# Patient Record
Sex: Male | Born: 1950
Health system: Southern US, Community
[De-identification: ages and names within clinical notes are randomized; demographics above are authoritative.]

## PROBLEM LIST (undated history)

## (undated) DIAGNOSIS — E7800 Pure hypercholesterolemia, unspecified: Secondary | ICD-10-CM

## (undated) DIAGNOSIS — I10 Essential (primary) hypertension: Secondary | ICD-10-CM

## (undated) DIAGNOSIS — K219 Gastro-esophageal reflux disease without esophagitis: Secondary | ICD-10-CM

## (undated) DIAGNOSIS — E785 Hyperlipidemia, unspecified: Secondary | ICD-10-CM

## (undated) DIAGNOSIS — C61 Malignant neoplasm of prostate: Principal | ICD-10-CM

## (undated) DIAGNOSIS — M549 Dorsalgia, unspecified: Secondary | ICD-10-CM

## (undated) DIAGNOSIS — Z800 Family history of malignant neoplasm of digestive organs: Secondary | ICD-10-CM

## (undated) DIAGNOSIS — F1020 Alcohol dependence, uncomplicated: Secondary | ICD-10-CM

## (undated) DIAGNOSIS — N62 Hypertrophy of breast: Secondary | ICD-10-CM

## (undated) DIAGNOSIS — K760 Fatty (change of) liver, not elsewhere classified: Secondary | ICD-10-CM

## (undated) DIAGNOSIS — R739 Hyperglycemia, unspecified: Secondary | ICD-10-CM

## (undated) DIAGNOSIS — G8929 Other chronic pain: Secondary | ICD-10-CM

## (undated) DIAGNOSIS — K5790 Diverticulosis of intestine, part unspecified, without perforation or abscess without bleeding: Secondary | ICD-10-CM

## (undated) DIAGNOSIS — Z8669 Personal history of other diseases of the nervous system and sense organs: Secondary | ICD-10-CM

## (undated) DIAGNOSIS — C7951 Secondary malignant neoplasm of bone: Secondary | ICD-10-CM

## (undated) DIAGNOSIS — Z87828 Personal history of other (healed) physical injury and trauma: Secondary | ICD-10-CM

## (undated) DIAGNOSIS — M1990 Unspecified osteoarthritis, unspecified site: Secondary | ICD-10-CM

## (undated) HISTORY — DX: Malignant neoplasm of prostate: C61

## (undated) HISTORY — DX: Hypertrophy of breast: N62

## (undated) HISTORY — DX: Hyperglycemia, unspecified: R73.9

## (undated) HISTORY — DX: Alcohol dependence, uncomplicated: F10.20

## (undated) HISTORY — DX: Personal history of other diseases of the nervous system and sense organs: Z86.69

## (undated) HISTORY — DX: Fatty (change of) liver, not elsewhere classified: K76.0

## (undated) HISTORY — DX: Family history of malignant neoplasm of digestive organs: Z80.0

## (undated) HISTORY — DX: Personal history of other (healed) physical injury and trauma: Z87.828

## (undated) HISTORY — DX: Pure hypercholesterolemia, unspecified: E78.00

## (undated) HISTORY — PX: ESOPHAGOGASTRODUODENOSCOPY: SHX1529

## (undated) HISTORY — PX: HAND SURGERY: SHX662

## (undated) HISTORY — DX: Essential (primary) hypertension: I10

## (undated) HISTORY — PX: KNEE SURGERY: SHX244

## (undated) HISTORY — DX: Unspecified osteoarthritis, unspecified site: M19.90

## (undated) HISTORY — DX: Hyperlipidemia, unspecified: E78.5

## (undated) HISTORY — DX: Gastro-esophageal reflux disease without esophagitis: K21.9

---

## 1985-12-12 HISTORY — PX: ANKLE SURGERY: SHX546

## 2003-05-20 ENCOUNTER — Encounter: Payer: Self-pay | Admitting: Emergency Medicine

## 2003-05-20 ENCOUNTER — Emergency Department (HOSPITAL_COMMUNITY): Admission: EM | Admit: 2003-05-20 | Discharge: 2003-05-20 | Payer: Self-pay | Admitting: Emergency Medicine

## 2004-01-29 ENCOUNTER — Ambulatory Visit (HOSPITAL_COMMUNITY): Admission: RE | Admit: 2004-01-29 | Discharge: 2004-01-29 | Payer: Self-pay | Admitting: Pulmonary Disease

## 2004-07-21 ENCOUNTER — Ambulatory Visit (HOSPITAL_COMMUNITY): Admission: RE | Admit: 2004-07-21 | Discharge: 2004-07-21 | Payer: Self-pay | Admitting: Pulmonary Disease

## 2004-09-25 ENCOUNTER — Emergency Department (HOSPITAL_COMMUNITY): Admission: EM | Admit: 2004-09-25 | Discharge: 2004-09-26 | Payer: Self-pay | Admitting: Emergency Medicine

## 2004-11-10 ENCOUNTER — Emergency Department (HOSPITAL_COMMUNITY): Admission: EM | Admit: 2004-11-10 | Discharge: 2004-11-10 | Payer: Self-pay | Admitting: *Deleted

## 2005-10-12 HISTORY — PX: PROSTATE BIOPSY: SHX241

## 2005-11-02 ENCOUNTER — Ambulatory Visit (HOSPITAL_COMMUNITY): Admission: RE | Admit: 2005-11-02 | Discharge: 2005-11-02 | Payer: Self-pay | Admitting: Pulmonary Disease

## 2005-11-21 ENCOUNTER — Encounter (HOSPITAL_COMMUNITY): Admission: RE | Admit: 2005-11-21 | Discharge: 2005-11-21 | Payer: Self-pay | Admitting: Internal Medicine

## 2006-01-03 ENCOUNTER — Ambulatory Visit: Admission: RE | Admit: 2006-01-03 | Discharge: 2006-01-19 | Payer: Self-pay | Admitting: Radiation Oncology

## 2006-03-06 ENCOUNTER — Ambulatory Visit: Admission: RE | Admit: 2006-03-06 | Discharge: 2006-06-04 | Payer: Self-pay | Admitting: Radiation Oncology

## 2006-11-08 ENCOUNTER — Observation Stay (HOSPITAL_COMMUNITY): Admission: RE | Admit: 2006-11-08 | Discharge: 2006-11-09 | Payer: Self-pay | Admitting: General Surgery

## 2006-11-08 ENCOUNTER — Encounter (INDEPENDENT_AMBULATORY_CARE_PROVIDER_SITE_OTHER): Payer: Self-pay | Admitting: Specialist

## 2006-12-12 HISTORY — PX: COLONOSCOPY: SHX174

## 2007-02-08 ENCOUNTER — Ambulatory Visit: Payer: Self-pay | Admitting: Internal Medicine

## 2007-02-19 ENCOUNTER — Ambulatory Visit: Payer: Self-pay | Admitting: Internal Medicine

## 2007-02-19 ENCOUNTER — Ambulatory Visit (HOSPITAL_COMMUNITY): Admission: RE | Admit: 2007-02-19 | Discharge: 2007-02-19 | Payer: Self-pay | Admitting: Internal Medicine

## 2007-06-29 ENCOUNTER — Emergency Department (HOSPITAL_COMMUNITY): Admission: EM | Admit: 2007-06-29 | Discharge: 2007-06-29 | Payer: Self-pay | Admitting: Emergency Medicine

## 2007-07-06 ENCOUNTER — Emergency Department (HOSPITAL_COMMUNITY): Admission: EM | Admit: 2007-07-06 | Discharge: 2007-07-06 | Payer: Self-pay | Admitting: Emergency Medicine

## 2007-11-01 ENCOUNTER — Ambulatory Visit (HOSPITAL_COMMUNITY): Admission: RE | Admit: 2007-11-01 | Discharge: 2007-11-01 | Payer: Self-pay | Admitting: Pulmonary Disease

## 2008-02-22 ENCOUNTER — Ambulatory Visit (HOSPITAL_COMMUNITY): Admission: RE | Admit: 2008-02-22 | Discharge: 2008-02-22 | Payer: Self-pay | Admitting: Internal Medicine

## 2008-06-23 ENCOUNTER — Ambulatory Visit (HOSPITAL_COMMUNITY): Admission: RE | Admit: 2008-06-23 | Discharge: 2008-06-23 | Payer: Self-pay | Admitting: Pulmonary Disease

## 2008-09-01 ENCOUNTER — Ambulatory Visit (HOSPITAL_COMMUNITY): Admission: RE | Admit: 2008-09-01 | Discharge: 2008-09-01 | Payer: Self-pay | Admitting: Pulmonary Disease

## 2008-11-11 LAB — HM COLONOSCOPY

## 2008-12-12 HISTORY — PX: HERNIA REPAIR: SHX51

## 2009-09-11 ENCOUNTER — Ambulatory Visit (HOSPITAL_COMMUNITY): Admission: RE | Admit: 2009-09-11 | Discharge: 2009-09-11 | Payer: Self-pay | Admitting: Pulmonary Disease

## 2009-09-11 ENCOUNTER — Encounter: Payer: Self-pay | Admitting: Orthopedic Surgery

## 2009-10-05 ENCOUNTER — Telehealth: Payer: Self-pay | Admitting: Orthopedic Surgery

## 2009-10-05 ENCOUNTER — Ambulatory Visit: Payer: Self-pay | Admitting: Orthopedic Surgery

## 2009-10-05 DIAGNOSIS — S139XXA Sprain of joints and ligaments of unspecified parts of neck, initial encounter: Secondary | ICD-10-CM | POA: Insufficient documentation

## 2009-10-05 DIAGNOSIS — M5020 Other cervical disc displacement, unspecified cervical region: Secondary | ICD-10-CM | POA: Insufficient documentation

## 2009-10-06 ENCOUNTER — Encounter: Payer: Self-pay | Admitting: Orthopedic Surgery

## 2009-11-02 ENCOUNTER — Ambulatory Visit: Payer: Self-pay | Admitting: Orthopedic Surgery

## 2009-11-03 ENCOUNTER — Encounter: Payer: Self-pay | Admitting: Orthopedic Surgery

## 2009-11-10 ENCOUNTER — Ambulatory Visit (HOSPITAL_COMMUNITY): Admission: RE | Admit: 2009-11-10 | Discharge: 2009-11-10 | Payer: Self-pay | Admitting: Orthopedic Surgery

## 2009-11-17 ENCOUNTER — Encounter (INDEPENDENT_AMBULATORY_CARE_PROVIDER_SITE_OTHER): Payer: Self-pay | Admitting: *Deleted

## 2009-11-17 ENCOUNTER — Encounter: Payer: Self-pay | Admitting: Orthopedic Surgery

## 2009-11-18 ENCOUNTER — Telehealth: Payer: Self-pay | Admitting: Orthopedic Surgery

## 2009-11-19 ENCOUNTER — Telehealth: Payer: Self-pay | Admitting: Orthopedic Surgery

## 2009-11-19 ENCOUNTER — Encounter: Payer: Self-pay | Admitting: Orthopedic Surgery

## 2009-12-14 ENCOUNTER — Encounter: Payer: Self-pay | Admitting: Orthopedic Surgery

## 2009-12-15 ENCOUNTER — Encounter: Admission: RE | Admit: 2009-12-15 | Discharge: 2009-12-15 | Payer: Self-pay | Admitting: Neurosurgery

## 2010-07-06 ENCOUNTER — Encounter (HOSPITAL_COMMUNITY): Admission: RE | Admit: 2010-07-06 | Discharge: 2010-08-05 | Payer: Self-pay | Admitting: Oncology

## 2010-07-06 ENCOUNTER — Ambulatory Visit (HOSPITAL_COMMUNITY): Payer: Self-pay | Admitting: Oncology

## 2010-07-20 ENCOUNTER — Ambulatory Visit (HOSPITAL_COMMUNITY): Payer: Self-pay | Admitting: Oncology

## 2010-10-12 ENCOUNTER — Encounter (HOSPITAL_COMMUNITY)
Admission: RE | Admit: 2010-10-12 | Discharge: 2010-11-11 | Payer: Self-pay | Source: Home / Self Care | Admitting: Oncology

## 2010-10-12 ENCOUNTER — Ambulatory Visit (HOSPITAL_COMMUNITY): Payer: Self-pay | Admitting: Oncology

## 2010-11-15 ENCOUNTER — Ambulatory Visit: Payer: Self-pay | Admitting: Internal Medicine

## 2010-11-19 ENCOUNTER — Ambulatory Visit (HOSPITAL_COMMUNITY)
Admission: RE | Admit: 2010-11-19 | Discharge: 2010-11-19 | Payer: Self-pay | Source: Home / Self Care | Attending: Internal Medicine | Admitting: Internal Medicine

## 2010-11-19 ENCOUNTER — Ambulatory Visit: Payer: Self-pay | Admitting: Internal Medicine

## 2010-11-30 ENCOUNTER — Encounter (HOSPITAL_COMMUNITY)
Admission: RE | Admit: 2010-11-30 | Discharge: 2010-12-30 | Payer: Self-pay | Source: Home / Self Care | Attending: Oncology | Admitting: Oncology

## 2010-11-30 ENCOUNTER — Ambulatory Visit (HOSPITAL_COMMUNITY): Payer: Self-pay | Admitting: Oncology

## 2010-12-31 ENCOUNTER — Encounter (HOSPITAL_COMMUNITY)
Admission: RE | Admit: 2010-12-31 | Discharge: 2011-01-11 | Payer: Self-pay | Source: Home / Self Care | Attending: Oncology | Admitting: Oncology

## 2011-01-01 ENCOUNTER — Encounter: Payer: Self-pay | Admitting: Pulmonary Disease

## 2011-01-11 LAB — PSA: PSA: 5.64 ng/mL — ABNORMAL HIGH (ref <=4.00)

## 2011-01-11 NOTE — Consult Note (Signed)
Summary: Consult [facility] Dr Jeral Fruit   Imported By: Cammie Sickle 01/07/2010 09:10:00  _____________________________________________________________________  External Attachment:    Type:   Image     Comment:   External Document

## 2011-01-24 ENCOUNTER — Emergency Department (HOSPITAL_COMMUNITY)
Admission: EM | Admit: 2011-01-24 | Discharge: 2011-01-24 | Disposition: A | Discharge status: Home / Self Care | Payer: PRIVATE HEALTH INSURANCE | Attending: Emergency Medicine | Admitting: Emergency Medicine

## 2011-01-24 DIAGNOSIS — Z8546 Personal history of malignant neoplasm of prostate: Secondary | ICD-10-CM | POA: Insufficient documentation

## 2011-01-24 DIAGNOSIS — I10 Essential (primary) hypertension: Secondary | ICD-10-CM | POA: Insufficient documentation

## 2011-01-24 DIAGNOSIS — R319 Hematuria, unspecified: Secondary | ICD-10-CM | POA: Insufficient documentation

## 2011-01-24 DIAGNOSIS — Z79899 Other long term (current) drug therapy: Secondary | ICD-10-CM | POA: Insufficient documentation

## 2011-01-24 LAB — URINALYSIS, ROUTINE W REFLEX MICROSCOPIC
Bilirubin Urine: NEGATIVE
Ketones, ur: NEGATIVE mg/dL
Leukocytes, UA: NEGATIVE
Nitrite: NEGATIVE
Protein, ur: NEGATIVE mg/dL
Specific Gravity, Urine: 1.02 (ref 1.005–1.030)
Urine Glucose, Fasting: NEGATIVE mg/dL
Urobilinogen, UA: 0.2 mg/dL (ref 0.0–1.0)
pH: 5 (ref 5.0–8.0)

## 2011-01-24 LAB — POCT I-STAT, CHEM 8
BUN: 25 mg/dL — ABNORMAL HIGH (ref 6–23)
Calcium, Ion: 1.17 mmol/L (ref 1.12–1.32)
Chloride: 103 meq/L (ref 96–112)
Creatinine, Ser: 1 mg/dL (ref 0.4–1.5)
Glucose, Bld: 89 mg/dL (ref 70–99)
HCT: 39 % (ref 39.0–52.0)
Hemoglobin: 13.3 g/dL (ref 13.0–17.0)
Potassium: 3.7 meq/L (ref 3.5–5.1)
Sodium: 137 mEq/L (ref 135–145)
TCO2: 27 mmol/L (ref 0–100)

## 2011-01-24 LAB — URINE MICROSCOPIC-ADD ON

## 2011-01-26 LAB — URINE CULTURE
Colony Count: NO GROWTH
Culture  Setup Time: 201202142328
Culture: NO GROWTH

## 2011-02-01 ENCOUNTER — Encounter (HOSPITAL_COMMUNITY): Payer: PRIVATE HEALTH INSURANCE | Attending: Oncology

## 2011-02-01 ENCOUNTER — Other Ambulatory Visit (HOSPITAL_COMMUNITY): Payer: PRIVATE HEALTH INSURANCE

## 2011-02-01 DIAGNOSIS — C61 Malignant neoplasm of prostate: Secondary | ICD-10-CM

## 2011-02-08 ENCOUNTER — Ambulatory Visit (HOSPITAL_COMMUNITY): Payer: Self-pay | Admitting: Oncology

## 2011-02-08 ENCOUNTER — Ambulatory Visit (HOSPITAL_COMMUNITY): Payer: PRIVATE HEALTH INSURANCE | Admitting: Oncology

## 2011-02-08 DIAGNOSIS — C61 Malignant neoplasm of prostate: Secondary | ICD-10-CM

## 2011-02-21 LAB — PSA: PSA: 5.71 ng/mL — ABNORMAL HIGH (ref <=4.00)

## 2011-02-22 LAB — PSA: PSA: 5 ng/mL — ABNORMAL HIGH (ref <=4.00)

## 2011-02-26 LAB — COMPREHENSIVE METABOLIC PANEL
ALT: 47 U/L (ref 0–53)
AST: 31 U/L (ref 0–37)
Alkaline Phosphatase: 55 U/L (ref 39–117)
CO2: 25 mEq/L (ref 19–32)
Chloride: 104 mEq/L (ref 96–112)
GFR calc Af Amer: >60 mL/min (ref >60)
GFR calc non Af Amer: >60 mL/min (ref >60)
Glucose, Bld: 124 mg/dL — ABNORMAL HIGH (ref 70–99)
Potassium: 3.1 mEq/L — ABNORMAL LOW (ref 3.5–5.1)
Sodium: 138 mEq/L (ref 135–145)

## 2011-02-26 LAB — CBC
HCT: 41.9 % (ref 39.0–52.0)
Hemoglobin: 14.3 g/dL (ref 13.0–17.0)
RBC: 4.5 MIL/uL (ref 4.22–5.81)
WBC: 7.9 10*3/uL (ref 4.0–10.5)

## 2011-02-26 LAB — LACTATE DEHYDROGENASE: LDH: 180 U/L (ref 94–250)

## 2011-03-01 ENCOUNTER — Encounter (HOSPITAL_COMMUNITY): Payer: PRIVATE HEALTH INSURANCE | Attending: Oncology

## 2011-03-01 ENCOUNTER — Other Ambulatory Visit (HOSPITAL_COMMUNITY): Payer: PRIVATE HEALTH INSURANCE

## 2011-03-01 DIAGNOSIS — C61 Malignant neoplasm of prostate: Secondary | ICD-10-CM | POA: Insufficient documentation

## 2011-03-22 ENCOUNTER — Emergency Department (HOSPITAL_COMMUNITY): Payer: PRIVATE HEALTH INSURANCE

## 2011-03-22 ENCOUNTER — Emergency Department (HOSPITAL_COMMUNITY)
Admission: EM | Admit: 2011-03-22 | Discharge: 2011-03-22 | Disposition: A | Discharge status: Home / Self Care | Payer: PRIVATE HEALTH INSURANCE | Attending: Emergency Medicine | Admitting: Emergency Medicine

## 2011-03-22 DIAGNOSIS — R9431 Abnormal electrocardiogram [ECG] [EKG]: Secondary | ICD-10-CM | POA: Insufficient documentation

## 2011-03-22 DIAGNOSIS — I10 Essential (primary) hypertension: Secondary | ICD-10-CM | POA: Insufficient documentation

## 2011-03-22 DIAGNOSIS — R079 Chest pain, unspecified: Secondary | ICD-10-CM | POA: Insufficient documentation

## 2011-03-22 DIAGNOSIS — Z8546 Personal history of malignant neoplasm of prostate: Secondary | ICD-10-CM | POA: Insufficient documentation

## 2011-03-22 DIAGNOSIS — R091 Pleurisy: Secondary | ICD-10-CM | POA: Insufficient documentation

## 2011-03-22 LAB — D-DIMER, QUANTITATIVE: D-Dimer, Quant: 0.45 ug/mL-FEU (ref 0.00–0.48)

## 2011-03-22 LAB — DIFFERENTIAL
Basophils Absolute: 0.1 10*3/uL (ref 0.0–0.1)
Eosinophils Relative: 2 % (ref 0–5)
Lymphs Abs: 3.1 10*3/uL (ref 0.7–4.0)
Monocytes Absolute: 0.8 10*3/uL (ref 0.1–1.0)

## 2011-03-22 LAB — BASIC METABOLIC PANEL
BUN: 27 mg/dL — ABNORMAL HIGH (ref 6–23)
Calcium: 10.3 mg/dL (ref 8.4–10.5)
GFR calc non Af Amer: >60 mL/min (ref >60)
Glucose, Bld: 84 mg/dL (ref 70–99)
Potassium: 4.3 mEq/L (ref 3.5–5.1)
Sodium: 138 mEq/L (ref 135–145)

## 2011-03-22 LAB — CBC
Hemoglobin: 12.9 g/dL — ABNORMAL LOW (ref 13.0–17.0)
MCH: 32.2 pg (ref 26.0–34.0)
Platelets: 419 10*3/uL — ABNORMAL HIGH (ref 150–400)
RBC: 4.01 MIL/uL — ABNORMAL LOW (ref 4.22–5.81)
WBC: 9.4 10*3/uL (ref 4.0–10.5)

## 2011-03-22 LAB — POCT CARDIAC MARKERS
CKMB, poc: 1.2 ng/mL (ref 1.0–8.0)
Myoglobin, poc: 73.1 ng/mL (ref 12–200)
Troponin i, poc: <0.05 ng/mL (ref 0.00–0.09)

## 2011-03-28 ENCOUNTER — Encounter (HOSPITAL_COMMUNITY): Payer: PRIVATE HEALTH INSURANCE | Attending: Oncology

## 2011-03-28 DIAGNOSIS — C61 Malignant neoplasm of prostate: Secondary | ICD-10-CM | POA: Insufficient documentation

## 2011-03-29 ENCOUNTER — Encounter (HOSPITAL_COMMUNITY): Payer: PRIVATE HEALTH INSURANCE | Admitting: Oncology

## 2011-03-29 DIAGNOSIS — C61 Malignant neoplasm of prostate: Secondary | ICD-10-CM

## 2011-04-26 ENCOUNTER — Other Ambulatory Visit (HOSPITAL_COMMUNITY): Payer: Self-pay | Admitting: Oncology

## 2011-04-26 ENCOUNTER — Encounter (HOSPITAL_COMMUNITY): Payer: PRIVATE HEALTH INSURANCE | Attending: Oncology

## 2011-04-26 DIAGNOSIS — C61 Malignant neoplasm of prostate: Secondary | ICD-10-CM

## 2011-04-26 LAB — COMPREHENSIVE METABOLIC PANEL
ALT: 54 U/L — ABNORMAL HIGH (ref 0–53)
AST: 36 U/L (ref 0–37)
Albumin: 3.7 g/dL (ref 3.5–5.2)
Alkaline Phosphatase: 47 U/L (ref 39–117)
BUN: 22 mg/dL (ref 6–23)
Chloride: 100 mEq/L (ref 96–112)
Potassium: 4.5 mEq/L (ref 3.5–5.1)
Sodium: 139 mEq/L (ref 135–145)
Total Bilirubin: 0.4 mg/dL (ref 0.3–1.2)
Total Protein: 6.7 g/dL (ref 6.0–8.3)

## 2011-04-29 NOTE — Op Note (Signed)
Walter Boone, Walter Boone            ACCOUNT NO.:  [id number]   MEDICAL RECORD NO.:  [id number]          PATIENT TYPE:  AMB   LOCATION:  DAY                           FACILITY:  [facility]   PHYSICIAN:  Lionel December, M.D.    DATE OF BIRTH:  1951/05/18   DATE OF PROCEDURE:  02/19/2007  DATE OF DISCHARGE:                               OPERATIVE REPORT   PROCEDURE:  Colonoscopy.   INDICATIONS:  This is a 60 year old Hispanic American male with  intermittent hematochezia and perianal burning.  It is suspected that he  has radiation proctitis and has had partial response to symptomatic  therapy.  He has history of prostate carcinoma and finished radiation  therapy in July 2007.  Furthermore his family history is positive for  colon carcinoma in two siblings.  This procedure and risks were reviewed  with the patient and informed consent was obtained.   MEDICATIONS FOR CONSCIOUS SEDATION:  Benzocaine spray for pharyngeal  topical anesthesia, Demerol 50 mg IV, Versed 8 mg IV in divided dose.   FINDINGS:  Procedure performed in endoscopy suite.  The patient's vital  signs and O2 sat were monitored during the procedure and remained  stable.  The patient was placed in the left lateral position and rectal  examination performed.  No abnormality noted on external or digital  exam.  Pentax videoscope was placed in the rectum and advanced under  vision into sigmoid colon beyond.  Preparation was excellent.  There  were a few small diverticula noted at sigmoid colon.  Scope was easily  passed into the cecum which was identified by appendiceal orifice and  ileocecal valve.  As the scope was withdrawn the colonic mucosa was  carefully examined. Pictures taken for the record.  Short segment of TI  was also examined and was normal.  As the scope was withdrawn colonic  mucosa was carefully examined and was normal throughout.  Mucosa of  proximal rectum was normal.  Distally there was a large patch with  pale  mucosa with telangiectasia.  However, there was no ulceration noted.  Anorectal junction appeared to be unremarkable.  Pictures were taken for  the record.  Endoscope was then straightened and withdrawn.  The patient  tolerated the procedure well.   FINAL DIAGNOSIS:  Mild distal proctitis felt to be the source of the  patient's intermittent hematochezia.  A few small diverticula at sigmoid  colon.  Normal to terminal ileum.   RECOMMENDATIONS:  High-fiber diet plus fiber supplement 3-4 grams daily.  He can use Mycolog cream or Anusol-HC suppository on p.r.n. basis.  Should he need to use one of the other prep, he should use at least for  one week at a time.   He should return for another screening exam in 5 years from now given  positive family history.      Lionel December, M.D.  Electronically Signed     NR/MEDQ  D:  02/19/2007  T:  02/19/2007  Job:  [id number]   cc:   Maryln Gottron, M.D.  Fax: [phone]   Dr. Juanetta Gosling

## 2011-04-29 NOTE — Consult Note (Signed)
NAMEVENCE, LALOR            ACCOUNT NO.:  [id number]   MEDICAL RECORD NO.:  [id number]          PATIENT TYPE:  AMB   LOCATION:                                FACILITY:  [facility]   PHYSICIAN:  Lionel December, M.D.    DATE OF BIRTH:  1951/01/30   DATE OF CONSULTATION:  02/08/2007  DATE OF DISCHARGE:                                 CONSULTATION   CONSULTING PHYSICIAN:  Chipper Herb, M.D.   PRIMARY CARE PHYSICIAN:  Edward L. Juanetta Gosling, M.D.   PRESENTING COMPLAINT:  Rectal bleeding, rectal burning and pruritus ani.   Positive family history of colon carcinoma.   HISTORY OF PRESENT ILLNESS:  Jedediah is a 60 year old Hispanic male who  is referred through the courtesy of Dr. Chipper Herb for GI evaluation.  Last year he had an insurance physical and was noted to have a PSA of  around 30.  He was seen by Dr. Rito Ehrlich and was diagnosed with stage-IIC  prostate carcinoma.  This was treated with radiation therapy which she  completed on June 23, 2006.  Ever since he has had intermittent rectal  burning and itching, and he also has had hematochezia.  On two occasions  he noted water in the commode colored red.  He denies abdominal pain,  diarrhea or constipation.  On most days he has soft formed stools.  He  remains with good appetite and his weight is stable.  He has been having  vasomotor symptoms with [name] for which he is using megestrol on p.r.n.  basis.  He denies heartburn, dysphagia, nausea or vomiting.  He states  his sister had colon carcinoma at age 43 and died at age 44, but  apparently he has never had a colonoscopy.  His other sister who lives  in [place] was diagnosed with colon carcinoma but apparently has not been  treated.  I asked him to call her and make sure this diagnosis is  correct.  The patient states that his PSA has been coming down, and the  last was down to 4.4.   He is on Prostate Essentials Plus b.i.d. (OTC), Hyzaar 100/25 daily,  Flomax 0.4 mg daily,  megestrol AC 40 mg daily p.r.n. hydrocortisone  cream or suppository p.r.n. and [name] injection every 3 months that is  being supervised by Dr. Dennie Maizes   PAST MEDICAL HISTORY:  Has been hypertensive for 5-6 years.  His blood  pressure has been well controlled.  History of hyperlipidemia.  He was  on Lipitor, but he could not afford all these medicines.  He plans to  see Dr. Juanetta Gosling and go back on this medicine.  Maybe he could try an  alternative.  History of prostate carcinoma treated with radiation  therapy as above.  He had surgery on his left knee for what appears to  be infection secondary to foreign body over 20 years ago, and he had a  bone removed from his left ankle about 20 years ago (accessory bone).  He had a right inguinal herniorrhaphy in September 2007.   ALLERGIES:  NO KNOWN ALLERGIES.   FAMILY HISTORY:  Father was diabetic and had peripheral vascular disease  and died at age 32.  Mother is doing fair.  She has hypertension and  diabetes.  Karlin has 7 sisters and 1 brother living.  One sister died  of colon carcinoma at age 45 as above and a sister recently diagnosed  with colon carcinoma with details sketchy, apparently has declined  therapy.  One brother died of intracranial hemorrhage at age 5, and he  has one brother with CAD.   SOCIAL HISTORY:  He is married.  He has 4 children, one is 18 years old,  the others are grown up.  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  He smoked  2 to 2-1/2 packs of cigarettes per day for 10 years but quit over 15  years ago.  Similarly, he quit drinking alcohol at the same time.  He  used to drink too much on weekends.   PHYSICAL EXAMINATION:  Pleasant, well-developed, mildly obese, Hispanic  male who is in no acute distress.  He weighs 212-1/2 pounds.  He is 5  feet 5 inches tall.  Pulse 92 per minute, blood pressure 128/80,  temperature is 97.8.   Conjunctivae is pink.  Sclerae is nonicteric.  Oropharyngeal mucosa is normal.  He has a partial upper plate.  No neck  masses or thyromegaly noted.  CARDIAC EXAM:  Regular rhythm.  Normal S1 and S2.  No murmur or gallop  noted.  LUNGS:  Clear to auscultation.  ABDOMEN:  Symmetrical, soft and nontender.  Liver edge is easily  palpable 2 to 3 cm below RCM.  It is soft and nontender.  Spleen is not  palpable.  RECTAL:  Examination deferred until time of colonoscopy.  No clubbing or edema noted.   ASSESSMENT:  Walter Boone is a 60 year old male who was treated last year for  prostate carcinoma with radiation therapy which was completed in July  2007.  He is experiencing intermittent hematochezia, has rectal burning  and pruritus.  He has had a partial response to topical steroids.  I  agree that this is most likely radiation-induced proctitis and she would  improve with therapy.  However given family history of colon carcinoma  and personal history of prostate carcinoma, need to make sure we are not  dealing with polyps or another neoplasm.  This procedure should also  confirm whether or not he has radiation proctitis.  I agree with Dr.  Dayton Scrape completely that rectal biopsy is contraindicated for fear of a  fistula inducing a chronic nonhealing ulcer.   RECOMMENDATIONS:  1. He will undergo colonoscopy both for diagnostic and high-risk      screening in the near future at [facility]  I have reviewed the procedure      and risks with the patient, and he is agreeable.  2. He will go back on Anusol-HC suppository one per rectum at bedtime      for 2 weeks.  Prescription given.   Further recommendations will depend on colonoscopic findings.  May  consider treating him with topical mesalamine for a month or longer if  symptoms persist despite using topical steroid preparation.   We appreciate the opportunity to participate in the care of this  gentleman.     Lionel December, M.D.  Electronically  Signed     NR/MEDQ  D:  02/08/2007  T:  02/08/2007  Job:  [id number]   cc:  Maryln Gottron, M.D.  Fax: [phone]   Oneal Deputy. Juanetta Gosling, M.D.  Fax: [phone]

## 2011-04-29 NOTE — Op Note (Signed)
Walter Boone, Walter Boone            ACCOUNT NO.:  [id number]   MEDICAL RECORD NO.:  [id number]          PATIENT TYPE:  AMB   LOCATION:  DAY                           FACILITY:  [facility]   PHYSICIAN:  Barbaraann Barthel, M.D. DATE OF BIRTH:  21-Mar-1951   DATE OF PROCEDURE:  11/08/2006  DATE OF DISCHARGE:                               OPERATIVE REPORT   SURGEON:  Dr. Malvin Johns.   PRE AND POSTOPERATIVE DIAGNOSIS:  Right inguinal hernia.   PROCEDURE:  Right inguinal herniorrhaphy (modified McVay repair).   NOTE:  This is a 60 year old [place] male who presented with discomfort  in the right groin area was clinically not incarcerated, moderate size  right inguinal hernia.  We discussed surgery with him, discussing  complications not limited to but including bleeding, infection and  recurrence.  Informed consent was obtained.   GROSS OPERATIVE FINDINGS:  The patient had a lipomatous indirect hernia  component which was ligated and the redundant portion of which was  amputated and indirect inguinal hernia which was repaired as well.  The  patient had atrophic testicles secondary to his [name] therapy for  prostatic cancer.  Otherwise normal groin anatomy.   TECHNIQUE:  The patient was placed in supine position after the adequate  administration of spinal anesthesia.  The patient had a Foley catheter  placed aseptically and was prepped with Betadine solution and draped in  usual manner.  An incision was carried out between the anterior superior  iliac spine and pubic tubercle through skin, subcutaneous tissue to the  external oblique which was opened to the external ring.  The cord  structures were separated from the indirect hernia which was ligated and  divided doubly with 2-0 Bralon suture.  There was a direct hernia defect  in the inguinal canal with a weakness in the inguinal canal which was  repaired by suturing transversus abdominis and transversalis fascia to  Cooper's ligament and  Poupart's ligament with interrupted 2-0 Bralon  sutures.  Prior to suturing these tightly, a relaxing incision was  carried out.  I then used approximately 10 mL 0.5% Sensorcaine to help  for postoperative comfort.  The wound was then irrigated, the cord  structures were then returned to their anatomic position and the  external oblique was approximated with 3-0 Polysorb sutures.  Subcu was  irrigated and the skin was approximated with stapling device and sterile  dressing was applied.  Prior to closure all sponge, needle and  instrument counts were found to be correct.  Estimated blood loss was  minimal.  The patient received approximately 1000 mL of crystalloids  intraoperatively.  There were no complications.      Barbaraann Barthel, M.D.  Electronically Signed     WB/MEDQ  D:  11/08/2006  T:  11/08/2006  Job:  [id number]   cc:   Ramon Dredge L. Juanetta Gosling, M.D.  Fax: [phone]   Dennie Maizes, M.D.  Fax: [phone]

## 2011-05-31 ENCOUNTER — Other Ambulatory Visit (HOSPITAL_COMMUNITY): Payer: Self-pay | Admitting: Oncology

## 2011-05-31 ENCOUNTER — Encounter (HOSPITAL_COMMUNITY): Payer: PRIVATE HEALTH INSURANCE | Attending: Oncology

## 2011-05-31 DIAGNOSIS — C61 Malignant neoplasm of prostate: Secondary | ICD-10-CM

## 2011-05-31 LAB — COMPREHENSIVE METABOLIC PANEL
AST: 55 U/L — ABNORMAL HIGH (ref 0–37)
BUN: 26 mg/dL — ABNORMAL HIGH (ref 6–23)
CO2: 25 mEq/L (ref 19–32)
Calcium: 10.3 mg/dL (ref 8.4–10.5)
Chloride: 104 mEq/L (ref 96–112)
Creatinine, Ser: 1.02 mg/dL (ref 0.50–1.35)
GFR calc Af Amer: >60 mL/min (ref >60)
GFR calc non Af Amer: >60 mL/min (ref >60)
Glucose, Bld: 149 mg/dL — ABNORMAL HIGH (ref 70–99)
Total Bilirubin: 0.2 mg/dL — ABNORMAL LOW (ref 0.3–1.2)

## 2011-05-31 LAB — TSH: TSH: 1.494 u[IU]/mL (ref 0.350–4.500)

## 2011-06-01 LAB — PSA: PSA: 10.54 ng/mL — ABNORMAL HIGH (ref <=4.00)

## 2011-06-11 ENCOUNTER — Encounter (HOSPITAL_COMMUNITY): Payer: Self-pay

## 2011-06-20 ENCOUNTER — Other Ambulatory Visit (HOSPITAL_COMMUNITY): Payer: Self-pay | Admitting: Oncology

## 2011-06-20 ENCOUNTER — Encounter (HOSPITAL_COMMUNITY): Payer: Self-pay | Admitting: Oncology

## 2011-06-20 DIAGNOSIS — C61 Malignant neoplasm of prostate: Secondary | ICD-10-CM

## 2011-06-20 HISTORY — DX: Malignant neoplasm of prostate: C61

## 2011-06-28 ENCOUNTER — Encounter (HOSPITAL_COMMUNITY): Payer: PRIVATE HEALTH INSURANCE | Attending: Oncology

## 2011-06-28 ENCOUNTER — Other Ambulatory Visit (HOSPITAL_COMMUNITY): Payer: Self-pay | Admitting: Oncology

## 2011-06-28 DIAGNOSIS — C61 Malignant neoplasm of prostate: Secondary | ICD-10-CM | POA: Insufficient documentation

## 2011-06-28 LAB — PSA: PSA: 9.62 ng/mL — ABNORMAL HIGH (ref <=4.00)

## 2011-06-28 NOTE — Progress Notes (Signed)
Labs drawn today for psa 

## 2011-07-11 ENCOUNTER — Encounter (HOSPITAL_COMMUNITY): Payer: PRIVATE HEALTH INSURANCE

## 2011-07-11 ENCOUNTER — Ambulatory Visit (HOSPITAL_COMMUNITY): Payer: PRIVATE HEALTH INSURANCE | Admitting: Oncology

## 2011-08-02 ENCOUNTER — Emergency Department (HOSPITAL_COMMUNITY)
Admission: EM | Admit: 2011-08-02 | Discharge: 2011-08-03 | Disposition: A | Discharge status: Home / Self Care | Payer: PRIVATE HEALTH INSURANCE | Attending: Emergency Medicine | Admitting: Emergency Medicine

## 2011-08-02 ENCOUNTER — Encounter (HOSPITAL_COMMUNITY): Payer: PRIVATE HEALTH INSURANCE | Attending: Oncology | Admitting: Oncology

## 2011-08-02 ENCOUNTER — Encounter (HOSPITAL_COMMUNITY): Payer: Self-pay | Admitting: Oncology

## 2011-08-02 ENCOUNTER — Emergency Department (HOSPITAL_COMMUNITY): Payer: PRIVATE HEALTH INSURANCE

## 2011-08-02 VITALS — BP 126/84 | HR 67 | Temp 98.7°F | Wt 222.4 lb

## 2011-08-02 DIAGNOSIS — C61 Malignant neoplasm of prostate: Secondary | ICD-10-CM | POA: Insufficient documentation

## 2011-08-02 DIAGNOSIS — I10 Essential (primary) hypertension: Secondary | ICD-10-CM | POA: Insufficient documentation

## 2011-08-02 DIAGNOSIS — S52309A Unspecified fracture of shaft of unspecified radius, initial encounter for closed fracture: Secondary | ICD-10-CM | POA: Insufficient documentation

## 2011-08-02 DIAGNOSIS — R079 Chest pain, unspecified: Secondary | ICD-10-CM | POA: Insufficient documentation

## 2011-08-02 DIAGNOSIS — M79609 Pain in unspecified limb: Secondary | ICD-10-CM | POA: Insufficient documentation

## 2011-08-02 DIAGNOSIS — T07XXXA Unspecified multiple injuries, initial encounter: Secondary | ICD-10-CM | POA: Insufficient documentation

## 2011-08-02 DIAGNOSIS — M21939 Unspecified acquired deformity of unspecified forearm: Secondary | ICD-10-CM | POA: Insufficient documentation

## 2011-08-02 DIAGNOSIS — Z8546 Personal history of malignant neoplasm of prostate: Secondary | ICD-10-CM | POA: Insufficient documentation

## 2011-08-02 DIAGNOSIS — Z79899 Other long term (current) drug therapy: Secondary | ICD-10-CM | POA: Insufficient documentation

## 2011-08-02 DIAGNOSIS — M25439 Effusion, unspecified wrist: Secondary | ICD-10-CM | POA: Insufficient documentation

## 2011-08-02 DIAGNOSIS — M25539 Pain in unspecified wrist: Secondary | ICD-10-CM | POA: Insufficient documentation

## 2011-08-02 NOTE — Progress Notes (Signed)
CC:   Walter Boone, M.D. Walter Boone, M.D.  DIAGNOSIS:  Biochemical recurrence of his prostate cancer with a PSA in the 4 to 10 range.  It has been there for the last year.  He has gradually gone up but does fluctuate still.  His most recent value was under 10.  His value today is pending.  REVIEW OF SYSTEMS:  He is working full-time.  He feels good.  He does have BPH symptomatology at times for which he uses Flomax as needed.  I suggested that he might do better on a regular schedule, but he sometimes does not need it for up to a week at a time and he takes it for a couple of days and then it goes away.  He is going to do what he wants to do obviously, but I did suggest to him to try it least once a day to see if that helps.  He tries to cut down on its usage because of the cost.  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  He really does not hurt anywhere, feels good.  Moves his bowels well, eats well.  Appetite excellent.  PHYSICAL EXAMINATION:  Vital signs are stable as can be.  Otherwise he is alert.  He is oriented.  He has no adenopathy in the cervical, supraclavicular, infraclavicular, axillary or inguinal areas.  Abdomen: Obese, nontender without organomegaly.  Bowel sounds normal.  Lungs: Clear to auscultation and percussion.  He does have this large lipoma that is about 4 to 5 cm across at the base of the neck posteriorly.  He has no thyromegaly.  Heart:  Regular rhythm and rate without murmur, rub or gallop.  He has no peripheral edema.  So he looks great, and I think the idea is to keep him with a great quality of life.  We will continue to check his PSAs monthly, and I will see him in December.  I think the easiest thing to do should we need to do it is to restart Depo [name] which he only took for 3 months, and the last time he took it was in 2007.  I think that would be the first order of therapy, but he does not  want to take anything and I want to give him anything unless he has more criteria for symptoms or a change in his PSA rate of increase.  He is absolutely happy with this.  We will see him back as mentioned.    ______________________________ Ladona Horns. Mariel Sleet, MD ESN/MEDQ  D:  08/02/2011  T:  08/02/2011  Job:  [id number]

## 2011-08-02 NOTE — Patient Instructions (Signed)
[facility]  Discharge Instructions  RECOMMENDATIONS MADE BY THE CONSULTANT AND ANY TEST RESULTS WILL BE SENT TO YOUR REFERRING DOCTOR.     SPECIAL INSTRUCTIONS/FOLLOW-UP: psa level today. Return to clinic as scheduled.   I acknowledge that I have been informed and understand all the instructions given to me and received a copy. I do not have any more questions at this time, but understand that I may call the Specialty Clinic at [facility] at [phone] during business hours should I have any further questions or need assistance in obtaining follow-up care.    __________________________________________  _____________  __________ Signature of Patient or Authorized Representative            Date                   Time    __________________________________________ Nurse's Signature

## 2011-08-02 NOTE — Progress Notes (Signed)
This office note has been dictated.

## 2011-08-09 ENCOUNTER — Other Ambulatory Visit (HOSPITAL_COMMUNITY): Payer: Self-pay | Admitting: Pulmonary Disease

## 2011-08-09 DIAGNOSIS — M62838 Other muscle spasm: Secondary | ICD-10-CM

## 2011-08-10 ENCOUNTER — Ambulatory Visit (HOSPITAL_COMMUNITY)
Admission: RE | Admit: 2011-08-10 | Discharge: 2011-08-10 | Disposition: A | Discharge status: Home / Self Care | Payer: PRIVATE HEALTH INSURANCE | Source: Ambulatory Visit | Attending: Pulmonary Disease | Admitting: Pulmonary Disease

## 2011-08-10 DIAGNOSIS — M62838 Other muscle spasm: Secondary | ICD-10-CM

## 2011-08-10 DIAGNOSIS — R51 Headache: Secondary | ICD-10-CM | POA: Insufficient documentation

## 2011-08-10 DIAGNOSIS — S060X1A Concussion with loss of consciousness of 30 minutes or less, initial encounter: Secondary | ICD-10-CM | POA: Insufficient documentation

## 2011-08-25 ENCOUNTER — Encounter (HOSPITAL_COMMUNITY): Payer: PRIVATE HEALTH INSURANCE | Attending: Oncology

## 2011-08-25 DIAGNOSIS — C61 Malignant neoplasm of prostate: Secondary | ICD-10-CM | POA: Insufficient documentation

## 2011-08-25 NOTE — Progress Notes (Signed)
Labs drawn today for psa 

## 2011-11-01 ENCOUNTER — Other Ambulatory Visit (HOSPITAL_COMMUNITY): Payer: PRIVATE HEALTH INSURANCE

## 2011-11-10 ENCOUNTER — Encounter (HOSPITAL_COMMUNITY): Payer: PRIVATE HEALTH INSURANCE | Attending: Oncology

## 2011-11-10 DIAGNOSIS — C61 Malignant neoplasm of prostate: Secondary | ICD-10-CM | POA: Insufficient documentation

## 2011-11-10 NOTE — Progress Notes (Signed)
Walter Boone presented for labwork. Labs per MD order drawn via Peripheral Line 25 gauge needle inserted in rt  Arm.  Good blood return present. Procedure without incident.  Needle removed intact. Patient tolerated procedure well.

## 2011-11-11 LAB — PSA: PSA: 12.96 ng/mL — ABNORMAL HIGH (ref <=4.00)

## 2011-12-02 ENCOUNTER — Encounter (HOSPITAL_COMMUNITY): Payer: PRIVATE HEALTH INSURANCE | Attending: Oncology | Admitting: Oncology

## 2011-12-02 VITALS — BP 160/97 | HR 77 | Temp 97.6°F | Wt 226.0 lb

## 2011-12-02 DIAGNOSIS — M25519 Pain in unspecified shoulder: Secondary | ICD-10-CM | POA: Insufficient documentation

## 2011-12-02 DIAGNOSIS — C61 Malignant neoplasm of prostate: Secondary | ICD-10-CM | POA: Insufficient documentation

## 2011-12-02 MED ORDER — CYCLOBENZAPRINE HCL 10 MG PO TABS: 10.0000 mg | ORAL_TABLET | Freq: Three times a day (TID) | ORAL | Status: AC | PRN

## 2011-12-02 MED ORDER — TRAMADOL HCL 50 MG PO TABS: 50.0000 mg | ORAL_TABLET | Freq: Four times a day (QID) | ORAL | Status: AC | PRN

## 2011-12-02 NOTE — Progress Notes (Signed)
CC:   Ballard Russell, MD Darvin Neighbours, MD Dennie Maizes, M.D. Edward L. Juanetta Gosling, M.D.  DIAGNOSIS:  Biochemical recurrent prostate cancer with PSA that has now risen from 8.92 in February 2012 to 12.96 as of 11/10/2011.  The value from today is pending.  HISTORY:  In going back further, his PSA has risen from 3.81 in July 2011 to the value of 12.96 in November, but he still feels fine.  He actually fell off a horse about 3 months ago and hurt himself to the point that he was knocked unconscious.  He had a negative CT of his head, interestingly.  He recovered completely.  What he is complaining about today is right shoulder pain, arm pain, and cannot sleep at night. He has been evaluated as a turns out by Dr. Juanetta Gosling as well as Dr. Fuller Canada and also by an orthopedic person or neurosurgeon, he cannot remember which, in [place].  It appears that Dr. Romeo Apple and the doctor in [place] think that this is coming from neck disk degenerative joint disease.  He does not have anything for the pain other than he has some hydrocodone that he, I think, found from his wife's collection potentially.  He has had some Advil and Aleve, none of which helped.  He has taken as much as 3 Aleve at one time, which I have discouraged, and he has taken 800 mg of ibuprofen at one time without any real improvement.  He still does not sleep well at all.  He asked me for some recommendations and what I am going to do today is give him some Flexeril 10 mg t.i.d. as long he is not going to drive or get up on a horse.  He can have some Tramadol every 6-8 hours. We will see if that helps.  If he is not better, I think I need to refer him back to Dr. Juanetta Gosling, who is his primary care doctor.  In the meantime, I think we just need to watch him.  He is asymptomatic.  Unless his PSA rises to 20, we will just watch him.  Once he gets to 20, I think we need to re-scan with a bone scan and CT of the chest, abdomen,  and pelvis to see if we have anything else to evaluate.  I think with some new products on the market he certainly could be a candidate for chemotherapy, of course, since he has never had Taxotere- based therapy.  He has also never had abiraterone.  There is one other new agent on the market, which he is certainly a candidate for use of. So, we have new things available.  We just have to see what transpires. We will continue to do monthly PSA, but I think the goal is still palliative at this point in time and try to keep them as functional and asymptomatic would be the ideal.    ______________________________ Ladona Horns. Mariel Sleet, MD ESN/MEDQ  D:  12/02/2011  T:  12/02/2011  Job:  [id number]

## 2011-12-02 NOTE — Progress Notes (Signed)
This office note has been dictated.

## 2011-12-02 NOTE — Patient Instructions (Signed)
[facility]  Discharge Instructions  RECOMMENDATIONS MADE BY THE CONSULTANT AND ANY TEST RESULTS WILL BE SENT TO YOUR REFERRING DOCTOR.   EXAM FINDINGS BY MD TODAY AND SIGNS AND SYMPTOMS TO REPORT TO CLINIC OR PRIMARY MD:   Monthly PSAs  Return to Dr. Mariel Sleet in 4 months   I acknowledge that I have been informed and understand all the instructions given to me and received a copy. I do not have any more questions at this time, but understand that I may call the Specialty Clinic at [facility] at [phone] during business hours should I have any further questions or need assistance in obtaining follow-up care.    __________________________________________  _____________  __________ Signature of Patient or Authorized Representative            Date                   Time    __________________________________________ Nurse's Signature

## 2011-12-09 ENCOUNTER — Other Ambulatory Visit (HOSPITAL_COMMUNITY): Payer: Self-pay | Admitting: Oncology

## 2011-12-09 ENCOUNTER — Telehealth (HOSPITAL_COMMUNITY): Payer: Self-pay | Admitting: *Deleted

## 2011-12-09 DIAGNOSIS — C61 Malignant neoplasm of prostate: Secondary | ICD-10-CM

## 2011-12-09 MED ORDER — TAMSULOSIN HCL 0.4 MG PO CAPS: 0.4000 mg | ORAL_CAPSULE | Freq: Every day | ORAL | Status: DC

## 2012-02-06 ENCOUNTER — Other Ambulatory Visit (HOSPITAL_COMMUNITY): Payer: Self-pay | Admitting: Oncology

## 2012-02-06 ENCOUNTER — Encounter (HOSPITAL_COMMUNITY): Payer: PRIVATE HEALTH INSURANCE | Attending: Oncology

## 2012-02-06 DIAGNOSIS — C61 Malignant neoplasm of prostate: Secondary | ICD-10-CM

## 2012-02-06 DIAGNOSIS — M25519 Pain in unspecified shoulder: Secondary | ICD-10-CM | POA: Insufficient documentation

## 2012-02-06 LAB — CBC
Hemoglobin: 13.3 g/dL (ref 13.0–17.0)
Platelets: 346 10*3/uL (ref 150–400)
RBC: 4.19 MIL/uL — ABNORMAL LOW (ref 4.22–5.81)
WBC: 6.6 10*3/uL (ref 4.0–10.5)

## 2012-02-06 LAB — COMPREHENSIVE METABOLIC PANEL
ALT: 64 U/L — ABNORMAL HIGH (ref 0–53)
AST: 41 U/L — ABNORMAL HIGH (ref 0–37)
Alkaline Phosphatase: 47 U/L (ref 39–117)
CO2: 27 mEq/L (ref 19–32)
Calcium: 9.5 mg/dL (ref 8.4–10.5)
Chloride: 106 mEq/L (ref 96–112)
GFR calc non Af Amer: >90 mL/min (ref >90)
Potassium: 3.6 mEq/L (ref 3.5–5.1)
Sodium: 141 mEq/L (ref 135–145)
Total Bilirubin: 0.3 mg/dL (ref 0.3–1.2)

## 2012-02-06 LAB — PSA: PSA: 14.53 ng/mL — ABNORMAL HIGH (ref <=4.00)

## 2012-02-06 NOTE — Progress Notes (Signed)
Labs drawn today for cbc,cmp,psa 

## 2012-02-28 ENCOUNTER — Telehealth (HOSPITAL_COMMUNITY): Payer: Self-pay | Admitting: *Deleted

## 2012-02-28 ENCOUNTER — Other Ambulatory Visit (HOSPITAL_COMMUNITY): Payer: Self-pay | Admitting: Oncology

## 2012-02-28 DIAGNOSIS — C61 Malignant neoplasm of prostate: Secondary | ICD-10-CM

## 2012-02-28 MED ORDER — TAMSULOSIN HCL 0.4 MG PO CAPS: 0.4000 mg | ORAL_CAPSULE | Freq: Every day | ORAL | Status: DC

## 2012-04-03 ENCOUNTER — Encounter (HOSPITAL_COMMUNITY): Payer: PRIVATE HEALTH INSURANCE | Attending: Oncology | Admitting: Oncology

## 2012-04-03 ENCOUNTER — Telehealth (HOSPITAL_COMMUNITY): Payer: Self-pay | Admitting: *Deleted

## 2012-04-03 VITALS — BP 137/84 | HR 83 | Temp 98.1°F | Wt 226.2 lb

## 2012-04-03 DIAGNOSIS — R5383 Other fatigue: Secondary | ICD-10-CM | POA: Insufficient documentation

## 2012-04-03 DIAGNOSIS — R5381 Other malaise: Secondary | ICD-10-CM | POA: Insufficient documentation

## 2012-04-03 DIAGNOSIS — C61 Malignant neoplasm of prostate: Secondary | ICD-10-CM | POA: Insufficient documentation

## 2012-04-03 LAB — COMPREHENSIVE METABOLIC PANEL
AST: 35 U/L (ref 0–37)
Albumin: 4 g/dL (ref 3.5–5.2)
Alkaline Phosphatase: 54 U/L (ref 39–117)
BUN: 27 mg/dL — ABNORMAL HIGH (ref 6–23)
Chloride: 100 mEq/L (ref 96–112)
Creatinine, Ser: 0.98 mg/dL (ref 0.50–1.35)
Potassium: 3.4 mEq/L — ABNORMAL LOW (ref 3.5–5.1)
Total Protein: 7.6 g/dL (ref 6.0–8.3)

## 2012-04-03 LAB — PSA: PSA: 20.67 ng/mL — ABNORMAL HIGH (ref <=4.00)

## 2012-04-03 LAB — DIFFERENTIAL
Basophils Absolute: 0.1 10*3/uL (ref 0.0–0.1)
Basophils Relative: 1 % (ref 0–1)
Eosinophils Absolute: 0.5 10*3/uL (ref 0.0–0.7)
Monocytes Relative: 6 % (ref 3–12)
Neutro Abs: 4.3 10*3/uL (ref 1.7–7.7)
Neutrophils Relative %: 55 % (ref 43–77)

## 2012-04-03 LAB — CBC
Hemoglobin: 14.1 g/dL (ref 13.0–17.0)
MCH: 30.9 pg (ref 26.0–34.0)
MCHC: 33.6 g/dL (ref 30.0–36.0)
RDW: 12.5 % (ref 11.5–15.5)

## 2012-04-03 NOTE — Telephone Encounter (Signed)
Message copied by Dennie Maizes on Tue Apr 03, 2012  4:10 PM ------      Message from: Mariel Sleet, ERIC S      Created: Tue Apr 03, 2012  3:24 PM       kdur 10 meq # 30 one a day.

## 2012-04-03 NOTE — Progress Notes (Signed)
Problem #1 biochemical recurrence of prostate cancer with a PSA of 14.53 and was 7.32 on 03/29/2011. He denies any bone pain but still has his joint issues felt to be due to degenerative disc and joint disease of the neck.  He has severe weakness and fatigability he states now which has been present for the last several months. His appetite is great bowel function is great but he complains of no energy. He has no fevers no chills no sweats at night. Sexual function is diminishing. His wife complains that he snores and he wakes of at least 4 times a night to empty his bladder. I've encouraged him to take the Flomax twice a day. With his excess weight I think we need to do a sleep apnea study along with the snoring the fatigability etc.  His physical examination is stable with stable vital signs lungs are clear to auscultation and percussion the heart exam which is perfectly normal without murmur rub or gallop and a regular rhythm and rate and has no lymph nodes in the cervical subclavicular or infraclavicular areas. His abdomen is soft and nontender without hepatosplenomegaly has no leg edema  We will get a CBC and cmet today to make sure he is okay and set him up for a sleep apnea study with results to myself as well as Dr. Juanetta Gosling. We will continue monthly PSA level and I will see him in 3 months. I think he is getting somewhat depressed. We will see what the above-mentioned studies show first.

## 2012-04-03 NOTE — Patient Instructions (Signed)
Walter Boone  [id number] 03-09-1951 Dr. Glenford Peers  [facility]  Discharge Instructions  RECOMMENDATIONS MADE BY THE CONSULTANT AND ANY TEST RESULTS WILL BE SENT TO YOUR REFERRING DOCTOR.   EXAM FINDINGS BY MD TODAY AND SIGNS AND SYMPTOMS TO REPORT TO CLINIC OR PRIMARY MD: We will get you referred for a sleep apnea study here at [facility]. That department will call you to set up that appointment.  MEDICATIONS PRESCRIBED: None.   INSTRUCTIONS GIVEN AND DISCUSSED: Continue monthly PSA lab test.   SPECIAL INSTRUCTIONS/FOLLOW-UP: Return to clinic in 3 months to see MD.    I acknowledge that I have been informed and understand all the instructions given to me and received a copy. I do not have any more questions at this time, but understand that I may call the Specialty Clinic at [facility] at [phone] during business hours should I have any further questions or need assistance in obtaining follow-up care.    __________________________________________  _____________  __________ Signature of Patient or Authorized Representative            Date                   Time    __________________________________________ Nurse's Signature

## 2012-04-04 ENCOUNTER — Telehealth (HOSPITAL_COMMUNITY): Payer: Self-pay | Admitting: *Deleted

## 2012-04-04 NOTE — Telephone Encounter (Signed)
Message copied by Dennie Maizes on Wed Apr 04, 2012  1:26 PM ------      Message from: Mariel Sleet, ERIC S      Created: Tue Apr 03, 2012 11:47 AM       K+ at home??

## 2012-04-04 NOTE — Telephone Encounter (Signed)
Spoke with pt as below. RX called to [facility] in [place]. Pt verbalized understanding.

## 2012-04-04 NOTE — Telephone Encounter (Signed)
Message copied by Dennie Maizes on Wed Apr 04, 2012  1:21 PM ------      Message from: Mariel Sleet, ERIC S      Created: Tue Apr 03, 2012 11:47 AM       K+ at home??

## 2012-05-03 ENCOUNTER — Other Ambulatory Visit (HOSPITAL_COMMUNITY): Payer: Self-pay | Admitting: Pulmonary Disease

## 2012-05-03 ENCOUNTER — Ambulatory Visit (HOSPITAL_COMMUNITY)
Admission: RE | Admit: 2012-05-03 | Discharge: 2012-05-03 | Disposition: A | Discharge status: Home / Self Care | Payer: PRIVATE HEALTH INSURANCE | Source: Ambulatory Visit | Attending: Pulmonary Disease | Admitting: Pulmonary Disease

## 2012-05-03 DIAGNOSIS — C61 Malignant neoplasm of prostate: Secondary | ICD-10-CM

## 2012-05-03 DIAGNOSIS — I10 Essential (primary) hypertension: Secondary | ICD-10-CM | POA: Insufficient documentation

## 2012-05-03 DIAGNOSIS — M25519 Pain in unspecified shoulder: Secondary | ICD-10-CM

## 2012-05-03 DIAGNOSIS — M549 Dorsalgia, unspecified: Secondary | ICD-10-CM

## 2012-05-03 DIAGNOSIS — R0989 Other specified symptoms and signs involving the circulatory and respiratory systems: Secondary | ICD-10-CM | POA: Insufficient documentation

## 2012-06-27 ENCOUNTER — Encounter (HOSPITAL_COMMUNITY): Payer: PRIVATE HEALTH INSURANCE | Attending: Oncology

## 2012-06-27 DIAGNOSIS — R220 Localized swelling, mass and lump, head: Secondary | ICD-10-CM | POA: Insufficient documentation

## 2012-06-27 DIAGNOSIS — C61 Malignant neoplasm of prostate: Secondary | ICD-10-CM | POA: Insufficient documentation

## 2012-06-27 NOTE — Progress Notes (Signed)
Labs drawn today for psa 

## 2012-07-03 ENCOUNTER — Encounter (HOSPITAL_COMMUNITY): Payer: Self-pay | Admitting: Oncology

## 2012-07-03 ENCOUNTER — Encounter (HOSPITAL_COMMUNITY): Payer: Self-pay

## 2012-07-03 ENCOUNTER — Encounter (HOSPITAL_BASED_OUTPATIENT_CLINIC_OR_DEPARTMENT_OTHER): Payer: PRIVATE HEALTH INSURANCE | Admitting: Oncology

## 2012-07-03 VITALS — BP 127/87 | HR 73 | Temp 97.9°F | Wt 219.2 lb

## 2012-07-03 DIAGNOSIS — C61 Malignant neoplasm of prostate: Secondary | ICD-10-CM

## 2012-07-03 DIAGNOSIS — R220 Localized swelling, mass and lump, head: Secondary | ICD-10-CM

## 2012-07-03 LAB — DIFFERENTIAL
Basophils Absolute: 0 10*3/uL (ref 0.0–0.1)
Basophils Relative: 1 % (ref 0–1)
Eosinophils Absolute: 0.4 10*3/uL (ref 0.0–0.7)
Eosinophils Relative: 4 % (ref 0–5)
Monocytes Absolute: 0.5 10*3/uL (ref 0.1–1.0)

## 2012-07-03 LAB — CBC
HCT: 39 % (ref 39.0–52.0)
MCH: 32 pg (ref 26.0–34.0)
MCHC: 34.9 g/dL (ref 30.0–36.0)
MCV: 91.8 fL (ref 78.0–100.0)
RDW: 12.8 % (ref 11.5–15.5)

## 2012-07-03 LAB — COMPREHENSIVE METABOLIC PANEL
AST: 32 U/L (ref 0–37)
Albumin: 4 g/dL (ref 3.5–5.2)
CO2: 27 mEq/L (ref 19–32)
Calcium: 9.5 mg/dL (ref 8.4–10.5)
Creatinine, Ser: 1.04 mg/dL (ref 0.50–1.35)
GFR calc non Af Amer: 77 mL/min — ABNORMAL LOW (ref >90)
Total Protein: 7.4 g/dL (ref 6.0–8.3)

## 2012-07-03 NOTE — Patient Instructions (Addendum)
Walter Boone  [id number] [birth year] Dr. Glenford Peers   [facility]  Discharge Instructions  RECOMMENDATIONS MADE BY THE CONSULTANT AND ANY TEST RESULTS WILL BE SENT TO YOUR REFERRING DOCTOR.   EXAM FINDINGS BY MD TODAY AND SIGNS AND SYMPTOMS TO REPORT TO CLINIC OR PRIMARY MD: exam and discussion per MD.  Will check some labs today and do CT scans in August. Report any new lumps, bone pain or shortness of breath.  MEDICATIONS PRESCRIBED: none   INSTRUCTIONS GIVEN AND DISCUSSED:   SPECIAL INSTRUCTIONS/FOLLOW-UP: Lab work Needed today and monthly, Xray Studies Needed CT of Chest,Abdomen and Pelvis and Return to Clinic to see MD after scans.   I acknowledge that I have been informed and understand all the instructions given to me and received a copy. I do not have any more questions at this time, but understand that I may call the Specialty Clinic at [facility] at [phone] during business hours should I have any further questions or need assistance in obtaining follow-up care.    __________________________________________  _____________  __________ Signature of Patient or Authorized Representative            Date                   Time    __________________________________________ Nurse's Signature

## 2012-07-03 NOTE — Progress Notes (Signed)
Problem #1 recurrent prostate cancer manifested by a rising PSA level though in 2011 we did CT scans and bone scan which were negative. He now has some swelling around his head and neck. Night which he states he is having really for as long as he was ever treated with Depo-[name] for those 2 years after his surgery and ever since it was stopped he is still doing the sweating even though he denies to [place] in the past. He told us that he did not want to worry [place] about it. His appetite is excellent he still working full-time he stores but he does not want to pursue the sleep apnea study so that's why did not get done. He feels good overall still has some trouble urinating if he takes a pain pill during the night first right shoulder he sleeps through the night but if he does and he wakes up 3-4 times to urinate. Whether this is the urinary issue whether this is sleep apnea is not clear. He is clearly overweight for his height at 219 pounds on a 5 foot 6 inch frame. His weight has been stable since I first met him in July 2011.  His physical exam shows stable vital signs no lymphadenopathy in any location including supraclavicular infraclavicular axillary cervical epitrochlear and inguinal areas. He has no hepatosplenomegaly. His lungs are clear to auscultation and percussion. He has no tenderness over his ribs posteriorly or his spine. He has no thyromegaly. His heart shows a regular rhythm and rate without murmur rub or gallop. Bowel sounds are diminished but present and his prostate exam I think he has on rectal exam no obvious masses prostate tissue is minimal to a slight thickening at most at the base. He has no leg edema no arm edema.  I think it's x3 exam and him with CAT scans and a bone scan since his PSA is now up to 22. I think we can still do watchful waiting if all is clear or we could potentially treat him with Depo-[name] plus or minus cost of asked to see that shrinks any recurrent prostate cancer  tissue in the area of the prostate if indeed that's the only thing that is found on the scans. I will see him after the scans which we will schedule for August

## 2012-07-16 ENCOUNTER — Telehealth (HOSPITAL_COMMUNITY): Payer: Self-pay | Admitting: Oncology

## 2012-07-23 ENCOUNTER — Ambulatory Visit (HOSPITAL_COMMUNITY)
Admission: RE | Admit: 2012-07-23 | Discharge: 2012-07-23 | Disposition: A | Discharge status: Home / Self Care | Payer: PRIVATE HEALTH INSURANCE | Source: Ambulatory Visit | Attending: Oncology | Admitting: Oncology

## 2012-07-23 ENCOUNTER — Other Ambulatory Visit (HOSPITAL_COMMUNITY): Payer: Self-pay | Admitting: Oncology

## 2012-07-23 DIAGNOSIS — R918 Other nonspecific abnormal finding of lung field: Secondary | ICD-10-CM | POA: Insufficient documentation

## 2012-07-23 DIAGNOSIS — C61 Malignant neoplasm of prostate: Secondary | ICD-10-CM | POA: Insufficient documentation

## 2012-07-23 MED ORDER — IOHEXOL 300 MG/ML  SOLN
100.0000 mL | Freq: Once | INTRAMUSCULAR | Status: AC | PRN
  Administered 2012-07-23: 100 mL via INTRAVENOUS

## 2012-07-25 ENCOUNTER — Encounter (HOSPITAL_COMMUNITY): Payer: PRIVATE HEALTH INSURANCE | Attending: Oncology

## 2012-07-25 DIAGNOSIS — C61 Malignant neoplasm of prostate: Secondary | ICD-10-CM | POA: Insufficient documentation

## 2012-07-25 DIAGNOSIS — R911 Solitary pulmonary nodule: Secondary | ICD-10-CM | POA: Insufficient documentation

## 2012-07-25 LAB — PSA: PSA: 24.45 ng/mL — ABNORMAL HIGH (ref <=4.00)

## 2012-07-25 NOTE — Progress Notes (Signed)
Labs drawn today for psa 

## 2012-07-27 ENCOUNTER — Encounter (HOSPITAL_COMMUNITY)
Admission: RE | Admit: 2012-07-27 | Discharge: 2012-07-27 | Disposition: A | Discharge status: Home / Self Care | Payer: PRIVATE HEALTH INSURANCE | Source: Ambulatory Visit | Attending: Oncology | Admitting: Oncology

## 2012-07-27 ENCOUNTER — Encounter (HOSPITAL_COMMUNITY): Payer: Self-pay

## 2012-07-27 DIAGNOSIS — R972 Elevated prostate specific antigen [PSA]: Secondary | ICD-10-CM | POA: Insufficient documentation

## 2012-07-27 DIAGNOSIS — C61 Malignant neoplasm of prostate: Secondary | ICD-10-CM | POA: Insufficient documentation

## 2012-07-27 MED ORDER — TECHNETIUM TC 99M MEDRONATE IV KIT
25.0000 | PACK | Freq: Once | INTRAVENOUS | Status: AC | PRN
  Administered 2012-07-27: 26 via INTRAVENOUS

## 2012-08-06 ENCOUNTER — Encounter (HOSPITAL_BASED_OUTPATIENT_CLINIC_OR_DEPARTMENT_OTHER): Payer: PRIVATE HEALTH INSURANCE | Admitting: Oncology

## 2012-08-06 ENCOUNTER — Encounter (HOSPITAL_COMMUNITY): Payer: Self-pay | Admitting: Oncology

## 2012-08-06 VITALS — BP 138/88 | HR 75 | Temp 98.1°F | Resp 18 | Wt 210.6 lb

## 2012-08-06 DIAGNOSIS — C61 Malignant neoplasm of prostate: Secondary | ICD-10-CM

## 2012-08-06 DIAGNOSIS — R911 Solitary pulmonary nodule: Secondary | ICD-10-CM

## 2012-08-06 NOTE — Progress Notes (Signed)
Problem #1 recurrent cancer of the prostate biochemical in nature with no obvious bone metastases liver or lung metastases etc. He does have a single very small 20 nodule and with his history of smoking years ago 2 1/2 packs of cigarettes a day we will do a followup CT of the chest in 6-7 months.  Presently he is not symptomatic from the prostate cancer. His quality of life is very good except for the trouble urinating and when he does use the Flomax twice a day he urinates quite easily.  He did ask me about Provenge which he is not a candidate for this time. So we discussed the options which are to watch him as were doing but changes PSAs every 6 weeks to 8 weeks. The other option is to put him on Fosamax alone or to put him on Depo-[name] alone knowing that it will take away all sexual drive etc. He will discuss things with his wife but for now he thinks he wants to continue just as we are doing with the observation.  He was treated with radioactive seeds by Dr. Dayton Scrape 6 years ago and 2 years of Depo-[name] he states he will call us if his discussed with his wife changes.

## 2012-08-06 NOTE — Patient Instructions (Addendum)
Walter Boone  DOB 19-May-1951 CSN [id number]  MRN [id number] Dr. Glenford Peers   [facility]  Discharge Instructions  RECOMMENDATIONS MADE BY THE CONSULTANT AND ANY TEST RESULTS WILL BE SENT TO YOUR REFERRING DOCTOR.   EXAM FINDINGS BY MD TODAY AND SIGNS AND SYMPTOMS TO REPORT TO CLINIC OR PRIMARY MD: your PSA has only gone up slightly.  We can continue just to watch you or we could start the pill (casodex) and/or the shot (depo lupron).  Taking these will not cure you but may delay the development of bone metastasis.  If we start them it will decrease your sex drive significantly.  Talk with your wife and let us know if you want to do anything differently.  We will check your PSA every 6 months instead of every 4 months.  MEDICATIONS PRESCRIBED: none   INSTRUCTIONS GIVEN AND DISCUSSED: Other :  Report any new lumps, bone pain or shortness of breath.  SPECIAL INSTRUCTIONS/FOLLOW-UP: Lab work Needed every 6 weeks, Xray Studies Needed in March and Return to Clinic in 4 months for follow-up.   I acknowledge that I have been informed and understand all the instructions given to me and received a copy. I do not have any more questions at this time, but understand that I may call the Specialty Clinic at [facility] at [phone] during business hours should I have any further questions or need assistance in obtaining follow-up care.    __________________________________________  _____________  __________ Signature of Patient or Authorized Representative            Date                   Time    __________________________________________ Nurse's Signature

## 2012-08-07 ENCOUNTER — Ambulatory Visit (INDEPENDENT_AMBULATORY_CARE_PROVIDER_SITE_OTHER): Payer: PRIVATE HEALTH INSURANCE | Admitting: Internal Medicine

## 2012-08-07 ENCOUNTER — Encounter (INDEPENDENT_AMBULATORY_CARE_PROVIDER_SITE_OTHER): Payer: Self-pay | Admitting: Internal Medicine

## 2012-08-07 ENCOUNTER — Telehealth (INDEPENDENT_AMBULATORY_CARE_PROVIDER_SITE_OTHER): Payer: Self-pay | Admitting: *Deleted

## 2012-08-07 ENCOUNTER — Other Ambulatory Visit (INDEPENDENT_AMBULATORY_CARE_PROVIDER_SITE_OTHER): Payer: Self-pay | Admitting: *Deleted

## 2012-08-07 VITALS — BP 116/70 | HR 74 | Temp 97.4°F | Resp 18 | Ht 67.0 in | Wt 219.6 lb

## 2012-08-07 DIAGNOSIS — Z8489 Family history of other specified conditions: Secondary | ICD-10-CM

## 2012-08-07 DIAGNOSIS — Z800 Family history of malignant neoplasm of digestive organs: Secondary | ICD-10-CM

## 2012-08-07 DIAGNOSIS — Z1211 Encounter for screening for malignant neoplasm of colon: Secondary | ICD-10-CM

## 2012-08-07 NOTE — Progress Notes (Signed)
Presenting complaint;  Patient interested in colonoscopy. Family history positive for colorectal carcinoma.  History of present illness; Patient is 61 year old Hispanic male who is referred through courtesy of Dr. Mariel Sleet for colonoscopy. He has occasional hematochezia. He states his last colonoscopy was in 2008. He was seen by Dr. Barbaraann Barthel last year and told he had hemorrhoids. He denies abdominal pain, nausea, vomiting abdominal pain melena or frank rectal bleeding. He has a good appetite and denies anorexia or weight loss. He has history of prostate CA treated with radiation therapy and now his PSA has been rising. He saw Dr. Mariel Sleet recently and treatment options were considered. Patient has not made a final decision yet.  he had abdominopelvic CT as well as bone scan and these studies were negative for metastatic disease. Review of the systems is positive for right shoulder pain. He denies chest pain shortness of breath. Lately he has not experienced heartburn or regurgitation. He has been off PPI for several months.    Current Medications: Current Outpatient Prescriptions  Medication Sig Dispense Refill  . HYDROcodone-acetaminophen (NORCO) 10-325 MG per tablet Take 1 tablet by mouth as needed.        Marland Kitchen ibuprofen (ADVIL,MOTRIN) 800 MG tablet Take by mouth as needed.       . Olmesartan-Amlodipine-HCTZ (TRIBENZOR) 40-5-25 MG TABS Take 1 tablet by mouth daily.      . Tamsulosin HCl (FLOMAX) 0.4 MG CAPS Take 1 capsule (0.4 mg total) by mouth daily.  30 capsule  4   Past medical history; Hypertension of 12 years duration. History of prostate adenocarcinoma status post radiation therapy and treated in July 2007 and now with a rising PSA level. Right shoulder arthritis. History of GERD. EGD in December 2011 negative for erosive esophagitis but revealed H. pylori gastritis. H. pylori gastritis treated in December 2011. Last colonoscopy was in March 2008. He had few diverticula at  sigmoid colon and changes of radiation proctitis. Left ankle surgery to remove accessory bone when he was 61 years old. Had foreign body removed from his left knee about 25 years ago. Right inguinal hernioraphy in 2008. Allergies; NKA. Family history; Both parent's are deceased; mother had heart problems and died at 82 and father was diabetic and lived to be 38. He had 2 brothers and they both died at young age. 7 sisters are living and 2 are deceased. One sister died of muscular dystrophy at age 65 another sister died of metastatic colorectal carcinoma at age 84. And a sister was recently diagnosed with colon carcinoma and she is in her early 39s. Social history; He is married and has 4 healthy children. 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 He smoked cigarettes up to 2.5  packs a day for 4 years but quit 17 years ago. He does not drink alcohol.  Objective: Blood pressure 116/70, pulse 74, temperature 97.4 F (36.3 C), temperature source Oral, resp. rate 18, height 5\' 7"  (1.702 m), weight 219 lb 9.6 oz (99.61 kg). Patient is alert and in no acute distress. Conjunctiva is pink. Sclera is nonicteric Oropharyngeal mucosa is normal. He has partial upper plate. No neck masses or thyromegaly noted. Cardiac exam with regular rhythm normal S1 and S2. No murmur or gallop noted. Lungs are clear to auscultation. Abdomen is symmetrical. Bowel sounds are normal. Abdomen is soft and nontender without organomegaly or masses.  No LE edema or clubbing noted.  Labs/studies Results: Hemoccults from 14-Jul-2012 was negative. CBC from same date WBC 8.6, H&H 13.6 and 39 and platelet count 343K. LFTs from 07/14/12. Bilirubin 0.4, AP 53, AST 32, ALT 49, total protein 7.5 and albumin 4.0 Serum calcium 9.5. Electrolytes normal, glucose 112, BUN 26 and creatinine 1.04.   Assessment:  Patient is high risk for colorectal  carcinoma as 2 of his siblings had colon carcinoma in 1 of them is deceased. His last colonoscopy was in March 2008. Sporadic hematochezia possibly secondary to hemorrhoids or radiation proctitis.   Recommendations;  High risk screening colonoscopy to be scheduled in near future.  Addendum; Old records reviewed and patient's last colonoscopy was in December 2011. He could therefore wait 3 more years before his next colonoscopy. Will contact patient with new recommendations.

## 2012-08-07 NOTE — Patient Instructions (Addendum)
Colonoscopy to be scheduled. 

## 2012-08-07 NOTE — Telephone Encounter (Signed)
Patient needs movi prep 

## 2012-08-08 ENCOUNTER — Other Ambulatory Visit (HOSPITAL_COMMUNITY): Payer: Self-pay | Admitting: Oncology

## 2012-08-08 ENCOUNTER — Telehealth (HOSPITAL_COMMUNITY): Payer: Self-pay | Admitting: Oncology

## 2012-08-08 DIAGNOSIS — C61 Malignant neoplasm of prostate: Secondary | ICD-10-CM

## 2012-08-08 MED ORDER — BICALUTAMIDE 50 MG PO TABS: 50.0000 mg | ORAL_TABLET | Freq: Every day | ORAL | Status: AC

## 2012-08-08 MED ORDER — PEG-KCL-NACL-NASULF-NA ASC-C 100 G PO SOLR: 1.0000 | Freq: Once | ORAL | Status: DC

## 2012-08-09 ENCOUNTER — Telehealth (HOSPITAL_COMMUNITY): Payer: Self-pay | Admitting: *Deleted

## 2012-08-09 NOTE — Telephone Encounter (Signed)
Patient wants to start [name] and Casodex ASAP

## 2012-08-10 ENCOUNTER — Encounter (HOSPITAL_COMMUNITY): Payer: Self-pay | Admitting: Oncology

## 2012-08-10 ENCOUNTER — Other Ambulatory Visit (HOSPITAL_COMMUNITY): Payer: Self-pay | Admitting: Oncology

## 2012-08-10 DIAGNOSIS — C61 Malignant neoplasm of prostate: Secondary | ICD-10-CM

## 2012-08-10 HISTORY — DX: Malignant neoplasm of prostate: C61

## 2012-08-10 MED ORDER — BICALUTAMIDE 50 MG PO TABS: 50.0000 mg | ORAL_TABLET | Freq: Every day | ORAL | Status: DC

## 2012-08-15 ENCOUNTER — Telehealth (HOSPITAL_COMMUNITY): Payer: Self-pay

## 2012-08-15 ENCOUNTER — Encounter (HOSPITAL_COMMUNITY): Payer: PRIVATE HEALTH INSURANCE | Attending: Oncology

## 2012-08-15 ENCOUNTER — Other Ambulatory Visit (HOSPITAL_COMMUNITY): Payer: Self-pay

## 2012-08-15 VITALS — BP 130/87 | HR 118

## 2012-08-15 DIAGNOSIS — Z5111 Encounter for antineoplastic chemotherapy: Secondary | ICD-10-CM

## 2012-08-15 DIAGNOSIS — C61 Malignant neoplasm of prostate: Secondary | ICD-10-CM

## 2012-08-15 MED ORDER — LEUPROLIDE ACETATE (3 MONTH) 22.5 MG IM KIT
22.5000 mg | PACK | Freq: Once | INTRAMUSCULAR | Status: AC
  Administered 2012-08-15: 22.5 mg via INTRAMUSCULAR
  Filled 2012-08-15: qty 22.5

## 2012-08-15 NOTE — Progress Notes (Signed)
Tolerated injection well. 

## 2012-08-15 NOTE — Telephone Encounter (Signed)
Left patient message on voicemail that appointments with the exception of lab visit on 9/30 have been changed to reflect changes in therapy.  To call clinic back with any questions.

## 2012-08-15 NOTE — Telephone Encounter (Signed)
Message copied by Evelena Leyden on Wed Aug 15, 2012  2:50 PM ------      Message from: Mariel Sleet, ERIC S      Created: Wed Aug 15, 2012  1:59 PM       For now yes      ----- Message -----         From: Evelena Leyden, RN         Sent: 08/15/2012  12:29 PM           To: Randall An, MD            Are we switching his PSA to monthly instead of every 6 weeks?      ----- Message -----         From: Randall An, MD         Sent: 08/08/2012  12:38 PM           To: Evelena Leyden, RN            Casodex called in      Let him know we will need to see him after 2 psa levels from start of casodex

## 2012-08-24 ENCOUNTER — Ambulatory Visit (HOSPITAL_COMMUNITY)
Admission: RE | Admit: 2012-08-24 | Payer: PRIVATE HEALTH INSURANCE | Source: Ambulatory Visit | Admitting: Internal Medicine

## 2012-08-24 ENCOUNTER — Encounter (HOSPITAL_COMMUNITY): Admission: RE | Payer: Self-pay | Source: Ambulatory Visit

## 2012-08-24 SURGERY — COLONOSCOPY
Anesthesia: Moderate Sedation

## 2012-09-10 ENCOUNTER — Encounter (HOSPITAL_BASED_OUTPATIENT_CLINIC_OR_DEPARTMENT_OTHER): Payer: PRIVATE HEALTH INSURANCE

## 2012-09-10 DIAGNOSIS — C61 Malignant neoplasm of prostate: Secondary | ICD-10-CM

## 2012-09-10 NOTE — Progress Notes (Signed)
Labs drawn today for psa 

## 2012-09-11 ENCOUNTER — Other Ambulatory Visit (HOSPITAL_COMMUNITY): Payer: Self-pay | Admitting: Oncology

## 2012-09-11 DIAGNOSIS — R232 Flushing: Secondary | ICD-10-CM

## 2012-09-11 LAB — PSA: PSA: 3.69 ng/mL (ref <=4.00)

## 2012-09-11 MED ORDER — SERTRALINE HCL 25 MG PO TABS: 25.0000 mg | ORAL_TABLET | Freq: Every day | ORAL | Status: DC

## 2012-10-08 ENCOUNTER — Encounter (HOSPITAL_COMMUNITY): Payer: PRIVATE HEALTH INSURANCE | Attending: Oncology

## 2012-10-08 DIAGNOSIS — C61 Malignant neoplasm of prostate: Secondary | ICD-10-CM | POA: Insufficient documentation

## 2012-10-08 NOTE — Progress Notes (Signed)
Labs drawn today for psa 

## 2012-10-09 ENCOUNTER — Ambulatory Visit (HOSPITAL_COMMUNITY)
Admission: RE | Admit: 2012-10-09 | Discharge: 2012-10-09 | Disposition: A | Discharge status: Home / Self Care | Payer: PRIVATE HEALTH INSURANCE | Source: Ambulatory Visit | Attending: Pulmonary Disease | Admitting: Pulmonary Disease

## 2012-10-09 ENCOUNTER — Other Ambulatory Visit (HOSPITAL_COMMUNITY): Payer: Self-pay | Admitting: Pulmonary Disease

## 2012-10-09 DIAGNOSIS — M79605 Pain in left leg: Secondary | ICD-10-CM

## 2012-10-09 DIAGNOSIS — M79609 Pain in unspecified limb: Secondary | ICD-10-CM | POA: Insufficient documentation

## 2012-10-12 ENCOUNTER — Encounter (HOSPITAL_COMMUNITY): Payer: PRIVATE HEALTH INSURANCE | Attending: Oncology | Admitting: Oncology

## 2012-10-12 ENCOUNTER — Ambulatory Visit (HOSPITAL_COMMUNITY)
Admission: RE | Admit: 2012-10-12 | Discharge: 2012-10-12 | Disposition: A | Discharge status: Home / Self Care | Payer: PRIVATE HEALTH INSURANCE | Source: Ambulatory Visit | Attending: Oncology | Admitting: Oncology

## 2012-10-12 VITALS — BP 118/81 | HR 99 | Temp 97.7°F | Resp 16 | Wt 222.8 lb

## 2012-10-12 DIAGNOSIS — M79609 Pain in unspecified limb: Secondary | ICD-10-CM

## 2012-10-12 DIAGNOSIS — C61 Malignant neoplasm of prostate: Secondary | ICD-10-CM | POA: Insufficient documentation

## 2012-10-12 MED ORDER — VENLAFAXINE HCL 37.5 MG PO TABS: 37.5000 mg | ORAL_TABLET | Freq: Two times a day (BID) | ORAL | Status: DC

## 2012-10-12 NOTE — Patient Instructions (Addendum)
[facility]  Discharge Instructions  RECOMMENDATIONS MADE BY THE CONSULTANT AND ANY TEST RESULTS WILL BE SENT TO YOUR REFERRING DOCTOR.   EXAM FINDINGS BY MD TODAY AND SIGNS AND SYMPTOMS TO REPORT TO CLINIC OR PRIMARY MD:  Exam today per Dr. Mariel Sleet He is worried about your left leg and we will repeat dopplers today    Effexor 1 tablet twice a day for hot flashes. INSTRUCTIONS GIVEN AND DISCUSSED: Continue casodex and lupron shots  SPECIAL INSTRUCTIONS/FOLLOW-UP: 8 weeks to see Tom   I acknowledge that I have been informed and understand all the instructions given to me and received a copy. I do not have any more questions at this time, but understand that I may call the Specialty Clinic at [facility] at [phone] during business hours should I have any further questions or need assistance in obtaining follow-up care.    __________________________________________  _____________  __________ Signature of Patient or Authorized Representative            Date                   Time    __________________________________________ Nurse's Signature

## 2012-10-12 NOTE — Progress Notes (Signed)
Problem #1 recurrent prostate cancer with a rising PSA level now on Depo-[name] and Casodex He feels great except for pain in his left calf for the last week. He fell off a horse 2-1/2 months ago which should not be the cause of this. He is limping because of the left leg pain in his calf. On measurement of his calf his left calf 14 cm below the patella is 1 cm larger than the right at the same level. He is right handed and actually had polio in his left leg. He also thinks the left lower leg is larger as well. He has no thigh symptoms no shortness breath or chest pain. We will get an ultrasound of his left leg today. We will see him back in 2 months but continue the Depo-[name] and bicalutamide. He feels great except for hot flashes which keep him awake at night and because of that we will try Effexor. He was once on Zoloft but has not taken that for quite some time and has none at home.  Vital signs otherwise stable. He thinks he feels better on the shot and the pill. I wonder if some of this is placebo effect.

## 2012-10-22 ENCOUNTER — Encounter (HOSPITAL_COMMUNITY): Payer: PRIVATE HEALTH INSURANCE

## 2012-10-23 ENCOUNTER — Other Ambulatory Visit (HOSPITAL_COMMUNITY): Payer: PRIVATE HEALTH INSURANCE

## 2012-11-05 ENCOUNTER — Encounter (HOSPITAL_BASED_OUTPATIENT_CLINIC_OR_DEPARTMENT_OTHER): Payer: PRIVATE HEALTH INSURANCE

## 2012-11-05 DIAGNOSIS — C61 Malignant neoplasm of prostate: Secondary | ICD-10-CM

## 2012-11-05 NOTE — Progress Notes (Signed)
Labs drawn today for psa 

## 2012-11-14 ENCOUNTER — Ambulatory Visit (HOSPITAL_COMMUNITY): Payer: PRIVATE HEALTH INSURANCE

## 2012-11-15 ENCOUNTER — Ambulatory Visit (HOSPITAL_COMMUNITY): Payer: PRIVATE HEALTH INSURANCE

## 2012-11-16 ENCOUNTER — Encounter (HOSPITAL_COMMUNITY): Payer: PRIVATE HEALTH INSURANCE | Attending: Oncology

## 2012-11-16 VITALS — BP 118/84 | HR 92

## 2012-11-16 DIAGNOSIS — M25519 Pain in unspecified shoulder: Secondary | ICD-10-CM | POA: Insufficient documentation

## 2012-11-16 DIAGNOSIS — C61 Malignant neoplasm of prostate: Secondary | ICD-10-CM

## 2012-11-16 DIAGNOSIS — Z5111 Encounter for antineoplastic chemotherapy: Secondary | ICD-10-CM

## 2012-11-16 MED ORDER — LEUPROLIDE ACETATE (3 MONTH) 22.5 MG IM KIT
22.5000 mg | PACK | Freq: Once | INTRAMUSCULAR | Status: AC
  Administered 2012-11-16: 22.5 mg via INTRAMUSCULAR
  Filled 2012-11-16: qty 22.5

## 2012-11-16 NOTE — Progress Notes (Signed)
Tolerated injection well. 

## 2012-11-21 ENCOUNTER — Encounter (HOSPITAL_BASED_OUTPATIENT_CLINIC_OR_DEPARTMENT_OTHER): Payer: PRIVATE HEALTH INSURANCE | Admitting: Oncology

## 2012-11-21 DIAGNOSIS — C61 Malignant neoplasm of prostate: Secondary | ICD-10-CM

## 2012-11-21 DIAGNOSIS — IMO0001 Reserved for inherently not codable concepts without codable children: Secondary | ICD-10-CM

## 2012-11-21 MED ORDER — OXYCODONE-ACETAMINOPHEN 5-325 MG PO TABS: 1.0000 | ORAL_TABLET | ORAL | Status: DC | PRN

## 2012-11-21 NOTE — Patient Instructions (Signed)
[facility] Discharge Instructions  RECOMMENDATIONS MADE BY THE CONSULTANT AND ANY TEST RESULTS WILL BE SENT TO YOUR REFERRING PHYSICIAN.  EXAM FINDINGS BY THE PHYSICIAN TODAY AND SIGNS OR SYMPTOMS TO REPORT TO CLINIC OR PRIMARY PHYSICIAN: Exam findings as discussed by Dr. Mariel Sleet.  MEDICATIONS PRESCRIBED:  1.  Aleve 1-2 tablets twice daily.  SPECIAL INSTRUCTIONS/FOLLOW-UP: 1. Use a warm pack to the affected area three times a day while awake for 30 minutes each time.  DO NOT LEAVE A WARM PACK ON WHILE NAPPING OR ASLEEP. 2.  Take your temperature three times a day and write it down to bring back to the office Monday for your appointment.  Thank you for choosing [facility] to provide your oncology and hematology care.  To afford each patient quality time with our providers, please arrive at least 15 minutes before your scheduled appointment time.  With your help, our goal is to use those 15 minutes to complete the necessary work-up to ensure our physicians have the information they need to help with your evaluation and healthcare recommendations.    Effective January 1st, 2014, we ask that you re-schedule your appointment with our physicians should you arrive 10 or more minutes late for your appointment.  We strive to give you quality time with our providers, and arriving late affects you and other patients whose appointments are after yours.    Again, thank you for choosing [facility].  Our hope is that these requests will decrease the amount of time that you wait before being seen by our physicians.       _____________________________________________________________  I acknowledge that I have been informed and understand all the instructions given to me and received a copy. I do not have anymore questions at this time but understand that I may call the Cancer Center at [facility] at [phone] during business hours should I have any  further questions or need assistance in obtaining follow-up care.    __________________________________________  _____________  __________ Signature of Patient or Authorized Representative            Date                   Time    __________________________________________ Nurse's Signature

## 2012-11-21 NOTE — Progress Notes (Signed)
The patient is a work in today. Since his Depo-[name] shot he has had pain in the vicinity of the injection site. He thinks it was warm last night but it has been throbbing and hurting significantly since. He has no fever today, he has had no shaking chills at home, and he is in no acute distress but he is very uncomfortable when he sits down on his buttocks for his left side sleeping at night which he cannot do he states. On exam he has a 4 x 5 cm area of induration and tenderness in proximity to the injection site it appears. There is no increased heat that I can appreciate. He is however very very tender. There is nothing like this on the opposite side.  I suspect he has a hematoma and I do not suspect infection. I do want him to take his temperature and recorded 3 times a day and see me back on Monday after treating himself with Aleve 2 twice a day, when necessary Percocet which I have given him today, and a heating pad on low for 30 minutes 3 times a day. If he gets shaking chills or a fever he is to let us know right away.

## 2012-11-26 ENCOUNTER — Encounter (HOSPITAL_COMMUNITY): Payer: Self-pay | Admitting: Oncology

## 2012-11-26 ENCOUNTER — Encounter (HOSPITAL_BASED_OUTPATIENT_CLINIC_OR_DEPARTMENT_OTHER): Payer: PRIVATE HEALTH INSURANCE | Admitting: Oncology

## 2012-11-26 VITALS — BP 127/84 | HR 73 | Temp 98.5°F | Resp 18 | Wt 222.6 lb

## 2012-11-26 DIAGNOSIS — C61 Malignant neoplasm of prostate: Secondary | ICD-10-CM

## 2012-11-26 DIAGNOSIS — M25559 Pain in unspecified hip: Secondary | ICD-10-CM

## 2012-11-26 DIAGNOSIS — M25519 Pain in unspecified shoulder: Secondary | ICD-10-CM

## 2012-11-26 NOTE — Patient Instructions (Addendum)
[facility] Discharge Instructions  RECOMMENDATIONS MADE BY THE CONSULTANT AND ANY TEST RESULTS WILL BE SENT TO YOUR REFERRING PHYSICIAN.  PSA level today. Continue PSA level every 4 weeks. Return to see Tom 01/12/12.   Thank you for choosing [facility] to provide your oncology and hematology care.  To afford each patient quality time with our providers, please arrive at least 15 minutes before your scheduled appointment time.  With your help, our goal is to use those 15 minutes to complete the necessary work-up to ensure our physicians have the information they need to help with your evaluation and healthcare recommendations.    Effective January 1st, 2014, we ask that you re-schedule your appointment with our physicians should you arrive 10 or more minutes late for your appointment.  We strive to give you quality time with our providers, and arriving late affects you and other patients whose appointments are after yours.    Again, thank you for choosing [facility].  Our hope is that these requests will decrease the amount of time that you wait before being seen by our physicians.       _____________________________________________________________  I acknowledge that I have been informed and understand all the instructions given to me and received a copy. I do not have anymore questions at this time but understand that I may call the Cancer Center at [facility] at [phone] during business hours should I have any further questions or need assistance in obtaining follow-up care.    __________________________________________  _____________  __________ Signature of Patient or Authorized Representative            Date                   Time    __________________________________________ Nurse's Signature

## 2012-11-26 NOTE — Progress Notes (Signed)
The patient returns today. He brought his temperatures with him which are absolutely within the normal range. He has had no chills, and the pain is less in the left hip. He is walking clearly better he states. He can still not sleep on his left hip however.  The left hip nodule is no longer 4 x 5 cm but is approximately 3 x 4 cm at most. It is still present however and consistent my opinion with a hematoma from the injection he received recently.  I want him to continue heat to this area 3 times a day and if is not a whole lot better in 2 weeks he is to let me know.  We will see him at the end of January now rather than the end of December

## 2012-11-26 NOTE — Progress Notes (Signed)
Walter Boone presented for labwork. Labs per MD order drawn via Peripheral Line 23 gauge needle inserted in right antecubital.  Good blood return present. Procedure without incident.  Needle removed intact. Patient tolerated procedure well.

## 2012-12-03 ENCOUNTER — Encounter (HOSPITAL_COMMUNITY): Payer: PRIVATE HEALTH INSURANCE

## 2012-12-10 ENCOUNTER — Ambulatory Visit (HOSPITAL_COMMUNITY): Payer: PRIVATE HEALTH INSURANCE | Admitting: Oncology

## 2012-12-11 ENCOUNTER — Ambulatory Visit (HOSPITAL_COMMUNITY): Payer: PRIVATE HEALTH INSURANCE | Admitting: Oncology

## 2012-12-14 ENCOUNTER — Ambulatory Visit (HOSPITAL_COMMUNITY): Payer: PRIVATE HEALTH INSURANCE | Admitting: Oncology

## 2013-01-10 ENCOUNTER — Encounter (HOSPITAL_COMMUNITY): Payer: PRIVATE HEALTH INSURANCE | Attending: Oncology

## 2013-01-10 DIAGNOSIS — C61 Malignant neoplasm of prostate: Secondary | ICD-10-CM | POA: Insufficient documentation

## 2013-01-10 LAB — PSA: PSA: 0.42 ng/mL (ref <=4.00)

## 2013-01-10 NOTE — Progress Notes (Signed)
Labs drawn today for psa 

## 2013-01-11 ENCOUNTER — Ambulatory Visit (HOSPITAL_COMMUNITY): Payer: PRIVATE HEALTH INSURANCE | Admitting: Oncology

## 2013-01-11 ENCOUNTER — Encounter (HOSPITAL_BASED_OUTPATIENT_CLINIC_OR_DEPARTMENT_OTHER): Payer: PRIVATE HEALTH INSURANCE | Admitting: Oncology

## 2013-01-11 ENCOUNTER — Encounter (HOSPITAL_COMMUNITY): Payer: Self-pay | Admitting: Oncology

## 2013-01-11 VITALS — BP 119/79 | HR 95 | Temp 97.8°F | Resp 18 | Wt 228.6 lb

## 2013-01-11 DIAGNOSIS — N62 Hypertrophy of breast: Secondary | ICD-10-CM | POA: Insufficient documentation

## 2013-01-11 DIAGNOSIS — C61 Malignant neoplasm of prostate: Secondary | ICD-10-CM

## 2013-01-11 DIAGNOSIS — K5900 Constipation, unspecified: Secondary | ICD-10-CM

## 2013-01-11 HISTORY — DX: Hypertrophy of breast: N62

## 2013-01-11 NOTE — Patient Instructions (Addendum)
[facility] Discharge Instructions  RECOMMENDATIONS MADE BY THE CONSULTANT AND ANY TEST RESULTS WILL BE SENT TO YOUR REFERRING PHYSICIAN.  We will continue [name] injections as scheduled. Lab work monthly. (PSA) Have CT scan as scheduled. Return to clinic in 3 months to see MD.  Thank you for choosing [facility] to provide your oncology and hematology care.  To afford each patient quality time with our providers, please arrive at least 15 minutes before your scheduled appointment time.  With your help, our goal is to use those 15 minutes to complete the necessary work-up to ensure our physicians have the information they need to help with your evaluation and healthcare recommendations.    Effective January 1st, 2014, we ask that you re-schedule your appointment with our physicians should you arrive 10 or more minutes late for your appointment.  We strive to give you quality time with our providers, and arriving late affects you and other patients whose appointments are after yours.    Again, thank you for choosing [facility].  Our hope is that these requests will decrease the amount of time that you wait before being seen by our physicians.       _____________________________________________________________  Should you have questions after your visit to [facility], please contact our office at [phone] between the hours of 8:30 a.m. and 5:00 p.m.  Voicemails left after 4:30 p.m. will not be returned until the following business day.  For prescription refill requests, have your pharmacy contact our office with your prescription refill request.

## 2013-01-11 NOTE — Progress Notes (Signed)
Walter Maudlin, MD [address] [place] [address]  1. Prostate carcinoma, recurrent     CURRENT THERAPY: On Casodex daily and [name] every 3 months  INTERVAL HISTORY: Walter Boone 62 y.o. male returns for  regular  visit for followup of recurrent prostate cancer.  Now on Depo-[name] every 3 months and Casodex daily.   He reports only minimal hot flashes.   He reports some mild constipation which is corrected with Prune juice or mild stool softener.    He reports some mammary tenderness.  This is secondary to his prostate treatment. He reports that it only bothers him when he bumps something against it.  Otherwise it does not cause any discomfort.  He also reports some gynecomastia.    His other complaint is difficulty with his first AM urine.  He denies that it is tender or painful.  He reports that his stream is weak and takes quite some time to empty his bladder.  Otherwise, throughout the day he denies any complaints.  I offered him to try FloMax for a short time to see if that is helpful or treat conservatively with time/observation.  Hopefully, as his PSA continues to drop, his urine stream is improved.   He has chosen to to pursue any treatment since it only occurs on his first AM urination and otherwise, throughout the day he denies any urinary complaints.   I personally reviewed and went over laboratory results with the patient.  His PSA has dropped over the past 3 months from 1.33 to most recently 0.42.  Oncologically, he denies any other complaints and ROS questioning is negative.     Past Medical History  Diagnosis Date  . Prostate cancer   . Fatty liver   . Hypertension   . Hyperlipidemia   . Hemorrhoids     history  . Prostate cancer 06/20/2011  . Prostate carcinoma, recurrent 08/10/2012    has H N P-CERVICAL; CERVICAL SPASM; Prostate cancer; and Prostate carcinoma, recurrent on his problem list.      has no known allergies.  Walter Boone  had no medications administered during this visit.  Past Surgical History  Procedure Date  . Prostate biopsy 11/06  . Esophagogastroduodenoscopy   . Knee surgery     left  . Ankle surgery 1987    left  . Hernia repair 2010  . Colonoscopy 2008    Denies any headaches, dizziness, double vision, fevers, chills, night sweats, nausea, vomiting, diarrhea, chest pain, heart palpitations, shortness of breath, blood in stool, black tarry stool, urinary pain, urinary burning, hematuria.   PHYSICAL EXAMINATION  ECOG PERFORMANCE STATUS: 1 - Symptomatic but completely ambulatory  Filed Vitals:   01/11/13 1540  BP: 119/79  Pulse: 95  Temp: 97.8 F (36.6 C)  Resp: 18    GENERAL:alert, no distress, well nourished, well developed, comfortable, cooperative and smiling SKIN: skin color, texture, turgor are normal, no rashes or significant lesions HEAD: Normocephalic, No masses, lesions, tenderness or abnormalities EYES: normal, Conjunctiva are pink and non-injected EARS: External ears normal OROPHARYNX:mucous membranes are moist  NECK: supple, no adenopathy, thyroid normal size, non-tender, without nodularity, no stridor, non-tender, trachea midline LYMPH:  no palpable lymphadenopathy BREAST:B/L gynecomastia with some mild tenderness on palpation. LUNGS: clear to auscultation and percussion HEART: regular rate & rhythm, no murmurs, no gallops, S1 normal and S2 normal ABDOMEN:abdomen soft, non-tender and normal bowel sounds BACK: Back symmetric, no curvature., No CVA tenderness EXTREMITIES:less then 2 second capillary  refill, no joint deformities, effusion, or inflammation, no edema, no skin discoloration, no clubbing, no cyanosis  NEURO: alert & oriented x 3 with fluent speech, no focal motor/sensory deficits, gait normal   LABORATORY DATA: Lab Results  Component Value Date   PSA 0.42 01/10/2013   PSA 0.91 11/26/2012   PSA 1.33 11/05/2012      ASSESSMENT:  1. Recurrent prostate  cancer 2. B/L gynecomastia with some tenderness 3. Constipation, well controlled with Prune Juice +/- Stool softener   PLAN:  1. CT of chest without contrast ordered for March 2. Labs every 4 weeks: PSA 3. Conservatively treat via observation of difficulty of AM urine 4. Continue with current bowel regimen 5. Continue with Casodex daily 6. Continue [name] every 3 months, next due in March 2014. 7. Return in 3 months for follow-up.    All questions were answered. The patient knows to call the clinic with any problems, questions or concerns. We can certainly see the patient much sooner if necessary.  The patient and plan discussed with Walter Peers, MD and he is in agreement with the aforementioned.   Walter Boone

## 2013-01-14 ENCOUNTER — Encounter (HOSPITAL_COMMUNITY): Payer: PRIVATE HEALTH INSURANCE

## 2013-01-14 ENCOUNTER — Ambulatory Visit (HOSPITAL_COMMUNITY): Payer: PRIVATE HEALTH INSURANCE | Admitting: Oncology

## 2013-02-12 ENCOUNTER — Other Ambulatory Visit (HOSPITAL_COMMUNITY): Payer: Self-pay | Admitting: Oncology

## 2013-02-12 ENCOUNTER — Telehealth (HOSPITAL_COMMUNITY): Payer: Self-pay | Admitting: *Deleted

## 2013-02-12 DIAGNOSIS — C61 Malignant neoplasm of prostate: Secondary | ICD-10-CM

## 2013-02-12 NOTE — Telephone Encounter (Signed)
Patient is coming Friday for lupron. We need lab orders.

## 2013-02-15 ENCOUNTER — Ambulatory Visit (HOSPITAL_COMMUNITY): Payer: PRIVATE HEALTH INSURANCE

## 2013-02-15 ENCOUNTER — Other Ambulatory Visit (HOSPITAL_COMMUNITY): Payer: PRIVATE HEALTH INSURANCE

## 2013-02-18 ENCOUNTER — Encounter (HOSPITAL_COMMUNITY): Payer: PRIVATE HEALTH INSURANCE | Attending: Oncology

## 2013-02-18 ENCOUNTER — Encounter (HOSPITAL_COMMUNITY): Payer: PRIVATE HEALTH INSURANCE

## 2013-02-18 DIAGNOSIS — C61 Malignant neoplasm of prostate: Secondary | ICD-10-CM | POA: Insufficient documentation

## 2013-02-18 LAB — CBC WITH DIFFERENTIAL/PLATELET
Basophils Relative: 1 % (ref 0–1)
Hemoglobin: 13.5 g/dL (ref 13.0–17.0)
MCHC: 34.4 g/dL (ref 30.0–36.0)
Monocytes Relative: 9 % (ref 3–12)
Neutro Abs: 5 10*3/uL (ref 1.7–7.7)
Neutrophils Relative %: 57 % (ref 43–77)
RBC: 4.31 MIL/uL (ref 4.22–5.81)
WBC: 8.9 10*3/uL (ref 4.0–10.5)

## 2013-02-18 LAB — COMPREHENSIVE METABOLIC PANEL
AST: 24 U/L (ref 0–37)
Albumin: 4.3 g/dL (ref 3.5–5.2)
Alkaline Phosphatase: 57 U/L (ref 39–117)
BUN: 26 mg/dL — ABNORMAL HIGH (ref 6–23)
Chloride: 98 mEq/L (ref 96–112)
Potassium: 3.3 mEq/L — ABNORMAL LOW (ref 3.5–5.1)
Total Bilirubin: 0.3 mg/dL (ref 0.3–1.2)

## 2013-02-18 MED ORDER — LEUPROLIDE ACETATE (3 MONTH) 22.5 MG IM KIT
22.5000 mg | PACK | Freq: Once | INTRAMUSCULAR | Status: AC
  Administered 2013-02-18: 22.5 mg via INTRAMUSCULAR
  Filled 2013-02-18: qty 22.5

## 2013-02-18 NOTE — Progress Notes (Signed)
Walter Boone presents today for injection per MD orders. lupron 22.5 mg administered IM in right Gluteal. Administration without incident. Patient tolerated well.

## 2013-02-19 ENCOUNTER — Other Ambulatory Visit (HOSPITAL_COMMUNITY): Payer: Self-pay | Admitting: Oncology

## 2013-02-19 DIAGNOSIS — E876 Hypokalemia: Secondary | ICD-10-CM

## 2013-02-19 LAB — PSA: PSA: 0.32 ng/mL (ref <=4.00)

## 2013-02-19 MED ORDER — POTASSIUM CHLORIDE CRYS ER 20 MEQ PO TBCR: 20.0000 meq | EXTENDED_RELEASE_TABLET | Freq: Every day | ORAL | Status: DC

## 2013-02-27 ENCOUNTER — Telehealth (HOSPITAL_COMMUNITY): Payer: Self-pay

## 2013-02-27 NOTE — Telephone Encounter (Signed)
Message copied by Evelena Leyden on Wed Feb 27, 2013  5:26 PM ------      Message from: Mariel Sleet, ERIC S      Created: Wed Feb 27, 2013  4:13 PM       Probably need to get urology input or      Take flomax bid for week and see what happens(it is now generic) ------

## 2013-02-27 NOTE — Telephone Encounter (Signed)
Call from patient stating "I only have 1 Flomax left and have only been taking 1 daily due to the cost of the pills.   I'm having problems getting my stream started and it takes a long time to empty.  Want to know if there is anything else that I can take that is cheaper to help with this?"

## 2013-02-27 NOTE — Telephone Encounter (Signed)
Patient notified and wants to think about things and will call us back on Friday.

## 2013-03-01 ENCOUNTER — Other Ambulatory Visit (HOSPITAL_COMMUNITY): Payer: Self-pay

## 2013-03-01 MED ORDER — TAMSULOSIN HCL 0.4 MG PO CAPS: 0.4000 mg | ORAL_CAPSULE | Freq: Two times a day (BID) | ORAL | Status: DC

## 2013-03-06 ENCOUNTER — Ambulatory Visit (HOSPITAL_COMMUNITY)
Admission: RE | Admit: 2013-03-06 | Discharge: 2013-03-06 | Disposition: A | Discharge status: Home / Self Care | Payer: PRIVATE HEALTH INSURANCE | Source: Ambulatory Visit | Attending: Oncology | Admitting: Oncology

## 2013-03-06 DIAGNOSIS — R911 Solitary pulmonary nodule: Secondary | ICD-10-CM

## 2013-03-06 DIAGNOSIS — Z8546 Personal history of malignant neoplasm of prostate: Secondary | ICD-10-CM | POA: Insufficient documentation

## 2013-03-13 ENCOUNTER — Telehealth (HOSPITAL_COMMUNITY): Payer: Self-pay | Admitting: Oncology

## 2013-03-20 ENCOUNTER — Encounter (HOSPITAL_COMMUNITY): Payer: PRIVATE HEALTH INSURANCE | Attending: Oncology

## 2013-03-20 DIAGNOSIS — C61 Malignant neoplasm of prostate: Secondary | ICD-10-CM

## 2013-03-20 LAB — PSA: PSA: 0.26 ng/mL (ref <=4.00)

## 2013-03-20 NOTE — Progress Notes (Signed)
Labs drawn today for psa 

## 2013-04-10 ENCOUNTER — Ambulatory Visit (HOSPITAL_COMMUNITY): Payer: PRIVATE HEALTH INSURANCE | Admitting: Oncology

## 2013-04-16 ENCOUNTER — Encounter (HOSPITAL_COMMUNITY): Payer: Self-pay

## 2013-04-19 ENCOUNTER — Encounter (HOSPITAL_COMMUNITY): Payer: PRIVATE HEALTH INSURANCE | Attending: Oncology

## 2013-04-19 DIAGNOSIS — C61 Malignant neoplasm of prostate: Secondary | ICD-10-CM | POA: Insufficient documentation

## 2013-04-19 NOTE — Progress Notes (Signed)
Labs drawn today for psa 

## 2013-04-20 NOTE — Progress Notes (Signed)
-  No show, letter sent-   

## 2013-04-22 ENCOUNTER — Ambulatory Visit (HOSPITAL_COMMUNITY)
Admission: RE | Admit: 2013-04-22 | Discharge: 2013-04-22 | Disposition: A | Discharge status: Home / Self Care | Payer: PRIVATE HEALTH INSURANCE | Source: Ambulatory Visit | Attending: Pulmonary Disease | Admitting: Pulmonary Disease

## 2013-04-22 ENCOUNTER — Other Ambulatory Visit (HOSPITAL_COMMUNITY): Payer: Self-pay | Admitting: Pulmonary Disease

## 2013-04-22 ENCOUNTER — Ambulatory Visit (HOSPITAL_COMMUNITY): Payer: PRIVATE HEALTH INSURANCE | Admitting: Oncology

## 2013-04-22 DIAGNOSIS — J40 Bronchitis, not specified as acute or chronic: Secondary | ICD-10-CM

## 2013-04-22 DIAGNOSIS — R059 Cough, unspecified: Secondary | ICD-10-CM | POA: Insufficient documentation

## 2013-04-22 DIAGNOSIS — R05 Cough: Secondary | ICD-10-CM | POA: Insufficient documentation

## 2013-05-12 NOTE — Progress Notes (Signed)
[name], MD [address] [place] [address]  Prostate cancer - Plan: PSA, CBC with Differential, Comprehensive metabolic panel  Prostate carcinoma, recurrent - Plan: SCHEDULING COMMUNICATION INJECTION, leuprolide ([name]) injection 22.5 mg  CURRENT THERAPY:On Casodex daily and [name] every 3 months (last administered on 02/18/2013)   INTERVAL HISTORY: Walter Boone 62 y.o. male returns for  regular  visit for followup of recurrent prostate cancer. Now on Depo-[name] every 3 months and Casodex daily.   He reports only minimal hot flashes.   He reports some mild constipation which is corrected with Prune juice or mild stool softener.   He reports that his mammary tenderness has resolved. This is secondary to his prostate treatment. He reports that it only bothers him when he bumps something against it and that is improving. Otherwise it does not cause any discomfort. He also reports some gynecomastia.  I personally reviewed and went over laboratory results with the patient.I spent time reviewing the patient's PSA with him.  In March his PSA was 0.32 and then in March it dropped to 0.26.  It is up slightly to 0.40 but this is nonspecific at this time.   We will continue with current therapy and continue to monitor PSA levels.  His urine stream is at baseline and he denies any complaints with urinary symptoms.  She otherwise denies any complaints and ROS questioning is negative oncologically.  His last Depo-[name] was on 02/18/2013. I discussed this with the nurse and he will get his Depo-[name] injection today. We'll continue to monitor her laboratory work via PSA every 4 weeks and every 3 months, we'll perform a CBC differential and complete metabolic panel. He is to continue with his Casodex daily which he is tolerating well without any difficulties.    Past Medical History  Diagnosis Date  . Prostate cancer   . Fatty liver   . Hypertension   .  Hyperlipidemia   . Hemorrhoids     history  . Prostate cancer 06/20/2011  . Prostate carcinoma, recurrent 08/10/2012  . Gynecomastia, male 01/11/2013    Secondary to prostate ca Tx.     has H N P-CERVICAL; CERVICAL SPASM; Prostate cancer; and Gynecomastia, male on his problem list.     has No Known Allergies.  We administered leuprolide.  Past Surgical History  Procedure Laterality Date  . Prostate biopsy  11/06  . Esophagogastroduodenoscopy    . Knee surgery      left  . Ankle surgery  1987    left  . Hernia repair  2010  . Colonoscopy  2008    Denies any headaches, dizziness, double vision, fevers, chills, night sweats, nausea, vomiting, diarrhea, constipation, chest pain, heart palpitations, shortness of breath, blood in stool, black tarry stool, urinary pain, urinary burning, urinary frequency, hematuria.   PHYSICAL EXAMINATION  ECOG PERFORMANCE STATUS: 0 - Asymptomatic  Filed Vitals:   05/13/13 1500  BP: 121/75  Pulse: 83  Temp: 98 F (36.7 C)  Resp: 18    GENERAL:alert, no distress, well nourished, well developed, comfortable, cooperative and smiling  SKIN: skin color, texture, turgor are normal, no rashes or significant lesions  HEAD: Normocephalic, No masses, lesions, tenderness or abnormalities  EYES: normal, Conjunctiva are pink and non-injected  EARS: External ears normal  OROPHARYNX:mucous membranes are moist  NECK: supple, no adenopathy, thyroid normal size, non-tender, without nodularity, no stridor, non-tender, trachea midline  LYMPH: no palpable lymphadenopathy  BREAST:B/L gynecomastia with some mild tenderness on  palpation.  LUNGS: clear to auscultation and percussion  HEART: regular rate & rhythm, no murmurs, no gallops, S1 normal and S2 normal  ABDOMEN:abdomen soft, non-tender and normal bowel sounds  BACK: Back symmetric, no curvature., No CVA tenderness  EXTREMITIES:less then 2 second capillary refill, no joint deformities, effusion, or  inflammation, no edema, no skin discoloration, no clubbing, no cyanosis  NEURO: alert & oriented x 3 with fluent speech, no focal motor/sensory deficits, gait normal   LABORATORY DATA: Lab Results  Component Value Date   PSA 0.40 04/19/2013   PSA 0.26 03/20/2013   PSA 0.32 02/18/2013   CBC    Component Value Date/Time   WBC 8.9 02/18/2013 1623   RBC 4.31 02/18/2013 1623   HGB 13.5 02/18/2013 1623   HCT 39.2 02/18/2013 1623   PLT 416* 02/18/2013 1623   MCV 91.0 02/18/2013 1623   MCH 31.3 02/18/2013 1623   MCHC 34.4 02/18/2013 1623   RDW 12.3 02/18/2013 1623   LYMPHSABS 2.7 02/18/2013 1623   MONOABS 0.8 02/18/2013 1623   EOSABS 0.3 02/18/2013 1623   BASOSABS 0.0 02/18/2013 1623      Chemistry      Component Value Date/Time   NA 136 02/18/2013 1623   K 3.3* 02/18/2013 1623   CL 98 02/18/2013 1623   CO2 27 02/18/2013 1623   BUN 26* 02/18/2013 1623   CREATININE 1.00 02/18/2013 1623      Component Value Date/Time   CALCIUM 9.9 02/18/2013 1623   ALKPHOS 57 02/18/2013 1623   AST 24 02/18/2013 1623   ALT 26 02/18/2013 1623   BILITOT 0.3 02/18/2013 1623        ASSESSMENT:  1. Recurrent prostate cancer. Now on Depo-[name] every 3 months and Casodex daily.  2. B/L gynecomastia with some tenderness  3. Constipation, well controlled with Prune Juice +/- Stool softener  Patient Active Problem List   Diagnosis Date Noted  . Gynecomastia, male 01/11/2013  . Prostate cancer 06/20/2011  . H N P-CERVICAL 10/05/2009  . CERVICAL SPASM 10/05/2009    PLAN:  1. I personally reviewed and went over laboratory results with the patient. 2. Every 4 weeks PSA 3. Every 12 weeks: CBC diff, CMET 4. Continue Depo-[name] every 3 months.  Last given on 310/2014 5. Depo-[name] today 6. Labs today: CBC diff, CMET, PSA 7. Continue Casodex daily 8. Return in 3 months for follow-up.  All questions were answered. The patient knows to call the clinic with any problems, questions or concerns. We can certainly see the  patient much sooner if necessary.  The patient and plan discussed with Glenford Peers, MD and he is in agreement with the aforementioned.  KEFALAS,THOMAS

## 2013-05-13 ENCOUNTER — Encounter (HOSPITAL_COMMUNITY): Payer: PRIVATE HEALTH INSURANCE | Attending: Oncology | Admitting: Oncology

## 2013-05-13 ENCOUNTER — Encounter (HOSPITAL_COMMUNITY): Payer: Self-pay | Admitting: Oncology

## 2013-05-13 VITALS — BP 121/75 | HR 83 | Temp 98.0°F | Resp 18 | Wt 224.0 lb

## 2013-05-13 DIAGNOSIS — Z5111 Encounter for antineoplastic chemotherapy: Secondary | ICD-10-CM

## 2013-05-13 DIAGNOSIS — C61 Malignant neoplasm of prostate: Secondary | ICD-10-CM | POA: Insufficient documentation

## 2013-05-13 LAB — COMPREHENSIVE METABOLIC PANEL
BUN: 27 mg/dL — ABNORMAL HIGH (ref 6–23)
Calcium: 10.1 mg/dL (ref 8.4–10.5)
GFR calc Af Amer: >90 mL/min (ref >90)
Glucose, Bld: 105 mg/dL — ABNORMAL HIGH (ref 70–99)
Total Protein: 7.4 g/dL (ref 6.0–8.3)

## 2013-05-13 LAB — CBC WITH DIFFERENTIAL/PLATELET
Eosinophils Absolute: 0.3 10*3/uL (ref 0.0–0.7)
Eosinophils Relative: 3 % (ref 0–5)
HCT: 36.9 % — ABNORMAL LOW (ref 39.0–52.0)
Hemoglobin: 13.4 g/dL (ref 13.0–17.0)
Lymphs Abs: 3.1 10*3/uL (ref 0.7–4.0)
MCH: 32.7 pg (ref 26.0–34.0)
MCV: 90 fL (ref 78.0–100.0)
Monocytes Absolute: 0.8 10*3/uL (ref 0.1–1.0)
Monocytes Relative: 7 % (ref 3–12)
RBC: 4.1 MIL/uL — ABNORMAL LOW (ref 4.22–5.81)

## 2013-05-13 MED ORDER — LEUPROLIDE ACETATE (3 MONTH) 22.5 MG IM KIT
22.5000 mg | PACK | Freq: Once | INTRAMUSCULAR | Status: AC
  Administered 2013-05-13: 22.5 mg via INTRAMUSCULAR
  Filled 2013-05-13: qty 22.5

## 2013-05-13 NOTE — Progress Notes (Signed)
Walter Boone presented for labwork. Labs per MD order drawn via Peripheral Line 21 gauge needle inserted in left antecubital.  Good blood return present. Procedure without incident.  Needle removed intact. Patient tolerated procedure well.  Walter Boone presents today for injection per MD orders. DepoLupron injection administered IM in left Gluteal. Administration without incident. Patient tolerated well.

## 2013-05-13 NOTE — Patient Instructions (Addendum)
[facility] Discharge Instructions  RECOMMENDATIONS MADE BY THE CONSULTANT AND ANY TEST RESULTS WILL BE SENT TO YOUR REFERRING PHYSICIAN.  Lab work today. We will continue to check PSA levels monthly. DepoLupron injections every 3 months. Return to see MD in 3 months. Report any issues/concerns to clinic as needed.  Thank you for choosing [facility] to provide your oncology and hematology care.  To afford each patient quality time with our providers, please arrive at least 15 minutes before your scheduled appointment time.  With your help, our goal is to use those 15 minutes to complete the necessary work-up to ensure our physicians have the information they need to help with your evaluation and healthcare recommendations.    Effective January 1st, 2014, we ask that you re-schedule your appointment with our physicians should you arrive 10 or more minutes late for your appointment.  We strive to give you quality time with our providers, and arriving late affects you and other patients whose appointments are after yours.    Again, thank you for choosing [facility].  Our hope is that these requests will decrease the amount of time that you wait before being seen by our physicians.       _____________________________________________________________  Should you have questions after your visit to [facility], please contact our office at [phone] between the hours of 8:30 a.m. and 5:00 p.m.  Voicemails left after 4:30 p.m. will not be returned until the following business day.  For prescription refill requests, have your pharmacy contact our office with your prescription refill request.

## 2013-05-14 ENCOUNTER — Other Ambulatory Visit (HOSPITAL_COMMUNITY): Payer: Self-pay | Admitting: Oncology

## 2013-05-14 DIAGNOSIS — E876 Hypokalemia: Secondary | ICD-10-CM

## 2013-05-14 MED ORDER — POTASSIUM CHLORIDE CRYS ER 20 MEQ PO TBCR: 20.0000 meq | EXTENDED_RELEASE_TABLET | Freq: Two times a day (BID) | ORAL | Status: DC

## 2013-06-06 ENCOUNTER — Telehealth (HOSPITAL_COMMUNITY): Payer: Self-pay | Admitting: *Deleted

## 2013-06-06 NOTE — Telephone Encounter (Signed)
Should be fine.

## 2013-06-06 NOTE — Telephone Encounter (Signed)
Can he take melatonin for sleep. Is there any contraindication with his prostate ca/treatment.

## 2013-06-17 ENCOUNTER — Encounter (HOSPITAL_COMMUNITY): Payer: PRIVATE HEALTH INSURANCE | Attending: Oncology

## 2013-06-17 DIAGNOSIS — C61 Malignant neoplasm of prostate: Secondary | ICD-10-CM

## 2013-06-17 LAB — PSA: PSA: 0.18 ng/mL (ref <=4.00)

## 2013-06-17 NOTE — Progress Notes (Signed)
Labs drawn today for psa 

## 2013-07-15 ENCOUNTER — Encounter (HOSPITAL_COMMUNITY): Payer: PRIVATE HEALTH INSURANCE | Attending: Oncology

## 2013-07-15 DIAGNOSIS — C61 Malignant neoplasm of prostate: Secondary | ICD-10-CM

## 2013-07-15 NOTE — Progress Notes (Signed)
Walter Boone presented for labwork. Labs per MD order drawn via Peripheral Line 25 gauge needle inserted in lt ac.  Good blood return present. Procedure without incident.  Needle removed intact. Patient tolerated procedure well.

## 2013-08-13 ENCOUNTER — Encounter (HOSPITAL_COMMUNITY): Payer: PRIVATE HEALTH INSURANCE | Attending: Oncology | Admitting: Oncology

## 2013-08-13 ENCOUNTER — Encounter (HOSPITAL_COMMUNITY): Payer: Self-pay | Admitting: Oncology

## 2013-08-13 ENCOUNTER — Encounter (HOSPITAL_COMMUNITY): Payer: PRIVATE HEALTH INSURANCE

## 2013-08-13 VITALS — BP 107/88 | HR 82 | Temp 98.0°F | Resp 18 | Wt 224.4 lb

## 2013-08-13 DIAGNOSIS — R5383 Other fatigue: Secondary | ICD-10-CM

## 2013-08-13 DIAGNOSIS — R232 Flushing: Secondary | ICD-10-CM

## 2013-08-13 DIAGNOSIS — R5381 Other malaise: Secondary | ICD-10-CM | POA: Insufficient documentation

## 2013-08-13 DIAGNOSIS — C61 Malignant neoplasm of prostate: Secondary | ICD-10-CM

## 2013-08-13 DIAGNOSIS — N951 Menopausal and female climacteric states: Secondary | ICD-10-CM | POA: Insufficient documentation

## 2013-08-13 DIAGNOSIS — Z5111 Encounter for antineoplastic chemotherapy: Secondary | ICD-10-CM

## 2013-08-13 DIAGNOSIS — N62 Hypertrophy of breast: Secondary | ICD-10-CM

## 2013-08-13 LAB — CBC WITH DIFFERENTIAL/PLATELET
Basophils Absolute: 0.1 10*3/uL (ref 0.0–0.1)
Eosinophils Absolute: 0.3 10*3/uL (ref 0.0–0.7)
Lymphocytes Relative: 29 % (ref 12–46)
Lymphs Abs: 2.8 10*3/uL (ref 0.7–4.0)
MCH: 31.9 pg (ref 26.0–34.0)
Neutrophils Relative %: 60 % (ref 43–77)
Platelets: 424 10*3/uL — ABNORMAL HIGH (ref 150–400)
RBC: 4.11 MIL/uL — ABNORMAL LOW (ref 4.22–5.81)
RDW: 12.4 % (ref 11.5–15.5)
WBC: 9.9 10*3/uL (ref 4.0–10.5)

## 2013-08-13 LAB — COMPREHENSIVE METABOLIC PANEL
ALT: 28 U/L (ref 0–53)
AST: 25 U/L (ref 0–37)
Albumin: 4.3 g/dL (ref 3.5–5.2)
Alkaline Phosphatase: 60 U/L (ref 39–117)
GFR calc Af Amer: >90 mL/min (ref >90)
Glucose, Bld: 97 mg/dL (ref 70–99)
Potassium: 3 mEq/L — ABNORMAL LOW (ref 3.5–5.1)
Sodium: 139 mEq/L (ref 135–145)
Total Protein: 8 g/dL (ref 6.0–8.3)

## 2013-08-13 MED ORDER — SERTRALINE HCL 25 MG PO TABS: 25.0000 mg | ORAL_TABLET | Freq: Every day | ORAL | Status: DC

## 2013-08-13 MED ORDER — LEUPROLIDE ACETATE (3 MONTH) 22.5 MG IM KIT
22.5000 mg | PACK | Freq: Once | INTRAMUSCULAR | Status: AC
  Administered 2013-08-13: 22.5 mg via INTRAMUSCULAR
  Filled 2013-08-13: qty 22.5

## 2013-08-13 NOTE — Progress Notes (Signed)
Walter Maudlin, MD [address] [place] [address]  Prostate cancer - Plan: PSA, Testosterone, Testosterone, % free, Testosterone, free, Testosterone, Testosterone, % free, Testosterone, free  Hot flashes - Plan: sertraline (ZOLOFT) 25 MG tablet  Fatigue - Plan: TSH, Testosterone, Testosterone, % free, Testosterone, free, TSH, Testosterone, Testosterone, % free, Testosterone, free  CURRENT THERAPY: On Casodex daily and [name] every 3 months (last administered on 05/13/2013)   INTERVAL HISTORY: Walter Boone 62 y.o. male returns for  regular  visit for followup of recurrent prostate cancer. Now on Depo-[name] every 3 months and Casodex daily.   I personally reviewed and went over laboratory results with the patient.  PSA is very stable and over the past 3 months has ranged between 0.31 and 0.18 and has been stable over the past year on the current regimen. CBC is stable along with metabolic panel.  The patient has 2 major complaints today: 1. Rectal bleeding- he reports that he occasionally sees blood on the toilet paper.  He denies blood in the toilet bowel H2O and he denies blood in stools.  He denies black tarry stool as well.  He reports that it occurs infrequently.  He admits to burning when this occurs and also anal pruritis.  This sounds like hemorrhoids and I have recommended OTC preparation H.  2. Fatigue- this has been progressive.  He reports more recently, within the past 2 months, he has been so tired that he nearly falls asleep at his desk at work.  He reports that he becomes short of breath and fatigued easier on exertion.  He reports that he is weak as well.  He admits that he does not sleep much at night secondary to hot flashes.    He has been following with his PCP, Dr. Juanetta Boone.  His BP is noted to be a little low systolically at 107.  The patient reports that Dr. Juanetta Boone was planning on dropping a BP medication in the near future but has not  yet done so.  I suspect his fatigue is multifactorial.  We have decided to try low-dose Zoloft for his hot flashes and see if that helps.  I have recommended at least a 3-4 week trial of this medication.  He is educated regarding castration-induced hot flashes.  He was educated on the anecdotal treatment of this is typically low dose antidepressants such as a SSRI.  I question whether his low BP is also contributing.  I suspect the main culprit of his fatigue is from his hot flashes preventing a good night's rest.  However, I will check a testosterone level (to make sure he is castrated) and also a TSH.  Otherwise, oncologically, he denies any complaints and ROS questioning is negative.       Past Medical History  Diagnosis Date  . Prostate cancer   . Fatty liver   . Hypertension   . Hyperlipidemia   . Hemorrhoids     history  . Prostate cancer 06/20/2011  . Prostate carcinoma, recurrent 08/10/2012  . Gynecomastia, male 01/11/2013    Secondary to prostate ca Tx.     has H N P-CERVICAL; CERVICAL SPASM; Prostate cancer; and Gynecomastia, male on his problem list.     has No Known Allergies.  Walter Boone had no medications administered during this visit.  Past Surgical History  Procedure Laterality Date  . Prostate biopsy  11/06  . Esophagogastroduodenoscopy    . Knee surgery  left  . Ankle surgery  1987    left  . Hernia repair  2010  . Colonoscopy  2008    Denies any headaches, dizziness, double vision, fevers, chills, night sweats, nausea, vomiting, diarrhea, constipation, chest pain, heart palpitations, shortness of breath, blood in stool, black tarry stool, urinary pain, urinary burning, urinary frequency, hematuria.   PHYSICAL EXAMINATION  ECOG PERFORMANCE STATUS: 1 - Symptomatic but completely ambulatory  Filed Vitals:   08/13/13 1551  BP: 107/88  Pulse: 82  Temp: 98 F (36.7 C)  Resp: 18    GENERAL:alert, no distress, well nourished, well developed,  comfortable, cooperative and smiling  SKIN: skin color, texture, turgor are normal, no rashes or significant lesions  HEAD: Normocephalic, No masses, lesions, tenderness or abnormalities  EYES: normal, Conjunctiva are pink and non-injected  EARS: External ears normal  OROPHARYNX:mucous membranes are moist  NECK: supple, no adenopathy, thyroid normal size, non-tender, without nodularity, no stridor, non-tender, trachea midline  LYMPH: no palpable lymphadenopathy  BREAST:B/L gynecomastia with some mild tenderness on palpation.  LUNGS: clear to auscultation and percussion  HEART: regular rate & rhythm, no murmurs, no gallops, S1 normal and S2 normal  ABDOMEN:abdomen soft, non-tender and normal bowel sounds  BACK: Back symmetric, no curvature., No CVA tenderness  EXTREMITIES:less then 2 second capillary refill, no joint deformities, effusion, or inflammation, no edema, no skin discoloration, no clubbing, no cyanosis  NEURO: alert & oriented x 3 with fluent speech, no focal motor/sensory deficits, gait normal    LABORATORY DATA: CBC    Component Value Date/Time   WBC 11.5* 05/13/2013 1600   RBC 4.10* 05/13/2013 1600   HGB 13.4 05/13/2013 1600   HCT 36.9* 05/13/2013 1600   PLT 395 05/13/2013 1600   MCV 90.0 05/13/2013 1600   MCH 32.7 05/13/2013 1600   MCHC 36.3* 05/13/2013 1600   RDW 12.9 05/13/2013 1600   LYMPHSABS 3.1 05/13/2013 1600   MONOABS 0.8 05/13/2013 1600   EOSABS 0.3 05/13/2013 1600   BASOSABS 0.0 05/13/2013 1600      Chemistry      Component Value Date/Time   NA 137 05/13/2013 1600   K 3.2* 05/13/2013 1600   CL 97 05/13/2013 1600   CO2 27 05/13/2013 1600   BUN 27* 05/13/2013 1600   CREATININE 0.91 05/13/2013 1600      Component Value Date/Time   CALCIUM 10.1 05/13/2013 1600   ALKPHOS 59 05/13/2013 1600   AST 21 05/13/2013 1600   ALT 24 05/13/2013 1600   BILITOT 0.3 05/13/2013 1600        PENDING LABS: CBC diff, CMET, PSA, TSH, Testosterone level    ASSESSMENT:  1. Recurrent prostate cancer. Now  on Depo-[name] every 3 months and Casodex daily.  2. B/L gynecomastia with some tenderness  3. Constipation, well controlled with Prune Juice +/- Stool softener 4. Rectal bleeding, burning, itching 5. Moderate-Severe fatigue.  Patient Active Problem List   Diagnosis Date Noted  . Gynecomastia, male 01/11/2013  . Prostate cancer 06/20/2011  . H N P-CERVICAL 10/05/2009  . CERVICAL SPASM 10/05/2009    PLAN:  1. I personally reviewed and went over laboratory results with the patient.  2. Every 4 weeks PSA  3. Every 12 weeks: CBC diff, CMET  4. Continue Depo-[name] every 3 months. Last given on 05/13/2013  5. Depo-[name] today and repeated in 3 months 6. Labs today: CBC diff, CMET, PSA, Testosterone level, TSH 7. Continue Casodex daily  8. Rx for Zoloft 25 mg daily  for symptomatic hot flash treatment.  This will be a trial for effectiveness.  May try Effexor if it does not work. 9. Recommended OTC Preparation H for rectal burning, pruritis, and bleeding.  Evaluated by Dr. Malvin Johns in the past and negative.  10. Follow-up with PCP as directed.  Will defer D/C of BP medications to Dr. Juanetta Boone.  11. Discussed case with Dr. Juanetta Boone.  12. Return in 3 months for follow-up.   THERAPY PLAN:  From an oncology standpoint, Walter Boone is doing well.  However, he is having more fatigue than expected.  Will work on possible causes of this and see if we can make him feel better.   All questions were answered. The patient knows to call the clinic with any problems, questions or concerns. We can certainly see the patient much sooner if necessary.  Patient and plan discussed with Dr. Erline Hau and he is in agreement with the aforementioned.   KEFALAS,THOMAS

## 2013-08-13 NOTE — Patient Instructions (Addendum)
[facility] Discharge Instructions  RECOMMENDATIONS MADE BY THE CONSULTANT AND ANY TEST RESULTS WILL BE SENT TO YOUR REFERRING PHYSICIAN.  EXAM FINDINGS BY THE PHYSICIAN TODAY AND SIGNS OR SYMPTOMS TO REPORT TO CLINIC OR PRIMARY PHYSICIAN: Exam and discussion by Dellis Anes, PA-C.  MEDICATIONS PRESCRIBED:  Zoloft 25 mg daily for your hot flashes. Preparation H - for the burning and itching discomfort  INSTRUCTIONS GIVEN AND DISCUSSED: Let us know how you do with the Zoloft.   SPECIAL INSTRUCTIONS/FOLLOW-UP: Blood work monthly To be seen in follow-up in 3 months with next injection of [name].  Thank you for choosing [facility] to provide your oncology and hematology care.  To afford each patient quality time with our providers, please arrive at least 15 minutes before your scheduled appointment time.  With your help, our goal is to use those 15 minutes to complete the necessary work-up to ensure our physicians have the information they need to help with your evaluation and healthcare recommendations.    Effective January 1st, 2014, we ask that you re-schedule your appointment with our physicians should you arrive 10 or more minutes late for your appointment.  We strive to give you quality time with our providers, and arriving late affects you and other patients whose appointments are after yours.    Again, thank you for choosing [facility].  Our hope is that these requests will decrease the amount of time that you wait before being seen by our physicians.       _____________________________________________________________  Should you have questions after your visit to [facility], please contact our office at [phone] between the hours of 8:30 a.m. and 5:00 p.m.  Voicemails left after 4:30 p.m. will not be returned until the following business day.  For prescription refill requests, have your pharmacy contact our office  with your prescription refill request.

## 2013-08-13 NOTE — Progress Notes (Signed)
Walter Boone presented for labwork. Labs per MD order drawn via Peripheral Line 23 gauge needle inserted in left AC  Good blood return present. Procedure without incident.  Needle removed intact. Patient tolerated procedure well. Walter Boone presents today for injection per MD orders. [name] depot administered Im Z-track in right Gluteal. Administration without incident. Patient tolerated well.

## 2013-08-14 ENCOUNTER — Other Ambulatory Visit (HOSPITAL_COMMUNITY): Payer: Self-pay | Admitting: Oncology

## 2013-08-14 DIAGNOSIS — E876 Hypokalemia: Secondary | ICD-10-CM

## 2013-08-14 LAB — TESTOSTERONE: Testosterone: 46 ng/dL — ABNORMAL LOW (ref 300–890)

## 2013-08-14 LAB — TESTOSTERONE, FREE: Testosterone, Free: 13.2 pg/mL — ABNORMAL LOW (ref 47.0–244.0)

## 2013-08-14 LAB — TESTOSTERONE, % FREE: Testosterone-% Free: 2.9 % — ABNORMAL HIGH (ref 1.6–2.9)

## 2013-08-14 LAB — TSH: TSH: 0.962 u[IU]/mL (ref 0.350–4.500)

## 2013-08-14 MED ORDER — POTASSIUM CHLORIDE CRYS ER 20 MEQ PO TBCR: 20.0000 meq | EXTENDED_RELEASE_TABLET | Freq: Two times a day (BID) | ORAL | Status: DC

## 2013-08-15 ENCOUNTER — Other Ambulatory Visit (HOSPITAL_COMMUNITY): Payer: PRIVATE HEALTH INSURANCE

## 2013-08-19 ENCOUNTER — Other Ambulatory Visit (HOSPITAL_COMMUNITY): Payer: Self-pay | Admitting: Oncology

## 2013-08-19 ENCOUNTER — Encounter (HOSPITAL_COMMUNITY): Payer: Self-pay | Admitting: Oncology

## 2013-08-19 NOTE — Telephone Encounter (Signed)
Called to update med list.

## 2013-08-20 ENCOUNTER — Other Ambulatory Visit (HOSPITAL_COMMUNITY): Payer: Self-pay | Admitting: Oncology

## 2013-09-16 ENCOUNTER — Encounter (HOSPITAL_COMMUNITY): Payer: PRIVATE HEALTH INSURANCE | Attending: Oncology

## 2013-09-16 DIAGNOSIS — C61 Malignant neoplasm of prostate: Secondary | ICD-10-CM | POA: Insufficient documentation

## 2013-09-16 NOTE — Progress Notes (Signed)
Labs drawn today for psa 

## 2013-09-17 LAB — PSA: PSA: 0.19 ng/mL (ref <=4.00)

## 2013-09-19 ENCOUNTER — Other Ambulatory Visit (HOSPITAL_COMMUNITY): Payer: PRIVATE HEALTH INSURANCE

## 2013-10-15 ENCOUNTER — Encounter (HOSPITAL_COMMUNITY): Payer: PRIVATE HEALTH INSURANCE | Attending: Oncology

## 2013-10-15 DIAGNOSIS — C61 Malignant neoplasm of prostate: Secondary | ICD-10-CM | POA: Insufficient documentation

## 2013-10-15 NOTE — Progress Notes (Signed)
Labs drawn today for psa 

## 2013-10-16 LAB — PSA: PSA: 0.15 ng/mL (ref <=4.00)

## 2013-10-17 ENCOUNTER — Other Ambulatory Visit (HOSPITAL_COMMUNITY): Payer: PRIVATE HEALTH INSURANCE

## 2013-10-21 ENCOUNTER — Other Ambulatory Visit (HOSPITAL_COMMUNITY): Payer: Self-pay | Admitting: Oncology

## 2013-10-21 DIAGNOSIS — R232 Flushing: Secondary | ICD-10-CM

## 2013-10-21 MED ORDER — SERTRALINE HCL 25 MG PO TABS: 25.0000 mg | ORAL_TABLET | Freq: Every day | ORAL | Status: DC

## 2013-11-14 ENCOUNTER — Encounter (HOSPITAL_COMMUNITY): Payer: PRIVATE HEALTH INSURANCE | Attending: Hematology and Oncology

## 2013-11-14 DIAGNOSIS — C61 Malignant neoplasm of prostate: Secondary | ICD-10-CM

## 2013-11-14 LAB — CBC WITH DIFFERENTIAL/PLATELET
Basophils Absolute: 0.1 10*3/uL (ref 0.0–0.1)
Basophils Relative: 1 % (ref 0–1)
Eosinophils Absolute: 0.3 10*3/uL (ref 0.0–0.7)
Eosinophils Relative: 4 % (ref 0–5)
Lymphocytes Relative: 30 % (ref 12–46)
Lymphs Abs: 2.7 10*3/uL (ref 0.7–4.0)
MCH: 31.7 pg (ref 26.0–34.0)
MCV: 92 fL (ref 78.0–100.0)
Neutrophils Relative %: 60 % (ref 43–77)
Platelets: 401 10*3/uL — ABNORMAL HIGH (ref 150–400)
RBC: 4.39 MIL/uL (ref 4.22–5.81)
RDW: 12.5 % (ref 11.5–15.5)
WBC: 9 10*3/uL (ref 4.0–10.5)

## 2013-11-14 LAB — COMPREHENSIVE METABOLIC PANEL
ALT: 23 U/L (ref 0–53)
AST: 23 U/L (ref 0–37)
Albumin: 4.1 g/dL (ref 3.5–5.2)
Alkaline Phosphatase: 59 U/L (ref 39–117)
CO2: 28 mEq/L (ref 19–32)
Calcium: 9.7 mg/dL (ref 8.4–10.5)
Creatinine, Ser: 1.05 mg/dL (ref 0.50–1.35)
Glucose, Bld: 106 mg/dL — ABNORMAL HIGH (ref 70–99)
Potassium: 3.5 mEq/L (ref 3.5–5.1)
Sodium: 140 mEq/L (ref 135–145)
Total Protein: 7.8 g/dL (ref 6.0–8.3)

## 2013-11-14 NOTE — Addendum Note (Signed)
Addended by: Corena Herter D on: 11/14/2013 10:02 AM   Modules accepted: Orders

## 2013-11-14 NOTE — Progress Notes (Signed)
Labs drawn today for cbc/diff,cmp 

## 2013-11-15 LAB — PSA: PSA: 0.13 ng/mL (ref <=4.00)

## 2013-11-17 NOTE — Progress Notes (Signed)
Walter Maudlin, MD [address] [place] [address]  Prostate cancer - Plan: SCHEDULING COMMUNICATION INJECTION  Prostate carcinoma, recurrent - Plan: DISCONTINUED: leuprolide ([name]) injection 30 mg  CURRENT THERAPY:On Casodex daily and [name] every 3 months (last administered on 08/13/2013).  INTERVAL HISTORY: Walter Boone 62 y.o. male returns for  regular  visit for followup of recurrent prostate cancer. Now on Depo-[name] every 3 months and Casodex daily.   I personally reviewed and went over laboratory results with the patient.  PSA is very stable at 0.13.  Lab Results  Component Value Date   PSA 0.13 11/14/2013   PSA 0.15 10/15/2013   PSA 0.19 09/16/2013   Walter Boone reports that he is not sleeping well.  He admits to 5-6 hours of sleep nightly.  He admits that his wife reports that he snores throughout the night and even needs to reposition her body in bed to not hear his snoring.  I question whether he has sleep apnea and this may need to be further evaluated by his PCP, Dr. Juanetta Gosling.  I recommended that he follow-up with Dr. Juanetta Gosling because he may benefit from a sleep study.     He admits to fatigue and tiredness which leads to agitation.  I wonder if this is secondary to his lack of restfulness.  From an oncology standpoint, he denies any complaints and ROS questioning is negative.   Past Medical History  Diagnosis Date  . Prostate cancer   . Fatty liver   . Hypertension   . Hyperlipidemia   . Hemorrhoids     history  . Prostate cancer 06/20/2011  . Prostate carcinoma, recurrent 08/10/2012  . Gynecomastia, male 01/11/2013    Secondary to prostate ca Tx.     has H N P-CERVICAL; CERVICAL SPASM; Prostate cancer; and Gynecomastia, male on his problem list.     has No Known Allergies.  We administered leuprolide.  Past Surgical History  Procedure Laterality Date  . Prostate biopsy  11/06  . Esophagogastroduodenoscopy    . Knee surgery        left  . Ankle surgery  1987    left  . Hernia repair  2010  . Colonoscopy  2008    Denies any headaches, dizziness, double vision, fevers, chills, night sweats, nausea, vomiting, diarrhea, constipation, chest pain, heart palpitations, shortness of breath, blood in stool, black tarry stool, urinary pain, urinary burning, urinary frequency, hematuria.   PHYSICAL EXAMINATION  ECOG PERFORMANCE STATUS: 1 - Symptomatic but completely ambulatory  Filed Vitals:   11/18/13 1453  BP: 127/80  Pulse: 88  Temp: 97.9 F (36.6 C)  Resp: 20    GENERAL:alert, no distress, well nourished, well developed, comfortable, cooperative, truncal obesity and smiling SKIN: skin color, texture, turgor are normal, no rashes or significant lesions HEAD: Normocephalic, No masses, lesions, tenderness or abnormalities EYES: normal, PERRLA, EOMI, Conjunctiva are pink and non-injected EARS: External ears normal OROPHARYNX:mucous membranes are moist  NECK: supple, no adenopathy, thyroid normal size, non-tender, without nodularity, no stridor, non-tender, trachea midline LYMPH:  no palpable lymphadenopathy BREAST:not examined LUNGS: clear to auscultation  HEART: regular rate & rhythm, no murmurs and no gallops ABDOMEN:abdomen soft, non-tender, obese and normal bowel sounds BACK: Back symmetric, no curvature. EXTREMITIES:less then 2 second capillary refill, no joint deformities, effusion, or inflammation, no edema, no skin discoloration, no clubbing, no cyanosis  NEURO: alert & oriented x 3 with fluent speech, no focal motor/sensory deficits, gait normal  LABORATORY DATA: CBC    Component Value Date/Time   WBC 9.0 11/14/2013 0958   RBC 4.39 11/14/2013 0958   HGB 13.9 11/14/2013 0958   HCT 40.4 11/14/2013 0958   PLT 401* 11/14/2013 0958   MCV 92.0 11/14/2013 0958   MCH 31.7 11/14/2013 0958   MCHC 34.4 11/14/2013 0958   RDW 12.5 11/14/2013 0958   LYMPHSABS 2.7 11/14/2013 0958   MONOABS 0.6 11/14/2013 0958    EOSABS 0.3 11/14/2013 0958   BASOSABS 0.1 11/14/2013 0958    Lab Results  Component Value Date   PSA 0.13 11/14/2013   PSA 0.15 10/15/2013   PSA 0.19 09/16/2013     ASSESSMENT:  1. Recurrent prostate cancer. Now on Depo-[name] every 3 months and Casodex daily.  2. B/L gynecomastia with some tenderness  3. Constipation, well controlled with Prune Juice +/- Stool softener  4. Rectal bleeding, burning, itching  5. Moderate-Severe fatigue. 6. Lack of sleep  Patient Active Problem List   Diagnosis Date Noted  . Gynecomastia, male 01/11/2013  . Prostate cancer 06/20/2011  . H N P-CERVICAL 10/05/2009  . CERVICAL SPASM 10/05/2009     PLAN:  1. I personally reviewed and went over laboratory results with the patient.  2. Every 4 weeks PSA  3. Every 12 weeks: CBC diff, CMET  4. Continue Depo-[name] every 3 months. Last given on 08/13/2013  5. Depo-[name] today and repeated in 3 months  6. Continue Casodex daily  7. Follow-up with PCP as directed.  8. Return in 3 months for follow-up.   THERAPY PLAN:  From an oncology standpoint, Walter Boone is doing well.  He is to continue with Depo-[name] every 3 months and Casodex daily.  I recommend he follow-up with PCP regarding tiredness.  I wonder if the patient would benefit from a sleep study to rule out.   All questions were answered. The patient knows to call the clinic with any problems, questions or concerns. We can certainly see the patient much sooner if necessary.  Patient and plan discussed with Dr. Alla German and he is in agreement with the aforementioned.   Jacquie Lukes

## 2013-11-18 ENCOUNTER — Encounter (HOSPITAL_BASED_OUTPATIENT_CLINIC_OR_DEPARTMENT_OTHER): Payer: PRIVATE HEALTH INSURANCE | Admitting: Oncology

## 2013-11-18 VITALS — BP 127/80 | HR 88 | Temp 97.9°F | Resp 20 | Wt 223.2 lb

## 2013-11-18 DIAGNOSIS — C61 Malignant neoplasm of prostate: Secondary | ICD-10-CM

## 2013-11-18 DIAGNOSIS — R5381 Other malaise: Secondary | ICD-10-CM

## 2013-11-18 DIAGNOSIS — Z5111 Encounter for antineoplastic chemotherapy: Secondary | ICD-10-CM

## 2013-11-18 MED ORDER — LEUPROLIDE ACETATE (3 MONTH) 22.5 MG IM KIT
30.0000 mg | PACK | Freq: Once | INTRAMUSCULAR | Status: DC
  Administered 2013-11-18: 22.5 mg via INTRAMUSCULAR
  Filled 2013-11-18: qty 45

## 2013-11-18 MED ORDER — LEUPROLIDE ACETATE (3 MONTH) 22.5 MG IM KIT
22.5000 mg | PACK | Freq: Once | INTRAMUSCULAR | Status: DC
  Filled 2013-11-18: qty 22.5

## 2013-11-18 NOTE — Progress Notes (Signed)
Walter Boone presents today for injection per MD orders. Depo [name] 22.5mg  administered IM in left Gluteal muscle. Administration without incident. Patient tolerated well but did say that it hurt. Site without bleeding/bruising/edema. Site WNL. Bandaid applied.

## 2013-11-18 NOTE — Addendum Note (Signed)
Encounter addended by: Wayland Denis, RPH on: 11/18/2013  4:11 PM<BR>     Documentation filed: Orders

## 2013-11-18 NOTE — Patient Instructions (Signed)
[facility] Discharge Instructions  RECOMMENDATIONS MADE BY THE CONSULTANT AND ANY TEST RESULTS WILL BE SENT TO YOUR REFERRING PHYSICIAN.  EXAM FINDINGS BY THE PHYSICIAN TODAY AND SIGNS OR SYMPTOMS TO REPORT TO CLINIC OR PRIMARY PHYSICIAN:    Continue Casodex as prescribed.  You will receive Depo [name] today.   Return in 3 months to see [employer] monthly.    Thank you for choosing [facility] to provide your oncology and hematology care.  To afford each patient quality time with our providers, please arrive at least 15 minutes before your scheduled appointment time.  With your help, our goal is to use those 15 minutes to complete the necessary work-up to ensure our physicians have the information they need to help with your evaluation and healthcare recommendations.    Effective January 1st, 2014, we ask that you re-schedule your appointment with our physicians should you arrive 10 or more minutes late for your appointment.  We strive to give you quality time with our providers, and arriving late affects you and other patients whose appointments are after yours.    Again, thank you for choosing [facility].  Our hope is that these requests will decrease the amount of time that you wait before being seen by our physicians.       _____________________________________________________________  Should you have questions after your visit to [facility], please contact our office at [phone] between the hours of 8:30 a.m. and 5:00 p.m.  Voicemails left after 4:30 p.m. will not be returned until the following business day.  For prescription refill requests, have your pharmacy contact our office with your prescription refill request.

## 2013-11-19 DIAGNOSIS — Z87828 Personal history of other (healed) physical injury and trauma: Secondary | ICD-10-CM

## 2013-11-19 HISTORY — DX: Personal history of other (healed) physical injury and trauma: Z87.828

## 2013-11-21 ENCOUNTER — Emergency Department (HOSPITAL_COMMUNITY)
Admission: EM | Admit: 2013-11-21 | Discharge: 2013-11-21 | Disposition: A | Discharge status: Home / Self Care | Payer: PRIVATE HEALTH INSURANCE | Attending: Emergency Medicine | Admitting: Emergency Medicine

## 2013-11-21 ENCOUNTER — Emergency Department (HOSPITAL_COMMUNITY): Payer: PRIVATE HEALTH INSURANCE

## 2013-11-21 ENCOUNTER — Encounter (HOSPITAL_COMMUNITY): Payer: Self-pay | Admitting: Emergency Medicine

## 2013-11-21 DIAGNOSIS — IMO0002 Reserved for concepts with insufficient information to code with codable children: Secondary | ICD-10-CM | POA: Insufficient documentation

## 2013-11-21 DIAGNOSIS — Z8719 Personal history of other diseases of the digestive system: Secondary | ICD-10-CM | POA: Insufficient documentation

## 2013-11-21 DIAGNOSIS — Y939 Activity, unspecified: Secondary | ICD-10-CM | POA: Insufficient documentation

## 2013-11-21 DIAGNOSIS — Z862 Personal history of diseases of the blood and blood-forming organs and certain disorders involving the immune mechanism: Secondary | ICD-10-CM | POA: Insufficient documentation

## 2013-11-21 DIAGNOSIS — R1031 Right lower quadrant pain: Secondary | ICD-10-CM

## 2013-11-21 DIAGNOSIS — Y9269 Other specified industrial and construction area as the place of occurrence of the external cause: Secondary | ICD-10-CM | POA: Insufficient documentation

## 2013-11-21 DIAGNOSIS — W010XXA Fall on same level from slipping, tripping and stumbling without subsequent striking against object, initial encounter: Secondary | ICD-10-CM | POA: Insufficient documentation

## 2013-11-21 DIAGNOSIS — Y990 Civilian activity done for income or pay: Secondary | ICD-10-CM | POA: Insufficient documentation

## 2013-11-21 DIAGNOSIS — Z79899 Other long term (current) drug therapy: Secondary | ICD-10-CM | POA: Insufficient documentation

## 2013-11-21 DIAGNOSIS — Z87891 Personal history of nicotine dependence: Secondary | ICD-10-CM | POA: Insufficient documentation

## 2013-11-21 DIAGNOSIS — W19XXXA Unspecified fall, initial encounter: Secondary | ICD-10-CM

## 2013-11-21 DIAGNOSIS — Z8546 Personal history of malignant neoplasm of prostate: Secondary | ICD-10-CM | POA: Insufficient documentation

## 2013-11-21 DIAGNOSIS — M533 Sacrococcygeal disorders, not elsewhere classified: Secondary | ICD-10-CM

## 2013-11-21 DIAGNOSIS — Z8639 Personal history of other endocrine, nutritional and metabolic disease: Secondary | ICD-10-CM | POA: Insufficient documentation

## 2013-11-21 DIAGNOSIS — I10 Essential (primary) hypertension: Secondary | ICD-10-CM | POA: Insufficient documentation

## 2013-11-21 MED ORDER — CYCLOBENZAPRINE HCL 5 MG PO TABS: 5.0000 mg | ORAL_TABLET | Freq: Three times a day (TID) | ORAL | Status: DC | PRN

## 2013-11-21 MED ORDER — NAPROXEN 500 MG PO TABS: 500.0000 mg | ORAL_TABLET | Freq: Two times a day (BID) | ORAL | Status: DC

## 2013-11-21 MED ORDER — TRAMADOL HCL 50 MG PO TABS: 100.0000 mg | ORAL_TABLET | Freq: Four times a day (QID) | ORAL | Status: DC | PRN

## 2013-11-21 MED ORDER — KETOROLAC TROMETHAMINE 60 MG/2ML IM SOLN
60.0000 mg | Freq: Once | INTRAMUSCULAR | Status: AC
  Administered 2013-11-21: 60 mg via INTRAMUSCULAR
  Filled 2013-11-21: qty 2

## 2013-11-21 MED ORDER — DIAZEPAM 5 MG/ML IJ SOLN
5.0000 mg | Freq: Once | INTRAMUSCULAR | Status: AC
  Administered 2013-11-21: 5 mg via INTRAMUSCULAR
  Filled 2013-11-21: qty 2

## 2013-11-21 NOTE — ED Notes (Signed)
Pt slipped and fell at work 2 days ago. C/o lower back pain. Pt had drug screen yesterday for workman's comp. Pt also sates pain to right groin with hx of surgery to same area. States pain began after falling.

## 2013-11-21 NOTE — ED Notes (Signed)
Pt is c/o pain in lower lumbar region sp fall from standing position. Pt states slipped and fell to floor in sitting position. Denies LOC

## 2013-11-21 NOTE — ED Provider Notes (Signed)
CSN: [id number]     Arrival date & time 11/21/13  1417 History   First MD Initiated Contact with Patient 11/21/13 1457  This chart was scribed for Ward Givens, MD by Valera Castle, ED Scribe. This patient was seen in room APA11/APA11 and the patient's care was started at 3:26 PM.     Chief Complaint  Patient presents with  . Back Pain  . Fall   The history is provided by the patient. No language interpreter was used.   HPI Comments: Walter Boone is a 62 y.o. male who presents to the Emergency Department complaining of fall, after slipping on spilled fluid at [facility]. 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 He reports he caught himself on a table close by with his right arm and doing the splits. He reports his right leg went out in front when he did the splits. He also reports sudden, moderate, constant lower back pain from the fall. He reports that when he arches his back the pain is exacerbated. He also reports that prolonged sitting and walking exacerbates the pain. He reports the pain intermittently radiating down his right lateral thigh to his knee. He also reports right groin pain, from doing the splits. He reports Dr. Malvin Johns had done inguinal hernia repair on right side, about 3-4 years ago. He reports his groin pain had returned about 1 year ago and he saw Dr Malvin Johns who thought he was having a small muscle tear. He denies extremity pain, numbness, tingling, bowel and bladder incontinence, LOC, head trauma, and any other associated symptoms. He denies smoking, EtOH use. He reports a h/o prostate cancer.   PCP - [name], MD  Past Medical History  Diagnosis Date  . Prostate cancer   . Fatty liver   . Hypertension   . Hyperlipidemia   . Hemorrhoids     history  . Prostate cancer 06/20/2011  . Prostate carcinoma, recurrent 08/10/2012  . Gynecomastia, male 01/11/2013    Secondary to prostate ca Tx.    Past Surgical History  Procedure  Laterality Date  . Prostate biopsy  11/06  . Esophagogastroduodenoscopy    . Knee surgery      left  . Ankle surgery  1987    left  . Hernia repair  2010  . Colonoscopy  2008   Family History  Problem Relation Age of Onset  . Diabetes Father   . Cancer Sister   . Diabetes Brother    History  Substance Use Topics  . Smoking status: Former Smoker -- 2.50 packs/day for 3 years  . Smokeless tobacco: Never Used  . 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  Genitourinary:       Negative for bladder incontinence.  Musculoskeletal: Positive for back pain (lower) and myalgias (right groin). Negative for gait problem, neck pain and neck stiffness.  Neurological: Negative for syncope, numbness (and tingling) and headaches.  All other systems reviewed and are negative.    Allergies  Review of patient's allergies indicates no known allergies.  Home Medications   Current Outpatient Rx  Name  Route  Sig  Dispense  Refill  . hydrochlorothiazide (HYDRODIURIL) 25 MG tablet   Oral   Take 25 mg by mouth daily.         Marland Kitchen  losartan (COZAAR) 100 MG tablet   Oral   Take 100 mg by mouth daily.         . Melatonin 5 MG TABS   Oral   Take 2 tablets by mouth at bedtime.         . sertraline (ZOLOFT) 25 MG tablet   Oral   Take 1 tablet (25 mg total) by mouth daily.   30 tablet   1   . bicalutamide (CASODEX) 50 MG tablet      TAKE ONE TABLET BY MOUTH EVERY DAY   30 tablet   4   . ibuprofen (ADVIL,MOTRIN) 800 MG tablet   Oral   Take by mouth as needed.           BP 133/91  Pulse 74  Temp(Src) 97.8 F (36.6 C) (Oral)  Resp 18  Ht 5\' 6"  (1.676 m)  Wt 225 lb (102.059 kg)  BMI 36.33 kg/m2  SpO2 99%  Vital signs normal    Physical Exam  Nursing note and vitals reviewed. Constitutional: He is oriented to person, place, and time.  He appears well-developed and well-nourished.  Non-toxic appearance. He does not appear ill. No distress.  HENT:  Head: Normocephalic and atraumatic.  Right Ear: External ear normal.  Left Ear: External ear normal.  Nose: Nose normal. No mucosal edema or rhinorrhea.  Mouth/Throat: Oropharynx is clear and moist and mucous membranes are normal. No dental abscesses or uvula swelling.  Eyes: Conjunctivae and EOM are normal. Pupils are equal, round, and reactive to light.  Neck: Normal range of motion and full passive range of motion without pain. Neck supple.  Cardiovascular: Normal rate, regular rhythm and normal heart sounds.  Exam reveals no gallop and no friction rub.   No murmur heard. Pulmonary/Chest: Effort normal and breath sounds normal. No respiratory distress. He has no wheezes. He has no rhonchi. He has no rales. He exhibits no tenderness and no crepitus.  Abdominal: Soft. Normal appearance and bowel sounds are normal. He exhibits no distension. There is no tenderness. There is no rebound and no guarding.  Musculoskeletal: Normal range of motion. He exhibits tenderness. He exhibits no edema.       Back:  Moves all extremities well. Tender over right SI joint. Otherwise thoracic spine is non tender. SLR positive on left. Right inguinal area, no masses or swelling. Tender when pt strains.   Neurological: He is alert and oriented to person, place, and time. He has normal strength. No cranial nerve deficit.  Patellar reflexes 2+ equal.   Skin: Skin is warm, dry and intact. No rash noted. No erythema. No pallor.  Psychiatric: He has a normal mood and affect. His speech is normal and behavior is normal. His mood appears not anxious.    ED Course  Procedures (including critical care time) Medications  ketorolac (TORADOL) injection 60 mg (60 mg Intramuscular Given 11/21/13 1551)  diazepam (VALIUM) injection 5 mg (5 mg Intramuscular Given 11/21/13 1548)     DIAGNOSTIC STUDIES: Oxygen  Saturation is 99% on room air, normal by my interpretation.    COORDINATION OF CARE: 3:34 PM-Discussed treatment plan which includes DG lumbar, Toradol, and Valium with pt at bedside and pt agreed to plan.   5:00 PM - Discussed xray results with pt, indicating no fracture. Pt states the medications have helped his pain. Performed exam over right hernia repair. Pt has no bulging or masses with straining.    Labs Review Labs Reviewed - No data  to display Imaging Review Dg Lumbar Spine Complete  11/21/2013   CLINICAL DATA:  Back pain.  Fall 2 days ago.  EXAM: LUMBAR SPINE - COMPLETE 4+ VIEW  COMPARISON:  CT 07/2012  FINDINGS: AP, lateral and oblique images of the lumbar spine were obtained. There are chronic bilateral pars defects at L5 with minimal anterolisthesis at L5-S1. There is disc space narrowing with vacuum disc at L2-L3. Marked endplate degenerative changes along the anterior aspect of L2-L3. The vertebral body heights are maintained.  IMPRESSION: No acute bone abnormality to the lumbar spine.  Chronic bilateral pars defects at L5 without significant anterolisthesis   Electronically Signed   By: Richarda Overlie M.D.   On: 11/21/2013 16:51    EKG Interpretation   None       MDM   1. Fall, initial encounter   2. Sacroiliac joint pain   3. Right inguinal pain    New Prescriptions   CYCLOBENZAPRINE (FLEXERIL) 5 MG TABLET    Take 1 tablet (5 mg total) by mouth 3 (three) times daily as needed for muscle spasms.   NAPROXEN (NAPROSYN) 500 MG TABLET    Take 1 tablet (500 mg total) by mouth 2 (two) times daily.   TRAMADOL (ULTRAM) 50 MG TABLET    Take 2 tablets (100 mg total) by mouth every 6 (six) hours as needed.    Plan discharge  Devoria Albe, MD, FACEP    I personally performed the services described in this documentation, which was scribed in my presence. The recorded information has been reviewed and considered.  Devoria Albe, MD, Armando Gang    Ward Givens, MD 11/21/13 [phone]

## 2013-11-21 NOTE — ED Notes (Signed)
Pt states had drug screen at urgent care before coming to er

## 2013-12-16 ENCOUNTER — Encounter (HOSPITAL_COMMUNITY): Payer: PRIVATE HEALTH INSURANCE | Attending: Oncology

## 2013-12-16 DIAGNOSIS — C61 Malignant neoplasm of prostate: Secondary | ICD-10-CM | POA: Insufficient documentation

## 2013-12-16 LAB — PSA: PSA: 0.11 ng/mL (ref <=4.00)

## 2013-12-16 NOTE — Progress Notes (Signed)
Labs drawn today for psa 

## 2013-12-19 ENCOUNTER — Other Ambulatory Visit (HOSPITAL_COMMUNITY): Payer: PRIVATE HEALTH INSURANCE

## 2014-01-13 ENCOUNTER — Encounter (HOSPITAL_COMMUNITY): Payer: PRIVATE HEALTH INSURANCE | Attending: Oncology

## 2014-01-13 DIAGNOSIS — C61 Malignant neoplasm of prostate: Secondary | ICD-10-CM | POA: Insufficient documentation

## 2014-01-13 LAB — PSA: PSA: 0.12 ng/mL (ref <=4.00)

## 2014-01-13 NOTE — Progress Notes (Signed)
Labs drawn today for psa

## 2014-02-03 ENCOUNTER — Other Ambulatory Visit (HOSPITAL_COMMUNITY): Payer: Self-pay | Admitting: Oncology

## 2014-02-03 DIAGNOSIS — C61 Malignant neoplasm of prostate: Secondary | ICD-10-CM

## 2014-02-03 MED ORDER — BICALUTAMIDE 50 MG PO TABS
50.0000 mg | ORAL_TABLET | Freq: Every day | ORAL | Status: DC
Start: 2014-02-03 — End: 2014-09-30

## 2014-02-10 ENCOUNTER — Other Ambulatory Visit (HOSPITAL_COMMUNITY): Payer: PRIVATE HEALTH INSURANCE

## 2014-02-11 ENCOUNTER — Encounter (HOSPITAL_COMMUNITY): Payer: PRIVATE HEALTH INSURANCE | Attending: Oncology

## 2014-02-11 DIAGNOSIS — C61 Malignant neoplasm of prostate: Secondary | ICD-10-CM

## 2014-02-11 LAB — CBC WITH DIFFERENTIAL/PLATELET
BASOS ABS: 0 10*3/uL (ref 0.0–0.1)
Basophils Relative: 1 % (ref 0–1)
EOS PCT: 5 % (ref 0–5)
Eosinophils Absolute: 0.4 10*3/uL (ref 0.0–0.7)
HEMATOCRIT: 38.7 % — AB (ref 39.0–52.0)
Hemoglobin: 13.4 g/dL (ref 13.0–17.0)
LYMPHS PCT: 29 % (ref 12–46)
Lymphs Abs: 2.5 10*3/uL (ref 0.7–4.0)
MCH: 31.6 pg (ref 26.0–34.0)
MCHC: 34.6 g/dL (ref 30.0–36.0)
MCV: 91.3 fL (ref 78.0–100.0)
Monocytes Absolute: 0.5 10*3/uL (ref 0.1–1.0)
Monocytes Relative: 5 % (ref 3–12)
Neutro Abs: 5.2 10*3/uL (ref 1.7–7.7)
Neutrophils Relative %: 61 % (ref 43–77)
PLATELETS: 369 10*3/uL (ref 150–400)
RBC: 4.24 MIL/uL (ref 4.22–5.81)
RDW: 13 % (ref 11.5–15.5)
WBC: 8.6 10*3/uL (ref 4.0–10.5)

## 2014-02-11 LAB — COMPREHENSIVE METABOLIC PANEL
ALT: 20 U/L (ref 0–53)
AST: 20 U/L (ref 0–37)
Albumin: 3.9 g/dL (ref 3.5–5.2)
Alkaline Phosphatase: 60 U/L (ref 39–117)
BUN: 24 mg/dL — ABNORMAL HIGH (ref 6–23)
CO2: 27 mEq/L (ref 19–32)
Calcium: 9.5 mg/dL (ref 8.4–10.5)
Chloride: 105 mEq/L (ref 96–112)
Creatinine, Ser: 0.96 mg/dL (ref 0.50–1.35)
GFR calc Af Amer: >90 mL/min (ref >90)
GFR calc non Af Amer: 88 mL/min — ABNORMAL LOW (ref >90)
Glucose, Bld: 140 mg/dL — ABNORMAL HIGH (ref 70–99)
Potassium: 4.6 mEq/L (ref 3.7–5.3)
SODIUM: 141 meq/L (ref 137–147)
TOTAL PROTEIN: 7.7 g/dL (ref 6.0–8.3)
Total Bilirubin: 0.4 mg/dL (ref 0.3–1.2)

## 2014-02-11 NOTE — Progress Notes (Signed)
Walter Boone presented for labwork. Labs per MD order drawn via Peripheral Line 23 gauge needle inserted in left AC.  Good blood return present. Procedure without incident.  Needle removed intact. Patient tolerated procedure well.

## 2014-02-12 ENCOUNTER — Ambulatory Visit (HOSPITAL_COMMUNITY): Payer: PRIVATE HEALTH INSURANCE

## 2014-02-12 ENCOUNTER — Ambulatory Visit (HOSPITAL_COMMUNITY): Payer: PRIVATE HEALTH INSURANCE | Admitting: Oncology

## 2014-02-12 ENCOUNTER — Encounter (HOSPITAL_BASED_OUTPATIENT_CLINIC_OR_DEPARTMENT_OTHER): Payer: PRIVATE HEALTH INSURANCE | Admitting: Oncology

## 2014-02-12 ENCOUNTER — Encounter (HOSPITAL_COMMUNITY): Payer: Self-pay | Admitting: Oncology

## 2014-02-12 ENCOUNTER — Encounter (HOSPITAL_BASED_OUTPATIENT_CLINIC_OR_DEPARTMENT_OTHER): Payer: PRIVATE HEALTH INSURANCE

## 2014-02-12 VITALS — Wt 227.0 lb

## 2014-02-12 DIAGNOSIS — R5381 Other malaise: Secondary | ICD-10-CM

## 2014-02-12 DIAGNOSIS — Z5111 Encounter for antineoplastic chemotherapy: Secondary | ICD-10-CM

## 2014-02-12 DIAGNOSIS — N62 Hypertrophy of breast: Secondary | ICD-10-CM

## 2014-02-12 DIAGNOSIS — R5383 Other fatigue: Secondary | ICD-10-CM

## 2014-02-12 DIAGNOSIS — M545 Low back pain, unspecified: Secondary | ICD-10-CM

## 2014-02-12 DIAGNOSIS — R61 Generalized hyperhidrosis: Secondary | ICD-10-CM

## 2014-02-12 DIAGNOSIS — C61 Malignant neoplasm of prostate: Secondary | ICD-10-CM

## 2014-02-12 LAB — PSA: PSA: 0.12 ng/mL (ref <=4.00)

## 2014-02-12 MED ORDER — LEUPROLIDE ACETATE (3 MONTH) 22.5 MG IM KIT
30.0000 mg | PACK | Freq: Once | INTRAMUSCULAR | Status: DC
  Filled 2014-02-12: qty 45

## 2014-02-12 MED ORDER — LEUPROLIDE ACETATE (4 MONTH) 30 MG IM KIT
30.0000 mg | PACK | Freq: Once | INTRAMUSCULAR | Status: AC
  Administered 2014-02-12: 30 mg via INTRAMUSCULAR
  Filled 2014-02-12: qty 30

## 2014-02-12 NOTE — Progress Notes (Signed)
Walter Bogus, MD Walter Boone Walter Boone [id number]  Prostate cancer  Prostate carcinoma, recurrent - Plan: DG Bone Density, PSA, DISCONTINUED: leuprolide ([name]) injection 30 mg  CURRENT THERAPY: On Casodex daily and [name] every 3 months (last administered on 11/18/2013).  INTERVAL HISTORY: Walter Boone 63 y.o. male returns for  regular  visit for followup of recurrent prostate cancer. Now on Depo-[name] every 3 months and Casodex daily.   I personally reviewed and went over laboratory results with the patient.  The results are noted within this dictation.  Lab Results  Component Value Date   PSA 0.12 02/11/2014   PSA 0.12 01/13/2014   PSA 0.11 12/16/2013   Walter Boone reported to the [facility] ED on 11/21/13 for back pain and a fall from slipping on a spilled fluid at [facility]. Lumbar xray is negative for any acute findings.   I personally reviewed and went over radiographic studies with the patient.  The results are noted within this dictation.  I've encouraged the patient to discuss his Workmen's Compensation case with an attorney because the patient is having difficulty with [employer]  He may be entitled to a few rights that are being infringed upon.  The patient continues to have fatigue and is having a difficult time maintaining his work activities. She questions me about disability, from an oncology standpoint he would not qualify and am unable to provide him information that would justify total disability.  Counts of his 2 chemotherapeutic medications, Depo-[name] is most likely a medication to contribute to his fatigue if this is chemotherapy induced. Depo-[name] is not a cytotoxic agent is a LHRH antagonist. According to up-to-date, less than 15% of patients on Depo-[name] experience fatigue. Casodex is not known to cause fatigue.  He does not want to change any therapy at this point time which is absolutely reasonable.  He continues to have  gynecomastia which is tender. This is secondary to Depo-[name].  Additionally, he notes hot flashes which infringed upon his ability to sleep at night.  We've tried Zoloft for hot flash prevention without any significant improvement. We can always try Effexor but he has declined at this point time.  I provided patient education regarding bone density and the possibility that Depo-[name] can cause increased risk for osteoporosis. With his low back pain which is likely from his injury at work, in addition to his [facility] antagonist therapy, it is reasonable to perform a bone density. If he is found to be osteoporotic, he would be a good candidate for Prolia as this could be given with every other Depo-[name] injection.  Oncologically, the patient denies any complaints and ROS questioning is negative.  Past Medical History  Diagnosis Date  . Prostate cancer   . Fatty liver   . Hypertension   . Hyperlipidemia   . Hemorrhoids     history  . Prostate cancer 06/20/2011  . Prostate carcinoma, recurrent 08/10/2012  . Gynecomastia, male 01/11/2013    Secondary to prostate ca Tx.     has H N P-CERVICAL; CERVICAL SPASM; Prostate cancer; and Gynecomastia, male on his problem list.     has No Known Allergies.  Walter Boone had no medications administered during this visit.  Past Surgical History  Procedure Laterality Date  . Prostate biopsy  11/06  . Esophagogastroduodenoscopy    . Knee surgery      left  . Ankle surgery  1987    left  . Hernia repair  2010  . Colonoscopy  2008    Denies any headaches, dizziness, double vision, fevers, chills, night sweats, nausea, vomiting, diarrhea, constipation, chest pain, heart palpitations, shortness of breath, blood in stool, black tarry stool, urinary pain, urinary burning, urinary frequency, hematuria.   PHYSICAL EXAMINATION  ECOG PERFORMANCE STATUS: 2 - Symptomatic, <50% confined to bed  There were no vitals filed for this visit.  GENERAL:alert,  no distress, well nourished, well developed, comfortable, cooperative, obese and smiling SKIN: skin color, texture, turgor are normal, no rashes or significant lesions HEAD: Normocephalic, No masses, lesions, tenderness or abnormalities EYES: normal, PERRLA, EOMI, Conjunctiva are pink and non-injected EARS: External ears normal OROPHARYNX:lips, buccal mucosa, and tongue normal and mucous membranes are moist  NECK: supple, no adenopathy, thyroid normal size, non-tender, without nodularity, no stridor, non-tender, trachea midline LYMPH:  no palpable lymphadenopathy BREAST:not examined LUNGS: clear to auscultation  HEART: regular rate & rhythm, no murmurs, no gallops, S1 normal and S2 normal ABDOMEN:abdomen soft, non-tender, obese and normal bowel sounds BACK: Back symmetric, no curvature. EXTREMITIES:less then 2 second capillary refill, no joint deformities, effusion, or inflammation, no skin discoloration, no clubbing, no cyanosis  NEURO: alert & oriented x 3 with fluent speech, no focal motor/sensory deficits, gait normal   LABORATORY DATA: CBC    Component Value Date/Time   WBC 8.6 02/11/2014 1005   RBC 4.24 02/11/2014 1005   HGB 13.4 02/11/2014 1005   HCT 38.7* 02/11/2014 1005   PLT 369 02/11/2014 1005   MCV 91.3 02/11/2014 1005   MCH 31.6 02/11/2014 1005   MCHC 34.6 02/11/2014 1005   RDW 13.0 02/11/2014 1005   LYMPHSABS 2.5 02/11/2014 1005   MONOABS 0.5 02/11/2014 1005   EOSABS 0.4 02/11/2014 1005   BASOSABS 0.0 02/11/2014 1005      Chemistry      Component Value Date/Time   NA 141 02/11/2014 1005   K 4.6 02/11/2014 1005   CL 105 02/11/2014 1005   CO2 27 02/11/2014 1005   BUN 24* 02/11/2014 1005   CREATININE 0.96 02/11/2014 1005      Component Value Date/Time   CALCIUM 9.5 02/11/2014 1005   ALKPHOS 60 02/11/2014 1005   AST 20 02/11/2014 1005   ALT 20 02/11/2014 1005   BILITOT 0.4 02/11/2014 1005     Lab Results  Component Value Date   PSA 0.12 02/11/2014   PSA 0.12 01/13/2014   PSA 0.11 12/16/2013      RADIOGRAPHIC STUDIES:  11/21/2013  CLINICAL DATA: Back pain. Fall 2 days ago.  EXAM:  LUMBAR SPINE - COMPLETE 4+ VIEW  COMPARISON: CT 07/2012  FINDINGS:  AP, lateral and oblique images of the lumbar spine were obtained.  There are chronic bilateral pars defects at L5 with minimal  anterolisthesis at L5-S1. There is disc space narrowing with vacuum  disc at L2-L3. Marked endplate degenerative changes along the  anterior aspect of L2-L3. The vertebral body heights are maintained.  IMPRESSION:  No acute bone abnormality to the lumbar spine.  Chronic bilateral pars defects at L5 without significant  anterolisthesis  Electronically Signed  By: Markus Daft M.D.  On: 11/21/2013 16:51    ASSESSMENT:  1. Recurrent prostate cancer. Now on Depo-[name] every 3 months and Casodex daily.  2. B/L gynecomastia with some tenderness, secondary to Depo-[name] 3. Hot flashes, secondary to depo-lupron 4. Fatigue, possibly from Depo-[name].   Patient Active Problem List   Diagnosis Date Noted  . Gynecomastia, male 01/11/2013  . Prostate cancer 06/20/2011  .  H N P-CERVICAL 10/05/2009  . CERVICAL SPASM 10/05/2009     PLAN:  1. I personally reviewed and went over laboratory results with the patient.  The results are noted within this dictation. 2. I personally reviewed and went over radiographic studies with the patient.  The results are noted within this dictation.   3. Chart is reviewed 4. ED note is appreciated 5. Depo-[name] injection today. 6. Depo-[name] in 3 months 7. Continue with Casodex daily 8. Labs in 3 months: CBC diff, CMET, PSA 9. Labs every 4 weeks at the patient's request: PSA 10. Bone density examination given LHRH antagonist therapy and his low back pain.   11. Return in 3 months for follow-up.   THERAPY PLAN:  He is tolerating therapy well.  His PSAs are very stable.  He will continue with Depo-[name] every 3 months (22.5 mg) and Casodex daily.  We will manage  toxicities that arise.  He is a candidate for bone density examination.  He is not a candidate for disability from an oncology standpoint at this time.  All questions were answered. The patient knows to call the clinic with any problems, questions or concerns. We can certainly see the patient much sooner if necessary.  Patient and plan discussed with Dr. Farrel Gobble and he is in agreement with the aforementioned.   KEFALAS,THOMAS

## 2014-02-12 NOTE — Progress Notes (Unsigned)
Walter Boone presents today for injection per MD orders. [name] 30mg  administered IM in right gluteal. Administration without incident. Patient tolerated well.

## 2014-02-12 NOTE — Patient Instructions (Signed)
[facility] Discharge Instructions  RECOMMENDATIONS MADE BY THE CONSULTANT AND ANY TEST RESULTS WILL BE SENT TO YOUR REFERRING PHYSICIAN.  Return in 4 weeks to have PSA drawn. Bone density scan scheduled for you on 02/24/14 at 10:00 am.  Please arrived at 9:45 am at [place] for this test. Next appointment with doctor is in 3 month with blood work.    Thank you for choosing [facility] to provide your oncology and hematology care.  To afford each patient quality time with our providers, please arrive at least 15 minutes before your scheduled appointment time.  With your help, our goal is to use those 15 minutes to complete the necessary work-up to ensure our physicians have the information they need to help with your evaluation and healthcare recommendations.    Effective January 1st, 2014, we ask that you re-schedule your appointment with our physicians should you arrive 10 or more minutes late for your appointment.  We strive to give you quality time with our providers, and arriving late affects you and other patients whose appointments are after yours.    Again, thank you for choosing [facility].  Our hope is that these requests will decrease the amount of time that you wait before being seen by our physicians.       _____________________________________________________________  Should you have questions after your visit to [facility], please contact our office at [phone] between the hours of 8:30 a.m. and 5:00 p.m.  Voicemails left after 4:30 p.m. will not be returned until the following business day.  For prescription refill requests, have your pharmacy contact our office with your prescription refill request.

## 2014-02-24 ENCOUNTER — Ambulatory Visit (HOSPITAL_COMMUNITY)
Admission: RE | Admit: 2014-02-24 | Discharge: 2014-02-24 | Disposition: A | Discharge status: Home / Self Care | Payer: PRIVATE HEALTH INSURANCE | Source: Ambulatory Visit | Attending: Oncology | Admitting: Oncology

## 2014-02-24 DIAGNOSIS — Z13820 Encounter for screening for osteoporosis: Secondary | ICD-10-CM | POA: Insufficient documentation

## 2014-02-24 DIAGNOSIS — C61 Malignant neoplasm of prostate: Secondary | ICD-10-CM

## 2014-02-26 ENCOUNTER — Telehealth (HOSPITAL_COMMUNITY): Payer: Self-pay

## 2014-02-26 NOTE — Telephone Encounter (Signed)
Message left for patient to contact clinic to discuss.

## 2014-02-26 NOTE — Telephone Encounter (Signed)
Message copied by Mellissa Kohut on Wed Feb 26, 2014  4:46 PM ------      Message from: [facility]      Created: Mon Feb 24, 2014 11:46 AM       Recommend Ca++ 1200 mg and Vit D 800.  If he would like, I can write him and Rx for [place]-D ------

## 2014-02-28 ENCOUNTER — Encounter (HOSPITAL_COMMUNITY): Payer: Self-pay

## 2014-03-10 ENCOUNTER — Other Ambulatory Visit (HOSPITAL_COMMUNITY): Payer: PRIVATE HEALTH INSURANCE

## 2014-03-12 ENCOUNTER — Emergency Department (HOSPITAL_COMMUNITY): Payer: PRIVATE HEALTH INSURANCE

## 2014-03-12 ENCOUNTER — Other Ambulatory Visit (HOSPITAL_COMMUNITY): Payer: PRIVATE HEALTH INSURANCE

## 2014-03-12 ENCOUNTER — Emergency Department (HOSPITAL_COMMUNITY)
Admission: EM | Admit: 2014-03-12 | Discharge: 2014-03-12 | Disposition: A | Discharge status: Home / Self Care | Payer: PRIVATE HEALTH INSURANCE | Attending: Emergency Medicine | Admitting: Emergency Medicine

## 2014-03-12 ENCOUNTER — Other Ambulatory Visit: Payer: Self-pay

## 2014-03-12 ENCOUNTER — Encounter (HOSPITAL_COMMUNITY): Payer: Self-pay | Admitting: Emergency Medicine

## 2014-03-12 DIAGNOSIS — R5381 Other malaise: Secondary | ICD-10-CM | POA: Insufficient documentation

## 2014-03-12 DIAGNOSIS — R519 Headache, unspecified: Secondary | ICD-10-CM

## 2014-03-12 DIAGNOSIS — I10 Essential (primary) hypertension: Secondary | ICD-10-CM | POA: Insufficient documentation

## 2014-03-12 DIAGNOSIS — Z8546 Personal history of malignant neoplasm of prostate: Secondary | ICD-10-CM | POA: Insufficient documentation

## 2014-03-12 DIAGNOSIS — Z862 Personal history of diseases of the blood and blood-forming organs and certain disorders involving the immune mechanism: Secondary | ICD-10-CM | POA: Insufficient documentation

## 2014-03-12 DIAGNOSIS — Z87891 Personal history of nicotine dependence: Secondary | ICD-10-CM | POA: Insufficient documentation

## 2014-03-12 DIAGNOSIS — Z8639 Personal history of other endocrine, nutritional and metabolic disease: Secondary | ICD-10-CM | POA: Insufficient documentation

## 2014-03-12 DIAGNOSIS — Z79899 Other long term (current) drug therapy: Secondary | ICD-10-CM | POA: Insufficient documentation

## 2014-03-12 DIAGNOSIS — R110 Nausea: Secondary | ICD-10-CM | POA: Insufficient documentation

## 2014-03-12 DIAGNOSIS — R51 Headache: Secondary | ICD-10-CM | POA: Insufficient documentation

## 2014-03-12 DIAGNOSIS — R5383 Other fatigue: Secondary | ICD-10-CM

## 2014-03-12 DIAGNOSIS — R079 Chest pain, unspecified: Secondary | ICD-10-CM | POA: Insufficient documentation

## 2014-03-12 DIAGNOSIS — R42 Dizziness and giddiness: Secondary | ICD-10-CM | POA: Insufficient documentation

## 2014-03-12 DIAGNOSIS — K219 Gastro-esophageal reflux disease without esophagitis: Secondary | ICD-10-CM | POA: Insufficient documentation

## 2014-03-12 LAB — RAPID URINE DRUG SCREEN, HOSP PERFORMED
AMPHETAMINES: NOT DETECTED
Barbiturates: NOT DETECTED
Benzodiazepines: NOT DETECTED
Cocaine: NOT DETECTED
Opiates: NOT DETECTED
Tetrahydrocannabinol: NOT DETECTED

## 2014-03-12 LAB — COMPREHENSIVE METABOLIC PANEL
ALT: 17 U/L (ref 0–53)
AST: 21 U/L (ref 0–37)
Albumin: 4.1 g/dL (ref 3.5–5.2)
Alkaline Phosphatase: 66 U/L (ref 39–117)
BILIRUBIN TOTAL: 0.3 mg/dL (ref 0.3–1.2)
BUN: 18 mg/dL (ref 6–23)
CHLORIDE: 98 meq/L (ref 96–112)
CO2: 26 mEq/L (ref 19–32)
Calcium: 10.9 mg/dL — ABNORMAL HIGH (ref 8.4–10.5)
Creatinine, Ser: 0.9 mg/dL (ref 0.50–1.35)
GFR calc non Af Amer: >90 mL/min (ref >90)
GLUCOSE: 126 mg/dL — AB (ref 70–99)
POTASSIUM: 3.7 meq/L (ref 3.7–5.3)
Sodium: 138 mEq/L (ref 137–147)
TOTAL PROTEIN: 8 g/dL (ref 6.0–8.3)

## 2014-03-12 LAB — DIFFERENTIAL
Basophils Absolute: 0 10*3/uL (ref 0.0–0.1)
Basophils Relative: 1 % (ref 0–1)
EOS ABS: 0.4 10*3/uL (ref 0.0–0.7)
Eosinophils Relative: 5 % (ref 0–5)
LYMPHS ABS: 1.9 10*3/uL (ref 0.7–4.0)
Lymphocytes Relative: 25 % (ref 12–46)
Monocytes Absolute: 0.6 10*3/uL (ref 0.1–1.0)
Monocytes Relative: 8 % (ref 3–12)
NEUTROS ABS: 4.5 10*3/uL (ref 1.7–7.7)
NEUTROS PCT: 62 % (ref 43–77)

## 2014-03-12 LAB — URINALYSIS, ROUTINE W REFLEX MICROSCOPIC
Bilirubin Urine: NEGATIVE
Glucose, UA: NEGATIVE mg/dL
Hgb urine dipstick: NEGATIVE
Ketones, ur: NEGATIVE mg/dL
Leukocytes, UA: NEGATIVE
NITRITE: NEGATIVE
PROTEIN: NEGATIVE mg/dL
Specific Gravity, Urine: 1.01 (ref 1.005–1.030)
UROBILINOGEN UA: 0.2 mg/dL (ref 0.0–1.0)
pH: 5.5 (ref 5.0–8.0)

## 2014-03-12 LAB — CBC
HCT: 41.6 % (ref 39.0–52.0)
HEMOGLOBIN: 14.3 g/dL (ref 13.0–17.0)
MCH: 31.1 pg (ref 26.0–34.0)
MCHC: 34.4 g/dL (ref 30.0–36.0)
MCV: 90.4 fL (ref 78.0–100.0)
PLATELETS: 338 10*3/uL (ref 150–400)
RBC: 4.6 MIL/uL (ref 4.22–5.81)
RDW: 12.4 % (ref 11.5–15.5)
WBC: 7.4 10*3/uL (ref 4.0–10.5)

## 2014-03-12 LAB — APTT: APTT: 28 s (ref 24–37)

## 2014-03-12 LAB — I-STAT CHEM 8, ED
BUN: 18 mg/dL (ref 6–23)
CHLORIDE: 101 meq/L (ref 96–112)
CREATININE: 0.8 mg/dL (ref 0.50–1.35)
Calcium, Ion: 1.34 mmol/L — ABNORMAL HIGH (ref 1.13–1.30)
Glucose, Bld: 116 mg/dL — ABNORMAL HIGH (ref 70–99)
HCT: 45 % (ref 39.0–52.0)
Hemoglobin: 15.3 g/dL (ref 13.0–17.0)
POTASSIUM: 3.6 meq/L — AB (ref 3.7–5.3)
SODIUM: 139 meq/L (ref 137–147)
TCO2: 24 mmol/L (ref 0–100)

## 2014-03-12 LAB — PROTIME-INR
INR: 0.92 (ref 0.00–1.49)
Prothrombin Time: 12.2 seconds (ref 11.6–15.2)

## 2014-03-12 LAB — I-STAT TROPONIN, ED: TROPONIN I, POC: 0 ng/mL (ref 0.00–0.08)

## 2014-03-12 LAB — TROPONIN I

## 2014-03-12 LAB — ETHANOL: Alcohol, Ethyl (B): <11 mg/dL (ref 0–11)

## 2014-03-12 MED ORDER — DIPHENHYDRAMINE HCL 50 MG/ML IJ SOLN
25.0000 mg | Freq: Once | INTRAMUSCULAR | Status: AC
  Administered 2014-03-12: 25 mg via INTRAVENOUS
  Filled 2014-03-12: qty 1

## 2014-03-12 MED ORDER — METOCLOPRAMIDE HCL 5 MG/ML IJ SOLN
10.0000 mg | Freq: Once | INTRAMUSCULAR | Status: AC
  Administered 2014-03-12: 10 mg via INTRAVENOUS
  Filled 2014-03-12: qty 2

## 2014-03-12 NOTE — ED Provider Notes (Signed)
Patient seen/examined in the Emergency Department in conjunction with [facility] Patient reports dizziness Exam : awake/alert, well appearing, no arm/leg drift.  No facial droop. No past pointing.  No ataxia Plan: pt is well appearing.  He tells me he has had this HA before.  He said he first thought he was having "heart attack" but reports only brief episode of CP, then had nausea and developed HA My suspicion for acute PE/ACS/dissection is low at this time   Date: 03/12/2014  Rate: 77  Rhythm: normal sinus rhythm  QRS Axis: normal  Intervals: normal  ST/T Wave abnormalities: nonspecific ST changes  Conduction Disutrbances:none  Narrative Interpretation:   Old EKG Reviewed: unchanged    Sharyon Cable, MD 03/12/14 1104

## 2014-03-12 NOTE — ED Provider Notes (Signed)
CSN: [id number]     Arrival date & time 03/12/14  6213 History   First MD Initiated Contact with Patient 03/12/14 [id number]     Chief Complaint  Patient presents with  . Dizziness     (Consider location/radiation/quality/duration/timing/severity/associated sxs/prior Treatment) HPI Comments: Patient is a 63 year old male who presents to the emergency department by EMS with complaint of weakness and dizziness. The patient states that he was in his usual state of good health until approximately 9 AM this morning when he was at work and began to not feel well. He got up to go to the bathroom and at that point noticed that he was quite weak. He became nauseated and complained of a headache of the back of his head. He noticed that he had" dry mouth" and felt that he would pass out. He did not pass out but states he just didn't feel well. He denies any neck pain jaw pain or shoulder pain. He states that he had to" stab pains" to his chest but these came and went away. It is of note that the patient states that in the last several weeks he has been having pain in his chest almost daily. He attributes it to a reflux problem because he has to take TUMS antacids at bedtime. He was seen by his primary care physician on yesterday, but forgot to mention this to his primary doctor (Dr. Luan Pulling). The patient denies any unusual sweats during this episode. It is of note that the patient has history of prostate cancer. He is on [name] shots every 3 months. He states however he has not been having side effects from these up to this point. His been no recent infections reported. No recent surgeries or procedures are reported. Patient presents now for additional evaluation and management of the symptoms.  Patient is a 63 y.o. male presenting with dizziness. The history is provided by the patient.  Dizziness Associated symptoms: chest pain, headaches and nausea   Associated symptoms: no blood in stool, no palpitations and no  shortness of breath     Past Medical History  Diagnosis Date  . Prostate cancer   . Fatty liver   . Hypertension   . Hyperlipidemia   . Hemorrhoids     history  . Prostate cancer 06/20/2011  . Prostate carcinoma, recurrent 08/10/2012  . Gynecomastia, male 01/11/2013    Secondary to prostate ca Tx.    Past Surgical History  Procedure Laterality Date  . Prostate biopsy  11/06  . Esophagogastroduodenoscopy    . Knee surgery      left  . Ankle surgery  1987    left  . Hernia repair  2010  . Colonoscopy  2008   Family History  Problem Relation Age of Onset  . Diabetes Father   . Cancer Sister   . Diabetes Brother    History  Substance Use Topics  . Smoking status: Former Smoker -- 2.50 packs/day for 3 years  . Smokeless tobacco: Never Used  . Alcohol Use: No     Comment: former drinker 20 years ago    Review of Systems  Constitutional: Positive for fatigue. Negative for activity change.       All ROS Neg except as noted in HPI  HENT: Negative for nosebleeds.   Eyes: Negative for photophobia and discharge.  Respiratory: Negative for cough, shortness of breath and wheezing.   Cardiovascular: Positive for chest pain. Negative for palpitations.  Gastrointestinal: Positive for nausea. Negative for abdominal  pain and blood in stool.  Genitourinary: Negative for dysuria, frequency and hematuria.  Musculoskeletal: Positive for back pain. Negative for arthralgias and neck pain.  Skin: Negative.   Neurological: Positive for dizziness, weakness and headaches. Negative for seizures and speech difficulty.  Psychiatric/Behavioral: Negative for hallucinations and confusion.      Allergies  Review of patient's allergies indicates no known allergies.  Home Medications   Current Outpatient Rx  Name  Route  Sig  Dispense  Refill  . bicalutamide (CASODEX) 50 MG tablet   Oral   Take 1 tablet (50 mg total) by mouth daily.   30 tablet   5   . cyclobenzaprine (FLEXERIL) 5 MG  tablet   Oral   Take 1 tablet (5 mg total) by mouth 3 (three) times daily as needed for muscle spasms.   30 tablet   0   . gabapentin (NEURONTIN) 300 MG capsule   Oral   Take 300 mg by mouth at bedtime.         . hydrochlorothiazide (HYDRODIURIL) 25 MG tablet   Oral   Take 25 mg by mouth daily.         Marland Kitchen losartan (COZAAR) 100 MG tablet   Oral   Take 100 mg by mouth daily.         . Melatonin 5 MG TABS   Oral   Take 2 tablets by mouth at bedtime.         . naproxen (NAPROSYN) 500 MG tablet   Oral   Take 1 tablet (500 mg total) by mouth 2 (two) times daily.   30 tablet   0   . sertraline (ZOLOFT) 25 MG tablet   Oral   Take 1 tablet (25 mg total) by mouth daily.   30 tablet   1   . traMADol (ULTRAM) 50 MG tablet   Oral   Take 2 tablets (100 mg total) by mouth every 6 (six) hours as needed.   16 tablet   0    BP 162/99  Pulse 78  Temp(Src) 98 F (36.7 C) (Oral)  Resp 23  Ht 5\' 6"  (1.676 m)  Wt 230 lb (104.327 kg)  BMI 37.14 kg/m2  SpO2 96% Physical Exam  Nursing note and vitals reviewed. Constitutional: He is oriented to person, place, and time. He appears well-developed and well-nourished.  Non-toxic appearance.  HENT:  Head: Normocephalic.  Right Ear: Tympanic membrane and external ear normal.  Left Ear: Tympanic membrane and external ear normal.  No facial asymmetry. Gag reflex intact.  Eyes: EOM and lids are normal. Pupils are equal, round, and reactive to light.  Neck: Normal range of motion. Neck supple. Carotid bruit is not present.  Cardiovascular: Normal rate, regular rhythm, normal heart sounds, intact distal pulses and normal pulses.  Exam reveals no friction rub.   No murmur heard. Pulmonary/Chest: Breath sounds normal. No respiratory distress. He has no wheezes. He has no rales.  Abdominal: Soft. Bowel sounds are normal. There is no tenderness. There is no guarding.  Musculoskeletal: Normal range of motion.  Lymphadenopathy:        Head (right side): No submandibular adenopathy present.       Head (left side): No submandibular adenopathy present.    He has no cervical adenopathy.  Neurological: He is alert and oriented to person, place, and time. He has normal strength. No cranial nerve deficit or sensory deficit. He exhibits normal muscle tone. Coordination normal.  Speech is clear. No problems  with coordination.  Skin: Skin is warm and dry. No rash noted.  Psychiatric: He has a normal mood and affect. His speech is normal.    ED Course  Procedures (including critical care time) Labs Review Labs Reviewed  ETHANOL  PROTIME-INR  APTT  CBC  DIFFERENTIAL  COMPREHENSIVE METABOLIC PANEL  URINE RAPID DRUG SCREEN (HOSP PERFORMED)  URINALYSIS, ROUTINE W REFLEX MICROSCOPIC  I-STAT CHEM 8, ED  I-STAT TROPOININ, ED   Imaging Review No results found.   EKG Interpretation None      MDM Patient has an episode early this morning with dizziness, headache, and generally not feeling well. The patient was brought to the emergency department by EMS. Additional evaluation showed no evidence for acute stroke. Patient seen with me by Dr. Christy Gentles CT head scan was read as negative for acute event problem. Compress metabolic panel was nonacute. Complete blood count is well within normal limits. Ethanol level less than 11. Troponin was 0. I-STAT 8 revealed the potassium to be slightly low at 3.6, otherwise nonacute. Urinalysis was normal. Urine drug screen is negative. A repeat troponin was also obtained and found to be normal at less than 0.30.  The patient had a problem with headache and this was resolved with IV Benadryl and Reglan. The patient was ambulated in the room and [place] without problem.  Patient also reassessed by Dr. Christy Gentles. Feel that it is safe for the patient to be discharged home at this time. Patient advised to increase fluids. To see his cancer physicians for review of medications in the event that this is a  medication side effect.    Final diagnoses:  None    **I have reviewed nursing notes, vital signs, and all appropriate lab and imaging results for this patient.Lenox Ahr, PA-C 03/12/14 1424

## 2014-03-12 NOTE — ED Notes (Signed)
Pt experienced onset of dizziness, and headache in back of head at 0840 today. Pt states he is on medications treatment for prostate cancer, pt not currently taking chemo or radiation.Marland Kitchen

## 2014-03-12 NOTE — ED Notes (Signed)
Pt alert & oriented x4, stable gait. Patient given discharge instructions, paperwork & prescription(s). Patient  instructed to stop at the registration desk to finish any additional paperwork. Patient verbalized understanding. Pt left department w/ no further questions. 

## 2014-03-12 NOTE — ED Notes (Signed)
Discharge instructions reviewed with pt, questions answered. Pt verbalized understanding.  

## 2014-03-12 NOTE — ED Provider Notes (Signed)
Medical screening examination/treatment/procedure(s) were conducted as a shared visit with non-physician practitioner(s) and myself.  I personally evaluated the patient during the encounter.      Sharyon Cable, MD 03/12/14 [phone]

## 2014-03-12 NOTE — Discharge Instructions (Signed)
Your caregiver has diagnosed you as having chest pain that is not specific for one problem, but does not require admission.  Chest pain comes from many different causes.  °SEEK IMMEDIATE MEDICAL ATTENTION IF: °You have severe chest pain, especially if the pain is crushing or pressure-like and spreads to the arms, back, neck, or jaw, or if you have sweating, nausea (feeling sick to your stomach), or shortness of breath. THIS IS AN EMERGENCY. Don't wait to see if the pain will go away. Get medical help at once. Call 911 or 0 (operator). DO NOT drive yourself to the hospital.  °Your chest pain gets worse and does not go away with rest.  °You have an attack of chest pain lasting longer than usual, despite rest and treatment with the medications your caregiver has prescribed.  °You wake from sleep with chest pain or shortness of breath.  °You feel dizzy or faint.  °You have chest pain not typical of your usual pain for which you originally saw your caregiver. ° °You are having a headache. No specific cause was found today for your headache. It may have been a migraine or other cause of headache. Stress, anxiety, fatigue, and depression are common triggers for headaches. Your headache today does not appear to be life-threatening or require hospitalization, but often the exact cause of headaches is not determined in the emergency department. Therefore, follow-up with your doctor is very important to find out what may have caused your headache, and whether or not you need any further diagnostic testing or treatment. Sometimes headaches can appear benign (not harmful), but then more serious symptoms can develop which should prompt an immediate re-evaluation by your doctor or the emergency department. ° °SEEK MEDICAL ATTENTION IF: ° °You develop possible problems with medications prescribed.  °The medications don't resolve your headache, if it recurs , or if you have multiple episodes of vomiting or can't take fluids. °You  have a change from the usual headache. ° °RETURN IMMEDIATELY IF you develop a sudden, severe headache or confusion, become poorly responsive or faint, develop a fever above 100.4F or problem breathing, have a change in speech, vision, swallowing, or understanding, or develop new weakness, numbness, tingling, incoordination, or have a seizure. ° °

## 2014-04-02 ENCOUNTER — Observation Stay (HOSPITAL_COMMUNITY)
Admission: EM | Admit: 2014-04-02 | Discharge: 2014-04-04 | Disposition: A | Discharge status: Home / Self Care | Payer: PRIVATE HEALTH INSURANCE | Attending: Pulmonary Disease | Admitting: Pulmonary Disease

## 2014-04-02 ENCOUNTER — Emergency Department (HOSPITAL_COMMUNITY): Payer: PRIVATE HEALTH INSURANCE

## 2014-04-02 ENCOUNTER — Encounter (HOSPITAL_COMMUNITY): Payer: Self-pay | Admitting: Emergency Medicine

## 2014-04-02 DIAGNOSIS — R110 Nausea: Secondary | ICD-10-CM | POA: Insufficient documentation

## 2014-04-02 DIAGNOSIS — R51 Headache: Secondary | ICD-10-CM | POA: Insufficient documentation

## 2014-04-02 DIAGNOSIS — I10 Essential (primary) hypertension: Secondary | ICD-10-CM | POA: Insufficient documentation

## 2014-04-02 DIAGNOSIS — E7800 Pure hypercholesterolemia, unspecified: Secondary | ICD-10-CM | POA: Insufficient documentation

## 2014-04-02 DIAGNOSIS — E785 Hyperlipidemia, unspecified: Secondary | ICD-10-CM | POA: Insufficient documentation

## 2014-04-02 DIAGNOSIS — R0602 Shortness of breath: Secondary | ICD-10-CM | POA: Insufficient documentation

## 2014-04-02 DIAGNOSIS — Z9221 Personal history of antineoplastic chemotherapy: Secondary | ICD-10-CM | POA: Insufficient documentation

## 2014-04-02 DIAGNOSIS — Z8249 Family history of ischemic heart disease and other diseases of the circulatory system: Secondary | ICD-10-CM | POA: Insufficient documentation

## 2014-04-02 DIAGNOSIS — M6281 Muscle weakness (generalized): Secondary | ICD-10-CM | POA: Insufficient documentation

## 2014-04-02 DIAGNOSIS — E876 Hypokalemia: Secondary | ICD-10-CM | POA: Diagnosis present

## 2014-04-02 DIAGNOSIS — R61 Generalized hyperhidrosis: Secondary | ICD-10-CM | POA: Insufficient documentation

## 2014-04-02 DIAGNOSIS — Z923 Personal history of irradiation: Secondary | ICD-10-CM | POA: Insufficient documentation

## 2014-04-02 DIAGNOSIS — K7689 Other specified diseases of liver: Secondary | ICD-10-CM | POA: Insufficient documentation

## 2014-04-02 DIAGNOSIS — R55 Syncope and collapse: Secondary | ICD-10-CM | POA: Insufficient documentation

## 2014-04-02 DIAGNOSIS — C61 Malignant neoplasm of prostate: Secondary | ICD-10-CM | POA: Diagnosis present

## 2014-04-02 DIAGNOSIS — Z87891 Personal history of nicotine dependence: Secondary | ICD-10-CM | POA: Insufficient documentation

## 2014-04-02 DIAGNOSIS — R079 Chest pain, unspecified: Principal | ICD-10-CM | POA: Insufficient documentation

## 2014-04-02 LAB — CBC
HCT: 39.6 % (ref 39.0–52.0)
Hemoglobin: 13.7 g/dL (ref 13.0–17.0)
MCH: 31.3 pg (ref 26.0–34.0)
MCHC: 34.6 g/dL (ref 30.0–36.0)
MCV: 90.4 fL (ref 78.0–100.0)
PLATELETS: 403 10*3/uL — AB (ref 150–400)
RBC: 4.38 MIL/uL (ref 4.22–5.81)
RDW: 12.3 % (ref 11.5–15.5)
WBC: 9 10*3/uL (ref 4.0–10.5)

## 2014-04-02 LAB — LIPID PANEL
Cholesterol: 236 mg/dL — ABNORMAL HIGH (ref 0–200)
HDL: 46 mg/dL (ref >39)
LDL CALC: 144 mg/dL — AB (ref 0–99)
Total CHOL/HDL Ratio: 5.1 RATIO
Triglycerides: 232 mg/dL — ABNORMAL HIGH (ref <150)
VLDL: 46 mg/dL — ABNORMAL HIGH (ref 0–40)

## 2014-04-02 LAB — COMPREHENSIVE METABOLIC PANEL
ALBUMIN: 3.9 g/dL (ref 3.5–5.2)
ALK PHOS: 61 U/L (ref 39–117)
ALT: 21 U/L (ref 0–53)
AST: 20 U/L (ref 0–37)
BILIRUBIN TOTAL: 0.3 mg/dL (ref 0.3–1.2)
BUN: 22 mg/dL (ref 6–23)
CHLORIDE: 100 meq/L (ref 96–112)
CO2: 29 meq/L (ref 19–32)
Calcium: 9.8 mg/dL (ref 8.4–10.5)
Creatinine, Ser: 0.89 mg/dL (ref 0.50–1.35)
GFR calc Af Amer: >90 mL/min (ref >90)
Glucose, Bld: 101 mg/dL — ABNORMAL HIGH (ref 70–99)
POTASSIUM: 3.6 meq/L — AB (ref 3.7–5.3)
SODIUM: 140 meq/L (ref 137–147)
Total Protein: 7 g/dL (ref 6.0–8.3)

## 2014-04-02 LAB — TROPONIN I: Troponin I: <0.3 ng/mL (ref <0.30)

## 2014-04-02 LAB — D-DIMER, QUANTITATIVE (NOT AT ARMC): D-Dimer, Quant: 0.39 ug/mL-FEU (ref 0.00–0.48)

## 2014-04-02 LAB — PROTIME-INR
INR: 0.95 (ref 0.00–1.49)
Prothrombin Time: 12.5 seconds (ref 11.6–15.2)

## 2014-04-02 MED ORDER — ONDANSETRON HCL 4 MG PO TABS: 4.0000 mg | ORAL_TABLET | Freq: Four times a day (QID) | ORAL | Status: DC | PRN

## 2014-04-02 MED ORDER — POTASSIUM CHLORIDE CRYS ER 20 MEQ PO TBCR
40.0000 meq | EXTENDED_RELEASE_TABLET | Freq: Once | ORAL | Status: AC
Start: 2014-04-02 — End: 2014-04-02
  Administered 2014-04-02: 40 meq via ORAL
  Filled 2014-04-02: qty 2

## 2014-04-02 MED ORDER — ACETAMINOPHEN 325 MG PO TABS: 650.0000 mg | ORAL_TABLET | Freq: Four times a day (QID) | ORAL | Status: DC | PRN

## 2014-04-02 MED ORDER — SODIUM CHLORIDE 0.9 % IV SOLN
1000.0000 mL | INTRAVENOUS | Status: DC
  Administered 2014-04-02: 1000 mL via INTRAVENOUS

## 2014-04-02 MED ORDER — ASPIRIN 81 MG PO CHEW
324.0000 mg | CHEWABLE_TABLET | Freq: Once | ORAL | Status: AC
  Administered 2014-04-02: 324 mg via ORAL
  Filled 2014-04-02: qty 4

## 2014-04-02 MED ORDER — SODIUM CHLORIDE 0.9 % IV SOLN
INTRAVENOUS | Status: DC
  Administered 2014-04-02: 17:00:00 via INTRAVENOUS

## 2014-04-02 MED ORDER — SODIUM CHLORIDE 0.9 % IJ SOLN: 3.0000 mL | Freq: Two times a day (BID) | INTRAMUSCULAR | Status: DC

## 2014-04-02 MED ORDER — ACETAMINOPHEN 650 MG RE SUPP: 650.0000 mg | Freq: Four times a day (QID) | RECTAL | Status: DC | PRN

## 2014-04-02 MED ORDER — NITROGLYCERIN 0.4 MG SL SUBL: 0.4000 mg | SUBLINGUAL_TABLET | SUBLINGUAL | Status: DC | PRN

## 2014-04-02 MED ORDER — ALUM & MAG HYDROXIDE-SIMETH 200-200-20 MG/5ML PO SUSP: 30.0000 mL | Freq: Four times a day (QID) | ORAL | Status: DC | PRN

## 2014-04-02 MED ORDER — MORPHINE SULFATE 2 MG/ML IJ SOLN
1.0000 mg | INTRAMUSCULAR | Status: DC | PRN
  Administered 2014-04-03: 1 mg via INTRAVENOUS
  Filled 2014-04-02: qty 1

## 2014-04-02 MED ORDER — HYDRALAZINE HCL 20 MG/ML IJ SOLN: 5.0000 mg | Freq: Four times a day (QID) | INTRAMUSCULAR | Status: DC | PRN

## 2014-04-02 MED ORDER — HYDROCODONE-ACETAMINOPHEN 5-325 MG PO TABS
1.0000 | ORAL_TABLET | ORAL | Status: DC | PRN
  Administered 2014-04-03: 2 via ORAL
  Filled 2014-04-02: qty 2

## 2014-04-02 MED ORDER — BICALUTAMIDE 50 MG PO TABS
50.0000 mg | ORAL_TABLET | Freq: Every day | ORAL | Status: DC
  Administered 2014-04-03 – 2014-04-04 (×2): 50 mg via ORAL
  Filled 2014-04-02 (×5): qty 1

## 2014-04-02 MED ORDER — ONDANSETRON HCL 4 MG/2ML IJ SOLN: 4.0000 mg | Freq: Four times a day (QID) | INTRAMUSCULAR | Status: DC | PRN

## 2014-04-02 MED ORDER — MAGNESIUM CITRATE PO SOLN: 1.0000 | Freq: Once | ORAL | Status: AC | PRN

## 2014-04-02 MED ORDER — BISACODYL 10 MG RE SUPP: 10.0000 mg | Freq: Every day | RECTAL | Status: DC | PRN

## 2014-04-02 MED ORDER — ENOXAPARIN SODIUM 40 MG/0.4ML ~~LOC~~ SOLN
40.0000 mg | SUBCUTANEOUS | Status: DC
  Filled 2014-04-02 (×2): qty 0.4

## 2014-04-02 MED ORDER — SENNOSIDES-DOCUSATE SODIUM 8.6-50 MG PO TABS: 1.0000 | ORAL_TABLET | Freq: Every evening | ORAL | Status: DC | PRN

## 2014-04-02 NOTE — ED Provider Notes (Signed)
CSN: [id number]     Arrival date & time 04/02/14  1119 History   First MD Initiated Contact with Patient 04/02/14 1129     Chief Complaint  Patient presents with  . Hypertension     (Consider location/radiation/quality/duration/timing/severity/associated sxs/prior Treatment) HPI Patient presents after one episode of near-syncope. Patient notes that he was at rest when he suddenly developed lightheadedness, diaphoresis, nausea, generalized weakness. Symptoms resolved over some time, but on evaluation the patient continues to have nausea. Patient denies pain throughout the experience. There is no crypt precipitant, and no clear alleviating or exacerbating factors. Patient has history of hypertension, prostate cancer, currently receiving oral therapy, following radiation therapy in the distant past. Patient had one similar episode a tightness, approximately 3 weeks ago.  He is a scheduled for cardiology evaluation in 3 weeks.  Past Medical History  Diagnosis Date  . Prostate cancer   . Fatty liver   . Hypertension   . Hyperlipidemia   . Hemorrhoids     history  . Prostate cancer 06/20/2011  . Prostate carcinoma, recurrent 08/10/2012  . Gynecomastia, male 01/11/2013    Secondary to prostate ca Tx.    Past Surgical History  Procedure Laterality Date  . Prostate biopsy  11/06  . Esophagogastroduodenoscopy    . Knee surgery      left  . Ankle surgery  1987    left  . Hernia repair  2010  . Colonoscopy  2008   Family History  Problem Relation Age of Onset  . Diabetes Father   . Cancer Sister   . Diabetes Brother    History  Substance Use Topics  . Smoking status: Former Smoker -- 2.50 packs/day for 3 years  . Smokeless tobacco: Never Used  . Alcohol Use: No     Comment: former drinker 20 years ago    Review of Systems  Constitutional:       Per HPI, otherwise negative  HENT:       Per HPI, otherwise negative  Respiratory:       Per HPI, otherwise negative   Cardiovascular:       Per HPI, otherwise negative  Gastrointestinal: Negative for vomiting.  Endocrine:       Negative aside from HPI  Genitourinary:       Neg aside from HPI   Musculoskeletal:       Per HPI, otherwise negative  Skin: Negative.   Neurological: Positive for light-headedness. Negative for syncope.      Allergies  Review of patient's allergies indicates no known allergies.  Home Medications   Prior to Admission medications   Medication Sig Start Date End Date Taking? Authorizing Provider  bicalutamide (CASODEX) 50 MG tablet Take 1 tablet (50 mg total) by mouth daily. 02/03/14   Baird Cancer, PA-C  cyclobenzaprine (FLEXERIL) 5 MG tablet Take 1 tablet (5 mg total) by mouth 3 (three) times daily as needed for muscle spasms. 11/21/13   Janice Norrie, MD  hydrochlorothiazide (HYDRODIURIL) 25 MG tablet Take 25 mg by mouth daily.    [name], MD  lidocaine (LIDODERM) 5 % Place 1 patch onto the skin daily. Remove & Discard patch within 12 hours or as directed by MD    Historical Provider, MD  losartan (COZAAR) 100 MG tablet Take 100 mg by mouth daily.    [name], MD  Melatonin 5 MG TABS Take 2 tablets by mouth at bedtime.    Historical Provider, MD   BP  129/85  Pulse 73  Temp(Src) 98 F (36.7 C) (Oral)  Resp 19  SpO2 95% Physical Exam  Nursing note and vitals reviewed. Constitutional: He is oriented to person, place, and time. He appears well-developed. No distress.  HENT:  Head: Normocephalic and atraumatic.  Eyes: Conjunctivae and EOM are normal.  Cardiovascular: Normal rate and regular rhythm.   Pulmonary/Chest: Effort normal. No stridor. No respiratory distress.  Abdominal: He exhibits no distension.  Musculoskeletal: He exhibits no edema and no tenderness.  Neurological: He is alert and oriented to person, place, and time. No cranial nerve deficit. He exhibits normal muscle tone. Coordination normal.  Skin: Skin is warm and dry.   Psychiatric: He has a normal mood and affect.    ED Course  Procedures (including critical care time) Labs Review Labs Reviewed  CBC  COMPREHENSIVE METABOLIC PANEL  PROTIME-INR  TROPONIN I    Imaging Review No results found. Pulse oximetry 100% room normal Heart attack monitor shows rate 75, sinus, normal After the initial evaluation I reviewed the patient's chart.   EKG Interpretation   Date/Time:  Wednesday April 02 2014 11:54:34 EDT Ventricular Rate:  74 PR Interval:  164 QRS Duration: 94 QT Interval:  424 QTC Calculation: 470 R Axis:   -2 Text Interpretation:  Normal sinus rhythm Normal ECG When compared with  ECG of 12-Mar-2014 13:34, Nonspecific T wave abnormality no longer evident  in Lateral leads Sinus rhythm Normal ECG Confirmed by Carmin Muskrat  MD  [id number]) on 04/02/2014 12:11:49 PM     On repeat exam the patient appears comfortable. MDM   Final diagnoses:  Near syncope     I personally performed the services described in this documentation, which was scribed in my presence. The recorded information has been reviewed and is accurate.   Patient presents after a near syncope episode.  On exam patient is awake and alert, appropriate interactive, in no distress.  However, with the patient's description of 2 episodes of near-syncope, both with diaphoresis, lightheadedness, discomfort, although he has largely reassuring evaluation today, he was admitted for further evaluation and management.    Carmin Muskrat, MD 04/02/14 [id number]

## 2014-04-02 NOTE — H&P (Signed)
[facility] History and Physical  Walter Boone ERD:[id number] DOB: 01-Apr-1951 DOA: 04/02/2014  Referring physician: Vanita Panda PCP: [name], MD   Chief Complaint: dizziness/chest pain  HPI: Walter Boone is a 63 y.o. male with past medical history that includes hypertension, hyperlipidemia, prostate cancer with remote radiation currently medical therapy, reports to the emergency department with the chief complaint of near syncope and chest pain. Patient reports that while sitting at his desk today he developed sudden dizziness, diaphoresis, nausea without vomiting, generalized weakness with hot flash, mild shortness of breath and headache located on the back of his head. He states he had a similar episode 3 weeks ago and at that time he was found to be hypertensive. His blood pressure was checked at the workplace and reported to be 168/102. He states as he stood up he felt like he was going to "pass out" and he experienced some brief sharp left anterior chest pain. He denies radiation of this pain. He denies any palpitation abdominal pain fever chills recent illness. He denies numbness tingling of his extremities. He denies any orthopnea. No cough. Initial lab work in the emergency department  reveals platelets 403, potassium 3.6 and serum glucose of 101 otherwise unremarkable. Initial troponin negative. Chest x-ray with mild right base atelectatic change. No edema or consolidation. EKG with normal sinus rhythm and nonspecific T-wave abnormality.   Review of Systems:  10 point review of systems completed all systems are negative except as indicated in the history of present illness  Past Medical History  Diagnosis Date  . Prostate cancer   . Fatty liver   . Hypertension   . Hyperlipidemia   . Hemorrhoids     history  . Prostate cancer 06/20/2011  . Prostate carcinoma, recurrent 08/10/2012  . Gynecomastia, male 01/11/2013    Secondary to prostate ca Tx.    Past  Surgical History  Procedure Laterality Date  . Prostate biopsy  11/06  . Esophagogastroduodenoscopy    . Knee surgery      left  . Ankle surgery  1987    left  . Hernia repair  2010  . Colonoscopy  2008   Social History:  reports that he has quit smoking. He has never used smokeless tobacco. He reports that he does not drink alcohol or use illicit drugs. He lives with his family. He is employed at [employer], he is independent with ADL's No Known Allergies  Family History  Problem Relation Age of Onset  . Diabetes Father   . Cancer Sister   . Diabetes Brother    with his parents are deceased they died in their 66s. Mother with a history of CAD. He has 12 brothers and sisters to collective medical history is positive for CAD, colon cancer ,diabetes  Prior to Admission medications   Medication Sig Start Date End Date Taking? Authorizing Provider  bicalutamide (CASODEX) 50 MG tablet Take 1 tablet (50 mg total) by mouth daily. 02/03/14  Yes Baird Cancer, PA-C  CALCIUM PO Take 1 tablet by mouth 2 (two) times daily.   Yes Historical Provider, MD  hydrochlorothiazide (HYDRODIURIL) 25 MG tablet Take 25 mg by mouth daily.   Yes [name], MD  ibuprofen (ADVIL,MOTRIN) 800 MG tablet Take 1,600 mg by mouth daily as needed for headache.   Yes Historical Provider, MD   Physical Exam: Filed Vitals:   04/02/14 1436  BP: 142/78  Pulse: 69  Temp:   Resp:     BP 142/78  Pulse 69  Temp(Src) 98 F (36.7 C) (Oral)  Resp 16  SpO2 100%  General:  Appears calm and comfortable Eyes: PERRL, normal lids, irises & conjunctiva ENT: grossly normal hearing, lips & tongue Neck: no LAD, masses or thyromegaly Cardiovascular: RRR, no m/r/g. No LE edema. Telemetry: SR, no arrhythmias  Respiratory: CTA bilaterally, no w/r/r. Normal respiratory effort. Abdomen: soft, ntnd +BS Skin: no rash or induration seen on limited exam Musculoskeletal: grossly normal tone BUE/BLE Psychiatric: grossly  normal mood and affect, speech fluent and appropriate Neurologic: grossly non-focal. Speech clear facial symmetry.           Labs on Admission:  Basic Metabolic Panel:  Recent Labs Lab 04/02/14 1228  NA 140  K 3.6*  CL 100  CO2 29  GLUCOSE 101*  BUN 22  CREATININE 0.89  CALCIUM 9.8   Liver Function Tests:  Recent Labs Lab 04/02/14 1228  AST 20  ALT 21  ALKPHOS 61  BILITOT 0.3  PROT 7.0  ALBUMIN 3.9   No results found for this basename: LIPASE, AMYLASE,  in the last 168 hours No results found for this basename: AMMONIA,  in the last 168 hours CBC:  Recent Labs Lab 04/02/14 1228  WBC 9.0  HGB 13.7  HCT 39.6  MCV 90.4  PLT 403*   Cardiac Enzymes:  Recent Labs Lab 04/02/14 1228  TROPONINI <0.30    BNP (last 3 results) No results found for this basename: PROBNP,  in the last 8760 hours CBG: No results found for this basename: GLUCAP,  in the last 168 hours  Radiological Exams on Admission: Dg Chest 2 View  04/02/2014   CLINICAL DATA:  Chest pain  EXAM: CHEST  2 VIEW  COMPARISON:  Apr 22, 2013  FINDINGS: There is mild right base atelectasis. Lungs are otherwise clear. Heart size and pulmonary vascularity are normal. No adenopathy. Aorta is mildly tortuous. No pneumothorax. No bone lesions.  IMPRESSION: Mild right base atelectatic change.  No edema or consolidation.   Electronically Signed   By: Lowella Grip M.D.   On: 04/02/2014 12:48    EKG: Independently reviewed NSR with T wave abnormality  Assessment/Plan Principal Problem:   Chest pain/Near syncope: atypical. Will admit to tele. Risk factors include HTN, hyperlipidemia obesity.  Will cycle CE, check TSH, lipid panel and orthostatic vital signs. Obtain 2decho. Will monitor oxygen saturation level and provide oxygen supplementation as needed. Will provide prn zofran, ntg and morphine as well. Patient had similar episode 3 weeks ago. At that time CT head without abnormality. He reports he followed  up with PCP last week and cardiology consult arranged. Given this is second episode in 3 weeks will request inpatient consult. May benefit from stress test but i suspect this could be done OP if above negative. Active Problems:  HTN: SBP range 129-150. DBP range 78-86. Home meds include HCTZ. Will hold for now. Monitor BP q4hours x3. Provide prn hydralazine as needed. Of note, chart review indicates patient reported hot flashes to oncology team in March making it difficult to sleep. It was presumed this related to depo-lupron. Zoloft was ineffective and he declined Effexor.   Hypokalemia: mild. Likely related to HCTZ. Will replete. Recheck in am    Prostate cancer: on casodex daily and [name] every 3 months. Last injection 02/12/14 Will continue. Chart review indicates recurrent.       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

## 2014-04-02 NOTE — H&P (Signed)
Patient seen and examined. Above note reviewed.  Patient has been admitted with recurrent episodes of chest pain, lightheadedness, diaphoresis and syncope.  Risk factors include obesity, hyperlipidemia, hypertension and previous smoking. Work up in the ER has been unremarkable. EKG is nonacute.  D dimer is negative.  Will monitor on telemetry, cycle cardiac markers and get 2D echo.  He is scheduled to see cardiology in the coming weeks.  Will request inpatient consultation since he has had recurrent symptoms and may benefit from stress test prior to discharge.  [employer]

## 2014-04-02 NOTE — ED Notes (Signed)
Admitting NP at bedside at this time.  

## 2014-04-02 NOTE — ED Notes (Signed)
Pt reports hypertension and headache. Pt states he had his BP checked at work and it was 161/102. Pt states headache began this morning. Pt reports hx of htn.

## 2014-04-02 NOTE — ED Notes (Signed)
Pt also reports lightheadedness that began this morning.

## 2014-04-03 ENCOUNTER — Encounter (HOSPITAL_COMMUNITY): Payer: Self-pay | Admitting: Adult Health

## 2014-04-03 DIAGNOSIS — I379 Nonrheumatic pulmonary valve disorder, unspecified: Secondary | ICD-10-CM

## 2014-04-03 LAB — BASIC METABOLIC PANEL
BUN: 19 mg/dL (ref 6–23)
CO2: 29 mEq/L (ref 19–32)
Calcium: 9.4 mg/dL (ref 8.4–10.5)
Chloride: 102 mEq/L (ref 96–112)
Creatinine, Ser: 0.94 mg/dL (ref 0.50–1.35)
GFR, EST NON AFRICAN AMERICAN: 89 mL/min — AB (ref >90)
Glucose, Bld: 106 mg/dL — ABNORMAL HIGH (ref 70–99)
POTASSIUM: 4.3 meq/L (ref 3.7–5.3)
SODIUM: 142 meq/L (ref 137–147)

## 2014-04-03 LAB — CBC
HCT: 38.6 % — ABNORMAL LOW (ref 39.0–52.0)
HEMOGLOBIN: 13.2 g/dL (ref 13.0–17.0)
MCH: 31.4 pg (ref 26.0–34.0)
MCHC: 34.2 g/dL (ref 30.0–36.0)
MCV: 91.7 fL (ref 78.0–100.0)
Platelets: 377 10*3/uL (ref 150–400)
RBC: 4.21 MIL/uL — AB (ref 4.22–5.81)
RDW: 12.3 % (ref 11.5–15.5)
WBC: 8 10*3/uL (ref 4.0–10.5)

## 2014-04-03 LAB — TROPONIN I: Troponin I: <0.3 ng/mL (ref <0.30)

## 2014-04-03 LAB — TSH: TSH: 0.949 u[IU]/mL (ref 0.350–4.500)

## 2014-04-03 LAB — HEMOGLOBIN A1C
Hgb A1c MFr Bld: 6.4 % — ABNORMAL HIGH (ref <5.7)
MEAN PLASMA GLUCOSE: 137 mg/dL — AB (ref <117)

## 2014-04-03 MED ORDER — ATORVASTATIN CALCIUM 40 MG PO TABS
40.0000 mg | ORAL_TABLET | Freq: Every day | ORAL | Status: DC
  Administered 2014-04-03: 40 mg via ORAL
  Filled 2014-04-03: qty 1

## 2014-04-03 MED ORDER — ASPIRIN EC 81 MG PO TBEC
81.0000 mg | DELAYED_RELEASE_TABLET | Freq: Every day | ORAL | Status: DC
  Administered 2014-04-03 – 2014-04-04 (×2): 81 mg via ORAL
  Filled 2014-04-03 (×2): qty 1

## 2014-04-03 NOTE — Progress Notes (Signed)
Subjective: Patient was admitted yesterday due to chest pain and dizziness. He feels better today. No new complaint.  Objective: Vital signs in last 24 hours: Temp:  [97 F (36.1 C)-98.6 F (37 C)] 98.6 F (37 C) (04/23 0631) Pulse Rate:  [66-91] 67 (04/23 0631) Resp:  [16-21] 20 (04/23 0631) BP: (122-171)/(73-102) 144/91 mmHg (04/23 0631) SpO2:  [95 %-100 %] 96 % (04/23 0631) Weight:  [100.5 kg (221 lb 9 oz)] 100.5 kg (221 lb 9 oz) (04/22 1624) Weight change:  Last BM Date: 04/02/14  Intake/Output from previous day: 04/22 0701 - 04/23 0700 In: 1280 [P.O.:680; I.V.:600] Out: 500 [Urine:500]  PHYSICAL EXAM General appearance: alert and no distress Resp: clear to auscultation bilaterally Cardio: S1, S2 normal GI: soft, non-tender; bowel sounds normal; no masses,  no organomegaly Extremities: extremities normal, atraumatic, no cyanosis or edema  Lab Results:    @labtest @ [facility] No results found for this basename: PHART, PCO2, PO2ART, TCO2, HCO3,  in the last 72 hours CULTURES No results found for this or any previous visit (from the past 240 hour(s)). Studies/Results: Dg Chest 2 View  04/02/2014   CLINICAL DATA:  Chest pain  EXAM: CHEST  2 VIEW  COMPARISON:  Apr 22, 2013  FINDINGS: There is mild right base atelectasis. Lungs are otherwise clear. Heart size and pulmonary vascularity are normal. No adenopathy. Aorta is mildly tortuous. No pneumothorax. No bone lesions.  IMPRESSION: Mild right base atelectatic change.  No edema or consolidation.   Electronically Signed   By: Lowella Grip M.D.   On: 04/02/2014 12:48    Medications: I have reviewed the patient's current medications.  Assesment:  Principal Problem:   Chest pain Active Problems:   Prostate cancer   Near syncope   Hypokalemia   Chest pain at rest    Plan:  Medications reviewed Continue telemetry Cardiology consult pending.    LOS: 1 day   Walter Boone 04/03/2014, 7:58 AM

## 2014-04-03 NOTE — Progress Notes (Signed)
*  PRELIMINARY RESULTS* Echocardiogram 2D Echocardiogram has been performed.  Walter Boone 04/03/2014, 9:21 AM

## 2014-04-03 NOTE — Consult Note (Signed)
CARDIOLOGY CONSULT NOTE   [name] MRN: [id number] DOB/AGE: 1951-01-11 63 y.o.  Admit Date: 04/02/2014 Referring Physician: Legrand Rams MD Primary Physician: [name], MD Consulting Cardiologist: Rozann Lesches MD Primary Cardiologist New Reason for Consultation: Chest Pain   Clinical Summary Walter Boone is a 63 y.o.male with no prior cardiac history admitted via ER with near syncope and sharp left sided chest pain, with hypertension.  He was sitting at his desk when he had a sudden episode of dizziness with a "funny feeling" in the back of his head, headache, near syncope diaphoresis, and sharp pain on the left chest. Found to be hypertensive at work, with self reported BP of 168/102.     In ER the patient's blood pressure is 150/86, heart rate 91, O2 sat 95%, afebrile. Hemoglobin A1c was 6.4. EKG revealed normal sinus rhythm without evidence of ACS. Followup troponin I., negative x2. Is not found to be anemic, or hypoglycemic. CT scan of the head was negative for any acute abnormality or old infarctions.   He had a separate episode on 03/12/2014 as well and reported to ER at that time treated with benedryl and reglan, then released.   He has a history of prostate CA and is being followed by oncology with remote radiation therapy and chemo. CVRF of hypertension, hyperlipidemia, with a family history of heart disease. The patient has been medically compliant with antihypertensive medications. He states that his blood pressure has been slightly elevated over the last several months. And he actually has an appointment with cardiology for further evaluation and establishment with our practice next month.   No Known Allergies  Medications Scheduled Medications: . bicalutamide  50 mg Oral Daily  . enoxaparin (LOVENOX) injection  40 mg Subcutaneous Q24H  . sodium chloride  3 mL Intravenous Q12H    Infusions: . sodium chloride 50 mL/hr at 04/02/14 1642    PRN  Medications: acetaminophen, acetaminophen, alum & mag hydroxide-simeth, bisacodyl, hydrALAZINE, HYDROcodone-acetaminophen, morphine injection, nitroGLYCERIN, ondansetron (ZOFRAN) IV, ondansetron, senna-docusate   Past Medical History  Diagnosis Date  . Prostate cancer   . Fatty liver   . Hypertension   . Hyperlipidemia   . Hemorrhoids     history  . Prostate cancer 06/20/2011  . Prostate carcinoma, recurrent 08/10/2012  . Gynecomastia, male 01/11/2013    Secondary to prostate ca Tx.     Past Surgical History  Procedure Laterality Date  . Prostate biopsy  11/06  . Esophagogastroduodenoscopy    . Knee surgery      left  . Ankle surgery  1987    left  . Hernia repair  2010  . Colonoscopy  2008    Family History  Problem Relation Age of Onset  . Diabetes Father   . Cancer Sister   . Diabetes Brother     Social History Walter Boone reports that he has quit smoking. He has never used smokeless tobacco. Walter Boone reports that he does not drink alcohol.  Review of Systems Otherwise reviewed and negative except as outlined.  Physical Examination Blood pressure 144/91, pulse 67, temperature 98.6 F (37 C), temperature source Oral, resp. rate 20, height 5\' 7"  (1.702 m), weight 221 lb 9 oz (100.5 kg), SpO2 96.00%.  Intake/Output Summary (Last 24 hours) at 04/03/14 0803 Last data filed at 04/03/14 0600  Gross per 24 hour  Intake   1280 ml  Output    500 ml  Net    780 ml  Telemetry: NSR  GEN: Awake, no recurrence of chest pain or symptoms. HEENT: Conjunctiva and lids normal, oropharynx clear with moist mucosa. Neck: Supple, no elevated JVP or carotid bruits, no thyromegaly. Lungs: Clear to auscultation, nonlabored breathing at rest. Cardiac: Regular rate and rhythm, no S3 or significant systolic murmur, no pericardial rub. Abdomen: Soft, nontender, no hepatomegaly, bowel sounds present, no guarding or rebound. Extremities: No pitting edema, distal pulses 2+. Skin:  Warm and dry. Musculoskeletal: No kyphosis. Neuropsychiatric: Alert and oriented x3, affect grossly appropriate.  Prior Cardiac Testing/Procedures NONE  Lab Results  Basic Metabolic Panel:  Recent Labs Lab 04/02/14 1228 04/03/14 0445  NA 140 142  K 3.6* 4.3  CL 100 102  CO2 29 29  GLUCOSE 101* 106*  BUN 22 19  CREATININE 0.89 0.94  CALCIUM 9.8 9.4    Liver Function Tests:  Recent Labs Lab 04/02/14 1228  AST 20  ALT 21  ALKPHOS 61  BILITOT 0.3  PROT 7.0  ALBUMIN 3.9    CBC:  Recent Labs Lab 04/02/14 1228 04/03/14 0445  WBC 9.0 8.0  HGB 13.7 13.2  HCT 39.6 38.6*  MCV 90.4 91.7  PLT 403* 377    Cardiac Enzymes:  Recent Labs Lab 04/02/14 1228 04/02/14 1828 04/02/14 2331  TROPONINI <0.30 <0.30 <0.30    Radiology: Dg Chest 2 View  04/02/2014   CLINICAL DATA:  Chest pain  EXAM: CHEST  2 VIEW  COMPARISON:  Apr 22, 2013  FINDINGS: There is mild right base atelectasis. Lungs are otherwise clear. Heart size and pulmonary vascularity are normal. No adenopathy. Aorta is mildly tortuous. No pneumothorax. No bone lesions.  IMPRESSION: Mild right base atelectatic change.  No edema or consolidation.   Electronically Signed   By: Lowella Grip M.D.   On: 04/02/2014 12:48    ECG: NSR   Impression and Recommendations  1. Near syncope: Uncertain of cardiac etiology. The patient has had 2 episodes within the last 2 weeks of unknown etiology. He has been having issues with hypertension, and headaches. The episodes are associated with diaphoresis, and generalized weakness. He missed a generalized weakness since being treated for prostate cancer, but during these episodes he becomes profound. Review of orthostatic blood pressures, demonstrates approximate 10 point systolic drop from 408/14 to 139/102. He was asymptomatic. Cannot rule out cardiac arrhythmia as etiology. We will continue to monitor him on telemetry. There has not been episodes of sustained bradycardia  or pauses on review of telemetry.  2. Hypertension: Difficult to control lately per patient. He is on HCTZ 25 mg daily at home only. He has been started on hydralazine here in the hospital when necessary 5 mg IV. Review of transfer reveals blood pressure should 122/80-171/92. Consider adding low-dose ACE inhibitor. Echocardiogram has been ordered. Further recommendations based upon study results.  3. Hypercholesterolemia: Labs today cholesterol 236, triglycerides 232, HDL 46, LDL 144, VLDL 46. Elevation in triglycerides, without use of EtOH, consider evaluation for diabetes with mildly elevated hemoglobin A1c on admission of 6.4. We will begin Lipitor 40 mg daily.  4. Chest Pain: Appears atypical, but with cardiovascular risk factors, a stress test would be beneficial to him for evaluation of ischemia causing symptoms. He is currently pain-free. Her markers are negative, arguing against ACS. This can be done as an outpatient unless he becomes symptomatic again. Will review echo and make further recommendations.  5. Hx of Prostate CA: Radiology, [name] injections, of oncology. He is recently been started on Zoloft.  Signed: Phill Myron. Purcell Nails NP [facility] 04/03/2014, 8:03 AM Co-Sign MD  Patient seen and discussed with NP Purcell Nails. 63 yo male hx of prostate CA, HTN, HL, fatty liver admitted with chest pain and near syncope. 2 episodes over the last 3 weeks. Reports yesterday while sitting at work all of a sudden felt flush, diaphoretic, weak all over with pressure in his head. + nausea.  Felt 2 strong "thumps" in his chest, mild SOB. He got up to see the nurse at work who checked his vitals, was told his blood pressure was elevated. He reports similar episode a few weeks ago, more severe in that he felt very lightheaded and nearly passed out. Once again had 2 strong "thumps" in his chest. Was worked up in [place] in early April with no acute findings, discharged with outpatient follow up.  Repeat episode yesterday as described above. He also reports over the last year increased DOE, now can walk only 1/2 block. Denies any orthopnea, PND. Does have some occasional LE edema. He reports heavy coffee and tea consumption during the day and at work.  EKG NSR with no ischemic changes, trop neg x 3, D-dimer negative, TSH 0.949, Hgb A1c 6.4. CXR mild right base atelectasis, no acute process.  Orthostatics are positive by pulse (hr 67-->87).  Unclear etiology of his symptoms at this time. Will follow up echo results and continue telemetry. Possible symptomatic arrhythmia vs atypical ischemia, his Hgb A1c show is prediabetic and just nearly meeting criteria for DM, putting him at risk for atypical angina. His heavy caffeine use puts him at increased risk for arrhythmia, and possibly explains him being orthostatic. Would consider alternative bp agent at home other than diuretic. Start ASA with ongoing ischemic workup.  Pending echo results, would anticipate stress test tomorrow AM. Lipids drawn at noon and unclear if true fasting, will repeat fasting panel tomorrow AM. Echo is pending.   Carlyle Dolly MD

## 2014-04-03 NOTE — Progress Notes (Signed)
UR completed 

## 2014-04-04 ENCOUNTER — Telehealth: Payer: Self-pay

## 2014-04-04 ENCOUNTER — Observation Stay (HOSPITAL_COMMUNITY): Payer: PRIVATE HEALTH INSURANCE

## 2014-04-04 ENCOUNTER — Encounter (HOSPITAL_COMMUNITY): Payer: Self-pay

## 2014-04-04 ENCOUNTER — Other Ambulatory Visit: Payer: Self-pay

## 2014-04-04 DIAGNOSIS — E876 Hypokalemia: Secondary | ICD-10-CM

## 2014-04-04 DIAGNOSIS — R079 Chest pain, unspecified: Secondary | ICD-10-CM

## 2014-04-04 LAB — LIPID PANEL
CHOL/HDL RATIO: 5 ratio
CHOLESTEROL: 223 mg/dL — AB (ref 0–200)
HDL: 45 mg/dL (ref >39)
LDL Cholesterol: 132 mg/dL — ABNORMAL HIGH (ref 0–99)
TRIGLYCERIDES: 229 mg/dL — AB (ref <150)
VLDL: 46 mg/dL — ABNORMAL HIGH (ref 0–40)

## 2014-04-04 MED ORDER — ATORVASTATIN CALCIUM 40 MG PO TABS: 40.0000 mg | ORAL_TABLET | Freq: Every day | ORAL | Status: DC

## 2014-04-04 MED ORDER — SODIUM CHLORIDE 0.9 % IJ SOLN
INTRAMUSCULAR | Status: AC
  Administered 2014-04-04: 10 mL via INTRAVENOUS
  Filled 2014-04-04: qty 10

## 2014-04-04 MED ORDER — ASPIRIN 81 MG PO TBEC: 81.0000 mg | DELAYED_RELEASE_TABLET | Freq: Every day | ORAL | Status: DC

## 2014-04-04 MED ORDER — TECHNETIUM TC 99M SESTAMIBI - CARDIOLITE
10.0000 | Freq: Once | INTRAVENOUS | Status: AC | PRN
  Administered 2014-04-04: 10 via INTRAVENOUS

## 2014-04-04 MED ORDER — REGADENOSON 0.4 MG/5ML IV SOLN
INTRAVENOUS | Status: AC
  Administered 2014-04-04: 0.4 mg via INTRAVENOUS
  Filled 2014-04-04: qty 5

## 2014-04-04 MED ORDER — TECHNETIUM TC 99M SESTAMIBI GENERIC - CARDIOLITE
30.0000 | Freq: Once | INTRAVENOUS | Status: AC | PRN
  Administered 2014-04-04: 30 via INTRAVENOUS

## 2014-04-04 NOTE — Progress Notes (Signed)
Stress Lab Nurses Notes - Walter Boone  Walter Boone 04/04/2014 Reason for doing test: Chest Pain Type of test: Wille Glaser Nurse performing test: Gerrit Halls, RN Nuclear Medicine Tech: Melburn Hake Echo Tech: Not Applicable MD performing test: Branch/K.Lawrence NP Family MD: Legrand Rams Test explained and consent signed: yes IV started: 22g jelco, Saline lock flushed, No redness or edema and Saline lock from floor Symptoms: Chest pressure  & nausea  Treatment/Intervention: None Reason test stopped: protocol completed After recovery IV was: No redness or edema and Saline Lock flushed Patient to return to [place]. Med at : 11:00 Patient discharged: Transported back to room 301 via wc Patient's Condition upon discharge was: stable Comments: During stress test BP 118/78 & HR 94.  Recovery BP 122/80 & HR 85.  Symptoms resolved in recovery. Donnajean Lopes

## 2014-04-04 NOTE — Progress Notes (Signed)
Dr. Legrand Rams notified of stress test results.

## 2014-04-04 NOTE — Progress Notes (Signed)
Subjective: Patient is resting. He feels better. His chest pain has subsiding. Patient was evaluated by cardiology and scheduled for stress test. His Echo is normal.t.  Objective: Vital signs in last 24 hours: Temp:  [97.2 F (36.2 C)-98 F (36.7 C)] 97.2 F (36.2 C) (04/24 0423) Pulse Rate:  [71-76] 71 (04/24 0423) Resp:  [20] 20 (04/24 0423) BP: (121-141)/(76-94) 141/94 mmHg (04/24 0423) SpO2:  [94 %-96 %] 94 % (04/24 0423) Weight:  [102.4 kg (225 lb 12 oz)] 102.4 kg (225 lb 12 oz) (04/24 0625) Weight change: 1.9 kg (4 lb 3 oz) Last BM Date: 04/03/14  Intake/Output from previous day: 04/23 0701 - 04/24 0700 In: 2040 [P.O.:840; I.V.:1200] Out: 300 [Urine:300]  PHYSICAL EXAM General appearance: alert and no distress Resp: clear to auscultation bilaterally Cardio: S1, S2 normal GI: soft, non-tender; bowel sounds normal; no masses,  no organomegaly Extremities: extremities normal, atraumatic, no cyanosis or edema  Lab Results:    @labtest @ [facility] No results found for this basename: PHART, PCO2, PO2ART, TCO2, HCO3,  in the last 72 hours CULTURES No results found for this or any previous visit (from the past 240 hour(s)). Studies/Results: Dg Chest 2 View  04/02/2014   CLINICAL DATA:  Chest pain  EXAM: CHEST  2 VIEW  COMPARISON:  Apr 22, 2013  FINDINGS: There is mild right base atelectasis. Lungs are otherwise clear. Heart size and pulmonary vascularity are normal. No adenopathy. Aorta is mildly tortuous. No pneumothorax. No bone lesions.  IMPRESSION: Mild right base atelectatic change.  No edema or consolidation.   Electronically Signed   By: Lowella Grip M.D.   On: 04/02/2014 12:48    Medications: I have reviewed the patient's current medications.  Assesment:  Principal Problem:   Chest pain Active Problems:   Prostate cancer   Near syncope   Hypokalemia   Chest pain at rest    Plan:  Medications reviewed Continue telemetry Cardiology consult  appreciated Stress test as planned    LOS: 2 days   Walter Boone 04/04/2014, 8:16 AM

## 2014-04-04 NOTE — Telephone Encounter (Signed)
Contacted pt regarding Dr.Branch's order for 14 day cardiac monitoring .Patient states he is going to [place] in 2 days and would be gone for approx 4 days.He has the toll free number [phone] ext [phone] to [employer] cardiac monitoring service to schedule to have the monitor mailed to his home upon his return. He states he will call our office and reschedule his 2 week post hospital follow up after he has worn the 14 day monitor.    Will route message to Dr.Branch

## 2014-04-04 NOTE — Progress Notes (Signed)
Consulting cardiologist:Roseana Rhine, Roderic Palau MD Primary Cardiologist: Carlyle Dolly MD  Subjective:   Feeling better. Chest pain is better.    Objective:   Temp:  [97.2 F (36.2 C)-98 F (36.7 C)] 97.2 F (36.2 C) (04/24 0423) Pulse Rate:  [71-76] 71 (04/24 0423) Resp:  [20] 20 (04/24 0423) BP: (121-141)/(76-94) 141/94 mmHg (04/24 0423) SpO2:  [94 %-96 %] 94 % (04/24 0423) Weight:  [225 lb 12 oz (102.4 kg)] 225 lb 12 oz (102.4 kg) (04/24 0625) Last BM Date: 04/03/14  Filed Weights   04/02/14 1624 04/04/14 0625  Weight: 221 lb 9 oz (100.5 kg) 225 lb 12 oz (102.4 kg)    Intake/Output Summary (Last 24 hours) at 04/04/14 0936 Last data filed at 04/04/14 0600  Gross per 24 hour  Intake   2040 ml  Output    300 ml  Net   1740 ml    Telemetry: NSR  Exam:  General: No acute distress.  HEENT: Conjunctiva and lids normal, oropharynx clear.  Lungs: Clear to auscultation, nonlabored.  Cardiac: No elevated JVP or bruits. RRR, no gallop or rub.   Abdomen: Normoactive bowel sounds, nontender, nondistended.  Extremities: No pitting edema, distal pulses full.  Neuropsychiatric: Alert and oriented x3, affect appropriate.   Lab Results:  Basic Metabolic Panel:  Recent Labs Lab 04/02/14 1228 04/03/14 0445  NA 140 142  K 3.6* 4.3  CL 100 102  CO2 29 29  GLUCOSE 101* 106*  BUN 22 19  CREATININE 0.89 0.94  CALCIUM 9.8 9.4    Liver Function Tests:  Recent Labs Lab 04/02/14 1228  AST 20  ALT 21  ALKPHOS 61  BILITOT 0.3  PROT 7.0  ALBUMIN 3.9    CBC:  Recent Labs Lab 04/02/14 1228 04/03/14 0445  WBC 9.0 8.0  HGB 13.7 13.2  HCT 39.6 38.6*  MCV 90.4 91.7  PLT 403* 377    Cardiac Enzymes:  Recent Labs Lab 04/02/14 1228 04/02/14 1828 04/02/14 2331  TROPONINI <0.30 <0.30 <0.30    BNP: No results found for this basename: PROBNP,  in the last 8760 hours  Coagulation:  Recent Labs Lab 04/02/14 1228  INR 0.95    Radiology: Dg  Chest 2 View  04/02/2014   CLINICAL DATA:  Chest pain  EXAM: CHEST  2 VIEW  COMPARISON:  Apr 22, 2013  FINDINGS: There is mild right base atelectasis. Lungs are otherwise clear. Heart size and pulmonary vascularity are normal. No adenopathy. Aorta is mildly tortuous. No pneumothorax. No bone lesions.  IMPRESSION: Mild right base atelectatic change.  No edema or consolidation.   Electronically Signed   By: Lowella Grip M.D.   On: 04/02/2014 12:48   Echocardiogram: 04/03/2014 Study data: Technically adequate study. - Left ventricle: The cavity size was normal. Wall thickness was normal. Systolic function was normal. The estimated ejection fraction was in the range of 60% to 65%. Wall motion was normal; there were no regional wall motion abnormalities. Doppler parameters are consistent with abnormal left ventricular relaxation (grade 1 diastolic dysfunction). - Aortic valve: Valve area: 2.24cm^2(VTI). Valve area: 2.2cm^2 (Vmax). - Right ventricle: The cavity size was mildly dilated. The dilatation is most prominent at the apex.    ECG:   Medications:   Scheduled Medications: . aspirin EC  81 mg Oral Daily  . atorvastatin  40 mg Oral q1800  . bicalutamide  50 mg Oral Daily  . enoxaparin (LOVENOX) injection  40 mg Subcutaneous Q24H  . sodium chloride  3 mL Intravenous Q12H     Infusions: . sodium chloride 50 mL/hr at 04/02/14 1642     PRN Medications:  acetaminophen, acetaminophen, alum & mag hydroxide-simeth, bisacodyl, hydrALAZINE, HYDROcodone-acetaminophen, morphine injection, nitroGLYCERIN, ondansetron (ZOFRAN) IV, ondansetron, senna-docusate, technetium sestamibi generic   Assessment and Plan:   1.Near syncope: No further episodes of this or headache. No diaphoresis. He is assessed in stress lab. Planned NM study today. More recommendations post scintigraphy study.   2. Hypertension: Controlled currently. No need for hydralazine IV. Consider low dose ACE or ARB. Echo  demonstrates grade I diastolic dysfunction.   3. Chest Pain: No recurrence. Normal cardiac markers. Atypical presentation.   4. Hypercholesterolemia: Repeat fasting continues to reveal LDL elevation along with TG and TC elevation. Continue atorvastatin started yesterday.   Phill Myron. Purcell Nails NP [facility] 04/04/2014, 9:36 AM  Attending Note Patient seen and discussed with NP Purcell Nails. Echo with normal LVEF at 60-65%, no wall motion abnormalities, and grade I diastolic dysfunction. Lexiscan MPI without evidence of ischemia. Elevated lipid panel, initiated on atorvastatin. Given prediabetes would consider starting metformin. Thus far no clear cause of his symptoms, remain very suggestive of symptomatic arrhythmia. Will arrange 14 day event monitor with follow with [place] in 3 weeks. I have spoken to our office to arrange both. Counseled to cut down on caffeine intake as he drinks up to 3 cups of coffee a day along with glasses of tea. Given his elevated blood pressures and prediabetes will start lisinopril 5mg  daily, will need repeat BMET in 2 weeks after starting.   Carlyle Dolly MD

## 2014-04-04 NOTE — Discharge Summary (Signed)
Physician Discharge Summary  [name] MRN: [id number] DOB/AGE: [birth year] 63 y.o. Primary Care Physician:HAWKINS,EDWARD L, MD Admit date: 04/02/2014 Discharge date: 04/04/2014    Discharge Diagnoses:   Principal Problem:   Chest pain Active Problems:   Prostate cancer   Near syncope   Hypokalemia   Chest pain at rest     Medication List         aspirin 81 MG EC tablet  Take 1 tablet (81 mg total) by mouth daily.     atorvastatin 40 MG tablet  Commonly known as:  LIPITOR  Take 1 tablet (40 mg total) by mouth daily at 6 PM.     bicalutamide 50 MG tablet  Commonly known as:  CASODEX  Take 1 tablet (50 mg total) by mouth daily.     CALCIUM PO  Take 1 tablet by mouth 2 (two) times daily.     hydrochlorothiazide 25 MG tablet  Commonly known as:  HYDRODIURIL  Take 25 mg by mouth daily.     ibuprofen 800 MG tablet  Commonly known as:  ADVIL,MOTRIN  Take 1,600 mg by mouth daily as needed for headache.        Discharged Condition improved    Consults: cardiology Significant Diagnostic Studies: Dg Chest 2 View  04/02/2014   CLINICAL DATA:  Chest pain  EXAM: CHEST  2 VIEW  COMPARISON:  Apr 22, 2013  FINDINGS: There is mild right base atelectasis. Lungs are otherwise clear. Heart size and pulmonary vascularity are normal. No adenopathy. Aorta is mildly tortuous. No pneumothorax. No bone lesions.  IMPRESSION: Mild right base atelectatic change.  No edema or consolidation.   Electronically Signed   By: Lowella Grip M.D.   On: 04/02/2014 12:48   Ct Head Wo Contrast  03/12/2014   CLINICAL DATA:  Dizziness.  Hypertension.  EXAM: CT HEAD WITHOUT CONTRAST  TECHNIQUE: Contiguous axial images were obtained from the base of the skull through the vertex without intravenous contrast.  COMPARISON:  08/10/2011  FINDINGS: The brain has a normal appearance without evidence of malformation, atrophy, old or acute infarction, mass lesion, hemorrhage, hydrocephalus  or extra-axial collection. The calvarium appears normal. Visualized sinuses, middle ears and mastoids are clear.  IMPRESSION: Normal head CT.  No change since the previous studies.   Electronically Signed   By: Nelson Chimes M.D.   On: 03/12/2014 10:47   Nm Myocar Single W/spect W/wall Motion And Ef  04/04/2014   CLINICAL DATA:  63 year old male with no known history of coronary artery disease referred for chest pain.  EXAM: MYOCARDIAL IMAGING WITH SPECT (REST AND PHARMACOLOGIC-STRESS)  GATED LEFT VENTRICULAR WALL MOTION STUDY  LEFT VENTRICULAR EJECTION FRACTION  TECHNIQUE: Standard myocardial SPECT imaging was performed after resting intravenous injection of 10 mCi Tc-26m sestamibi. Subsequently, intravenous infusion of Lexiscan was performed under the supervision of the Cardiology staff. At peak effect of the drug, 30 mCi Tc-13m sestamibi was injected intravenously and standard myocardial SPECT imaging was performed. Quantitative gated imaging was also performed to evaluate left ventricular wall motion, and estimate left ventricular ejection fraction.  COMPARISON:  None.  FINDINGS: Pharmacological stress  Baseline EKG showed normal sinus rhythm with nonspecific ST/T changes. Following injection heart rate increased from 73 beats per min up to 97 beats per min and blood pressure decreased from 129/94 down to 118/78. The test was stopped after injection was completed. The patient did not experience any chest pain.  Post-injection EKG did not show any specific  ischemic changes or significant arrhythmias.  Myocardial perfusion imaging  Raw images showed appropriate radiotracer uptake. There were no myocardial perfusion defects.  Gated imaging showed end-diastolic volume 94 mL, and systolic volume 38 mL, left ventricular ejection fraction 60%, TID 1.01. There was normal wall motion.  IMPRESSION: 1.  Negative Lexiscan MPI for ischemia  2.  Normal left ventricular systolic function, LVEF 25%  3.  Low risk study for  major cardiac events.   Electronically Signed   By: Carlyle Dolly   On: 04/04/2014 11:52    Lab Results: Basic Metabolic Panel:  Recent Labs  04/02/14 1228 04/03/14 0445  NA 140 142  K 3.6* 4.3  CL 100 102  CO2 29 29  GLUCOSE 101* 106*  BUN 22 19  CREATININE 0.89 0.94  CALCIUM 9.8 9.4   Liver Function Tests:  Recent Labs  04/02/14 1228  AST 20  ALT 21  ALKPHOS 61  BILITOT 0.3  PROT 7.0  ALBUMIN 3.9     CBC:  Recent Labs  04/02/14 1228 04/03/14 0445  WBC 9.0 8.0  HGB 13.7 13.2  HCT 39.6 38.6*  MCV 90.4 91.7  PLT 403* 377    No results found for this or any previous visit (from the past 240 hour(s)).   Hospital Course: Patient was admitted due to chest pain. Patient had serial EKG and cardiac enzyme which was negative. He had also stress test done cardiology. His symptoms resolved and is discharge in stable condition  Discharge Exam: Blood pressure 141/94, pulse 71, temperature 97.2 F (36.2 C), temperature source Oral, resp. rate 20, height 5\' 7"  (1.702 m), weight 102.4 kg (225 lb 12 oz), SpO2 94.00%.   Disposition:       Future Appointments Provider Department Dept Phone   04/18/2014 8:40 AM Arnoldo Lenis, MD [facility] [place] [phone]   05/15/2014 10:00 AM [facility] Provider [place] [phone]   05/15/2014 10:30 AM [facility] [place] [phone]   05/15/2014 11:00 AM [place] [phone]      Follow-up Information   Follow up with HAWKINS,EDWARD L, MD In 1 month.   Specialty:  Pulmonary Disease   Contact information:   [place] [address] [phone]       Signed: Rosita Fire   04/04/2014, 12:32 PM

## 2014-04-05 NOTE — Progress Notes (Signed)
Late entry for 04/05/2014 - AVS reviewed with patient.  Verbalized understanding of discharge instructions, physician follow-up and medications.  [facility] stated that they would mail the patient his event monitor.  Patient's IV removed.  Site WNL.  Patient reports all belongings intact and in possession at time of discharge.  Patient stable at time of discharge.

## 2014-04-08 ENCOUNTER — Ambulatory Visit: Payer: PRIVATE HEALTH INSURANCE | Admitting: Cardiology

## 2014-04-14 ENCOUNTER — Ambulatory Visit (INDEPENDENT_AMBULATORY_CARE_PROVIDER_SITE_OTHER): Payer: PRIVATE HEALTH INSURANCE | Admitting: *Deleted

## 2014-04-14 DIAGNOSIS — R55 Syncope and collapse: Secondary | ICD-10-CM

## 2014-04-14 NOTE — Progress Notes (Signed)
Pt came in for nurse to put on [employer] Monitor.

## 2014-04-18 ENCOUNTER — Ambulatory Visit: Payer: PRIVATE HEALTH INSURANCE | Admitting: Cardiology

## 2014-04-25 ENCOUNTER — Encounter: Payer: PRIVATE HEALTH INSURANCE | Admitting: Adult Health

## 2014-05-06 ENCOUNTER — Encounter: Payer: PRIVATE HEALTH INSURANCE | Admitting: Adult Health

## 2014-05-07 ENCOUNTER — Encounter: Payer: PRIVATE HEALTH INSURANCE | Admitting: Adult Health

## 2014-05-07 ENCOUNTER — Encounter: Payer: Self-pay | Admitting: *Deleted

## 2014-05-07 ENCOUNTER — Encounter: Payer: Self-pay | Admitting: Adult Health

## 2014-05-07 NOTE — Progress Notes (Signed)
Error. No Show 

## 2014-05-08 ENCOUNTER — Other Ambulatory Visit (HOSPITAL_COMMUNITY): Payer: Self-pay

## 2014-05-13 ENCOUNTER — Other Ambulatory Visit: Payer: Self-pay

## 2014-05-13 ENCOUNTER — Other Ambulatory Visit: Payer: Self-pay | Admitting: *Deleted

## 2014-05-13 DIAGNOSIS — R55 Syncope and collapse: Secondary | ICD-10-CM

## 2014-05-15 ENCOUNTER — Encounter (HOSPITAL_BASED_OUTPATIENT_CLINIC_OR_DEPARTMENT_OTHER): Payer: PRIVATE HEALTH INSURANCE

## 2014-05-15 ENCOUNTER — Encounter (HOSPITAL_COMMUNITY): Payer: PRIVATE HEALTH INSURANCE | Attending: Oncology

## 2014-05-15 ENCOUNTER — Other Ambulatory Visit (HOSPITAL_COMMUNITY): Payer: PRIVATE HEALTH INSURANCE

## 2014-05-15 ENCOUNTER — Encounter (HOSPITAL_COMMUNITY): Payer: Self-pay

## 2014-05-15 ENCOUNTER — Ambulatory Visit (HOSPITAL_COMMUNITY): Payer: PRIVATE HEALTH INSURANCE | Admitting: Oncology

## 2014-05-15 VITALS — BP 104/68 | HR 94 | Temp 97.8°F | Resp 18 | Wt 221.9 lb

## 2014-05-15 DIAGNOSIS — N62 Hypertrophy of breast: Secondary | ICD-10-CM

## 2014-05-15 DIAGNOSIS — R232 Flushing: Secondary | ICD-10-CM

## 2014-05-15 DIAGNOSIS — C61 Malignant neoplasm of prostate: Secondary | ICD-10-CM

## 2014-05-15 LAB — CBC WITH DIFFERENTIAL/PLATELET
BASOS PCT: 0 % (ref 0–1)
Basophils Absolute: 0 10*3/uL (ref 0.0–0.1)
EOS ABS: 0.3 10*3/uL (ref 0.0–0.7)
EOS PCT: 4 % (ref 0–5)
HCT: 37.3 % — ABNORMAL LOW (ref 39.0–52.0)
Hemoglobin: 12.8 g/dL — ABNORMAL LOW (ref 13.0–17.0)
Lymphocytes Relative: 21 % (ref 12–46)
Lymphs Abs: 1.6 10*3/uL (ref 0.7–4.0)
MCH: 30.8 pg (ref 26.0–34.0)
MCHC: 34.3 g/dL (ref 30.0–36.0)
MCV: 89.9 fL (ref 78.0–100.0)
MONOS PCT: 6 % (ref 3–12)
Monocytes Absolute: 0.5 10*3/uL (ref 0.1–1.0)
NEUTROS PCT: 69 % (ref 43–77)
Neutro Abs: 5.2 10*3/uL (ref 1.7–7.7)
PLATELETS: 340 10*3/uL (ref 150–400)
RBC: 4.15 MIL/uL — ABNORMAL LOW (ref 4.22–5.81)
RDW: 12.7 % (ref 11.5–15.5)
WBC: 7.6 10*3/uL (ref 4.0–10.5)

## 2014-05-15 LAB — COMPREHENSIVE METABOLIC PANEL
ALBUMIN: 4 g/dL (ref 3.5–5.2)
ALT: 23 U/L (ref 0–53)
AST: 22 U/L (ref 0–37)
Alkaline Phosphatase: 68 U/L (ref 39–117)
BILIRUBIN TOTAL: 0.5 mg/dL (ref 0.3–1.2)
BUN: 24 mg/dL — ABNORMAL HIGH (ref 6–23)
CO2: 27 meq/L (ref 19–32)
Calcium: 10.1 mg/dL (ref 8.4–10.5)
Chloride: 99 mEq/L (ref 96–112)
Creatinine, Ser: 0.91 mg/dL (ref 0.50–1.35)
GFR calc Af Amer: >90 mL/min (ref >90)
Glucose, Bld: 139 mg/dL — ABNORMAL HIGH (ref 70–99)
Potassium: 3.9 mEq/L (ref 3.7–5.3)
Sodium: 141 mEq/L (ref 137–147)
Total Protein: 7.6 g/dL (ref 6.0–8.3)

## 2014-05-15 MED ORDER — LEUPROLIDE ACETATE (3 MONTH) 22.5 MG IM KIT
22.5000 mg | PACK | Freq: Once | INTRAMUSCULAR | Status: DC
  Filled 2014-05-15: qty 22.5

## 2014-05-15 NOTE — Progress Notes (Signed)
Labs drawn for cbc/diff, cmp, psa. 

## 2014-05-15 NOTE — Progress Notes (Signed)
[place]  OFFICE PROGRESS NOTE  Walter Bogus, MD [place] [id number]  DIAGNOSIS: Prostate carcinoma, recurrent - Plan: leuprolide ([name]) injection 22.5 mg  Prostate cancer - Plan: SCHEDULING COMMUNICATION INJECTION  No chief complaint on file.   CURRENT THERAPY: Casodex 50 mg daily along with [name], last dose on 02/12/2014 given at 30 mg.  INTERVAL HISTORY: Walter Boone 63 y.o. male returns for followup and continuation of therapy for recurrent prostate cancer having received depot [name] 30 mg 3 months ago and while taking Casodex 50 mg daily.  He continues to get hot flashes and has bilateral breast pain. He fell at work and had a local anesthetic injection to his back which helped dramatically. He is scheduled to undergo ablation. He denies any diarrhea, constipation, urinary hesitancy, cough, wheezing, nausea, vomiting, diarrhea, constipation, incontinence, skin rash, headache, or seizures.  MEDICAL HISTORY: Past Medical History  Diagnosis Date  . Prostate cancer   . Fatty liver   . Hypertension   . Hyperlipidemia   . Hemorrhoids     history  . Prostate cancer 06/20/2011  . Prostate carcinoma, recurrent 08/10/2012  . Gynecomastia, male 01/11/2013    Secondary to prostate ca Tx.   Marland Kitchen History of back injury 11/19/2013    INTERIM HISTORY: has H N P-CERVICAL; CERVICAL SPASM; Prostate cancer; Gynecomastia, male; Near syncope; Chest pain; Hypokalemia; Hypertension; Hyperlipidemia; and Chest pain at rest on his problem list.    ALLERGIES:  is allergic to tylenol.  MEDICATIONS: has a current medication list which includes the following prescription(s): aspirin, atorvastatin, bicalutamide, calcium, hydrochlorothiazide, and ibuprofen, and the following Facility-Administered Medications: leuprolide and leuprolide.  SURGICAL HISTORY:  Past Surgical History  Procedure Laterality Date  . Prostate  biopsy  11/06  . Esophagogastroduodenoscopy    . Knee surgery      left  . Ankle surgery  1987    left  . Hernia repair  2010  . Colonoscopy  2008    FAMILY HISTORY: family history includes Cancer in his sister; Diabetes in his brother and father; Heart failure in his mother.  SOCIAL HISTORY:  reports that he has quit smoking. He has never used smokeless tobacco. He reports that he does not drink alcohol or use illicit drugs.  REVIEW OF SYSTEMS:  Other than that discussed above is noncontributory.  PHYSICAL EXAMINATION: ECOG PERFORMANCE STATUS: 1 - Symptomatic but completely ambulatory  Blood pressure 104/68, pulse 94, temperature 97.8 F (36.6 C), temperature source Oral, resp. rate 18, weight 221 lb 14.4 oz (100.653 kg).  GENERAL:alert, no distress and comfortable SKIN: skin color, texture, turgor are normal, no rashes or significant lesions EYES: PERLA; Conjunctiva are pink and non-injected, sclera clear SINUSES: No redness or tenderness over maxillary or ethmoid sinuses OROPHARYNX:no exudate, no erythema on lips, buccal mucosa, or tongue. NECK: supple, thyroid normal size, non-tender, without nodularity. No masses CHEST: Bilateral gynecomastia with normal AP diameter. LYMPH:  no palpable lymphadenopathy in the cervical, axillary or inguinal LUNGS: clear to auscultation and percussion with normal breathing effort HEART: regular rate & rhythm and no murmurs. ABDOMEN:abdomen soft, non-tender and normal bowel sounds MUSCULOSKELETAL:no cyanosis of digits and no clubbing. Range of motion normal.  NEURO: alert & oriented x 3 with fluent speech, no focal motor/sensory deficits   LABORATORY DATA:  Results for Walter, Boone (MRN [id number]) as of 05/15/2014 10:50  Ref. Range 10/15/2013 10:04 11/14/2013 10:02 12/16/2013 10:07  01/13/2014 10:15 02/11/2014 10:05  PSA Latest Range: <=4.00 ng/mL 0.15 0.13 0.11 0.12 0.12     Infusion on 05/15/2014  Component Date Value Ref Range Status    . WBC 05/15/2014 7.6  4.0 - 10.5 K/uL Final  . RBC 05/15/2014 4.15* 4.22 - 5.81 MIL/uL Final  . Hemoglobin 05/15/2014 12.8* 13.0 - 17.0 g/dL Final  . HCT 05/15/2014 37.3* 39.0 - 52.0 % Final  . MCV 05/15/2014 89.9  78.0 - 100.0 fL Final  . MCH 05/15/2014 30.8  26.0 - 34.0 pg Final  . MCHC 05/15/2014 34.3  30.0 - 36.0 g/dL Final  . RDW 05/15/2014 12.7  11.5 - 15.5 % Final  . Platelets 05/15/2014 340  150 - 400 K/uL Final  . Neutrophils Relative % 05/15/2014 69  43 - 77 % Final  . Neutro Abs 05/15/2014 5.2  1.7 - 7.7 K/uL Final  . Lymphocytes Relative 05/15/2014 21  12 - 46 % Final  . Lymphs Abs 05/15/2014 1.6  0.7 - 4.0 K/uL Final  . Monocytes Relative 05/15/2014 6  3 - 12 % Final  . Monocytes Absolute 05/15/2014 0.5  0.1 - 1.0 K/uL Final  . Eosinophils Relative 05/15/2014 4  0 - 5 % Final  . Eosinophils Absolute 05/15/2014 0.3  0.0 - 0.7 K/uL Final  . Basophils Relative 05/15/2014 0  0 - 1 % Final  . Basophils Absolute 05/15/2014 0.0  0.0 - 0.1 K/uL Final  . Sodium 05/15/2014 141  137 - 147 mEq/L Final  . Potassium 05/15/2014 3.9  3.7 - 5.3 mEq/L Final  . Chloride 05/15/2014 99  96 - 112 mEq/L Final  . CO2 05/15/2014 27  19 - 32 mEq/L Final  . Glucose, Bld 05/15/2014 139* 70 - 99 mg/dL Final  . BUN 05/15/2014 24* 6 - 23 mg/dL Final  . Creatinine, Ser 05/15/2014 0.91  0.50 - 1.35 mg/dL Final  . Calcium 05/15/2014 10.1  8.4 - 10.5 mg/dL Final  . Total Protein 05/15/2014 7.6  6.0 - 8.3 g/dL Final  . Albumin 05/15/2014 4.0  3.5 - 5.2 g/dL Final  . AST 05/15/2014 22  0 - 37 U/L Final  . ALT 05/15/2014 23  0 - 53 U/L Final  . Alkaline Phosphatase 05/15/2014 68  39 - 117 U/L Final  . Total Bilirubin 05/15/2014 0.5  0.3 - 1.2 mg/dL Final  . GFR calc non Af Amer 05/15/2014 >90  >90 mL/min Final  . GFR calc Af Amer 05/15/2014 >90  >90 mL/min Final   Comment: (NOTE)                          The eGFR has been calculated using the CKD EPI equation.                          This calculation  has not been validated in all clinical situations.                          eGFR's persistently <90 mL/min signify possible Chronic Kidney                          Disease.    PATHOLOGY: No new pathology.  Urinalysis    Component Value Date/Time   COLORURINE YELLOW 03/12/2014 1025   APPEARANCEUR CLEAR 03/12/2014 1025   LABSPEC 1.010 03/12/2014 1025   PHURINE  5.5 03/12/2014 1025   GLUCOSEU NEGATIVE 03/12/2014 1025   HGBUR NEGATIVE 03/12/2014 1025   [place] 03/12/2014 1025   [place] 03/12/2014 1025   PROTEINUR NEGATIVE 03/12/2014 1025   UROBILINOGEN 0.2 03/12/2014 1025   NITRITE NEGATIVE 03/12/2014 1025   LEUKOCYTESUR NEGATIVE 03/12/2014 1025    RADIOGRAPHIC STUDIES: No results found.  ASSESSMENT:  1. Recurrent prostate cancer. Now on Depo-[name] every 4 months since he inadvertently received 30 mg dose 3 months ago along with Casodex daily.  2. B/L gynecomastia with some tenderness, secondary to Depo-[name]  3. Hot flashes, secondary to depo-lupron  4. Back pain second to industrial accident, for nerve appellation.     PLAN:  #1. Depot [name] 30 mg intramuscularly next month and then every 4 months thereafter. #2. Continue Casodex 50 mg daily. #3. Patient has medication at home for hot flashes and will try it again notifying us what it is. It is poorly tolerated, Effexor will be ordered. #4. Followup with office visit in 5 months. CBC, chem profile, PSA, testosterone level at that time   All questions were answered. The patient knows to call the clinic with any problems, questions or concerns. We can certainly see the patient much sooner if necessary.   I spent 25 minutes counseling the patient face to face. The total time spent in the appointment was 30 minutes.    Farrel Gobble, MD 05/15/2014 11:27 AM  DISCLAIMER:  This note was dictated with voice recognition software.  Similar sounding words can inadvertently be transcribed inaccurately and may not be  corrected upon review.

## 2014-05-15 NOTE — Patient Instructions (Signed)
[place] Discharge Instructions  RECOMMENDATIONS MADE BY THE CONSULTANT AND ANY TEST RESULTS WILL BE SENT TO YOUR REFERRING PHYSICIAN.  EXAM FINDINGS BY THE PHYSICIAN TODAY AND SIGNS OR SYMPTOMS TO REPORT TO CLINIC OR PRIMARY PHYSICIAN: Exam and findings as discussed by Dr. Barnet Glasgow.  Call us with the name of the medication that you are taking for hot flashes and the name of the new pain medication.  If any problems with your blood work we will let you know.  Report any issues or concerns.   MEDICATIONS PRESCRIBED:  Continue Casodex  INSTRUCTIONS/FOLLOW-UP: Your injection will be due next month then every 4 months thereafter. Labs monthly and office visit in 5 months.  Thank you for choosing [facility] to provide your oncology and hematology care.  To afford each patient quality time with our providers, please arrive at least 15 minutes before your scheduled appointment time.  With your help, our goal is to use those 15 minutes to complete the necessary work-up to ensure our physicians have the information they need to help with your evaluation and healthcare recommendations.    Effective January 1st, 2014, we ask that you re-schedule your appointment with our physicians should you arrive 10 or more minutes late for your appointment.  We strive to give you quality time with our providers, and arriving late affects you and other patients whose appointments are after yours.    Again, thank you for choosing [facility].  Our hope is that these requests will decrease the amount of time that you wait before being seen by our physicians.       _____________________________________________________________  Should you have questions after your visit to [facility], please contact our office at [phone] between the hours of 8:30 a.m. and 5:00 p.m.  Voicemails left after 4:30 p.m. will not be returned until the following business day.   For prescription refill requests, have your pharmacy contact our office with your prescription refill request.

## 2014-05-16 LAB — PSA: PSA: 0.18 ng/mL (ref <=4.00)

## 2014-05-19 ENCOUNTER — Telehealth (HOSPITAL_COMMUNITY): Payer: Self-pay

## 2014-05-19 ENCOUNTER — Encounter (HOSPITAL_COMMUNITY): Payer: Self-pay

## 2014-05-19 NOTE — Telephone Encounter (Signed)
Message left for patient to re-try the sertraline for his hot flashes and if not effective to contact office and another medication will be prescribed.

## 2014-05-19 NOTE — Telephone Encounter (Signed)
Call from patient, medication that he was given for hot flashes is Sertraline 25 mg and medication for pain is tramadol tid.

## 2014-06-06 ENCOUNTER — Other Ambulatory Visit (HOSPITAL_COMMUNITY): Payer: Self-pay

## 2014-06-06 DIAGNOSIS — C61 Malignant neoplasm of prostate: Secondary | ICD-10-CM

## 2014-06-09 ENCOUNTER — Encounter (HOSPITAL_BASED_OUTPATIENT_CLINIC_OR_DEPARTMENT_OTHER): Payer: PRIVATE HEALTH INSURANCE

## 2014-06-09 DIAGNOSIS — C61 Malignant neoplasm of prostate: Secondary | ICD-10-CM

## 2014-06-09 NOTE — Progress Notes (Signed)
LABS DRAWN FOR PSA

## 2014-06-10 LAB — PSA: PSA: 0.17 ng/mL (ref <=4.00)

## 2014-06-11 ENCOUNTER — Encounter (HOSPITAL_COMMUNITY): Payer: PRIVATE HEALTH INSURANCE | Attending: Oncology

## 2014-06-11 VITALS — BP 138/72 | HR 105 | Temp 97.9°F | Resp 18

## 2014-06-11 DIAGNOSIS — Z5111 Encounter for antineoplastic chemotherapy: Secondary | ICD-10-CM

## 2014-06-11 DIAGNOSIS — C61 Malignant neoplasm of prostate: Secondary | ICD-10-CM | POA: Insufficient documentation

## 2014-06-11 MED ORDER — LEUPROLIDE ACETATE (4 MONTH) 30 MG IM KIT
30.0000 mg | PACK | Freq: Once | INTRAMUSCULAR | Status: AC
  Administered 2014-06-11: 30 mg via INTRAMUSCULAR
  Filled 2014-06-11: qty 30

## 2014-06-11 MED ORDER — LEUPROLIDE ACETATE (4 MONTH) 30 MG IM KIT: 30.0000 mg | PACK | Freq: Once | INTRAMUSCULAR | Status: DC

## 2014-06-11 NOTE — Progress Notes (Signed)
Walter Boone presents today for injection per MD orders. [name] 30mg  administered IM in left dorsogluteal site. Administration without incident. Patient tolerated well.

## 2014-06-12 ENCOUNTER — Ambulatory Visit (HOSPITAL_COMMUNITY): Payer: PRIVATE HEALTH INSURANCE

## 2014-07-07 ENCOUNTER — Encounter (HOSPITAL_BASED_OUTPATIENT_CLINIC_OR_DEPARTMENT_OTHER): Payer: PRIVATE HEALTH INSURANCE

## 2014-07-07 DIAGNOSIS — C61 Malignant neoplasm of prostate: Secondary | ICD-10-CM

## 2014-07-07 NOTE — Progress Notes (Signed)
LABS DRAWN FOR PSA

## 2014-07-08 LAB — PSA: PSA: 0.19 ng/mL (ref <=4.00)

## 2014-07-10 ENCOUNTER — Other Ambulatory Visit (HOSPITAL_COMMUNITY): Payer: PRIVATE HEALTH INSURANCE

## 2014-08-04 ENCOUNTER — Other Ambulatory Visit (HOSPITAL_COMMUNITY): Payer: Self-pay | Admitting: Oncology

## 2014-08-04 ENCOUNTER — Encounter (HOSPITAL_COMMUNITY): Payer: PRIVATE HEALTH INSURANCE | Attending: Oncology

## 2014-08-04 DIAGNOSIS — E876 Hypokalemia: Secondary | ICD-10-CM

## 2014-08-04 DIAGNOSIS — C61 Malignant neoplasm of prostate: Secondary | ICD-10-CM | POA: Diagnosis present

## 2014-08-04 LAB — COMPREHENSIVE METABOLIC PANEL
ALBUMIN: 4.3 g/dL (ref 3.5–5.2)
ALT: 18 U/L (ref 0–53)
ANION GAP: 14 (ref 5–15)
AST: 20 U/L (ref 0–37)
Alkaline Phosphatase: 64 U/L (ref 39–117)
BUN: 25 mg/dL — AB (ref 6–23)
CALCIUM: 9.9 mg/dL (ref 8.4–10.5)
CO2: 26 mEq/L (ref 19–32)
CREATININE: 0.93 mg/dL (ref 0.50–1.35)
Chloride: 102 mEq/L (ref 96–112)
GFR calc Af Amer: >90 mL/min (ref >90)
GFR calc non Af Amer: 89 mL/min — ABNORMAL LOW (ref >90)
Glucose, Bld: 101 mg/dL — ABNORMAL HIGH (ref 70–99)
Potassium: 3.6 mEq/L — ABNORMAL LOW (ref 3.7–5.3)
Sodium: 142 mEq/L (ref 137–147)
TOTAL PROTEIN: 7.7 g/dL (ref 6.0–8.3)
Total Bilirubin: 0.3 mg/dL (ref 0.3–1.2)

## 2014-08-04 LAB — CBC WITH DIFFERENTIAL/PLATELET
Basophils Absolute: 0 10*3/uL (ref 0.0–0.1)
Basophils Relative: 1 % (ref 0–1)
Eosinophils Absolute: 0.3 10*3/uL (ref 0.0–0.7)
Eosinophils Relative: 4 % (ref 0–5)
HEMATOCRIT: 37 % — AB (ref 39.0–52.0)
HEMOGLOBIN: 13.1 g/dL (ref 13.0–17.0)
Lymphocytes Relative: 25 % (ref 12–46)
Lymphs Abs: 2.2 10*3/uL (ref 0.7–4.0)
MCH: 32.3 pg (ref 26.0–34.0)
MCHC: 35.4 g/dL (ref 30.0–36.0)
MCV: 91.4 fL (ref 78.0–100.0)
MONO ABS: 0.5 10*3/uL (ref 0.1–1.0)
Monocytes Relative: 5 % (ref 3–12)
Neutro Abs: 5.8 10*3/uL (ref 1.7–7.7)
Neutrophils Relative %: 65 % (ref 43–77)
Platelets: 349 10*3/uL (ref 150–400)
RBC: 4.05 MIL/uL — AB (ref 4.22–5.81)
RDW: 12.4 % (ref 11.5–15.5)
WBC: 8.8 10*3/uL (ref 4.0–10.5)

## 2014-08-04 MED ORDER — POTASSIUM CHLORIDE CRYS ER 20 MEQ PO TBCR: 20.0000 meq | EXTENDED_RELEASE_TABLET | Freq: Two times a day (BID) | ORAL | Status: DC

## 2014-08-04 NOTE — Progress Notes (Signed)
Labs for cbcd,cmp,psa

## 2014-08-05 LAB — PSA: PSA: 0.18 ng/mL (ref <=4.00)

## 2014-08-07 ENCOUNTER — Other Ambulatory Visit (HOSPITAL_COMMUNITY): Payer: PRIVATE HEALTH INSURANCE

## 2014-09-01 ENCOUNTER — Encounter (HOSPITAL_COMMUNITY): Payer: PRIVATE HEALTH INSURANCE | Attending: Oncology

## 2014-09-01 DIAGNOSIS — C61 Malignant neoplasm of prostate: Secondary | ICD-10-CM | POA: Diagnosis present

## 2014-09-01 NOTE — Progress Notes (Signed)
LABS FOR PSA

## 2014-09-02 LAB — PSA: PSA: 0.19 ng/mL (ref <=4.00)

## 2014-09-04 ENCOUNTER — Other Ambulatory Visit (HOSPITAL_COMMUNITY): Payer: PRIVATE HEALTH INSURANCE

## 2014-09-29 ENCOUNTER — Encounter (HOSPITAL_COMMUNITY): Payer: PRIVATE HEALTH INSURANCE | Attending: Oncology

## 2014-09-29 DIAGNOSIS — C61 Malignant neoplasm of prostate: Secondary | ICD-10-CM | POA: Diagnosis present

## 2014-09-29 LAB — CBC WITH DIFFERENTIAL/PLATELET
BASOS PCT: 0 % (ref 0–1)
Basophils Absolute: 0 10*3/uL (ref 0.0–0.1)
EOS PCT: 3 % (ref 0–5)
Eosinophils Absolute: 0.4 10*3/uL (ref 0.0–0.7)
HCT: 39.9 % (ref 39.0–52.0)
Hemoglobin: 13.9 g/dL (ref 13.0–17.0)
LYMPHS ABS: 3 10*3/uL (ref 0.7–4.0)
Lymphocytes Relative: 26 % (ref 12–46)
MCH: 31.7 pg (ref 26.0–34.0)
MCHC: 34.8 g/dL (ref 30.0–36.0)
MCV: 90.9 fL (ref 78.0–100.0)
Monocytes Absolute: 0.7 10*3/uL (ref 0.1–1.0)
Monocytes Relative: 6 % (ref 3–12)
Neutro Abs: 7.5 10*3/uL (ref 1.7–7.7)
Neutrophils Relative %: 65 % (ref 43–77)
Platelets: 405 10*3/uL — ABNORMAL HIGH (ref 150–400)
RBC: 4.39 MIL/uL (ref 4.22–5.81)
RDW: 12.7 % (ref 11.5–15.5)
WBC: 11.6 10*3/uL — ABNORMAL HIGH (ref 4.0–10.5)

## 2014-09-29 LAB — COMPREHENSIVE METABOLIC PANEL
ALBUMIN: 3.9 g/dL (ref 3.5–5.2)
ALT: 22 U/L (ref 0–53)
ANION GAP: 14 (ref 5–15)
AST: 16 U/L (ref 0–37)
Alkaline Phosphatase: 66 U/L (ref 39–117)
BUN: 27 mg/dL — AB (ref 6–23)
CO2: 24 mEq/L (ref 19–32)
Calcium: 9.4 mg/dL (ref 8.4–10.5)
Chloride: 102 mEq/L (ref 96–112)
Creatinine, Ser: 0.88 mg/dL (ref 0.50–1.35)
GFR calc non Af Amer: >90 mL/min (ref >90)
GLUCOSE: 108 mg/dL — AB (ref 70–99)
Potassium: 3.9 mEq/L (ref 3.7–5.3)
Sodium: 140 mEq/L (ref 137–147)
TOTAL PROTEIN: 7.5 g/dL (ref 6.0–8.3)
Total Bilirubin: 0.4 mg/dL (ref 0.3–1.2)

## 2014-09-29 LAB — TESTOSTERONE: TESTOSTERONE: 24.24 ng/dL — AB (ref 300–890)

## 2014-09-29 NOTE — Progress Notes (Signed)
Walter Bogus, MD [address] [place] [address]  Prostate cancer - Plan: SCHEDULING COMMUNICATION INJECTION, leuprolide ([name]) injection 30 mg  CURRENT THERAPY: Casodex 50 mg daily along with [name], last dose on 06/11/2014 given at 30 mg.  INTERVAL HISTORY: Walter Boone 63 y.o. male returns for  regular  visit for followup of recurrent prostate cancer having received Depo-[name] 30 mg 3 months ago and while taking Casodex 50 mg daily.   I personally reviewed and went over laboratory results with the patient.  The results are noted within this dictation.  He reports a 3 week history of being off Casodex for some sort of prescription error.  I received a refill request from his pharmacy yesterday and that was filled promptly.  I am not sure I can figure out the mix-up in that regard.  His PSA is up and that may very well be secondary to being off Casodex.  Therefore, we will recheck in 1 month time.  He recently had an ablation for his work-related back pain in [place].  His pain persists.  His other issue, which has been ongoing for some time is insomnia.  He has difficulty sleeping and "something needs to be done with that."  He has tried OTC melatonin.  I have recommended Benadryl 25-50 mg at HS.  If ineffective, he will call us and I will try something else.  Oncologically, he denies any complaints and ROS questioning is negative including urinary complaints.    Past Medical History  Diagnosis Date  . Prostate cancer   . Fatty liver   . Hypertension   . Hyperlipidemia   . Hemorrhoids     history  . Prostate cancer 06/20/2011  . Prostate carcinoma, recurrent 08/10/2012  . Gynecomastia, male 01/11/2013    Secondary to prostate ca Tx.   Marland Kitchen History of back injury 11/19/2013    has H N P-CERVICAL; CERVICAL SPASM; Prostate cancer; Gynecomastia, male; Chest pain; Hypertension; Hyperlipidemia; and Chest pain at rest on his problem list.     is  allergic to tylenol.  Mr. Thune does not currently have medications on file.  Past Surgical History  Procedure Laterality Date  . Prostate biopsy  11/06  . Esophagogastroduodenoscopy    . Knee surgery      left  . Ankle surgery  1987    left  . Hernia repair  2010  . Colonoscopy  2008    Denies any headaches, dizziness, double vision, fevers, chills, nausea, vomiting, diarrhea, constipation, chest pain, heart palpitations, shortness of breath, blood in stool, black tarry stool, urinary pain, urinary burning, urinary frequency, hematuria.   PHYSICAL EXAMINATION  ECOG PERFORMANCE STATUS: 1 - Symptomatic but completely ambulatory  Filed Vitals:   10/01/14 1441  BP: 120/79  Pulse: 88  Temp: 97.8 F (36.6 C)  Resp: 18    GENERAL:alert, well nourished, well developed, comfortable, cooperative and smiling SKIN: skin color, texture, turgor are normal, no rashes or significant lesions HEAD: Normocephalic, No masses, lesions, tenderness or abnormalities EYES: normal, PERRLA, EOMI, Conjunctiva are pink and non-injected EARS: External ears normal OROPHARYNX:lips, buccal mucosa, and tongue normal and mucous membranes are moist  NECK: supple, no adenopathy, thyroid normal size, non-tender, without nodularity, no stridor, non-tender, trachea midline LYMPH:  no palpable lymphadenopathy BREAST:not examined LUNGS: clear to auscultation  HEART: regular rate & rhythm ABDOMEN:abdomen soft and normal bowel sounds BACK: Back symmetric, no curvature. EXTREMITIES:less then 2 second capillary refill, no joint deformities,  effusion, or inflammation, no skin discoloration, no cyanosis  NEURO: alert & oriented x 3 with fluent speech, no focal motor/sensory deficits, gait normal   LABORATORY DATA: CBC    Component Value Date/Time   WBC 11.6* 09/29/2014 0941   RBC 4.39 09/29/2014 0941   HGB 13.9 09/29/2014 0941   HCT 39.9 09/29/2014 0941   PLT 405* 09/29/2014 0941   MCV 90.9 09/29/2014  0941   MCH 31.7 09/29/2014 0941   MCHC 34.8 09/29/2014 0941   RDW 12.7 09/29/2014 0941   LYMPHSABS 3.0 09/29/2014 0941   MONOABS 0.7 09/29/2014 0941   EOSABS 0.4 09/29/2014 0941   BASOSABS 0.0 09/29/2014 0941    .lastcem Lab Results  Component Value Date   PSA 0.48 09/29/2014   PSA 0.19 09/01/2014   PSA 0.18 08/04/2014   Lab Results  Component Value Date   TESTOSTERONE 24.24* 09/29/2014      ASSESSMENT:  1. Recurrent prostate cancer. Now on Depo-[name] every 4 months since he inadvertently received 30 mg dose 3 months ago along with Casodex daily.  2. B/L gynecomastia with some tenderness, secondary to Depo-[name]  3. Hot flashes, secondary to depo-lupron  4. Back pain second to industrial accident.  S/P ablation. 5. Insomnia  Patient Active Problem List   Diagnosis Date Noted  . Chest pain 04/02/2014  . Chest pain at rest 04/02/2014  . Hypertension   . Hyperlipidemia   . Gynecomastia, male 01/11/2013  . Prostate cancer 06/20/2011  . H N P-CERVICAL 10/05/2009  . CERVICAL SPASM 10/05/2009    PLAN:  1. I personally reviewed and went over laboratory results with the patient.  The results are noted within this dictation. 2. Depo-[name] 30 mg intramuscularly today and then every 4 months.  Supportive therapy plan updated accordingly.  3. Continue Casodex 50 mg daily. 4. Recommend benadryl 25-50 mg at HS for insomnia.  If ineffective, he will call the clinic and we can try restoril or ambien. 5. Labs every 4 weeks: PSA 6. Labs in 4 months: CBC diff, CMET, PSA 7. Return in 4 months for follow-up   THERAPY PLAN:  His PSA recently increased and that could be secondary to not taking Casodex x 3 weeks.  Therefore, he will restart Casodex and we will re-evaluate PSA in 4 weeks.  If higher, will perform scans.  All questions were answered. The patient knows to call the clinic with any problems, questions or concerns. We can certainly see the patient much sooner if  necessary.  Patient and plan discussed with Dr. Farrel Gobble and he is in agreement with the aforementioned.   KEFALAS,THOMAS 10/01/2014

## 2014-09-29 NOTE — Progress Notes (Signed)
LABS FOR CBCD,CMP,TEST,PSA

## 2014-09-30 ENCOUNTER — Other Ambulatory Visit (HOSPITAL_COMMUNITY): Payer: Self-pay | Admitting: Oncology

## 2014-09-30 ENCOUNTER — Telehealth (HOSPITAL_COMMUNITY): Payer: Self-pay | Admitting: *Deleted

## 2014-09-30 DIAGNOSIS — C61 Malignant neoplasm of prostate: Secondary | ICD-10-CM

## 2014-09-30 LAB — PSA: PSA: 0.48 ng/mL (ref <=4.00)

## 2014-09-30 MED ORDER — BICALUTAMIDE 50 MG PO TABS: 50.0000 mg | ORAL_TABLET | Freq: Every day | ORAL | Status: DC

## 2014-09-30 NOTE — Telephone Encounter (Signed)
Patient needs refill on casodex. He states he has been out for almost 3 weeks. He uses [facility].

## 2014-10-01 ENCOUNTER — Encounter (HOSPITAL_BASED_OUTPATIENT_CLINIC_OR_DEPARTMENT_OTHER): Payer: PRIVATE HEALTH INSURANCE | Admitting: Oncology

## 2014-10-01 ENCOUNTER — Encounter (HOSPITAL_COMMUNITY): Payer: PRIVATE HEALTH INSURANCE

## 2014-10-01 ENCOUNTER — Ambulatory Visit (HOSPITAL_COMMUNITY): Payer: PRIVATE HEALTH INSURANCE | Admitting: Oncology

## 2014-10-01 VITALS — BP 120/79 | HR 88 | Temp 97.8°F | Resp 18 | Wt 212.6 lb

## 2014-10-01 DIAGNOSIS — G4700 Insomnia, unspecified: Secondary | ICD-10-CM

## 2014-10-01 DIAGNOSIS — Z5111 Encounter for antineoplastic chemotherapy: Secondary | ICD-10-CM

## 2014-10-01 DIAGNOSIS — C61 Malignant neoplasm of prostate: Secondary | ICD-10-CM

## 2014-10-01 MED ORDER — LEUPROLIDE ACETATE (4 MONTH) 30 MG IM KIT
30.0000 mg | PACK | Freq: Once | INTRAMUSCULAR | Status: AC
  Administered 2014-10-01: 30 mg via INTRAMUSCULAR
  Filled 2014-10-01: qty 30

## 2014-10-01 NOTE — Progress Notes (Signed)
Walter Boone presents today for injection per MD orders. lupron 30 mg administered IM in left Gluteal. Administration without incident. Patient tolerated well.

## 2014-10-01 NOTE — Patient Instructions (Signed)
..  [facility] Discharge Instructions  RECOMMENDATIONS MADE BY THE CONSULTANT AND ANY TEST RESULTS WILL BE SENT TO YOUR REFERRING PHYSICIAN.  EXAM FINDINGS BY THE PHYSICIAN TODAY AND SIGNS OR SYMPTOMS TO REPORT TO CLINIC OR PRIMARY PHYSICIAN: Exam and findings as discussed by T. Sheldon Silvan.  MEDICATIONS PRESCRIBED:  Depo [name] 40 mg today and in 4 months Benadryl take 1-2 tablets as needed for sleep INSTRUCTIONS/FOLLOW-UP: Continue casodex Labs monthly Return to see MD in 4 months  Thank you for choosing [facility] to provide your oncology and hematology care.  To afford each patient quality time with our providers, please arrive at least 15 minutes before your scheduled appointment time.  With your help, our goal is to use those 15 minutes to complete the necessary work-up to ensure our physicians have the information they need to help with your evaluation and healthcare recommendations.    Effective January 1st, 2014, we ask that you re-schedule your appointment with our physicians should you arrive 10 or more minutes late for your appointment.  We strive to give you quality time with our providers, and arriving late affects you and other patients whose appointments are after yours.    Again, thank you for choosing [facility].  Our hope is that these requests will decrease the amount of time that you wait before being seen by our physicians.       _____________________________________________________________  Should you have questions after your visit to [facility], please contact our office at [phone] between the hours of 8:30 a.m. and 4:30 p.m.  Voicemails left after 4:30 p.m. will not be returned until the following business day.  For prescription refill requests, have your pharmacy contact our office with your prescription refill request.    _______________________________________________________________  We hope that  we have given you very good care.  You may receive a patient satisfaction survey in the mail, please complete it and return it as soon as possible.  We value your feedback!  _______________________________________________________________  Have you asked about our STAR program?  STAR stands for Survivorship Training and Rehabilitation, and this is a nationally recognized cancer care program that focuses on survivorship and rehabilitation.  Cancer and cancer treatments may cause problems, such as, pain, making you feel tired and keeping you from doing the things that you need or want to do. Cancer rehabilitation can help. Our goal is to reduce these troubling effects and help you have the best quality of life possible.  You may receive a survey from a nurse that asks questions about your current state of health.  Based on the survey results, all eligible patients will be referred to the [facility] program for an evaluation so we can better serve you!  A frequently asked questions sheet is available upon request.

## 2014-10-02 ENCOUNTER — Other Ambulatory Visit (HOSPITAL_COMMUNITY): Payer: PRIVATE HEALTH INSURANCE

## 2014-10-27 ENCOUNTER — Other Ambulatory Visit (HOSPITAL_COMMUNITY): Payer: PRIVATE HEALTH INSURANCE

## 2014-10-27 ENCOUNTER — Ambulatory Visit (HOSPITAL_COMMUNITY): Payer: PRIVATE HEALTH INSURANCE

## 2014-10-29 ENCOUNTER — Encounter (HOSPITAL_COMMUNITY): Payer: PRIVATE HEALTH INSURANCE | Attending: Oncology

## 2014-10-29 ENCOUNTER — Encounter (HOSPITAL_BASED_OUTPATIENT_CLINIC_OR_DEPARTMENT_OTHER): Payer: PRIVATE HEALTH INSURANCE

## 2014-10-29 ENCOUNTER — Ambulatory Visit (HOSPITAL_COMMUNITY): Payer: PRIVATE HEALTH INSURANCE

## 2014-10-29 ENCOUNTER — Encounter (HOSPITAL_COMMUNITY): Payer: Self-pay

## 2014-10-29 VITALS — BP 144/88 | HR 81 | Temp 98.0°F | Resp 18

## 2014-10-29 DIAGNOSIS — Z23 Encounter for immunization: Secondary | ICD-10-CM

## 2014-10-29 DIAGNOSIS — C61 Malignant neoplasm of prostate: Secondary | ICD-10-CM

## 2014-10-29 LAB — COMPREHENSIVE METABOLIC PANEL
ALT: 17 U/L (ref 0–53)
AST: 19 U/L (ref 0–37)
Albumin: 4 g/dL (ref 3.5–5.2)
Alkaline Phosphatase: 67 U/L (ref 39–117)
Anion gap: 15 (ref 5–15)
BUN: 19 mg/dL (ref 6–23)
CO2: 25 meq/L (ref 19–32)
Calcium: 9.5 mg/dL (ref 8.4–10.5)
Chloride: 99 mEq/L (ref 96–112)
Creatinine, Ser: 0.95 mg/dL (ref 0.50–1.35)
GFR, EST NON AFRICAN AMERICAN: 88 mL/min — AB (ref >90)
GLUCOSE: 122 mg/dL — AB (ref 70–99)
POTASSIUM: 3.7 meq/L (ref 3.7–5.3)
SODIUM: 139 meq/L (ref 137–147)
Total Bilirubin: 0.5 mg/dL (ref 0.3–1.2)
Total Protein: 7.3 g/dL (ref 6.0–8.3)

## 2014-10-29 LAB — CBC WITH DIFFERENTIAL/PLATELET
Basophils Absolute: 0 10*3/uL (ref 0.0–0.1)
Basophils Relative: 0 % (ref 0–1)
Eosinophils Absolute: 0.4 10*3/uL (ref 0.0–0.7)
Eosinophils Relative: 5 % (ref 0–5)
HCT: 38.8 % — ABNORMAL LOW (ref 39.0–52.0)
Hemoglobin: 13.6 g/dL (ref 13.0–17.0)
LYMPHS ABS: 2.6 10*3/uL (ref 0.7–4.0)
LYMPHS PCT: 32 % (ref 12–46)
MCH: 31.9 pg (ref 26.0–34.0)
MCHC: 35.1 g/dL (ref 30.0–36.0)
MCV: 91.1 fL (ref 78.0–100.0)
Monocytes Absolute: 0.5 10*3/uL (ref 0.1–1.0)
Monocytes Relative: 7 % (ref 3–12)
NEUTROS PCT: 56 % (ref 43–77)
Neutro Abs: 4.5 10*3/uL (ref 1.7–7.7)
PLATELETS: 371 10*3/uL (ref 150–400)
RBC: 4.26 MIL/uL (ref 4.22–5.81)
RDW: 12.8 % (ref 11.5–15.5)
WBC: 8.1 10*3/uL (ref 4.0–10.5)

## 2014-10-29 MED ORDER — INFLUENZA VAC SPLIT QUAD 0.5 ML IM SUSY
PREFILLED_SYRINGE | INTRAMUSCULAR | Status: AC
  Filled 2014-10-29: qty 0.5

## 2014-10-29 MED ORDER — INFLUENZA VAC SPLIT QUAD 0.5 ML IM SUSY
0.5000 mL | PREFILLED_SYRINGE | Freq: Once | INTRAMUSCULAR | Status: AC
  Administered 2014-10-29: 0.5 mL via INTRAMUSCULAR

## 2014-10-29 NOTE — Progress Notes (Signed)
LABS FOR CMP,CBCD,PSA

## 2014-10-29 NOTE — Patient Instructions (Signed)
You had a flu shot please call the clinic if you have any questions or concerns

## 2014-10-29 NOTE — Progress Notes (Signed)
Walter Boone received a flu shot today

## 2014-10-30 LAB — PSA: PSA: 0.25 ng/mL (ref <=4.00)

## 2014-11-05 ENCOUNTER — Other Ambulatory Visit: Payer: Self-pay | Admitting: Orthopedic Surgery

## 2014-11-05 DIAGNOSIS — M533 Sacrococcygeal disorders, not elsewhere classified: Principal | ICD-10-CM

## 2014-11-05 DIAGNOSIS — G8929 Other chronic pain: Secondary | ICD-10-CM

## 2014-11-24 ENCOUNTER — Other Ambulatory Visit (HOSPITAL_COMMUNITY): Payer: PRIVATE HEALTH INSURANCE

## 2014-11-24 ENCOUNTER — Inpatient Hospital Stay: Admission: RE | Admit: 2014-11-24 | Payer: Worker's Compensation | Source: Ambulatory Visit

## 2014-12-22 ENCOUNTER — Other Ambulatory Visit (HOSPITAL_COMMUNITY): Payer: PRIVATE HEALTH INSURANCE

## 2014-12-23 ENCOUNTER — Other Ambulatory Visit (HOSPITAL_COMMUNITY): Payer: Self-pay | Admitting: Pulmonary Disease

## 2014-12-23 ENCOUNTER — Ambulatory Visit (HOSPITAL_COMMUNITY)
Admission: RE | Admit: 2014-12-23 | Discharge: 2014-12-23 | Disposition: A | Discharge status: Home / Self Care | Payer: 59 | Source: Ambulatory Visit | Attending: Pulmonary Disease | Admitting: Pulmonary Disease

## 2014-12-23 DIAGNOSIS — M25511 Pain in right shoulder: Secondary | ICD-10-CM | POA: Insufficient documentation

## 2014-12-23 DIAGNOSIS — R52 Pain, unspecified: Secondary | ICD-10-CM

## 2014-12-23 DIAGNOSIS — M25521 Pain in right elbow: Secondary | ICD-10-CM | POA: Insufficient documentation

## 2015-01-12 ENCOUNTER — Encounter (HOSPITAL_COMMUNITY): Payer: 59 | Attending: Oncology

## 2015-01-12 DIAGNOSIS — C61 Malignant neoplasm of prostate: Secondary | ICD-10-CM | POA: Insufficient documentation

## 2015-01-12 LAB — COMPREHENSIVE METABOLIC PANEL
ALK PHOS: 52 U/L (ref 39–117)
ALT: 18 U/L (ref 0–53)
AST: 17 U/L (ref 0–37)
Albumin: 4.3 g/dL (ref 3.5–5.2)
Anion gap: 8 (ref 5–15)
BILIRUBIN TOTAL: 0.7 mg/dL (ref 0.3–1.2)
BUN: 32 mg/dL — ABNORMAL HIGH (ref 6–23)
CALCIUM: 9.5 mg/dL (ref 8.4–10.5)
CHLORIDE: 106 mmol/L (ref 96–112)
CO2: 25 mmol/L (ref 19–32)
Creatinine, Ser: 0.92 mg/dL (ref 0.50–1.35)
GFR calc Af Amer: >90 mL/min (ref >90)
GFR calc non Af Amer: 89 mL/min — ABNORMAL LOW (ref >90)
Glucose, Bld: 96 mg/dL (ref 70–99)
POTASSIUM: 3.5 mmol/L (ref 3.5–5.1)
Sodium: 139 mmol/L (ref 135–145)
TOTAL PROTEIN: 7.4 g/dL (ref 6.0–8.3)

## 2015-01-12 LAB — CBC WITH DIFFERENTIAL/PLATELET
BASOS ABS: 0 10*3/uL (ref 0.0–0.1)
BASOS PCT: 0 % (ref 0–1)
EOS PCT: 1 % (ref 0–5)
Eosinophils Absolute: 0.2 10*3/uL (ref 0.0–0.7)
HCT: 40.3 % (ref 39.0–52.0)
Hemoglobin: 13.7 g/dL (ref 13.0–17.0)
LYMPHS PCT: 26 % (ref 12–46)
Lymphs Abs: 3.5 10*3/uL (ref 0.7–4.0)
MCH: 31.4 pg (ref 26.0–34.0)
MCHC: 34 g/dL (ref 30.0–36.0)
MCV: 92.2 fL (ref 78.0–100.0)
MONO ABS: 0.8 10*3/uL (ref 0.1–1.0)
MONOS PCT: 6 % (ref 3–12)
NEUTROS ABS: 8.8 10*3/uL — AB (ref 1.7–7.7)
Neutrophils Relative %: 67 % (ref 43–77)
PLATELETS: 344 10*3/uL (ref 150–400)
RBC: 4.37 MIL/uL (ref 4.22–5.81)
RDW: 12.5 % (ref 11.5–15.5)
WBC: 13.2 10*3/uL — AB (ref 4.0–10.5)

## 2015-01-12 NOTE — Addendum Note (Signed)
Addended by: Jerald Kief on: 01/12/2015 12:52 PM   Modules accepted: Orders

## 2015-01-12 NOTE — Progress Notes (Signed)
Labs for cbcd,psa,cmp

## 2015-01-13 LAB — PSA: PSA: 0.4 ng/mL (ref <=4.00)

## 2015-01-19 ENCOUNTER — Other Ambulatory Visit (HOSPITAL_COMMUNITY): Payer: PRIVATE HEALTH INSURANCE

## 2015-01-21 ENCOUNTER — Other Ambulatory Visit (HOSPITAL_COMMUNITY): Payer: Self-pay | Admitting: Pulmonary Disease

## 2015-01-21 ENCOUNTER — Encounter (HOSPITAL_BASED_OUTPATIENT_CLINIC_OR_DEPARTMENT_OTHER): Payer: 59 | Admitting: Hematology & Oncology

## 2015-01-21 ENCOUNTER — Ambulatory Visit (HOSPITAL_COMMUNITY)
Admission: RE | Admit: 2015-01-21 | Discharge: 2015-01-21 | Disposition: A | Discharge status: Home / Self Care | Payer: 59 | Source: Ambulatory Visit | Attending: Pulmonary Disease | Admitting: Pulmonary Disease

## 2015-01-21 ENCOUNTER — Encounter (HOSPITAL_COMMUNITY): Payer: 59

## 2015-01-21 VITALS — BP 149/104 | HR 90 | Resp 20 | Wt 222.1 lb

## 2015-01-21 DIAGNOSIS — M2550 Pain in unspecified joint: Secondary | ICD-10-CM

## 2015-01-21 DIAGNOSIS — Z8546 Personal history of malignant neoplasm of prostate: Secondary | ICD-10-CM | POA: Diagnosis not present

## 2015-01-21 DIAGNOSIS — M546 Pain in thoracic spine: Secondary | ICD-10-CM | POA: Diagnosis not present

## 2015-01-21 DIAGNOSIS — M8580 Other specified disorders of bone density and structure, unspecified site: Secondary | ICD-10-CM

## 2015-01-21 DIAGNOSIS — M549 Dorsalgia, unspecified: Secondary | ICD-10-CM

## 2015-01-21 DIAGNOSIS — Z5111 Encounter for antineoplastic chemotherapy: Secondary | ICD-10-CM

## 2015-01-21 DIAGNOSIS — C61 Malignant neoplasm of prostate: Secondary | ICD-10-CM

## 2015-01-21 DIAGNOSIS — M545 Low back pain: Secondary | ICD-10-CM

## 2015-01-21 MED ORDER — LEUPROLIDE ACETATE (4 MONTH) 30 MG IM KIT
30.0000 mg | PACK | Freq: Once | INTRAMUSCULAR | Status: AC
  Administered 2015-01-21: 30 mg via INTRAMUSCULAR
  Filled 2015-01-21: qty 30

## 2015-01-21 NOTE — Progress Notes (Signed)
Walter Boone presents today for injection per MD orders. [name] 30 mg administered IM in right upper outer Gluteal. Administration without incident. Patient tolerated well.

## 2015-01-21 NOTE — Patient Instructions (Signed)
[facility] at [facility] Discharge Instructions  RECOMMENDATIONS MADE BY THE CONSULTANT AND ANY TEST RESULTS WILL BE SENT TO YOUR REFERRING PHYSICIAN.  [name] injection today. Continue lab work and [name] injection every 4 months. MD appointment again in 4 months. We have scheduled you for a bone scan. Report any issues/concerns to office as needed prior to appointments.  Thank you for choosing [facility] at [facility] to provide your oncology and hematology care.  To afford each patient quality time with our provider, please arrive at least 15 minutes before your scheduled appointment time.    You need to re-schedule your appointment should you arrive 10 or more minutes late.  We strive to give you quality time with our providers, and arriving late affects you and other patients whose appointments are after yours.  Also, if you no show three or more times for appointments you may be dismissed from the clinic at the providers discretion.     Again, thank you for choosing [facility].  Our hope is that these requests will decrease the amount of time that you wait before being seen by our physicians.       _____________________________________________________________  Should you have questions after your visit to [facility], please contact our office at [phone] between the hours of 8:30 a.m. and 4:30 p.m.  Voicemails left after 4:30 p.m. will not be returned until the following business day.  For prescription refill requests, have your pharmacy contact our office.

## 2015-01-21 NOTE — Progress Notes (Signed)
Walter Bogus, MD [address]  [place] [address]    DIAGNOSIS: Prostate cancer   Staging form: Prostate, AJCC 7th Edition     Clinical: Stage IIC (pT2c, N0, M0) - Signed by Baird Cancer, PA on 06/20/2011  CURRENT THERAPY:[name] and Casodex       Osteopenia on bone density on 02/2014      Last imaging studies including bone scan, CT chest      abdomen and pelvis performed in August 2013 and                                             negative.        INTERVAL HISTORY: Walter Boone 64 y.o. male returns for follow-up of adenocarcinoma of the prostate. The patient he was treated with definitive radiation therapy several years ago. He then developed a climb in his PSA. He was started on [name] in 2012. Casodex was added in October 2015 when his PSA climbed from 0.19 to 0.48 pg/ml. He has had problems with hot flashes and bilateral gynecomastia. He also has had problems with insomnia since starting therapy.   He complains today of upper back pain, describes it as being worse with movement and palpation. It is between the shoulder blade and vertebral bodies. He has a long-standing history of degenerative disc disease and "joint disease" of the neck. Dr. Luan Pulling has ordered x-rays.  MEDICAL HISTORY: Past Medical History  Diagnosis Date  . Prostate cancer   . Fatty liver   . Hypertension   . Hyperlipidemia   . Hemorrhoids     history  . Prostate cancer 06/20/2011  . Prostate carcinoma, recurrent 08/10/2012  . Gynecomastia, male 01/11/2013    Secondary to prostate ca Tx.   Marland Kitchen History of back injury 11/19/2013    has H N P-CERVICAL; CERVICAL SPASM; Prostate cancer; Gynecomastia, male; Chest pain; Hypertension; Hyperlipidemia; Chest pain at rest; and Osteopenia determined by x-ray on his problem list.     is allergic to tylenol.  We administered leuprolide.  SURGICAL HISTORY: Past Surgical History  Procedure Laterality Date  . Prostate biopsy  11/06    . Esophagogastroduodenoscopy    . Knee surgery      left  . Ankle surgery  1987    left  . Hernia repair  2010  . Colonoscopy  2008    SOCIAL HISTORY: History   Social History  . Marital Status: Married    Spouse Name: N/A  . Number of Children: N/A  . Years of Education: N/A   Occupational History  . Not on file.   Social History Main Topics  . Smoking status: Former Smoker -- 2.50 packs/day for 3 years  . Smokeless tobacco: Never Used  . Alcohol Use: No     Comment: former drinker 20 years ago  . Drug Use: No  . Sexual Activity: Not on file   Other Topics Concern  . Not on file   Social History Narrative    FAMILY HISTORY: Family History  Problem Relation Age of Onset  . Diabetes Father   . Cancer Sister   . Diabetes Brother   . Heart failure Mother     Review of Systems  Constitutional: Positive for malaise/fatigue.  Eyes: Negative.   Cardiovascular: Negative.   Gastrointestinal: Negative.   Genitourinary: Negative.  Musculoskeletal: Positive for back pain, joint pain and neck pain.  Skin: Negative.   Neurological: Positive for weakness. Negative for dizziness, tingling, tremors, sensory change and speech change.  Endo/Heme/Allergies: Negative.   Psychiatric/Behavioral: Negative.     PHYSICAL EXAMINATION  ECOG PERFORMANCE STATUS: 1 - Symptomatic but completely ambulatory  Filed Vitals:   01/21/15 1506  BP: 149/104  Pulse: 90  Resp: 20    Physical Exam  Constitutional: He is oriented to person, place, and time and well-developed, well-nourished, and in no distress.  HENT:  Head: Normocephalic and atraumatic.  Nose: Nose normal.  Mouth/Throat: Oropharynx is clear and moist. No oropharyngeal exudate.  Eyes: Conjunctivae and EOM are normal. Pupils are equal, round, and reactive to light. Right eye exhibits no discharge. Left eye exhibits no discharge. No scleral icterus.  Neck: Normal range of motion. Neck supple. No tracheal deviation  present. No thyromegaly present.  Cardiovascular: Normal rate, regular rhythm and normal heart sounds.  Exam reveals no gallop and no friction rub.   No murmur heard. Pulmonary/Chest: Effort normal and breath sounds normal. He has no wheezes. He has no rales.  Abdominal: Soft. Bowel sounds are normal. He exhibits no distension and no mass. There is no tenderness. There is no rebound and no guarding.  Musculoskeletal: Normal range of motion. He exhibits no edema.  Lymphadenopathy:    He has no cervical adenopathy.  Neurological: He is alert and oriented to person, place, and time. He has normal reflexes. No cranial nerve deficit. Gait normal. Coordination normal.  Skin: Skin is warm and dry. No rash noted.  Psychiatric: Mood, memory, affect and judgment normal.  Nursing note and vitals reviewed.   LABORATORY DATA:  CBC    Component Value Date/Time   WBC 13.2* 01/12/2015 1233   RBC 4.37 01/12/2015 1233   HGB 13.7 01/12/2015 1233   HCT 40.3 01/12/2015 1233   PLT 344 01/12/2015 1233   MCV 92.2 01/12/2015 1233   MCH 31.4 01/12/2015 1233   MCHC 34.0 01/12/2015 1233   RDW 12.5 01/12/2015 1233   LYMPHSABS 3.5 01/12/2015 1233   MONOABS 0.8 01/12/2015 1233   EOSABS 0.2 01/12/2015 1233   BASOSABS 0.0 01/12/2015 1233   CMP     Component Value Date/Time   NA 139 01/12/2015 1233   K 3.5 01/12/2015 1233   CL 106 01/12/2015 1233   CO2 25 01/12/2015 1233   GLUCOSE 96 01/12/2015 1233   BUN 32* 01/12/2015 1233   CREATININE 0.92 01/12/2015 1233   CALCIUM 9.5 01/12/2015 1233   PROT 7.4 01/12/2015 1233   ALBUMIN 4.3 01/12/2015 1233   AST 17 01/12/2015 1233   ALT 18 01/12/2015 1233   ALKPHOS 52 01/12/2015 1233   BILITOT 0.7 01/12/2015 1233   GFRNONAA 89* 01/12/2015 1233   GFRAA >90 01/12/2015 1233      ASSESSMENT and THERAPY PLAN:    Prostate cancer 64 year old male with a biochemical recurrence of adenocarcinoma of the prostate. He is currently on Casodex and [name].  Available  records he underwent radiation therapy with Dr. Valere Dross in 2007. He was started on [name] therapy in 2012. It appears he also received [name] for 2 years at the time of his initial diagnosis, in 2006 in 2007. I discussed with the patient I would like to pull his paper chart for additional review of his disease including his Gleason score. I am also curious given his age why the time of his biochemical recurrence other therapies were not offered. I will  tentatively schedule him for follow-up at his next [name] injection. I advised him to call in the interim with any problems concerns and we will see him sooner.   Osteopenia determined by x-ray He has osteopenia seen on a DEXA from March 2015. We will offer the prolia. I will make sure he is on calcium and vitamin D. I also recommend checking a vitamin D level at his first prolia injection.     All questions were answered. The patient knows to call the clinic with any problems, questions or concerns. We can certainly see the patient much sooner if necessary.  Molli Hazard 02/01/2015

## 2015-01-26 ENCOUNTER — Encounter (HOSPITAL_COMMUNITY): Payer: 59

## 2015-01-28 ENCOUNTER — Ambulatory Visit (HOSPITAL_COMMUNITY): Payer: PRIVATE HEALTH INSURANCE

## 2015-02-01 ENCOUNTER — Other Ambulatory Visit (HOSPITAL_COMMUNITY): Payer: Self-pay | Admitting: Hematology & Oncology

## 2015-02-01 ENCOUNTER — Encounter (HOSPITAL_COMMUNITY): Payer: Self-pay | Admitting: Hematology & Oncology

## 2015-02-01 DIAGNOSIS — M8580 Other specified disorders of bone density and structure, unspecified site: Secondary | ICD-10-CM | POA: Insufficient documentation

## 2015-02-01 NOTE — Assessment & Plan Note (Signed)
64 year old male with a biochemical recurrence of adenocarcinoma of the prostate. He is currently on Casodex and [name].  Available records he underwent radiation therapy with Dr. Valere Dross in 2007. He was started on [name] therapy in 2012. It appears he also received [name] for 2 years at the time of his initial diagnosis, in 2006 in 2007. I discussed with the patient I would like to pull his paper chart for additional review of his disease including his Gleason score. I am also curious given his age why the time of his biochemical recurrence other therapies were not offered. I will tentatively schedule him for follow-up at his next [name] injection. I advised him to call in the interim with any problems concerns and we will see him sooner.

## 2015-02-01 NOTE — Assessment & Plan Note (Signed)
He has osteopenia seen on a DEXA from March 2015. We will offer the prolia. I will make sure he is on calcium and vitamin D. I also recommend checking a vitamin D level at his first prolia injection.

## 2015-02-06 ENCOUNTER — Other Ambulatory Visit (HOSPITAL_COMMUNITY): Payer: Self-pay | Admitting: Pulmonary Disease

## 2015-02-10 ENCOUNTER — Encounter (HOSPITAL_COMMUNITY): Payer: 59 | Attending: Hematology & Oncology | Admitting: Hematology & Oncology

## 2015-02-10 VITALS — BP 147/78 | HR 90 | Temp 98.1°F | Resp 18 | Wt 222.9 lb

## 2015-02-10 DIAGNOSIS — M546 Pain in thoracic spine: Secondary | ICD-10-CM

## 2015-02-10 DIAGNOSIS — M2550 Pain in unspecified joint: Secondary | ICD-10-CM

## 2015-02-10 DIAGNOSIS — R5383 Other fatigue: Secondary | ICD-10-CM

## 2015-02-10 DIAGNOSIS — M545 Low back pain: Secondary | ICD-10-CM

## 2015-02-10 DIAGNOSIS — C61 Malignant neoplasm of prostate: Secondary | ICD-10-CM | POA: Insufficient documentation

## 2015-02-11 ENCOUNTER — Ambulatory Visit (HOSPITAL_COMMUNITY)
Admission: RE | Admit: 2015-02-11 | Discharge: 2015-02-11 | Disposition: A | Discharge status: Home / Self Care | Payer: 59 | Source: Ambulatory Visit | Attending: Pulmonary Disease | Admitting: Pulmonary Disease

## 2015-02-11 ENCOUNTER — Encounter (HOSPITAL_COMMUNITY): Payer: Self-pay

## 2015-02-11 ENCOUNTER — Encounter (HOSPITAL_COMMUNITY)
Admission: RE | Admit: 2015-02-11 | Discharge: 2015-02-11 | Disposition: A | Discharge status: Home / Self Care | Payer: 59 | Source: Ambulatory Visit | Attending: Hematology & Oncology | Admitting: Hematology & Oncology

## 2015-02-11 ENCOUNTER — Other Ambulatory Visit (HOSPITAL_COMMUNITY): Payer: Self-pay | Admitting: Pulmonary Disease

## 2015-02-11 DIAGNOSIS — M549 Dorsalgia, unspecified: Secondary | ICD-10-CM

## 2015-02-11 DIAGNOSIS — C61 Malignant neoplasm of prostate: Secondary | ICD-10-CM | POA: Diagnosis not present

## 2015-02-11 MED ORDER — TECHNETIUM TC 99M MEDRONATE IV KIT
25.0000 | PACK | Freq: Once | INTRAVENOUS | Status: AC | PRN
  Administered 2015-02-11: 25 via INTRAVENOUS

## 2015-02-20 ENCOUNTER — Other Ambulatory Visit (HOSPITAL_COMMUNITY): Payer: Self-pay | Admitting: Hematology & Oncology

## 2015-02-20 DIAGNOSIS — C61 Malignant neoplasm of prostate: Secondary | ICD-10-CM

## 2015-02-23 ENCOUNTER — Ambulatory Visit (HOSPITAL_COMMUNITY)
Admission: RE | Admit: 2015-02-23 | Discharge: 2015-02-23 | Disposition: A | Discharge status: Home / Self Care | Payer: 59 | Source: Ambulatory Visit | Attending: Hematology & Oncology | Admitting: Hematology & Oncology

## 2015-02-23 DIAGNOSIS — C61 Malignant neoplasm of prostate: Secondary | ICD-10-CM

## 2015-02-23 DIAGNOSIS — R937 Abnormal findings on diagnostic imaging of other parts of musculoskeletal system: Secondary | ICD-10-CM | POA: Insufficient documentation

## 2015-03-01 ENCOUNTER — Encounter (HOSPITAL_COMMUNITY): Payer: Self-pay | Admitting: Hematology & Oncology

## 2015-03-01 NOTE — Progress Notes (Signed)
Walter Bogus, MD [address] [place] [address]    DIAGNOSIS: Prostate cancer   Staging form: Prostate, AJCC 7th Edition     Clinical: Stage IIC (pT2c, N0, M0) - Signed by Baird Cancer, PA on 06/20/2011  CURRENT THERAPY:[name] and Casodex       Osteopenia on bone density on 02/2014      Last imaging studies including bone scan, CT chest abdomen and pelvis performed in      August 2013 and negative.        INTERVAL HISTORY: Walter Boone 64 y.o. male returns for follow-up of adenocarcinoma of the prostate. The patient he was treated with definitive radiation therapy several years ago. He then developed a climb in his PSA. He was started on [name] in 2012. Casodex was added in October 2015 when his PSA climbed from 0.19 to 0.48 pg/ml. He has had problems with hot flashes and bilateral gynecomastia. He also has had problems with insomnia since starting therapy.   At last visit he complained of significant upper back pain. He had plain films performed that were unremarkable. He states the pain continues and is interfering with his ability to sleep. We had ordered a bone scan but he states he thinks his insurance denied it and he did not have it done. He complains of fatigue, shortness of breath with exertion and less energy.  MEDICAL HISTORY: Past Medical History  Diagnosis Date  . Prostate cancer   . Fatty liver   . Hypertension   . Hyperlipidemia   . Hemorrhoids     history  . Prostate cancer 06/20/2011  . Prostate carcinoma, recurrent 08/10/2012  . Gynecomastia, male 01/11/2013    Secondary to prostate ca Tx.   Marland Kitchen History of back injury 11/19/2013    has H N P-CERVICAL; CERVICAL SPASM; Prostate cancer; Gynecomastia, male; Chest pain; Hypertension; Hyperlipidemia; Chest pain at rest; and Osteopenia determined by x-ray on his problem list.     is allergic to tylenol.  Walter Boone had no medications administered during this visit.  SURGICAL  HISTORY: Past Surgical History  Procedure Laterality Date  . Prostate biopsy  11/06  . Esophagogastroduodenoscopy    . Knee surgery      left  . Ankle surgery  1987    left  . Hernia repair  2010  . Colonoscopy  2008    SOCIAL HISTORY: History   Social History  . Marital Status: Married    Spouse Name: N/A  . Number of Children: N/A  . Years of Education: N/A   Occupational History  . Not on file.   Social History Main Topics  . Smoking status: Former Smoker -- 2.50 packs/day for 3 years  . Smokeless tobacco: Never Used  . Alcohol Use: No     Comment: former drinker 20 years ago  . Drug Use: No  . Sexual Activity: Not on file   Other Topics Concern  . Not on file   Social History Narrative    FAMILY HISTORY: Family History  Problem Relation Age of Onset  . Diabetes Father   . Cancer Sister   . Diabetes Brother   . Heart failure Mother     Review of Systems  Constitutional: Positive for malaise/fatigue.  HENT: Negative.   Eyes: Negative.   Respiratory: Positive for shortness of breath.   Cardiovascular: Negative.   Gastrointestinal: Negative.   Genitourinary: Negative.   Musculoskeletal: Positive for back pain and  joint pain.  Skin: Negative.   Neurological: Positive for weakness. Negative for dizziness, tingling, tremors, sensory change, speech change, focal weakness, seizures and loss of consciousness.  Endo/Heme/Allergies: Negative.   Psychiatric/Behavioral: Negative.     PHYSICAL EXAMINATION  ECOG PERFORMANCE STATUS: 1 - Symptomatic but completely ambulatory  Filed Vitals:   02/10/15 0906  BP: 147/78  Pulse: 90  Temp: 98.1 F (36.7 C)  Resp: 18    Physical Exam  Constitutional: He is oriented to person, place, and time and well-developed, well-nourished, and in no distress.  HENT:  Head: Normocephalic and atraumatic.  Nose: Nose normal.  Mouth/Throat: Oropharynx is clear and moist. No oropharyngeal exudate.  Eyes: Conjunctivae and  EOM are normal. Pupils are equal, round, and reactive to light. Right eye exhibits no discharge. Left eye exhibits no discharge. No scleral icterus.  Neck: Normal range of motion. Neck supple. No tracheal deviation present. No thyromegaly present.  Cardiovascular: Normal rate, regular rhythm and normal heart sounds.  Exam reveals no gallop and no friction rub.   No murmur heard. Pulmonary/Chest: Effort normal and breath sounds normal. He has no wheezes. He has no rales.  Abdominal: Soft. Bowel sounds are normal. He exhibits no distension and no mass. There is no tenderness. There is no rebound and no guarding.  Musculoskeletal: Normal range of motion. He exhibits no edema.  Lymphadenopathy:    He has no cervical adenopathy.  Neurological: He is alert and oriented to person, place, and time. He has normal reflexes. No cranial nerve deficit. Gait normal. Coordination normal.  Skin: Skin is warm and dry. No rash noted.  Psychiatric: Mood, memory, affect and judgment normal.  Nursing note and vitals reviewed.   LABORATORY DATA:  CBC    Component Value Date/Time   WBC 13.2* 01/12/2015 1233   RBC 4.37 01/12/2015 1233   HGB 13.7 01/12/2015 1233   HCT 40.3 01/12/2015 1233   PLT 344 01/12/2015 1233   MCV 92.2 01/12/2015 1233   MCH 31.4 01/12/2015 1233   MCHC 34.0 01/12/2015 1233   RDW 12.5 01/12/2015 1233   LYMPHSABS 3.5 01/12/2015 1233   MONOABS 0.8 01/12/2015 1233   EOSABS 0.2 01/12/2015 1233   BASOSABS 0.0 01/12/2015 1233   CMP     Component Value Date/Time   NA 139 01/12/2015 1233   K 3.5 01/12/2015 1233   CL 106 01/12/2015 1233   CO2 25 01/12/2015 1233   GLUCOSE 96 01/12/2015 1233   BUN 32* 01/12/2015 1233   CREATININE 0.92 01/12/2015 1233   CALCIUM 9.5 01/12/2015 1233   PROT 7.4 01/12/2015 1233   ALBUMIN 4.3 01/12/2015 1233   AST 17 01/12/2015 1233   ALT 18 01/12/2015 1233   ALKPHOS 52 01/12/2015 1233   BILITOT 0.7 01/12/2015 1233   GFRNONAA 89* 01/12/2015 1233    GFRAA >90 01/12/2015 1233    ASSESSMENT and THERAPY PLAN:   Adenocarcinoma of prostate, biochemical recurrence  He is on [name] given every 4 months. He would like to go back to every 3 month dosing as he states he "felt better." on 3 month dosing. I advised him that that is certainly not a problem. His PSA fluctuates and will need ongoing close observation. He continues on Casodex.  His bone scan was approved and I strongly recommend proceeding with it especially given his underlying cancer diagnosis and worsening back pain and fatigue. We will keep him advised of the results when they are available. If the results of his bone scan is  normal I advised him we could consider physical therapy for his back pain.  We will tentatively see him back in 3 months with repeat laboratory studies including PSA. I will change his [name] to every 3 month dosing. He is encouraged to take calcium and vitamin D for his bone health.  All questions were answered. The patient knows to call the clinic with any problems, questions or concerns. We can certainly see the patient much sooner if necessary.  Molli Hazard 03/01/2015

## 2015-04-16 ENCOUNTER — Other Ambulatory Visit (HOSPITAL_COMMUNITY): Payer: Self-pay | Admitting: Oncology

## 2015-04-16 DIAGNOSIS — C61 Malignant neoplasm of prostate: Secondary | ICD-10-CM

## 2015-04-16 MED ORDER — BICALUTAMIDE 50 MG PO TABS: 50.0000 mg | ORAL_TABLET | Freq: Every day | ORAL | Status: DC

## 2015-04-20 ENCOUNTER — Other Ambulatory Visit (HOSPITAL_COMMUNITY): Payer: Self-pay

## 2015-04-22 ENCOUNTER — Ambulatory Visit (HOSPITAL_COMMUNITY): Payer: Self-pay | Admitting: Hematology & Oncology

## 2015-05-20 ENCOUNTER — Encounter (HOSPITAL_COMMUNITY): Payer: 59 | Attending: Hematology & Oncology

## 2015-05-20 DIAGNOSIS — C61 Malignant neoplasm of prostate: Secondary | ICD-10-CM

## 2015-05-20 LAB — COMPREHENSIVE METABOLIC PANEL
ALBUMIN: 4.2 g/dL (ref 3.5–5.0)
ALT: 21 U/L (ref 17–63)
AST: 22 U/L (ref 15–41)
Alkaline Phosphatase: 54 U/L (ref 38–126)
Anion gap: 11 (ref 5–15)
BUN: 31 mg/dL — AB (ref 6–20)
CO2: 29 mmol/L (ref 22–32)
Calcium: 9.6 mg/dL (ref 8.9–10.3)
Chloride: 99 mmol/L — ABNORMAL LOW (ref 101–111)
Creatinine, Ser: 1.02 mg/dL (ref 0.61–1.24)
GFR calc Af Amer: >60 mL/min (ref >60)
Glucose, Bld: 131 mg/dL — ABNORMAL HIGH (ref 65–99)
Potassium: 3.9 mmol/L (ref 3.5–5.1)
Sodium: 139 mmol/L (ref 135–145)
TOTAL PROTEIN: 7.4 g/dL (ref 6.5–8.1)
Total Bilirubin: 0.6 mg/dL (ref 0.3–1.2)

## 2015-05-20 LAB — CBC WITH DIFFERENTIAL/PLATELET
Basophils Absolute: 0 10*3/uL (ref 0.0–0.1)
Basophils Relative: 0 % (ref 0–1)
EOS PCT: 5 % (ref 0–5)
Eosinophils Absolute: 0.5 10*3/uL (ref 0.0–0.7)
HCT: 41.4 % (ref 39.0–52.0)
HEMOGLOBIN: 14 g/dL (ref 13.0–17.0)
Lymphocytes Relative: 25 % (ref 12–46)
Lymphs Abs: 2.2 10*3/uL (ref 0.7–4.0)
MCH: 31 pg (ref 26.0–34.0)
MCHC: 33.8 g/dL (ref 30.0–36.0)
MCV: 91.6 fL (ref 78.0–100.0)
MONO ABS: 0.7 10*3/uL (ref 0.1–1.0)
Monocytes Relative: 7 % (ref 3–12)
NEUTROS PCT: 63 % (ref 43–77)
Neutro Abs: 5.6 10*3/uL (ref 1.7–7.7)
PLATELETS: 347 10*3/uL (ref 150–400)
RBC: 4.52 MIL/uL (ref 4.22–5.81)
RDW: 12.1 % (ref 11.5–15.5)
WBC: 9 10*3/uL (ref 4.0–10.5)

## 2015-05-20 LAB — PSA: PSA: 0.29 ng/mL (ref 0.00–4.00)

## 2015-05-20 NOTE — Progress Notes (Signed)
Labs drawn

## 2015-05-22 ENCOUNTER — Ambulatory Visit (HOSPITAL_COMMUNITY): Payer: Self-pay

## 2015-05-22 ENCOUNTER — Ambulatory Visit (HOSPITAL_COMMUNITY): Payer: Self-pay | Admitting: Hematology & Oncology

## 2015-05-26 ENCOUNTER — Encounter (HOSPITAL_COMMUNITY): Payer: Self-pay | Admitting: Emergency Medicine

## 2015-05-26 ENCOUNTER — Emergency Department (HOSPITAL_COMMUNITY)
Admission: EM | Admit: 2015-05-26 | Discharge: 2015-05-26 | Disposition: A | Discharge status: Home / Self Care | Payer: 59 | Attending: Emergency Medicine | Admitting: Emergency Medicine

## 2015-05-26 DIAGNOSIS — I10 Essential (primary) hypertension: Secondary | ICD-10-CM | POA: Diagnosis not present

## 2015-05-26 DIAGNOSIS — R21 Rash and other nonspecific skin eruption: Secondary | ICD-10-CM | POA: Diagnosis present

## 2015-05-26 DIAGNOSIS — Z79899 Other long term (current) drug therapy: Secondary | ICD-10-CM | POA: Diagnosis not present

## 2015-05-26 DIAGNOSIS — Z8719 Personal history of other diseases of the digestive system: Secondary | ICD-10-CM | POA: Insufficient documentation

## 2015-05-26 DIAGNOSIS — Z87828 Personal history of other (healed) physical injury and trauma: Secondary | ICD-10-CM | POA: Diagnosis not present

## 2015-05-26 DIAGNOSIS — Z87891 Personal history of nicotine dependence: Secondary | ICD-10-CM | POA: Diagnosis not present

## 2015-05-26 DIAGNOSIS — Z7982 Long term (current) use of aspirin: Secondary | ICD-10-CM | POA: Insufficient documentation

## 2015-05-26 DIAGNOSIS — Z8639 Personal history of other endocrine, nutritional and metabolic disease: Secondary | ICD-10-CM | POA: Diagnosis not present

## 2015-05-26 DIAGNOSIS — B029 Zoster without complications: Secondary | ICD-10-CM | POA: Diagnosis not present

## 2015-05-26 DIAGNOSIS — Z8546 Personal history of malignant neoplasm of prostate: Secondary | ICD-10-CM | POA: Diagnosis not present

## 2015-05-26 DIAGNOSIS — Z87448 Personal history of other diseases of urinary system: Secondary | ICD-10-CM | POA: Diagnosis not present

## 2015-05-26 MED ORDER — PREDNISONE 10 MG PO TABS: 20.0000 mg | ORAL_TABLET | Freq: Two times a day (BID) | ORAL | Status: DC

## 2015-05-26 MED ORDER — LEVETIRACETAM 500 MG PO TABS: 500.0000 mg | ORAL_TABLET | Freq: Two times a day (BID) | ORAL | Status: DC

## 2015-05-26 MED ORDER — VALACYCLOVIR HCL 1 G PO TABS: 1000.0000 mg | ORAL_TABLET | Freq: Three times a day (TID) | ORAL | Status: AC

## 2015-05-26 MED ORDER — DIVALPROEX SODIUM 250 MG PO DR TAB: DELAYED_RELEASE_TABLET | ORAL | Status: DC

## 2015-05-26 MED ORDER — PROPRANOLOL HCL 20 MG PO TABS: 20.0000 mg | ORAL_TABLET | Freq: Three times a day (TID) | ORAL | Status: DC

## 2015-05-26 NOTE — Discharge Instructions (Signed)
Prednisone and Valtrex as prescribed.  Return to the ER if symptoms significantly worsen or change.   Shingles Shingles (herpes zoster) is an infection that is caused by the same virus that causes chickenpox (varicella). The infection causes a painful skin rash and fluid-filled blisters, which eventually break open, crust over, and heal. It may occur in any area of the body, but it usually affects only one side of the body or face. The pain of shingles usually lasts about 1 month. However, some people with shingles may develop long-term (chronic) pain in the affected area of the body. Shingles often occurs many years after the person had chickenpox. It is more common:  In people older than 50 years.  In people with weakened immune systems, such as those with HIV, AIDS, or cancer.  In people taking medicines that weaken the immune system, such as transplant medicines.  In people under great stress. CAUSES  Shingles is caused by the varicella zoster virus (VZV), which also causes chickenpox. After a person is infected with the virus, it can remain in the person's body for years in an inactive state (dormant). To cause shingles, the virus reactivates and breaks out as an infection in a nerve root. The virus can be spread from person to person (contagious) through contact with open blisters of the shingles rash. It will only spread to people who have not had chickenpox. When these people are exposed to the virus, they may develop chickenpox. They will not develop shingles. Once the blisters scab over, the person is no longer contagious and cannot spread the virus to others. SIGNS AND SYMPTOMS  Shingles shows up in stages. The initial symptoms may be pain, itching, and tingling in an area of the skin. This pain is usually described as burning, stabbing, or throbbing.In a few days or weeks, a painful red rash will appear in the area where the pain, itching, and tingling were felt. The rash is  usually on one side of the body in a band or belt-like pattern. Then, the rash usually turns into fluid-filled blisters. They will scab over and dry up in approximately 2-3 weeks. Flu-like symptoms may also occur with the initial symptoms, the rash, or the blisters. These may include:  Fever.  Chills.  Headache.  Upset stomach. DIAGNOSIS  Your health care provider will perform a skin exam to diagnose shingles. Skin scrapings or fluid samples may also be taken from the blisters. This sample will be examined under a microscope or sent to a lab for further testing. TREATMENT  There is no specific cure for shingles. Your health care provider will likely prescribe medicines to help you manage the pain, recover faster, and avoid long-term problems. This may include antiviral drugs, anti-inflammatory drugs, and pain medicines. HOME CARE INSTRUCTIONS   Take a cool bath or apply cool compresses to the area of the rash or blisters as directed. This may help with the pain and itching.   Take medicines only as directed by your health care provider.   Rest as directed by your health care provider.  Keep your rash and blisters clean with mild soap and cool water or as directed by your health care provider.  Do not pick your blisters or scratch your rash. Apply an anti-itch cream or numbing creams to the affected area as directed by your health care provider.  Keep your shingles rash covered with a loose bandage (dressing).  Avoid skin contact with:  Babies.   Pregnant women.  Children with eczema.   Elderly people with transplants.   People with chronic illnesses, such as leukemia or AIDS.   Wear loose-fitting clothing to help ease the pain of material rubbing against the rash.  Keep all follow-up visits as directed by your health care provider.If the area involved is on your face, you may receive a referral for a specialist, such as an eye doctor (ophthalmologist) or an ear,  nose, and throat (ENT) doctor. Keeping all follow-up visits will help you avoid eye problems, chronic pain, or disability.  SEEK IMMEDIATE MEDICAL CARE IF:   You have facial pain, pain around the eye area, or loss of feeling on one side of your face.  You have ear pain or ringing in your ear.  You have loss of taste.  Your pain is not relieved with prescribed medicines.   Your redness or swelling spreads.   You have more pain and swelling.  Your condition is worsening or has changed.   You have a fever. MAKE SURE YOU:  Understand these instructions.  Will watch your condition.  Will get help right away if you are not doing well or get worse. Document Released: 11/28/2005 Document Revised: 04/14/2014 Document Reviewed: 07/12/2012 [facility] Patient Information 2015 [place], [place]. This information is not intended to replace advice given to you by your health care provider. Make sure you discuss any questions you have with your health care provider.

## 2015-05-26 NOTE — ED Notes (Signed)
Patient complaining of rash on right forearm x 1 week, fever and chills starting last night. Also complaining of generalized body aches.

## 2015-05-26 NOTE — ED Provider Notes (Addendum)
CSN: [id number]     Arrival date & time 05/26/15  2010 History   First MD Initiated Contact with Patient 05/26/15 2034     Chief Complaint  Patient presents with  . Rash     (Consider location/radiation/quality/duration/timing/severity/associated sxs/prior Treatment) HPI Comments: Patient presents with complaints of rash to his right forearm. He denies any itching but reports that it burns.  Patient is a 64 y.o. male presenting with rash. The history is provided by the patient.  Rash Location: Right forearm. Quality: blistering and painful   Pain details:    Severity:  Moderate   Onset quality:  Gradual   Duration:  1 week   Timing:  Constant   Progression:  Worsening Severity:  Moderate Onset quality:  Sudden Timing:  Constant Progression:  Worsening Chronicity:  New Relieved by:  Nothing Worsened by:  Nothing tried Ineffective treatments:  None tried   Past Medical History  Diagnosis Date  . Prostate cancer   . Fatty liver   . Hypertension   . Hyperlipidemia   . Hemorrhoids     history  . Prostate cancer 06/20/2011  . Prostate carcinoma, recurrent 08/10/2012  . Gynecomastia, male 01/11/2013    Secondary to prostate ca Tx.   Marland Kitchen History of back injury 11/19/2013   Past Surgical History  Procedure Laterality Date  . Prostate biopsy  11/06  . Esophagogastroduodenoscopy    . Knee surgery      left  . Ankle surgery  1987    left  . Hernia repair  2010  . Colonoscopy  2008   Family History  Problem Relation Age of Onset  . Diabetes Father   . Cancer Sister   . Diabetes Brother   . Heart failure Mother    History  Substance Use Topics  . Smoking status: Former Smoker -- 2.50 packs/day for 3 years  . Smokeless tobacco: Never Used  . Alcohol Use: No     Comment: former drinker 20 years ago    Review of Systems  Skin: Positive for rash.  All other systems reviewed and are negative.     Allergies  Tylenol  Home Medications   Prior to Admission  medications   Medication Sig Start Date End Date Taking? Authorizing Provider  aspirin EC 81 MG EC tablet Take 1 tablet (81 mg total) by mouth daily. 04/04/14   Rosita Fire, MD  bicalutamide (CASODEX) 50 MG tablet Take 1 tablet (50 mg total) by mouth daily. 04/16/15   Baird Cancer, PA-C  CALCIUM PO Take 1 tablet by mouth 2 (two) times daily.    Historical Provider, MD  divalproex (DEPAKOTE) 250 MG DR tablet 2 tabs my mouth AM and 3 tabs PM 05/26/15   Evalee Jefferson, PA-C  hydrochlorothiazide (HYDRODIURIL) 25 MG tablet Take 25 mg by mouth daily.    Sinda Du, MD  ibuprofen (ADVIL,MOTRIN) 800 MG tablet Take 1,600 mg by mouth daily as needed for headache.    Historical Provider, MD  levETIRAcetam (KEPPRA) 500 MG tablet Take 1 tablet (500 mg total) by mouth 2 (two) times daily. 05/26/15   Evalee Jefferson, PA-C  Melatonin 1 MG TABS Take 2 tablets by mouth at bedtime.    Historical Provider, MD  Potassium 99 MG TABS Take 1 tablet by mouth daily.    Historical Provider, MD  propranolol (INDERAL) 20 MG tablet Take 1 tablet (20 mg total) by mouth 3 (three) times daily. 05/26/15   Evalee Jefferson, PA-C  sertraline (ZOLOFT) 50  MG tablet Take 50 mg by mouth daily.    Historical Provider, MD  traMADol (ULTRAM) 50 MG tablet Take by mouth 3 (three) times daily as needed.    Historical Provider, MD   BP 157/91 mmHg  Pulse 89  Temp(Src) 99.4 F (37.4 C) (Oral)  Resp 16  Ht 5\' 7"  (1.702 m)  Wt 228 lb (103.42 kg)  BMI 35.70 kg/m2  SpO2 95% Physical Exam  Constitutional: He is oriented to person, place, and time. He appears well-developed and well-nourished. No distress.  HENT:  Head: Normocephalic and atraumatic.  Neck: Normal range of motion. Neck supple.  Neurological: He is alert and oriented to person, place, and time.  Skin: Skin is warm and dry. He is not diaphoretic.  There is a vesicular rash to the dorsal aspect of the right forearm in a dermatomal pattern consistent with a shingles.  Nursing note and  vitals reviewed.   ED Course  Procedures (including critical care time) Labs Review Labs Reviewed - No data to display  Imaging Review No results found.   EKG Interpretation None      MDM   Final diagnoses:  None    Will treat with Valtrex, prednisone, and when necessary return. Patient advised to avoid contact with immunocompromised people, pregnant women who had never had chickenpox or immunization.    Veryl Speak, MD 05/26/15 2046  Veryl Speak, MD 05/26/15 2129

## 2015-05-31 NOTE — Progress Notes (Signed)
This encounter was created in error - please disregard.

## 2015-06-10 ENCOUNTER — Inpatient Hospital Stay: Admission: EM | Admit: 2015-06-10 | Payer: Self-pay | Admitting: Orthopedic Surgery

## 2015-06-10 ENCOUNTER — Emergency Department (HOSPITAL_COMMUNITY): Payer: 59 | Admitting: Anesthesiology

## 2015-06-10 ENCOUNTER — Emergency Department (HOSPITAL_COMMUNITY): Payer: 59

## 2015-06-10 ENCOUNTER — Encounter (HOSPITAL_COMMUNITY): Payer: Self-pay | Admitting: Emergency Medicine

## 2015-06-10 ENCOUNTER — Ambulatory Visit: Admit: 2015-06-10 | Payer: Self-pay | Admitting: Orthopedic Surgery

## 2015-06-10 ENCOUNTER — Encounter (HOSPITAL_COMMUNITY): Payer: Self-pay | Admitting: Anesthesiology

## 2015-06-10 ENCOUNTER — Encounter (HOSPITAL_COMMUNITY)
Admission: EM | Disposition: A | Discharge status: Home / Self Care | Payer: Self-pay | Source: Home / Self Care | Attending: Emergency Medicine

## 2015-06-10 ENCOUNTER — Emergency Department (HOSPITAL_COMMUNITY)
Admission: EM | Admit: 2015-06-10 | Discharge: 2015-06-10 | Disposition: A | Discharge status: Home / Self Care | Payer: 59 | Attending: Emergency Medicine | Admitting: Emergency Medicine

## 2015-06-10 DIAGNOSIS — Z8719 Personal history of other diseases of the digestive system: Secondary | ICD-10-CM | POA: Insufficient documentation

## 2015-06-10 DIAGNOSIS — Z79899 Other long term (current) drug therapy: Secondary | ICD-10-CM | POA: Insufficient documentation

## 2015-06-10 DIAGNOSIS — S6992XA Unspecified injury of left wrist, hand and finger(s), initial encounter: Secondary | ICD-10-CM

## 2015-06-10 DIAGNOSIS — S61216A Laceration without foreign body of right little finger without damage to nail, initial encounter: Secondary | ICD-10-CM | POA: Insufficient documentation

## 2015-06-10 DIAGNOSIS — Z9889 Other specified postprocedural states: Secondary | ICD-10-CM | POA: Insufficient documentation

## 2015-06-10 DIAGNOSIS — Y998 Other external cause status: Secondary | ICD-10-CM | POA: Insufficient documentation

## 2015-06-10 DIAGNOSIS — Y9389 Activity, other specified: Secondary | ICD-10-CM | POA: Insufficient documentation

## 2015-06-10 DIAGNOSIS — Z8639 Personal history of other endocrine, nutritional and metabolic disease: Secondary | ICD-10-CM | POA: Insufficient documentation

## 2015-06-10 DIAGNOSIS — T704XXA Effects of high-pressure fluids, initial encounter: Secondary | ICD-10-CM | POA: Insufficient documentation

## 2015-06-10 DIAGNOSIS — I10 Essential (primary) hypertension: Secondary | ICD-10-CM | POA: Insufficient documentation

## 2015-06-10 DIAGNOSIS — Z87438 Personal history of other diseases of male genital organs: Secondary | ICD-10-CM | POA: Insufficient documentation

## 2015-06-10 DIAGNOSIS — S61215A Laceration without foreign body of left ring finger without damage to nail, initial encounter: Secondary | ICD-10-CM | POA: Insufficient documentation

## 2015-06-10 DIAGNOSIS — C61 Malignant neoplasm of prostate: Secondary | ICD-10-CM | POA: Diagnosis not present

## 2015-06-10 DIAGNOSIS — Y9289 Other specified places as the place of occurrence of the external cause: Secondary | ICD-10-CM | POA: Insufficient documentation

## 2015-06-10 DIAGNOSIS — Z8546 Personal history of malignant neoplasm of prostate: Secondary | ICD-10-CM | POA: Insufficient documentation

## 2015-06-10 DIAGNOSIS — Z87891 Personal history of nicotine dependence: Secondary | ICD-10-CM | POA: Insufficient documentation

## 2015-06-10 DIAGNOSIS — X58XXXA Exposure to other specified factors, initial encounter: Secondary | ICD-10-CM | POA: Insufficient documentation

## 2015-06-10 HISTORY — PX: I & D EXTREMITY: SHX5045

## 2015-06-10 LAB — BASIC METABOLIC PANEL
ANION GAP: 12 (ref 5–15)
BUN: 34 mg/dL — AB (ref 6–20)
CALCIUM: 9.7 mg/dL (ref 8.9–10.3)
CHLORIDE: 103 mmol/L (ref 101–111)
CO2: 22 mmol/L (ref 22–32)
CREATININE: 1.15 mg/dL (ref 0.61–1.24)
GFR calc Af Amer: >60 mL/min (ref >60)
GFR calc non Af Amer: >60 mL/min (ref >60)
GLUCOSE: 131 mg/dL — AB (ref 65–99)
Potassium: 3.3 mmol/L — ABNORMAL LOW (ref 3.5–5.1)
Sodium: 137 mmol/L (ref 135–145)

## 2015-06-10 LAB — CBC WITH DIFFERENTIAL/PLATELET
BASOS PCT: 0 % (ref 0–1)
Basophils Absolute: 0 10*3/uL (ref 0.0–0.1)
EOS PCT: 1 % (ref 0–5)
Eosinophils Absolute: 0.2 10*3/uL (ref 0.0–0.7)
HCT: 43.1 % (ref 39.0–52.0)
Hemoglobin: 14.8 g/dL (ref 13.0–17.0)
LYMPHS PCT: 18 % (ref 12–46)
Lymphs Abs: 2.8 10*3/uL (ref 0.7–4.0)
MCH: 31.6 pg (ref 26.0–34.0)
MCHC: 34.3 g/dL (ref 30.0–36.0)
MCV: 91.9 fL (ref 78.0–100.0)
MONO ABS: 0.7 10*3/uL (ref 0.1–1.0)
Monocytes Relative: 5 % (ref 3–12)
Neutro Abs: 11.6 10*3/uL — ABNORMAL HIGH (ref 1.7–7.7)
Neutrophils Relative %: 76 % (ref 43–77)
Platelets: 391 10*3/uL (ref 150–400)
RBC: 4.69 MIL/uL (ref 4.22–5.81)
RDW: 13.1 % (ref 11.5–15.5)
WBC: 15.3 10*3/uL — AB (ref 4.0–10.5)

## 2015-06-10 SURGERY — IRRIGATION AND DEBRIDEMENT EXTREMITY
Anesthesia: General | Laterality: Left

## 2015-06-10 SURGERY — IRRIGATION AND DEBRIDEMENT EXTREMITY
Anesthesia: General | Site: Hand | Laterality: Left

## 2015-06-10 MED ORDER — PROPOFOL 10 MG/ML IV BOLUS
INTRAVENOUS | Status: AC
  Filled 2015-06-10: qty 20

## 2015-06-10 MED ORDER — BUPIVACAINE HCL (PF) 0.25 % IJ SOLN
INTRAMUSCULAR | Status: AC
  Filled 2015-06-10: qty 30

## 2015-06-10 MED ORDER — SODIUM CHLORIDE 0.9 % IJ SOLN
INTRAMUSCULAR | Status: AC
  Filled 2015-06-10: qty 10

## 2015-06-10 MED ORDER — PHENYLEPHRINE HCL 10 MG/ML IJ SOLN
INTRAMUSCULAR | Status: AC
  Filled 2015-06-10: qty 3

## 2015-06-10 MED ORDER — LIDOCAINE HCL (CARDIAC) 20 MG/ML IV SOLN
INTRAVENOUS | Status: DC | PRN
  Administered 2015-06-10: 70 mg via INTRAVENOUS

## 2015-06-10 MED ORDER — FENTANYL CITRATE (PF) 100 MCG/2ML IJ SOLN
INTRAMUSCULAR | Status: DC | PRN
  Administered 2015-06-10: 100 ug via INTRAVENOUS
  Administered 2015-06-10 (×3): 50 ug via INTRAVENOUS

## 2015-06-10 MED ORDER — STERILE WATER FOR INJECTION IJ SOLN
INTRAMUSCULAR | Status: AC
  Filled 2015-06-10: qty 10

## 2015-06-10 MED ORDER — ROCURONIUM BROMIDE 50 MG/5ML IV SOLN
INTRAVENOUS | Status: AC
  Filled 2015-06-10: qty 1

## 2015-06-10 MED ORDER — LACTATED RINGERS IV SOLN
INTRAVENOUS | Status: DC | PRN
  Administered 2015-06-10 (×2): via INTRAVENOUS

## 2015-06-10 MED ORDER — PHENYLEPHRINE HCL 10 MG/ML IJ SOLN
INTRAMUSCULAR | Status: DC | PRN
  Administered 2015-06-10 (×3): 40 ug via INTRAVENOUS

## 2015-06-10 MED ORDER — SODIUM CHLORIDE 0.9 % IR SOLN
Status: DC | PRN
  Administered 2015-06-10: 3000 mL
  Administered 2015-06-10: 1000 mL

## 2015-06-10 MED ORDER — LIDOCAINE HCL (CARDIAC) 20 MG/ML IV SOLN
INTRAVENOUS | Status: AC
  Filled 2015-06-10: qty 20

## 2015-06-10 MED ORDER — MIDAZOLAM HCL 5 MG/5ML IJ SOLN
INTRAMUSCULAR | Status: DC | PRN
  Administered 2015-06-10: 2 mg via INTRAVENOUS

## 2015-06-10 MED ORDER — DIPHTH-ACELL PERTUSSIS-TETANUS 25-58-10 LF-MCG/0.5 IM SUSP
0.5000 mL | Freq: Once | INTRAMUSCULAR | Status: AC
  Administered 2015-06-10: 0.5 mL via INTRAMUSCULAR
  Filled 2015-06-10: qty 0.5

## 2015-06-10 MED ORDER — SUCCINYLCHOLINE CHLORIDE 20 MG/ML IJ SOLN
INTRAMUSCULAR | Status: DC | PRN
  Administered 2015-06-10: 120 mg via INTRAVENOUS

## 2015-06-10 MED ORDER — OXYCODONE HCL 5 MG PO TABS
5.0000 mg | ORAL_TABLET | Freq: Once | ORAL | Status: AC
  Administered 2015-06-10: 5 mg via ORAL
  Filled 2015-06-10: qty 1

## 2015-06-10 MED ORDER — PHENYLEPHRINE 40 MCG/ML (10ML) SYRINGE FOR IV PUSH (FOR BLOOD PRESSURE SUPPORT)
PREFILLED_SYRINGE | INTRAVENOUS | Status: AC
  Filled 2015-06-10: qty 40

## 2015-06-10 MED ORDER — BUPIVACAINE HCL (PF) 0.25 % IJ SOLN
INTRAMUSCULAR | Status: DC | PRN
  Administered 2015-06-10: 4 mL

## 2015-06-10 MED ORDER — OXYCODONE-ACETAMINOPHEN 5-325 MG PO TABS: 1.0000 | ORAL_TABLET | ORAL | Status: DC | PRN

## 2015-06-10 MED ORDER — ONDANSETRON HCL 4 MG/2ML IJ SOLN
INTRAMUSCULAR | Status: DC | PRN
  Administered 2015-06-10: 4 mg via INTRAVENOUS

## 2015-06-10 MED ORDER — FENTANYL CITRATE (PF) 100 MCG/2ML IJ SOLN: 25.0000 ug | INTRAMUSCULAR | Status: DC | PRN

## 2015-06-10 MED ORDER — FENTANYL CITRATE (PF) 250 MCG/5ML IJ SOLN
INTRAMUSCULAR | Status: AC
  Filled 2015-06-10: qty 5

## 2015-06-10 MED ORDER — SUCCINYLCHOLINE CHLORIDE 20 MG/ML IJ SOLN
INTRAMUSCULAR | Status: AC
  Filled 2015-06-10: qty 1

## 2015-06-10 MED ORDER — CEFAZOLIN SODIUM-DEXTROSE 2-3 GM-% IV SOLR
INTRAVENOUS | Status: DC | PRN
  Administered 2015-06-10: 2 g via INTRAVENOUS

## 2015-06-10 MED ORDER — EPHEDRINE SULFATE 50 MG/ML IJ SOLN
INTRAMUSCULAR | Status: DC | PRN
  Administered 2015-06-10 (×2): 10 mg via INTRAVENOUS

## 2015-06-10 MED ORDER — MIDAZOLAM HCL 2 MG/2ML IJ SOLN
INTRAMUSCULAR | Status: AC
  Filled 2015-06-10: qty 2

## 2015-06-10 MED ORDER — PROPOFOL 10 MG/ML IV BOLUS
INTRAVENOUS | Status: DC | PRN
  Administered 2015-06-10: 20 mg via INTRAVENOUS
  Administered 2015-06-10: 170 mg via INTRAVENOUS
  Administered 2015-06-10: 20 mg via INTRAVENOUS

## 2015-06-10 SURGICAL SUPPLY — 54 items
BANDAGE ELASTIC 3 VELCRO ST LF (GAUZE/BANDAGES/DRESSINGS) ×3 IMPLANT
BANDAGE ELASTIC 4 VELCRO ST LF (GAUZE/BANDAGES/DRESSINGS) ×3 IMPLANT
BNDG CMPR 9X4 STRL LF SNTH (GAUZE/BANDAGES/DRESSINGS)
BNDG CONFORM 2 STRL LF (GAUZE/BANDAGES/DRESSINGS) IMPLANT
BNDG ESMARK 4X9 LF (GAUZE/BANDAGES/DRESSINGS) IMPLANT
BNDG GAUZE ELAST 4 BULKY (GAUZE/BANDAGES/DRESSINGS) ×4 IMPLANT
CORDS BIPOLAR (ELECTRODE) ×2 IMPLANT
COVER SURGICAL LIGHT HANDLE (MISCELLANEOUS) ×6 IMPLANT
CUFF TOURNIQUET SINGLE 18IN (TOURNIQUET CUFF) ×3 IMPLANT
DECANTER SPIKE VIAL GLASS SM (MISCELLANEOUS) ×3 IMPLANT
DRAIN PENROSE 1/4X12 LTX STRL (WOUND CARE) IMPLANT
DRAPE SURG 17X23 STRL (DRAPES) ×3 IMPLANT
DRSG PAD ABDOMINAL 8X10 ST (GAUZE/BANDAGES/DRESSINGS) IMPLANT
DURAPREP 26ML APPLICATOR (WOUND CARE) IMPLANT
ELECT REM PT RETURN 9FT ADLT (ELECTROSURGICAL) ×3
ELECTRODE REM PT RTRN 9FT ADLT (ELECTROSURGICAL) IMPLANT
GAUZE PACKING IODOFORM 1/4X5 (PACKING) IMPLANT
GAUZE SPONGE 4X4 12PLY STRL (GAUZE/BANDAGES/DRESSINGS) ×3 IMPLANT
GAUZE XEROFORM 1X8 LF (GAUZE/BANDAGES/DRESSINGS) ×3 IMPLANT
GLOVE SURG SYN 8.0 (GLOVE) ×3 IMPLANT
GLOVE SURG SYN 8.0 PF PI (GLOVE) ×1 IMPLANT
GOWN STRL REUS W/ TWL LRG LVL3 (GOWN DISPOSABLE) ×1 IMPLANT
GOWN STRL REUS W/ TWL XL LVL3 (GOWN DISPOSABLE) ×1 IMPLANT
GOWN STRL REUS W/TWL LRG LVL3 (GOWN DISPOSABLE) ×3
GOWN STRL REUS W/TWL XL LVL3 (GOWN DISPOSABLE) ×3
HANDPIECE INTERPULSE COAX TIP (DISPOSABLE)
KIT BASIN OR (CUSTOM PROCEDURE TRAY) ×3 IMPLANT
KIT ROOM TURNOVER OR (KITS) ×3 IMPLANT
MANIFOLD NEPTUNE II (INSTRUMENTS) ×3 IMPLANT
NEEDLE HYPO 25GX1X1/2 BEV (NEEDLE) ×3 IMPLANT
NEEDLE HYPO 25X1 1.5 SAFETY (NEEDLE) ×3 IMPLANT
NS IRRIG 1000ML POUR BTL (IV SOLUTION) ×3 IMPLANT
PACK ORTHO EXTREMITY (CUSTOM PROCEDURE TRAY) ×3 IMPLANT
PAD ARMBOARD 7.5X6 YLW CONV (MISCELLANEOUS) ×6 IMPLANT
PAD CAST 4YDX4 CTTN HI CHSV (CAST SUPPLIES) ×2 IMPLANT
PADDING CAST COTTON 4X4 STRL (CAST SUPPLIES) ×3
SET HNDPC FAN SPRY TIP SCT (DISPOSABLE) IMPLANT
SPLINT PLASTER CAST XFAST 3X15 (CAST SUPPLIES) IMPLANT
SPLINT PLASTER XTRA FASTSET 3X (CAST SUPPLIES) ×2
SPONGE LAP 18X18 X RAY DECT (DISPOSABLE) ×3 IMPLANT
SUCTION FRAZIER TIP 10 FR DISP (SUCTIONS) ×3 IMPLANT
SUT ETHILON 4 0 PS 2 18 (SUTURE) ×2 IMPLANT
SUT ETHILON 9 0 BV130 4 (SUTURE) ×3 IMPLANT
SUT VICRYL RAPIDE 4/0 PS 2 (SUTURE) IMPLANT
SYR 20CC LL (SYRINGE) IMPLANT
SYR CONTROL 10ML LL (SYRINGE) ×3 IMPLANT
TOWEL OR 17X24 6PK STRL BLUE (TOWEL DISPOSABLE) ×3 IMPLANT
TOWEL OR 17X26 10 PK STRL BLUE (TOWEL DISPOSABLE) ×3 IMPLANT
TUBE ANAEROBIC SPECIMEN COL (MISCELLANEOUS) IMPLANT
TUBE CONNECTING 12'X1/4 (SUCTIONS) ×1
TUBE CONNECTING 12X1/4 (SUCTIONS) ×2 IMPLANT
UNDERPAD 30X30 INCONTINENT (UNDERPADS AND DIAPERS) ×3 IMPLANT
WATER STERILE IRR 1000ML POUR (IV SOLUTION) ×3 IMPLANT
YANKAUER SUCT BULB TIP NO VENT (SUCTIONS) ×3 IMPLANT

## 2015-06-10 NOTE — H&P (Signed)
Walter Boone is an 64 y.o. male.   Chief Complaint: left hand pain and numbness HPI: as above s/p powerwasher injury to volar aspect of left hand  Past Medical History  Diagnosis Date  . Prostate cancer   . Fatty liver   . Hypertension   . Hyperlipidemia   . Hemorrhoids     history  . Prostate cancer 06/20/2011  . Prostate carcinoma, recurrent 08/10/2012  . Gynecomastia, male 01/11/2013    Secondary to prostate ca Tx.   Marland Kitchen History of back injury 11/19/2013    Past Surgical History  Procedure Laterality Date  . Prostate biopsy  11/06  . Esophagogastroduodenoscopy    . Knee surgery      left  . Ankle surgery  1987    left  . Hernia repair  2010  . Colonoscopy  2008    Family History  Problem Relation Age of Onset  . Diabetes Father   . Cancer Sister   . Diabetes Brother   . Heart failure Mother    Social History:  reports that he has quit smoking. He has never used smokeless tobacco. He reports that he does not drink alcohol or use illicit drugs.  Allergies:  Allergies  Allergen Reactions  . Tylenol [Acetaminophen]     Told not to take this because of problems with the cancer medications.    Medications Prior to Admission  Medication Sig Dispense Refill  . bicalutamide (CASODEX) 50 MG tablet Take 1 tablet (50 mg total) by mouth daily. 30 tablet 5  . hydrochlorothiazide (HYDRODIURIL) 25 MG tablet Take 25 mg by mouth daily.    Marland Kitchen Leuprolide Acetate ([name] IJ) Inject as directed every 3 (three) months.    . divalproex (DEPAKOTE) 250 MG DR tablet 2 tabs my mouth AM and 3 tabs PM (Patient not taking: Reported on 06/10/2015) 100 tablet 0  . levETIRAcetam (KEPPRA) 500 MG tablet Take 1 tablet (500 mg total) by mouth 2 (two) times daily. (Patient not taking: Reported on 06/10/2015) 40 tablet 0  . predniSONE (DELTASONE) 10 MG tablet Take 2 tablets (20 mg total) by mouth 2 (two) times daily. (Patient not taking: Reported on 06/10/2015) 20 tablet 0  . propranolol (INDERAL) 20 MG  tablet Take 1 tablet (20 mg total) by mouth 3 (three) times daily. (Patient not taking: Reported on 06/10/2015) 40 tablet 0    Results for orders placed or performed during the hospital encounter of 06/10/15 (from the past 48 hour(s))  CBC with Differential/Platelet     Status: Abnormal   Collection Time: 06/10/15  7:01 PM  Result Value Ref Range   WBC 15.3 (H) 4.0 - 10.5 K/uL   RBC 4.69 4.22 - 5.81 MIL/uL   Hemoglobin 14.8 13.0 - 17.0 g/dL   HCT 43.1 39.0 - 52.0 %   MCV 91.9 78.0 - 100.0 fL   MCH 31.6 26.0 - 34.0 pg   MCHC 34.3 30.0 - 36.0 g/dL   RDW 13.1 11.5 - 15.5 %   Platelets 391 150 - 400 K/uL   Neutrophils Relative % 76 43 - 77 %   Neutro Abs 11.6 (H) 1.7 - 7.7 K/uL   Lymphocytes Relative 18 12 - 46 %   Lymphs Abs 2.8 0.7 - 4.0 K/uL   Monocytes Relative 5 3 - 12 %   Monocytes Absolute 0.7 0.1 - 1.0 K/uL   Eosinophils Relative 1 0 - 5 %   Eosinophils Absolute 0.2 0.0 - 0.7 K/uL   Basophils Relative  0 0 - 1 %   Basophils Absolute 0.0 0.0 - 0.1 K/uL  Basic metabolic panel     Status: Abnormal   Collection Time: 06/10/15  7:01 PM  Result Value Ref Range   Sodium 137 135 - 145 mmol/L   Potassium 3.3 (L) 3.5 - 5.1 mmol/L   Chloride 103 101 - 111 mmol/L   CO2 22 22 - 32 mmol/L   Glucose, Bld 131 (H) 65 - 99 mg/dL   BUN 34 (H) 6 - 20 mg/dL   Creatinine, Ser 1.15 0.61 - 1.24 mg/dL   Calcium 9.7 8.9 - 10.3 mg/dL   GFR calc non Af Amer >60 >60 mL/min   GFR calc Af Amer >60 >60 mL/min    Comment: (NOTE) The eGFR has been calculated using the CKD EPI equation. This calculation has not been validated in all clinical situations. eGFR's persistently <60 mL/min signify possible Chronic Kidney Disease.    Anion gap 12 5 - 15   Dg Hand Complete Left  06/10/2015   CLINICAL DATA:  64 year old male with laceration to the left hand between the fourth and fifth metacarpals. Numbness to the fourth digit.  EXAM: LEFT HAND - COMPLETE 3+ VIEW  COMPARISON:  Radiograph dated 08/02/2011   FINDINGS: There is no fracture or dislocation. There is mild degenerative changes of the carpal joint with mild osteoarthritic changes of the first carpometacarpal joint. There is mild narrowing of the distal interphalangeal joint of the second digit. There is extensive diffuse soft tissue gas primarily involving the fourth and fifth digits and extending along the soft tissues of the fourth metacarpal. No radiopaque foreign object identified.  IMPRESSION: No acute fracture or dislocation.  Diffuse soft tissue swelling and soft tissue gas along the fourth and fifth digit. No radiopaque foreign object.   Electronically Signed   By: Anner Crete M.D.   On: 06/10/2015 15:45    Review of Systems  All other systems reviewed and are negative.   Blood pressure 131/84, pulse 106, temperature 98.2 F (36.8 C), temperature source Oral, resp. rate 20, SpO2 97 %. Physical Exam  Constitutional: He is oriented to person, place, and time. He appears well-developed and well-nourished.  HENT:  Head: Normocephalic and atraumatic.  Cardiovascular: Normal rate.   Respiratory: Effort normal.  Musculoskeletal:       Left hand: He exhibits tenderness, disruption of two-point discrimination, laceration and swelling.  Left hand pressure washer/injection injury at volar aspect between ring and small metacarpals with ring finger numbness and swelling  Neurological: He is alert and oriented to person, place, and time.  Skin: Skin is warm.  Psychiatric: He has a normal mood and affect. His behavior is normal. Judgment and thought content normal.     Assessment/Plan As above   Plan I and D above with repairs as needed   Have discussed the significance of this type of injury and the high rate of complications including amputation and the need for possible multiple surgeries  Ambrea Hegler A 06/10/2015, 8:57 PM

## 2015-06-10 NOTE — ED Provider Notes (Signed)
CSN: [id number]     Arrival date & time 06/10/15  1501 History   First MD Initiated Contact with Patient 06/10/15 1714     Chief Complaint  Patient presents with  . Hand Injury     (Consider location/radiation/quality/duration/timing/severity/associated sxs/prior Treatment) Patient is a 64 y.o. male presenting with skin laceration. The history is provided by the patient.  Laceration Location:  Hand Hand laceration location:  L finger Depth:  Through dermis Bleeding: controlled   Injury mechanism: water from pressure washer. Pain details:    Quality:  Burning, tingling and numbness   Severity:  Severe   Timing:  Constant   Progression:  Unchanged Foreign body present:  Unable to specify Relieved by:  Nothing Worsened by:  Movement Ineffective treatments:  None tried  Walter Boone is a 64 y.o. male who presents to the ED with a laceration to the hand that occurred while using a pressure washer and the water cut his hand. Patient states he picked up the washer and squeezed the handel with his right hand and his left hand was in the way. Patient states that the water was mixed with bleach and a commercial cleaning solution.  Patient is right hand dominant.   Past Medical History  Diagnosis Date  . Prostate cancer   . Fatty liver   . Hypertension   . Hyperlipidemia   . Hemorrhoids     history  . Prostate cancer 06/20/2011  . Prostate carcinoma, recurrent 08/10/2012  . Gynecomastia, male 01/11/2013    Secondary to prostate ca Tx.   Marland Kitchen History of back injury 11/19/2013   Past Surgical History  Procedure Laterality Date  . Prostate biopsy  11/06  . Esophagogastroduodenoscopy    . Knee surgery      left  . Ankle surgery  1987    left  . Hernia repair  2010  . Colonoscopy  2008   Family History  Problem Relation Age of Onset  . Diabetes Father   . Cancer Sister   . Diabetes Brother   . Heart failure Mother    History  Substance Use Topics  . Smoking status: Former  Smoker -- 2.50 packs/day for 3 years  . Smokeless tobacco: Never Used  . Alcohol Use: No     Comment: former drinker 20 years ago    Review of Systems Negative except as stated in HPI   Allergies  Tylenol  Home Medications   Prior to Admission medications   Medication Sig Start Date End Date Taking? Authorizing Provider  bicalutamide (CASODEX) 50 MG tablet Take 1 tablet (50 mg total) by mouth daily. 04/16/15  Yes Manon Hilding Kefalas, PA-C  hydrochlorothiazide (HYDRODIURIL) 25 MG tablet Take 25 mg by mouth daily.   Yes Sinda Du, MD  Leuprolide Acetate ([name] IJ) Inject as directed every 3 (three) months.   Yes Historical Provider, MD  divalproex (DEPAKOTE) 250 MG DR tablet 2 tabs my mouth AM and 3 tabs PM Patient not taking: Reported on 06/10/2015 05/26/15   Evalee Jefferson, PA-C  levETIRAcetam (KEPPRA) 500 MG tablet Take 1 tablet (500 mg total) by mouth 2 (two) times daily. Patient not taking: Reported on 06/10/2015 05/26/15   Evalee Jefferson, PA-C  predniSONE (DELTASONE) 10 MG tablet Take 2 tablets (20 mg total) by mouth 2 (two) times daily. Patient not taking: Reported on 06/10/2015 05/26/15   Veryl Speak, MD  propranolol (INDERAL) 20 MG tablet Take 1 tablet (20 mg total) by mouth 3 (three) times daily.  Patient not taking: Reported on 06/10/2015 05/26/15   Evalee Jefferson, PA-C   BP 131/84 mmHg  Pulse 106  Temp(Src) 98.2 F (36.8 C) (Oral)  Resp 20  SpO2 97% Physical Exam  Constitutional: He is oriented to person, place, and time. He appears well-developed and well-nourished.  HENT:  Head: Normocephalic.  Eyes: EOM are normal.  Neck: Neck supple.  Cardiovascular: Normal rate.   Pulmonary/Chest: Effort normal.  Musculoskeletal: Normal range of motion. He exhibits tenderness.       Left hand: He exhibits tenderness, laceration and swelling. Decreased sensation noted. Normal strength noted.       Hands: Laceration of the left ring finger on the ulnar side that starts at the DIP and goes to  the base of the finger. Decreased sensation. Good strength, he is able to fully extend the fingers, he can flex but due to swelling and pain is limited.  Laceration to the little finger ulnar side.  Neurological: He is alert and oriented to person, place, and time. No cranial nerve deficit.  Skin: Skin is warm and dry.  Psychiatric: He has a normal mood and affect. His behavior is normal.  Nursing note and vitals reviewed.   ED Course  Procedures (including critical care time) Dr. Christy Gentles in to see the patient.   Labs Review Labs Reviewed - No data to display  Imaging Review Dg Hand Complete Left  06/10/2015   CLINICAL DATA:  64 year old male with laceration to the left hand between the fourth and fifth metacarpals. Numbness to the fourth digit.  EXAM: LEFT HAND - COMPLETE 3+ VIEW  COMPARISON:  Radiograph dated 08/02/2011  FINDINGS: There is no fracture or dislocation. There is mild degenerative changes of the carpal joint with mild osteoarthritic changes of the first carpometacarpal joint. There is mild narrowing of the distal interphalangeal joint of the second digit. There is extensive diffuse soft tissue gas primarily involving the fourth and fifth digits and extending along the soft tissues of the fourth metacarpal. No radiopaque foreign object identified.  IMPRESSION: No acute fracture or dislocation.  Diffuse soft tissue swelling and soft tissue gas along the fourth and fifth digit. No radiopaque foreign object.   Electronically Signed   By: Anner Crete M.D.   On: 06/10/2015 15:45   Consult with Dr. Burney Gauze. Dr. Christy Gentles spoke with Dr. Burney Gauze and will have patient go the the [facility] short stay.    Tetanus updated  MDM  64 y.o. male with hand injury due to pressure washer. He will go to Short Stay via private car.  Final diagnoses:  Hand injury, left, initial encounter       [facility], NP 06/10/15 2317  Ripley Fraise, MD 06/10/15 [phone]

## 2015-06-10 NOTE — ED Notes (Signed)
Laceration to hand from pressure washer water stream.

## 2015-06-10 NOTE — Transfer of Care (Signed)
Immediate Anesthesia Transfer of Care Note  Patient: Walter Boone  Procedure(s) Performed: Procedure(s): IRRIGATION AND DEBRIDEMENT EXTREMITY LEFT HAND EXPLORATION, nerve repair (Left)  Patient Location: PACU  Anesthesia Type:General  Level of Consciousness: awake and alert   Airway & Oxygen Therapy: Patient Spontanous Breathing and Patient connected to nasal cannula oxygen  Post-op Assessment: Report given to RN, Post -op Vital signs reviewed and stable and Patient moving all extremities  Post vital signs: Reviewed and stable  Last Vitals:  Filed Vitals:   06/10/15 1516  BP: 131/84  Pulse: 106  Temp: 36.8 C  Resp: 20    Complications: No apparent anesthesia complications

## 2015-06-10 NOTE — ED Notes (Signed)
Hand (Dr. Oneta Rack) paged.

## 2015-06-10 NOTE — ED Provider Notes (Signed)
Patient seen/examined in the Emergency Department in conjunction with Midlevel Provider  Patient reports injury to hand from pressure washer with solution  Plan: advised Hand consultation, patient agreeable with plan   Ripley Fraise, MD 06/10/15 1807

## 2015-06-10 NOTE — ED Provider Notes (Signed)
Discussed wound/injury to dr Burney Gauze [facility] to [facility] for [facility] and possible surgery Pt is acquiring a ride to [place]   Ripley Fraise, MD 06/10/15 [phone]

## 2015-06-10 NOTE — Anesthesia Procedure Notes (Signed)
Procedure Name: Intubation Date/Time: 06/10/2015 9:32 PM Performed by: Suzy Bouchard Pre-anesthesia Checklist: Patient identified, Timeout performed, Emergency Drugs available, Suction available and Patient being monitored Patient Re-evaluated:Patient Re-evaluated prior to inductionOxygen Delivery Method: Circle system utilized Preoxygenation: Pre-oxygenation with 100% oxygen Intubation Type: IV induction, Cricoid Pressure applied and Rapid sequence Ventilation: Mask ventilation without difficulty Laryngoscope Size: Miller and 2 Grade View: Grade I Tube type: Oral Tube size: 7.0 mm Number of attempts: 1 Airway Equipment and Method: Stylet Placement Confirmation: ETT inserted through vocal cords under direct vision,  breath sounds checked- equal and bilateral and positive ETCO2 Secured at: 22 cm Tube secured with: Tape Dental Injury: Teeth and Oropharynx as per pre-operative assessment

## 2015-06-10 NOTE — Discharge Instructions (Signed)
Keep dressing dry and maintain dressing until follow-up visit with Dr. Burney Gauze. Acute Compartment Syndrome Compartment syndrome is a painful condition that occurs when swelling and pressure build up in a body space (compartment) of the arms or legs. Groups of muscles, nerves, and blood vessels in the arms and legs are separated into various compartments. Each compartment is surrounded by tough layers of tissue called fascia. In compartment syndrome, pressure builds up within the layers of fascia and begins to push on the structures within that compartment.  In acute compartment syndrome, the pressure builds up suddenly, often as the result of an injury. This is a surgical emergency. When a muscle in the compartment moves, you may feel severe pain. If pressure continues to increase, it can block the flow of blood in the smallest blood vessels (capillaries). Then, the nerves and muscles in the compartment cannot get enough oxygen and nutrients (substances needed for survival). They will start to die within 4-8 hours. That is why the pressure needs to be relieved immediately. Identifying the condition early and treating it quickly can prevent most problems. CAUSES  Various things can lead to compartment syndrome. Possible causes include:   Injury. Some injuries can cause swelling or bleeding in a compartment. This can lead to compartment syndrome. Injuries that may cause this problem include:  Broken bones, especially the long bones of the arms and legs.  Crushing injuries.  Penetrating injuries, such as a knife wound that punctures the skin and tissue underneath.  Badly bruised muscles.  Poisonous bites, such as a snake bite.  Severe burns.  Blocked blood flow. This could result from:  A cast or bandage that is too tight.  A surgical procedure. Blood flow sometimes has to be stopped for a while during a surgery, usually with a tourniquet.  Lying for too long in a position that restricts  blood flow. This can happen in people who have nerve damage or if a person is unconscious for a long time.  Drugs used to build up muscles (anabolic steroids).  Drugs that keep the blood from forming clots (blood thinners). SIGNS AND SYMPTOMS  The most common symptom of compartment syndrome is pain. The pain may:   Get worse when moving or stretching the affected body part.  Be more severe than it should be for an injury.  Come along with a feeling of tingling or burning.  Become worse when the area is pushed or squeezed.  Be unaffected by pain medicine. Other symptoms include:   A feeling of tightness or fullness in the affected area.   A loss of feeling.  Weakness in the area.  Loss of movement.  Skin becoming pale, tight, and shiny over the painful area.  DIAGNOSIS  Your health care provider may suspect the problem based on how you describe the pain. The diagnosis is made by using a special device that measures the pressure in the affected area. Blood tests, X-rays, or an ultrasound exam may be done to help rule out other problems.  TREATMENT  Compartment syndrome is a surgical emergency. It should be treated very quickly.   First-aid treatment is given first. This may include:  Promptly treating an injury.  Loosening or removing any cast, bandage, or external wrap that may be causing pain.  Raising the painful arm or leg to the same level as the heart.  Giving oxygen.  Giving fluid through an IV access tube that is put into a vein in the hand or arm.  Surgery (fasciotomy) is needed to relieve the pressure and help prevent permanent damage. In this surgery, cuts (incisions) are made through the fascia to relieve the pressure in the compartment. Document Released: 11/16/2009 Document Revised: 07/31/2013 Document Reviewed: 07/02/2013 [facility] Patient Information 2015 [place], [place]. This information is not intended to replace advice given to you by your health care  provider. Make sure you discuss any questions you have with your health care provider.  Marine Life Injury Some marine animals may bite or sting (for example, jellyfish, sharks, eels, man-of-war, or sea anemones). Some may be poisonous (for example, lionfish, scorpionfish, stonefish, blue-ringed octopus, or cone shell). Others may cause wounds when you come in contact with them (sea urchin, crown of thorns, or coral). All of these injuries may be very painful. In addition to being very painful, marine life injuries can also cause very serious injuries. These can include allergic reactions or may cause anaphylactic shock. The wound may become infected from marine water, dirt, or foreign debris. You may need a tetanus shot if:  You cannot remember when you had your last tetanus shot.  You have never had a tetanus shot.  The injury broke your skin. If you got a tetanus shot, your arm may swell, get red, and feel warm to the touch. This is common and not a problem. If you need a tetanus shot and you choose not to have one, there is a rare chance of getting tetanus. Sickness from tetanus can be serious. HOME CARE INSTRUCTIONS   You may need to put your injured area under water that is as hot as you can stand without burning yourself. Only do this if your affected area is not already numb and you still have good feeling. Otherwise, you may risk burning yourself with the water.  Do not use the wounded area. For example, if your arm was stung or bitten, then refrain from use of that arm until your symptoms (pain, swelling, redness) improve.  Keep any bandages (dressings) clean and dry.  Change any dressings as told by your caregiver. If the dressing sticks, soak it in warm water. Rinse the wound and pat it dry.  Clean the wound 2 to 3 times a day with warm, soapy water.  Have your wound checked by your caregiver in 2 to 3 days to ensure good wound healing.  Have any stitches (sutures) removed as  instructed by your caregiver.  Only take medicine as instructed by your caregiver. SEEK MEDICAL CARE IF:  Your wound starts to bleed more.  You have increasing pain, redness, or swelling in the wound.  You have yellowish white fluid (pus) coming from the wound.  You have red streaks going away from the wound.  There is a bad smell coming from the wound or dressing. SEEK IMMEDIATE MEDICAL CARE IF:  You have shortness of breath or tightness in the chest.  Your tongue swells or you experience difficulty talking or swallowing.  You have raised red patches on the skin that itch.  You become dizzy, weak, or pass out.  You have a bad headache or begin to shake uncontrollably.  You have a fever and or your symptoms suddenly get worse.  You have numbness or swelling below the wound that is getting worse.  You cannot move your joint below the wound or have intense pain just moving the joint a little bit.  You feel sick to your stomach (nauseous) or throw up (vomit).  You have diarrhea. MAKE SURE YOU:  Understand these instructions.  Will watch your condition.  Will get help right away if you are not doing well or get worse. Document Released: 11/25/2000 Document Revised: 03/25/2013 Document Reviewed: 12/27/2010 [facility] Patient Information 2015 [place], [place]. This information is not intended to replace advice given to you by your health care provider. Make sure you discuss any questions you have with your health care provider.

## 2015-06-10 NOTE — Op Note (Signed)
See note [phone]

## 2015-06-10 NOTE — ED Notes (Signed)
Patient advised not to eat anything after evaluation by Dr Christy Gentles States he was using a pressure washer and the gun got away from him and caused injury

## 2015-06-10 NOTE — ED Notes (Signed)
Unable to sign discharge due to power outage

## 2015-06-10 NOTE — Anesthesia Preprocedure Evaluation (Addendum)
Anesthesia Evaluation  Patient identified by MRN, date of birth, ID band Patient awake    Reviewed: Allergy & Precautions, NPO status , Patient's Chart, lab work & pertinent test results  Airway Mallampati: II  TM Distance: >3 FB Neck ROM: Full    Dental  (+) Dental Advisory Given, Teeth Intact   Pulmonary former smoker,  breath sounds clear to auscultation        Cardiovascular hypertension, Rhythm:Regular Rate:Normal     Neuro/Psych    GI/Hepatic negative GI ROS, Neg liver ROS,   Endo/Other  negative endocrine ROS  Renal/GU negative Renal ROS     Musculoskeletal   Abdominal   Peds  Hematology   Anesthesia Other Findings   Reproductive/Obstetrics                            Anesthesia Physical Anesthesia Plan  ASA: III  Anesthesia Plan: General   Post-op Pain Management:    Induction: Intravenous, Rapid sequence and Cricoid pressure planned  Airway Management Planned: Oral ETT  Additional Equipment:   Intra-op Plan:   Post-operative Plan: Extubation in OR  Informed Consent: I have reviewed the patients History and Physical, chart, labs and discussed the procedure including the risks, benefits and alternatives for the proposed anesthesia with the patient or authorized representative who has indicated his/her understanding and acceptance.   Dental advisory given  Plan Discussed with: CRNA and Anesthesiologist  Anesthesia Plan Comments:         Anesthesia Quick Evaluation

## 2015-06-10 NOTE — Anesthesia Postprocedure Evaluation (Signed)
  Anesthesia Post-op Note  Patient: Walter Boone  Procedure(s) Performed: Procedure(s): IRRIGATION AND DEBRIDEMENT EXTREMITY LEFT HAND EXPLORATION, nerve repair (Left)  Patient Location: PACU  Anesthesia Type:General  Level of Consciousness: awake  Airway and Oxygen Therapy: Patient Spontanous Breathing  Post-op Pain: mild  Post-op Assessment: Post-op Vital signs reviewed              Post-op Vital Signs: Reviewed  Last Vitals:  Filed Vitals:   06/10/15 2300  BP: 132/70  Pulse: 87  Temp:   Resp: 17    Complications: No apparent anesthesia complications

## 2015-06-10 NOTE — ED Notes (Signed)
Advised to go to [facility] for short stay.

## 2015-06-11 ENCOUNTER — Encounter (HOSPITAL_COMMUNITY): Payer: Self-pay | Admitting: Hematology & Oncology

## 2015-06-11 ENCOUNTER — Encounter (HOSPITAL_BASED_OUTPATIENT_CLINIC_OR_DEPARTMENT_OTHER): Payer: 59

## 2015-06-11 ENCOUNTER — Encounter (HOSPITAL_BASED_OUTPATIENT_CLINIC_OR_DEPARTMENT_OTHER): Payer: 59 | Admitting: Hematology & Oncology

## 2015-06-11 VITALS — BP 121/87 | HR 73 | Temp 97.8°F | Resp 18 | Wt 216.2 lb

## 2015-06-11 DIAGNOSIS — Z5111 Encounter for antineoplastic chemotherapy: Secondary | ICD-10-CM

## 2015-06-11 DIAGNOSIS — M8580 Other specified disorders of bone density and structure, unspecified site: Secondary | ICD-10-CM

## 2015-06-11 DIAGNOSIS — C61 Malignant neoplasm of prostate: Secondary | ICD-10-CM | POA: Diagnosis not present

## 2015-06-11 MED ORDER — LEUPROLIDE ACETATE (4 MONTH) 30 MG IM KIT
30.0000 mg | PACK | Freq: Once | INTRAMUSCULAR | Status: DC
  Filled 2015-06-11: qty 30

## 2015-06-11 MED ORDER — BICALUTAMIDE 50 MG PO TABS: 50.0000 mg | ORAL_TABLET | Freq: Every day | ORAL | Status: DC

## 2015-06-11 MED ORDER — LEUPROLIDE ACETATE (3 MONTH) 22.5 MG IM KIT
22.5000 mg | PACK | Freq: Once | INTRAMUSCULAR | Status: AC
  Administered 2015-06-11: 22.5 mg via INTRAMUSCULAR
  Filled 2015-06-11: qty 22.5

## 2015-06-11 NOTE — Progress Notes (Signed)
Please see doctors encounter for more information 

## 2015-06-11 NOTE — Progress Notes (Signed)
[name], MD [address] [place] [address]    DIAGNOSIS: Prostate cancer   Staging form: Prostate, AJCC 7th Edition     Clinical: Stage IIC (pT2c, N0, M0) - Signed by Baird Cancer, PA on 06/20/2011  CURRENT THERAPY:[name] and Casodex       Osteopenia on bone density on 02/2014      Last imaging studies including bone scan, CT chest abdomen and pelvis performed in      August 2013 and negative.        INTERVAL HISTORY: Walter Boone 64 y.o. male returns for follow-up of adenocarcinoma of the prostate. The patient he was treated with definitive radiation therapy several years ago. He then developed a climb in his PSA. He was started on [name] in 2012. Casodex was added in October 2015 when his PSA climbed from 0.19 to 0.48 pg/ml. He has had problems with hot flashes and bilateral gynecomastia. He also has had problems with insomnia since starting therapy.   He is here alone today. He has trouble beginning urination in the mornings. His urination stream varies. This is unchanged and chronic. His left upper limb and hand are heavily bandaged, he was recently injured by a pressure washer. His "nerve was torn off but they put it back together". He came to the ER here at [facility] and was referred to a Copy at [facility].  He was diagnosed with shingles. It began on his right upper limb. He thought it was poison oak, which he is allergic to and that prompted him to see a doctor.  His previous severe back pain has gone away. He is not sure what caused it to stop other than time. He is greatly relieved.  He is due for his [name] shot today. He takes his Casodex pill everyday.  He is not up to date on his bone density scan.  MEDICAL HISTORY: Past Medical History  Diagnosis Date  . Prostate cancer   . Fatty liver   . Hypertension   . Hyperlipidemia   . Hemorrhoids     history  . Prostate cancer 06/20/2011  . Prostate carcinoma, recurrent  08/10/2012  . Gynecomastia, male 01/11/2013    Secondary to prostate ca Tx.   Marland Kitchen History of back injury 11/19/2013    has H N P-CERVICAL; CERVICAL SPASM; Prostate cancer; Gynecomastia, male; Chest pain; Hypertension; Hyperlipidemia; Chest pain at rest; and Osteopenia determined by x-ray on his problem list.     is allergic to tylenol.  Mr. Kabir had no medications administered during this visit.  SURGICAL HISTORY: Past Surgical History  Procedure Laterality Date  . Prostate biopsy  11/06  . Esophagogastroduodenoscopy    . Knee surgery      left  . Ankle surgery  1987    left  . Hernia repair  2010  . Colonoscopy  2008    SOCIAL HISTORY: History   Social History  . Marital Status: Married    Spouse Name: N/A  . Number of Children: N/A  . Years of Education: N/A   Occupational History  . Not on file.   Social History Main Topics  . Smoking status: Former Smoker -- 2.50 packs/day for 3 years  . Smokeless tobacco: Never Used  . Alcohol Use: No     Comment: former drinker 20 years ago  . Drug Use: No  . Sexual Activity: Not on file   Other Topics Concern  .  Not on file   Social History Narrative    FAMILY HISTORY: Family History  Problem Relation Age of Onset  . Diabetes Father   . Cancer Sister   . Diabetes Brother   . Heart failure Mother     Review of Systems  Constitutional: Negative. Heavily bandaged left upper limb, due to pressure washing incident. HENT: Negative.   Eyes: Negative.   Respiratory: Negative. Cardiovascular: Negative.   Gastrointestinal: Negative.   Genitourinary: Negative.   Musculoskeletal: Positive for back pain and joint pain.  Chronic, work related. Skin: Negative.   Neurological: Negative for dizziness, tingling, tremors, sensory change, speech change, focal weakness, seizures and loss of consciousness.  Endo/Heme/Allergies: Negative.   Psychiatric/Behavioral: Negative.     PHYSICAL EXAMINATION  ECOG PERFORMANCE  STATUS: 1 - Symptomatic but completely ambulatory  There were no vitals filed for this visit.  Physical Exam  Constitutional: He is oriented to person, place, and time and well-developed, well-nourished, and in no distress.  HENT:  Head: Normocephalic and atraumatic.  Nose: Nose normal.  Mouth/Throat: Oropharynx is clear and moist. No oropharyngeal exudate.  Eyes: Conjunctivae and EOM are normal. Pupils are equal, round, and reactive to light. Right eye exhibits no discharge. Left eye exhibits no discharge. No scleral icterus.  Neck: Normal range of motion. Neck supple. No tracheal deviation present. No thyromegaly present.  Cardiovascular: Normal rate, regular rhythm and normal heart sounds.  Exam reveals no gallop and no friction rub.   No murmur heard. Pulmonary/Chest: Effort normal and breath sounds normal. He has no wheezes. He has no rales.  Abdominal: Soft. Bowel sounds are normal. He exhibits no distension and no mass. There is no tenderness. There is no rebound and no guarding.  Musculoskeletal: Normal range of motion. He exhibits no edema.  Lymphadenopathy:    He has no cervical adenopathy.  Neurological: He is alert and oriented to person, place, and time. He has normal reflexes. No cranial nerve deficit. Gait normal. Coordination normal.  Skin: Skin is warm and dry. No rash noted.  Psychiatric: Mood, memory, affect and judgment normal.  Nursing note and vitals reviewed.   LABORATORY DATA:  CBC    Component Value Date/Time   WBC 15.3* 06/10/2015 1901   RBC 4.69 06/10/2015 1901   HGB 14.8 06/10/2015 1901   HCT 43.1 06/10/2015 1901   PLT 391 06/10/2015 1901   MCV 91.9 06/10/2015 1901   MCH 31.6 06/10/2015 1901   MCHC 34.3 06/10/2015 1901   RDW 13.1 06/10/2015 1901   LYMPHSABS 2.8 06/10/2015 1901   MONOABS 0.7 06/10/2015 1901   EOSABS 0.2 06/10/2015 1901   BASOSABS 0.0 06/10/2015 1901   CMP     Component Value Date/Time   NA 137 06/10/2015 1901   K 3.3*  06/10/2015 1901   CL 103 06/10/2015 1901   CO2 22 06/10/2015 1901   GLUCOSE 131* 06/10/2015 1901   BUN 34* 06/10/2015 1901   CREATININE 1.15 06/10/2015 1901   CALCIUM 9.7 06/10/2015 1901   PROT 7.4 05/20/2015 0949   ALBUMIN 4.2 05/20/2015 0949   AST 22 05/20/2015 0949   ALT 21 05/20/2015 0949   ALKPHOS 54 05/20/2015 0949   BILITOT 0.6 05/20/2015 0949   GFRNONAA >60 06/10/2015 1901   GFRAA >60 06/10/2015 1901    ASSESSMENT and THERAPY PLAN:   Adenocarcinoma of prostate, biochemical recurrence [name] and casodex  He is on [name] given every 4 months. He would like to go back to every 3 month dosing as  he states he "felt better." on 3 month dosing. I advised him that that is certainly not a problem. His PSA fluctuates and will need ongoing close observation. He continues on Casodex. We will tentatively see him back in 3 months with repeat laboratory studies including PSA. I will change his [name] to every 3 month dosing. He is encouraged to take calcium and vitamin D for his bone health. I have ordered a DEXA.  Refilled his Casodex. Follow up in 3 months with a [name] shot, physican exam and repeat labs including PSA.  All questions were answered. The patient knows to call the clinic with any problems, questions or concerns. We can certainly see the patient much sooner if necessary.  This document serves as a record of services personally performed by Ancil Linsey, MD. It was created on her behalf by Arlyce Harman, a trained medical scribe. The creation of this record is based on the scribe's personal observations and the provider's statements to them. This document has been checked and approved by the attending provider.  I have reviewed the above documentation for accuracy and completeness, and I agree with the above. Molli Hazard, MD

## 2015-06-11 NOTE — Op Note (Signed)
Walter Boone, Walter Boone            ACCOUNT NO.:  [id number]  MEDICAL RECORD NO.:  [id number]  LOCATION:  [facility]                         FACILITY:  [facility]  PHYSICIAN:  Sheral Apley. Khristine Verno, M.D.DATE OF BIRTH:  [birth year]  DATE OF PROCEDURE:  06/10/2015 DATE OF DISCHARGE:  06/10/2015                              OPERATIVE REPORT   PREOPERATIVE DIAGNOSIS:  Left hand injection injury.  POSTOPERATIVE DIAGNOSIS:  Left hand injection injury.  PROCEDURE:  Incision and drainage of above with microscopic repair of ulnar digital nerve, ring finger of left side.  SURGEON:  Sheral Apley. Burney Gauze, M.D.  ASSISTANT:  None.  ANESTHESIA:  General.  COMPLICATIONS:  No complications.  DRAINS:  No drains.  DESCRIPTION OF PROCEDURE:  The patient was taken to the operating suite. After induction of adequate general anesthetic, left upper extremity was prepped and draped in sterile fashion.  Esmarch was used to exsanguinate the limb.  Tourniquet was inflated to 215 mmHg.  At this point in time, injection injury using the power washer with a laceration on the ulnar side of the A1 pulley, air was extended to the distal palmar crease and then in a Brunner fashion across the ring, proximal phalanx to the level of the PIP joint.  Skin flaps were raised.  The radial neurovascular was intact.  The ulnar neurovascular bundle had continuity of the artery, but 90% laceration to the ulnar digital nerve.  We took 3 liters of normal saline to thoroughly irrigate any of the fluid that had been injected.  There was no evidence of involvement of the flexor sheath. After the 3 liters of irrigant under microscopic magnification, repaired the ulnar digital nerve with 9-0 nylon x3 sutures.  We then closed loosely with 4-0 nylon.  Xeroform, 4x4s, and a compressive dressing and dorsal extension block and splint were applied.  The patient tolerated the procedure well and went to the recovery room in stable  fashion.     Sheral Apley Burney Gauze, M.D.    MAW/MEDQ  D:  06/10/2015  T:  06/11/2015  Job:  [id number]

## 2015-06-11 NOTE — Patient Instructions (Signed)
..  [place] at [facility] Discharge Instructions  RECOMMENDATIONS MADE BY THE CONSULTANT AND ANY TEST RESULTS WILL BE SENT TO YOUR REFERRING PHYSICIAN.  [name] today and every 3 months Labs every 3 months Return in 3 months  Thank you for choosing [place] at [facility] to provide your oncology and hematology care.  To afford each patient quality time with our provider, please arrive at least 15 minutes before your scheduled appointment time.    You need to re-schedule your appointment should you arrive 10 or more minutes late.  We strive to give you quality time with our providers, and arriving late affects you and other patients whose appointments are after yours.  Also, if you no show three or more times for appointments you may be dismissed from the clinic at the providers discretion.     Again, thank you for choosing [facility].  Our hope is that these requests will decrease the amount of time that you wait before being seen by our physicians.       _____________________________________________________________  Should you have questions after your visit to [facility], please contact our office at [phone] between the hours of 8:30 a.m. and 4:30 p.m.  Voicemails left after 4:30 p.m. will not be returned until the following business day.  For prescription refill requests, have your pharmacy contact our office.

## 2015-06-11 NOTE — Progress Notes (Signed)
..  Walter Boone presents today for injection per the provider's orders.  lupron 22.5 mg administration without incident; see MAR for injection details.  Patient tolerated procedure well and without incident.  No questions or complaints noted at this time.

## 2015-06-12 LAB — PSA: PSA: 0.5 ng/mL (ref 0.00–4.00)

## 2015-06-16 ENCOUNTER — Ambulatory Visit (INDEPENDENT_AMBULATORY_CARE_PROVIDER_SITE_OTHER): Payer: 59 | Admitting: Family Medicine

## 2015-06-16 ENCOUNTER — Encounter: Payer: Self-pay | Admitting: Family Medicine

## 2015-06-16 VITALS — BP 145/96 | HR 91 | Temp 97.0°F | Ht 67.0 in | Wt 222.0 lb

## 2015-06-16 DIAGNOSIS — M79642 Pain in left hand: Secondary | ICD-10-CM

## 2015-06-16 DIAGNOSIS — S6992XD Unspecified injury of left wrist, hand and finger(s), subsequent encounter: Secondary | ICD-10-CM

## 2015-06-16 MED ORDER — OXYCODONE-ACETAMINOPHEN 5-325 MG PO TABS: 1.0000 | ORAL_TABLET | ORAL | Status: DC | PRN

## 2015-06-16 NOTE — Patient Instructions (Signed)
Take pain medication as directed Follow-up with hand surgeon as planned Continue to see Dr. Luan Pulling for routine follow-up

## 2015-06-16 NOTE — Progress Notes (Signed)
Subjective:    [name], male    DOB: 08-15-1951, 64 y.o.   MRN: [id number]  HPI Patient here today for New patient visit for a ortho referral to follow up on recent left hand surgery. The patient was using a pressure washer and it slipped and tore the palmar aspect of the left hand along with damaging the fourth finger of that hand. The patient has already had surgery. He already has a return appointment with the hand surgeon but he just needs a referral which we will be glad to do. He also prefers to continue to see Dr. Luan Pulling because he is been followed by him for over 20 years and he knows him very well and we will also do a referral back to him for continued follow-up. The patient's blood pressure is slightly elevated today and he is obviously still in a lot of pain in his hand.        Patient Active Problem List   Diagnosis Date Noted  . Osteopenia determined by x-ray 02/01/2015  . Chest pain 04/02/2014  . Chest pain at rest 04/02/2014  . Hypertension   . Hyperlipidemia   . Gynecomastia, male 01/11/2013  . Prostate cancer 06/20/2011  . H N P-CERVICAL 10/05/2009  . CERVICAL SPASM 10/05/2009   Outpatient Encounter Prescriptions as of 06/16/2015  Medication Sig  . bicalutamide (CASODEX) 50 MG tablet Take 1 tablet (50 mg total) by mouth daily.  . divalproex (DEPAKOTE) 250 MG DR tablet 2 tabs my mouth AM and 3 tabs PM  . hydrochlorothiazide (HYDRODIURIL) 25 MG tablet Take 25 mg by mouth daily.  Marland Kitchen HYDROcodone-acetaminophen (NORCO/VICODIN) 5-325 MG per tablet Take 1 tablet by mouth every 4 (four) hours as needed for moderate pain.  Marland Kitchen Leuprolide Acetate ([name] IJ) Inject as directed every 3 (three) months.  . levETIRAcetam (KEPPRA) 500 MG tablet Take 1 tablet (500 mg total) by mouth 2 (two) times daily.  Marland Kitchen oxyCODONE-acetaminophen (ROXICET) 5-325 MG per tablet Take 1 tablet by mouth every 4 (four) hours as needed for severe pain.  . predniSONE (DELTASONE) 10 MG  tablet Take 2 tablets (20 mg total) by mouth 2 (two) times daily.  . propranolol (INDERAL) 20 MG tablet Take 1 tablet (20 mg total) by mouth 3 (three) times daily.  . traMADol (ULTRAM) 50 MG tablet Take 50 mg by mouth every 6 (six) hours as needed.   No facility-administered encounter medications on file as of 06/16/2015.     Review of Systems  Constitutional: Negative.   HENT: Negative.   Eyes: Negative.   Respiratory: Negative.   Cardiovascular: Negative.   Gastrointestinal: Negative.   Endocrine: Negative.   Genitourinary: Negative.   Musculoskeletal: Positive for arthralgias (left hand pain - recent surg.).  Skin: Negative.   Allergic/Immunologic: Negative.   Neurological: Negative.   Hematological: Negative.   Psychiatric/Behavioral: Negative.        Objective:   Physical Exam  Constitutional: He is oriented to person, place, and time. He appears well-developed and well-nourished.  Musculoskeletal: He exhibits edema and tenderness.  The patient has a heavy dressing on the left hand secondary to the most recent surgical event by the hand surgeon. This was not undressed and the patient had an elevated in the room  Neurological: He is alert and oriented to person, place, and time.  Skin: Skin is warm and dry.  Wound left hand with heavy dressing not visualized today.  Psychiatric: He has a normal mood and  affect. His behavior is normal. Judgment and thought content normal.  Nursing note and vitals reviewed.  BP 145/96 mmHg  Pulse 91  Temp(Src) 97 F (36.1 C) (Oral)  Ht 5\' 7"  (1.702 m)  Wt 222 lb (100.699 kg)  BMI 34.76 kg/m2        Assessment & Plan:  1. Hand pain, left -'s prescription for pain pills enough to do until he sees the hand surgeon - Ambulatory referral to Orthopedic Surgery  2. Hand trauma, left, subsequent encounter -Follow up with hand surgeon as planned  Meds ordered this encounter  Medications  . oxyCODONE-acetaminophen (ROXICET) 5-325 MG  per tablet    Sig: Take 1 tablet by mouth every 4 (four) hours as needed for severe pain.    Dispense:  20 tablet    Refill:  0   Patient Instructions  Take pain medication as directed Follow-up with hand surgeon as planned Continue to see Dr. Luan Pulling for routine follow-up   Arrie Senate MD

## 2015-06-26 ENCOUNTER — Telehealth: Payer: Self-pay | Admitting: Family Medicine

## 2015-06-26 DIAGNOSIS — Z0000 Encounter for general adult medical examination without abnormal findings: Secondary | ICD-10-CM

## 2015-07-09 NOTE — Telephone Encounter (Signed)
Not sure what this is about; Why would pt need a referral to his PCP?

## 2015-07-09 NOTE — Telephone Encounter (Signed)
Please make sure that this referral gets done.

## 2015-09-09 ENCOUNTER — Encounter (HOSPITAL_COMMUNITY): Payer: Medicaid Other | Attending: Hematology & Oncology

## 2015-09-09 DIAGNOSIS — C61 Malignant neoplasm of prostate: Secondary | ICD-10-CM

## 2015-09-09 DIAGNOSIS — M8580 Other specified disorders of bone density and structure, unspecified site: Secondary | ICD-10-CM

## 2015-09-09 LAB — CBC WITH DIFFERENTIAL/PLATELET
BASOS PCT: 1 %
Basophils Absolute: 0.1 10*3/uL (ref 0.0–0.1)
Eosinophils Absolute: 0.3 10*3/uL (ref 0.0–0.7)
Eosinophils Relative: 4 %
HEMATOCRIT: 41 % (ref 39.0–52.0)
Hemoglobin: 14.1 g/dL (ref 13.0–17.0)
LYMPHS ABS: 2.4 10*3/uL (ref 0.7–4.0)
Lymphocytes Relative: 31 %
MCH: 31.6 pg (ref 26.0–34.0)
MCHC: 34.4 g/dL (ref 30.0–36.0)
MCV: 91.9 fL (ref 78.0–100.0)
MONO ABS: 0.4 10*3/uL (ref 0.1–1.0)
MONOS PCT: 6 %
NEUTROS ABS: 4.5 10*3/uL (ref 1.7–7.7)
Neutrophils Relative %: 58 %
Platelets: 363 10*3/uL (ref 150–400)
RBC: 4.46 MIL/uL (ref 4.22–5.81)
RDW: 12.6 % (ref 11.5–15.5)
WBC: 7.7 10*3/uL (ref 4.0–10.5)

## 2015-09-09 LAB — COMPREHENSIVE METABOLIC PANEL
ALK PHOS: 55 U/L (ref 38–126)
ALT: 22 U/L (ref 17–63)
AST: 22 U/L (ref 15–41)
Albumin: 4.4 g/dL (ref 3.5–5.0)
Anion gap: 8 (ref 5–15)
BUN: 26 mg/dL — AB (ref 6–20)
CO2: 25 mmol/L (ref 22–32)
CREATININE: 0.92 mg/dL (ref 0.61–1.24)
Calcium: 8.7 mg/dL — ABNORMAL LOW (ref 8.9–10.3)
Chloride: 105 mmol/L (ref 101–111)
GFR calc Af Amer: >60 mL/min (ref >60)
GFR calc non Af Amer: >60 mL/min (ref >60)
Glucose, Bld: 153 mg/dL — ABNORMAL HIGH (ref 65–99)
Potassium: 3.3 mmol/L — ABNORMAL LOW (ref 3.5–5.1)
SODIUM: 138 mmol/L (ref 135–145)
Total Bilirubin: 0.4 mg/dL (ref 0.3–1.2)
Total Protein: 7.6 g/dL (ref 6.5–8.1)

## 2015-09-09 LAB — PSA: PSA: 0.4 ng/mL (ref 0.00–4.00)

## 2015-09-09 NOTE — Progress Notes (Signed)
Labs drawn

## 2015-09-11 ENCOUNTER — Encounter (HOSPITAL_COMMUNITY): Payer: Self-pay | Admitting: Hematology & Oncology

## 2015-09-11 ENCOUNTER — Encounter (HOSPITAL_BASED_OUTPATIENT_CLINIC_OR_DEPARTMENT_OTHER): Payer: Medicaid Other | Admitting: Hematology & Oncology

## 2015-09-11 ENCOUNTER — Encounter (HOSPITAL_COMMUNITY): Payer: Medicaid Other

## 2015-09-11 VITALS — BP 146/90 | HR 85 | Temp 98.3°F | Resp 18 | Wt 214.0 lb

## 2015-09-11 DIAGNOSIS — C61 Malignant neoplasm of prostate: Secondary | ICD-10-CM

## 2015-09-11 DIAGNOSIS — Z5111 Encounter for antineoplastic chemotherapy: Secondary | ICD-10-CM

## 2015-09-11 MED ORDER — LEUPROLIDE ACETATE (3 MONTH) 22.5 MG IM KIT
22.5000 mg | PACK | Freq: Once | INTRAMUSCULAR | Status: AC
  Administered 2015-09-11: 22.5 mg via INTRAMUSCULAR
  Filled 2015-09-11: qty 22.5

## 2015-09-11 NOTE — Patient Instructions (Signed)
[place] at [facility] Discharge Instructions  RECOMMENDATIONS MADE BY THE CONSULTANT AND ANY TEST RESULTS WILL BE SENT TO YOUR REFERRING PHYSICIAN.  Exam and discussion by Dr. Whitney Muse. Discussed with Financial Counselor need for assistance with Casodex and she will see if there's anything available. [name] injection today Call with concerns  Follow-up in 3 months with labs, office visit and lupron injection.  Thank you for choosing [place] at [facility] to provide your oncology and hematology care.  To afford each patient quality time with our Stepanie Graver, please arrive at least 15 minutes before your scheduled appointment time.    You need to re-schedule your appointment should you arrive 10 or more minutes late.  We strive to give you quality time with our providers, and arriving late affects you and other patients whose appointments are after yours.  Also, if you no show three or more times for appointments you may be dismissed from the clinic at the providers discretion.     Again, thank you for choosing [facility].  Our hope is that these requests will decrease the amount of time that you wait before being seen by our physicians.       _____________________________________________________________  Should you have questions after your visit to [facility], please contact our office at [phone] between the hours of 8:30 a.m. and 4:30 p.m.  Voicemails left after 4:30 p.m. will not be returned until the following business day.  For prescription refill requests, have your pharmacy contact our office.

## 2015-09-11 NOTE — Progress Notes (Signed)
Walter Boone, Walter Boone [id number]   DIAGNOSIS: Prostate cancer   Staging form: Prostate, AJCC 7th Edition     Clinical: Stage IIC (pT2c, N0, M0) - Signed by Baird Cancer, PA on 06/20/2011  CURRENT THERAPY:[name] and Casodex       Osteopenia on bone density on 02/2014      Last imaging studies including bone scan, CT chest abdomen and pelvis performed in      August 2013 and negative.        INTERVAL HISTORY: Walter Boone 64 y.o. male returns for follow-up of adenocarcinoma of the prostate. The patient he was treated with definitive radiation therapy several years ago. He then developed a climb in his PSA. He was started on [name] in 2012. Casodex was added in October 2015 when his PSA climbed from 0.19 to 0.48 pg/ml. He has had problems with hot flashes and bilateral gynecomastia. He also has had problems with insomnia since starting therapy.   Walter Boone is here alone today. He is due for his [name] shot.  He confirms that he is eating okay.  He denies taking potassium pills, but says he believes he was taking them at one time. Walter Boone also mentions that he has trouble sleeping because he is too hot.   He says that his back is still messing him up, but it's from the injury he had at work.  He expresses that he has been unable to afford any of his medications lately, because he does not have insurance. He is waiting to hear back from the lady who said she was going to put him on full medicaid. He is very worried that his insurance isn't going to work out, and says that he "gets depressed sometimes" at the thought that maybe he won't be able to treat his cancer due to his lack of insurance. He remarks that it is "really getting him down" with his wife not working, and with him only receiving his disability check. He says he would "rather still be working than be in the place he's at." He thinks that they did him wrong at work after he  fell, had all of the procedures done on his back, and then they fired him. He is extremely distressed by his current state of affairs with regards to his health, financial issues, and lack of insurance.  He denies new pain, decreased appetite or change in his baseline health.    MEDICAL HISTORY: Past Medical History  Diagnosis Date  . Prostate cancer   . Fatty liver   . Hypertension   . Hyperlipidemia   . Hemorrhoids     history  . Prostate cancer 06/20/2011  . Prostate carcinoma, recurrent 08/10/2012  . Gynecomastia, male 01/11/2013    Secondary to prostate ca Tx.   Marland Kitchen History of back injury 11/19/2013    has H N P-CERVICAL; CERVICAL SPASM; Prostate cancer; Gynecomastia, male; Chest pain; Hypertension; Hyperlipidemia; Chest pain at rest; and Osteopenia determined by x-ray on his problem list.     is allergic to tylenol.  Walter Boone.  SURGICAL HISTORY: Past Surgical History  Procedure Laterality Date  . Prostate biopsy  11/06  . Esophagogastroduodenoscopy    . Knee surgery      left  . Ankle surgery  1987    left  . Hernia repair  2010  . Colonoscopy  2008  . I&d  extremity Left 06/10/2015    Procedure: IRRIGATION AND DEBRIDEMENT EXTREMITY LEFT HAND EXPLORATION, nerve repair;  Surgeon: Charlotte Crumb, MD;  Location: [facility];  Service: Orthopedics;  Laterality: Left;    SOCIAL HISTORY: Social History   Social History  . Marital Status: Married    Spouse Name: N/A  . Number of Children: N/A  . Years of Education: N/A   Occupational History  . Not on file.   Social History Main Topics  . Smoking status: Former Smoker -- 2.50 packs/day for 3 years  . Smokeless tobacco: Never Used  . Alcohol Use: No     Comment: former drinker 20 years ago  . Drug Use: No  . Sexual Activity: Not on file   Other Topics Concern  . Not on file   Social History Narrative    FAMILY HISTORY: Family History  Problem Relation Age of  Onset  . Diabetes Father   . Cancer Sister   . Diabetes Brother   . Heart failure Mother     Review of Systems  Constitutional: Negative. HENT: Negative.   Eyes: Negative.   Respiratory: Negative. Cardiovascular: Negative.   Gastrointestinal: Negative.   Genitourinary: Negative.   Musculoskeletal: Positive for back pain and joint pain.  Chronic, work related. Skin: Negative.   Neurological: Negative for dizziness, tingling, tremors, sensory change, speech change, focal weakness, seizures and loss of consciousness.  Endo/Heme/Allergies: Negative.   Psychiatric/Behavioral: Negative.   14 point review of systems was performed and is negative except as detailed under history of present illness and above   PHYSICAL EXAMINATION  ECOG PERFORMANCE STATUS: 1 - Symptomatic but completely ambulatory  Filed Vitals:   09/11/15 1035  BP: 146/90  Pulse: 85  Temp: 98.3 F (36.8 C)  Resp: 18    Physical Exam  Constitutional: He is oriented to person, place, and time and well-developed, well-nourished, and in no distress.  Large lipoma on the back of his neck. HENT:  Head: Normocephalic and atraumatic.  Nose: Nose normal.  Mouth/Throat: Oropharynx is clear and moist. No oropharyngeal exudate.  Eyes: Conjunctivae and EOM are normal. Pupils are equal, round, and reactive to light. Right eye exhibits no discharge. Left eye exhibits no discharge. No scleral icterus.  Neck: Normal range of motion. Neck supple. No tracheal deviation present. No thyromegaly present.  Cardiovascular: Normal rate, regular rhythm and normal heart sounds.  Exam reveals no gallop and no friction rub.   No murmur heard. Pulmonary/Chest: Effort normal and breath sounds normal. He has no wheezes. He has no rales.  Abdominal: Soft. Bowel sounds are normal. He exhibits no distension and no mass. There is no tenderness. There is no rebound and no guarding.  Musculoskeletal: Normal range of motion. He exhibits no edema.   Lymphadenopathy:    He has no cervical adenopathy.  Neurological: He is alert and oriented to person, place, and time. He has normal reflexes. No cranial nerve deficit. Gait normal. Coordination normal.  Skin: Skin is warm and dry. No rash noted.  Psychiatric: Mood, memory, affect and judgment normal.  Nursing note and vitals reviewed.   LABORATORY DATA: I have reviewed the data as listed.  CBC    Component Value Date/Time   WBC 7.7 09/09/2015 0945   RBC 4.46 09/09/2015 0945   HGB 14.1 09/09/2015 0945   HCT 41.0 09/09/2015 0945   PLT 363 09/09/2015 0945   MCV 91.9 09/09/2015 0945   MCH 31.6 09/09/2015 0945   MCHC 34.4 09/09/2015 0945  RDW 12.6 09/09/2015 0945   LYMPHSABS 2.4 09/09/2015 0945   MONOABS 0.4 09/09/2015 0945   EOSABS 0.3 09/09/2015 0945   BASOSABS 0.1 09/09/2015 0945   CMP     Component Value Date/Time   NA 138 09/09/2015 0945   K 3.3* 09/09/2015 0945   CL 105 09/09/2015 0945   CO2 25 09/09/2015 0945   GLUCOSE 153* 09/09/2015 0945   BUN 26* 09/09/2015 0945   CREATININE 0.92 09/09/2015 0945   CALCIUM 8.7* 09/09/2015 0945   PROT 7.6 09/09/2015 0945   ALBUMIN 4.4 09/09/2015 0945   AST 22 09/09/2015 0945   ALT 22 09/09/2015 0945   ALKPHOS 55 09/09/2015 0945   BILITOT 0.4 09/09/2015 0945   GFRNONAA >60 09/09/2015 0945   GFRAA >60 09/09/2015 0945    ASSESSMENT and THERAPY PLAN:   Adenocarcinoma of prostate, biochemical recurrence [name] and casodex hypokalemia  He is on [name] given every 3 months.  His PSA fluctuates and will need ongoing close observation. We will have Angie check to see if there is any assistance for his casodex. We will tentatively see him back in 3 months with repeat laboratory studies including PSA. He is encouraged to take calcium and vitamin D for his bone health. I have instructed him to restart his potassium that he has at home.  He does not have insurance. He was told that he was going to be placed on Medicaid, and feels  that once that comes through, he will be able to afford his medications.   I advised the patient to let me know about his insurance, and if needed we can try to find cheaper substitutions for some of his current medications.  We will follow up with Walter Boone in 3 months with labs and [name].  Orders Placed This Encounter  Procedures  . CBC with Differential    Standing Status: Future     Number of Occurrences:      Standing Expiration Date: 09/10/2016  . Comprehensive metabolic panel    Standing Status: Future     Number of Occurrences:      Standing Expiration Date: 09/10/2016  . PSA    Standing Status: Future     Number of Occurrences:      Standing Expiration Date: 09/10/2016  . PHYSICIAN COMMUNICATION ORDER    Screen for diabetes and CV risk prior to initiating therapy and periodically.  Marland Kitchen SCHEDULING COMMUNICATION INJECTION    15 minute injection every 6 months    All questions were answered. The patient knows to call the clinic with any problems, questions or concerns. We can certainly see the patient much sooner if necessary.  This document serves as a record of services personally performed by Ancil Linsey, MD. It was created on her behalf by Toni Amend, a trained medical scribe. The creation of this record is based on the scribe's personal observations and the provider's statements to them. This document has been checked and approved by the attending provider.  I have reviewed the above documentation for accuracy and completeness, and I agree with the above. Molli Hazard, MD

## 2015-09-11 NOTE — Progress Notes (Signed)
See office visit encounter. 

## 2015-09-11 NOTE — Progress Notes (Signed)
Walter Boone presents today for injection per MD orders. [name] 22.5mg  administered IM Ztrack right gluteal. Administration without incident. Patient tolerated well.

## 2015-10-30 ENCOUNTER — Emergency Department (HOSPITAL_COMMUNITY)
Admission: EM | Admit: 2015-10-30 | Discharge: 2015-10-30 | Disposition: A | Discharge status: Home / Self Care | Payer: Medicaid Other | Attending: Emergency Medicine | Admitting: Emergency Medicine

## 2015-10-30 ENCOUNTER — Encounter (HOSPITAL_COMMUNITY): Payer: Self-pay | Admitting: Emergency Medicine

## 2015-10-30 ENCOUNTER — Emergency Department (HOSPITAL_COMMUNITY): Payer: Medicaid Other

## 2015-10-30 DIAGNOSIS — I10 Essential (primary) hypertension: Secondary | ICD-10-CM | POA: Insufficient documentation

## 2015-10-30 DIAGNOSIS — Z87438 Personal history of other diseases of male genital organs: Secondary | ICD-10-CM | POA: Insufficient documentation

## 2015-10-30 DIAGNOSIS — Z8719 Personal history of other diseases of the digestive system: Secondary | ICD-10-CM | POA: Diagnosis not present

## 2015-10-30 DIAGNOSIS — G510 Bell's palsy: Secondary | ICD-10-CM

## 2015-10-30 DIAGNOSIS — Z7982 Long term (current) use of aspirin: Secondary | ICD-10-CM | POA: Insufficient documentation

## 2015-10-30 DIAGNOSIS — Z87828 Personal history of other (healed) physical injury and trauma: Secondary | ICD-10-CM | POA: Diagnosis not present

## 2015-10-30 DIAGNOSIS — Z87891 Personal history of nicotine dependence: Secondary | ICD-10-CM | POA: Diagnosis not present

## 2015-10-30 DIAGNOSIS — Z8639 Personal history of other endocrine, nutritional and metabolic disease: Secondary | ICD-10-CM | POA: Diagnosis not present

## 2015-10-30 DIAGNOSIS — Z79899 Other long term (current) drug therapy: Secondary | ICD-10-CM | POA: Diagnosis not present

## 2015-10-30 DIAGNOSIS — Z8546 Personal history of malignant neoplasm of prostate: Secondary | ICD-10-CM | POA: Diagnosis not present

## 2015-10-30 DIAGNOSIS — R29810 Facial weakness: Secondary | ICD-10-CM | POA: Diagnosis present

## 2015-10-30 DIAGNOSIS — F1110 Opioid abuse, uncomplicated: Secondary | ICD-10-CM | POA: Diagnosis not present

## 2015-10-30 LAB — I-STAT CHEM 8, ED
BUN: 24 mg/dL — AB (ref 6–20)
Calcium, Ion: 1.19 mmol/L (ref 1.13–1.30)
Chloride: 102 mmol/L (ref 101–111)
Creatinine, Ser: 1.1 mg/dL (ref 0.61–1.24)
Glucose, Bld: 127 mg/dL — ABNORMAL HIGH (ref 65–99)
HEMATOCRIT: 38 % — AB (ref 39.0–52.0)
HEMOGLOBIN: 12.9 g/dL — AB (ref 13.0–17.0)
POTASSIUM: 3 mmol/L — AB (ref 3.5–5.1)
SODIUM: 143 mmol/L (ref 135–145)
TCO2: 25 mmol/L (ref 0–100)

## 2015-10-30 LAB — I-STAT TROPONIN, ED: Troponin i, poc: 0 ng/mL (ref 0.00–0.08)

## 2015-10-30 LAB — URINALYSIS, ROUTINE W REFLEX MICROSCOPIC
BILIRUBIN URINE: NEGATIVE
GLUCOSE, UA: NEGATIVE mg/dL
Hgb urine dipstick: NEGATIVE
KETONES UR: NEGATIVE mg/dL
LEUKOCYTES UA: NEGATIVE
Nitrite: NEGATIVE
PH: 5 (ref 5.0–8.0)
PROTEIN: NEGATIVE mg/dL
Specific Gravity, Urine: >1.03 — ABNORMAL HIGH (ref 1.005–1.030)

## 2015-10-30 LAB — DIFFERENTIAL
BASOS PCT: 1 %
Basophils Absolute: 0 10*3/uL (ref 0.0–0.1)
EOS ABS: 0.4 10*3/uL (ref 0.0–0.7)
EOS PCT: 4 %
Lymphocytes Relative: 31 %
Lymphs Abs: 2.7 10*3/uL (ref 0.7–4.0)
Monocytes Absolute: 0.5 10*3/uL (ref 0.1–1.0)
Monocytes Relative: 5 %
NEUTROS PCT: 59 %
Neutro Abs: 5.1 10*3/uL (ref 1.7–7.7)

## 2015-10-30 LAB — CBC
HCT: 36.8 % — ABNORMAL LOW (ref 39.0–52.0)
Hemoglobin: 13 g/dL (ref 13.0–17.0)
MCH: 32.1 pg (ref 26.0–34.0)
MCHC: 35.3 g/dL (ref 30.0–36.0)
MCV: 90.9 fL (ref 78.0–100.0)
PLATELETS: 352 10*3/uL (ref 150–400)
RBC: 4.05 MIL/uL — ABNORMAL LOW (ref 4.22–5.81)
RDW: 12.7 % (ref 11.5–15.5)
WBC: 8.7 10*3/uL (ref 4.0–10.5)

## 2015-10-30 LAB — COMPREHENSIVE METABOLIC PANEL
ALBUMIN: 4.5 g/dL (ref 3.5–5.0)
ALT: 23 U/L (ref 17–63)
ANION GAP: 10 (ref 5–15)
AST: 30 U/L (ref 15–41)
Alkaline Phosphatase: 55 U/L (ref 38–126)
BILIRUBIN TOTAL: 0.5 mg/dL (ref 0.3–1.2)
BUN: 27 mg/dL — ABNORMAL HIGH (ref 6–20)
CO2: 26 mmol/L (ref 22–32)
Calcium: 9.7 mg/dL (ref 8.9–10.3)
Chloride: 105 mmol/L (ref 101–111)
Creatinine, Ser: 1.05 mg/dL (ref 0.61–1.24)
GFR calc Af Amer: >60 mL/min (ref >60)
GFR calc non Af Amer: >60 mL/min (ref >60)
GLUCOSE: 165 mg/dL — AB (ref 65–99)
POTASSIUM: 3.3 mmol/L — AB (ref 3.5–5.1)
SODIUM: 141 mmol/L (ref 135–145)
TOTAL PROTEIN: 7.8 g/dL (ref 6.5–8.1)

## 2015-10-30 LAB — PROTIME-INR
INR: 1.01 (ref 0.00–1.49)
PROTHROMBIN TIME: 13.5 s (ref 11.6–15.2)

## 2015-10-30 LAB — RAPID URINE DRUG SCREEN, HOSP PERFORMED
Amphetamines: NOT DETECTED
Barbiturates: NOT DETECTED
Benzodiazepines: NOT DETECTED
COCAINE: NOT DETECTED
OPIATES: POSITIVE — AB
TETRAHYDROCANNABINOL: NOT DETECTED

## 2015-10-30 LAB — CBG MONITORING, ED: GLUCOSE-CAPILLARY: 197 mg/dL — AB (ref 65–99)

## 2015-10-30 LAB — APTT: aPTT: 26 seconds (ref 24–37)

## 2015-10-30 LAB — ETHANOL: Alcohol, Ethyl (B): <5 mg/dL (ref <5)

## 2015-10-30 MED ORDER — ARTIFICIAL TEARS OP OINT: TOPICAL_OINTMENT | OPHTHALMIC | Status: DC | PRN

## 2015-10-30 MED ORDER — VALACYCLOVIR HCL 500 MG PO TABS
1000.0000 mg | ORAL_TABLET | Freq: Once | ORAL | Status: AC
  Administered 2015-10-30: 1000 mg via ORAL
  Filled 2015-10-30: qty 2

## 2015-10-30 MED ORDER — POTASSIUM CHLORIDE CRYS ER 20 MEQ PO TBCR
40.0000 meq | EXTENDED_RELEASE_TABLET | Freq: Once | ORAL | Status: AC
  Administered 2015-10-30: 40 meq via ORAL
  Filled 2015-10-30: qty 2

## 2015-10-30 MED ORDER — VALACYCLOVIR HCL 1 G PO TABS: 1000.0000 mg | ORAL_TABLET | Freq: Three times a day (TID) | ORAL | Status: DC

## 2015-10-30 MED ORDER — PREDNISONE 10 MG PO TABS: ORAL_TABLET | ORAL | Status: DC

## 2015-10-30 MED ORDER — PREDNISONE 50 MG PO TABS
60.0000 mg | ORAL_TABLET | Freq: Once | ORAL | Status: AC
  Administered 2015-10-30: 60 mg via ORAL
  Filled 2015-10-30: qty 1

## 2015-10-30 NOTE — ED Provider Notes (Signed)
CSN: [id number]     Arrival date & time 10/30/15  1547 History   First MD Initiated Contact with Patient 10/30/15 1551     Chief Complaint  Patient presents with  . Facial Droop      HPI Pt was seen at 1620. Per pt, c/o unknown onset and persistence of constant left facial droop since "sometime yesterday." Pt states since yesterday, he has been unable to close his left eye and been "drooling" out of the left corner of his mouth. Pt also states his tongue "is a little numb." Denies no visual changes, no focal motor weakness, no tingling/numbness in extremities, no ataxia, no slurred speech, no CP/SOB, no abd pain, no N/V/D, no injury, no headache, no neck pain.     Past Medical History  Diagnosis Date  . Prostate cancer ([place])   . Fatty liver   . Hypertension   . Hyperlipidemia   . Hemorrhoids     history  . Prostate cancer ([place]) 06/20/2011  . Prostate carcinoma, recurrent ([place]) 08/10/2012  . Gynecomastia, male 01/11/2013    Secondary to prostate ca Tx.   Marland Kitchen History of back injury 11/19/2013   Past Surgical History  Procedure Laterality Date  . Prostate biopsy  11/06  . Esophagogastroduodenoscopy    . Knee surgery      left  . Ankle surgery  1987    left  . Hernia repair  2010  . Colonoscopy  2008  . I&d extremity Left 06/10/2015    Procedure: IRRIGATION AND DEBRIDEMENT EXTREMITY LEFT HAND EXPLORATION, nerve repair;  Surgeon: Charlotte Crumb, MD;  Location: [facility];  Service: Orthopedics;  Laterality: Left;   Family History  Problem Relation Age of Onset  . Diabetes Father   . Cancer Sister   . Diabetes Brother   . Heart failure Mother    Social History  Substance Use Topics  . Smoking status: Former Smoker -- 2.50 packs/day for 3 years  . Smokeless tobacco: Never Used  . Alcohol Use: No     Comment: former drinker 20 years ago    Review of Systems ROS: Statement: All systems negative except as marked or noted in the HPI; Constitutional: Negative for fever and chills.  ; ; Eyes: Negative for eye pain, redness and discharge. ; ; ENMT: Negative for ear pain, hoarseness, nasal congestion, sinus pressure and sore throat. ; ; Cardiovascular: Negative for chest pain, palpitations, diaphoresis, dyspnea and peripheral edema. ; ; Respiratory: Negative for cough, wheezing and stridor. ; ; Gastrointestinal: Negative for nausea, vomiting, diarrhea, abdominal pain, blood in stool, hematemesis, jaundice and rectal bleeding. . ; ; Genitourinary: Negative for dysuria, flank pain and hematuria. ; ; Musculoskeletal: Negative for back pain and neck pain. Negative for swelling and trauma.; ; Skin: Negative for pruritus, rash, abrasions, blisters, bruising and skin lesion.; ; Neuro: +left facial droop. Negative for headache, lightheadedness and neck stiffness. Negative for weakness, altered level of consciousness , altered mental status, extremity weakness, paresthesias, involuntary movement, seizure and syncope.      Allergies  Tylenol  Home Medications   Prior to Admission medications   Medication Sig Start Date End Date Taking? Authorizing Provider  aspirin 325 MG tablet Take 925 mg by mouth at bedtime.   Yes Historical Provider, MD  bicalutamide (CASODEX) 50 MG tablet Take 1 tablet (50 mg total) by mouth daily. 06/11/15  Yes Patrici Ranks, MD  hydrochlorothiazide (HYDRODIURIL) 25 MG tablet Take 25 mg by mouth daily.   Yes Percell Miller  Luan Pulling, MD  HYDROcodone-acetaminophen (NORCO/VICODIN) 5-325 MG per tablet Take 1 tablet by mouth every 4 (four) hours as needed for moderate pain.   Yes Historical Provider, MD  Leuprolide Acetate ([name] IJ) Inject as directed every 3 (three) months.   Yes Historical Provider, MD  divalproex (DEPAKOTE) 250 MG DR tablet 2 tabs my mouth AM and 3 tabs PM Patient not taking: Reported on 09/11/2015 05/26/15   Evalee Jefferson, PA-C  levETIRAcetam (KEPPRA) 500 MG tablet Take 1 tablet (500 mg total) by mouth 2 (two) times daily. Patient not taking: Reported on  09/11/2015 05/26/15   Evalee Jefferson, PA-C  oxyCODONE-acetaminophen (ROXICET) 5-325 MG per tablet Take 1 tablet by mouth every 4 (four) hours as needed for severe pain. Patient not taking: Reported on 10/30/2015 06/16/15   Chipper Herb, MD  propranolol (INDERAL) 20 MG tablet Take 1 tablet (20 mg total) by mouth 3 (three) times daily. Patient not taking: Reported on 09/11/2015 05/26/15   Evalee Jefferson, PA-C   BP 131/87 mmHg  Pulse 73  Temp(Src) 99.1 F (37.3 C) (Oral)  Resp 18  Ht 5\' 7"  (1.702 m)  Wt 220 lb (99.791 kg)  BMI 34.45 kg/m2  SpO2 97% Physical Exam  1625: Physical examination:  Nursing notes reviewed; Vital signs and O2 SAT reviewed;  Constitutional: Well developed, Well nourished, Well hydrated, In no acute distress; Head:  Normocephalic, atraumatic; Eyes: EOMI, PERRL, No scleral icterus; ENMT: Mouth and pharynx normal, Mucous membranes moist; Neck: Supple, Full range of motion, No lymphadenopathy; Cardiovascular: Regular rate and rhythm, No gallop; Respiratory: Breath sounds clear & equal bilaterally, No wheezes.  Speaking full sentences with ease, Normal respiratory effort/excursion; Chest: Nontender, Movement normal; Abdomen: Soft, Nontender, Nondistended, Normal bowel sounds; Genitourinary: No CVA tenderness; Extremities: Pulses normal, No tenderness, No edema, No calf edema or asymmetry.; Neuro: AA&Ox3, Major CN grossly intact. Speech clear.  +left lower facial droop.  No nystagmus. Grips equal. Strength 5/5 equal bilat UE's and LE's.  DTR 2/4 equal bilat UE's and LE's.  No gross sensory deficits.  Normal cerebellar testing bilat UE's (finger-nose) and LE's (heel-shin)..; Skin: Color normal, Warm, Dry.   ED Course  Procedures (including critical care time) Labs Review   Imaging Review  I have personally reviewed and evaluated these images and lab results as part of my medical decision-making.   EKG Interpretation   Date/Time:  Friday October 30 2015 15:55:59 EST Ventricular  Rate:  79 PR Interval:  155 QRS Duration: 102 QT Interval:  430 QTC Calculation: 493 R Axis:   34 Text Interpretation:  Sinus rhythm Borderline prolonged QT interval When  compared with ECG of 04/02/2014 No significant change was found Confirmed  by [facility]  MD, Nunzio Cory [phone]) on 10/30/2015 4:50:15 PM      MDM  MDM Reviewed: previous chart, nursing note and vitals Reviewed previous: labs and ECG Interpretation: labs, ECG and MRI      Results for orders placed or performed during the hospital encounter of 10/30/15  Ethanol  Result Value Ref Range   Alcohol, Ethyl (B) <5 <5 mg/dL  Protime-INR  Result Value Ref Range   Prothrombin Time 13.5 11.6 - 15.2 seconds   INR 1.01 0.00 - 1.49  APTT  Result Value Ref Range   aPTT 26 24 - 37 seconds  CBC  Result Value Ref Range   WBC 8.7 4.0 - 10.5 K/uL   RBC 4.05 (L) 4.22 - 5.81 MIL/uL   Hemoglobin 13.0 13.0 - 17.0 g/dL  HCT 36.8 (L) 39.0 - 52.0 %   MCV 90.9 78.0 - 100.0 fL   MCH 32.1 26.0 - 34.0 pg   MCHC 35.3 30.0 - 36.0 g/dL   RDW 12.7 11.5 - 15.5 %   Platelets 352 150 - 400 K/uL  Differential  Result Value Ref Range   Neutrophils Relative % 59 %   Neutro Abs 5.1 1.7 - 7.7 K/uL   Lymphocytes Relative 31 %   Lymphs Abs 2.7 0.7 - 4.0 K/uL   Monocytes Relative 5 %   Monocytes Absolute 0.5 0.1 - 1.0 K/uL   Eosinophils Relative 4 %   Eosinophils Absolute 0.4 0.0 - 0.7 K/uL   Basophils Relative 1 %   Basophils Absolute 0.0 0.0 - 0.1 K/uL  Comprehensive metabolic panel  Result Value Ref Range   Sodium 141 135 - 145 mmol/L   Potassium 3.3 (L) 3.5 - 5.1 mmol/L   Chloride 105 101 - 111 mmol/L   CO2 26 22 - 32 mmol/L   Glucose, Bld 165 (H) 65 - 99 mg/dL   BUN 27 (H) 6 - 20 mg/dL   Creatinine, Ser 1.05 0.61 - 1.24 mg/dL   Calcium 9.7 8.9 - 10.3 mg/dL   Total Protein 7.8 6.5 - 8.1 g/dL   Albumin 4.5 3.5 - 5.0 g/dL   AST 30 15 - 41 U/L   ALT 23 17 - 63 U/L   Alkaline Phosphatase 55 38 - 126 U/L   Total Bilirubin 0.5 0.3  - 1.2 mg/dL   GFR calc non Af Amer >60 >60 mL/min   GFR calc Af Amer >60 >60 mL/min   Anion gap 10 5 - 15  CBG monitoring, ED  Result Value Ref Range   Glucose-Capillary 197 (H) 65 - 99 mg/dL   Comment 1 Notify RN    Comment 2 Document in Chart   I-Stat Chem 8, ED  (not at [facility])  Result Value Ref Range   Sodium 143 135 - 145 mmol/L   Potassium 3.0 (L) 3.5 - 5.1 mmol/L   Chloride 102 101 - 111 mmol/L   BUN 24 (H) 6 - 20 mg/dL   Creatinine, Ser 1.10 0.61 - 1.24 mg/dL   Glucose, Bld 127 (H) 65 - 99 mg/dL   Calcium, Ion 1.19 1.13 - 1.30 mmol/L   TCO2 25 0 - 100 mmol/L   Hemoglobin 12.9 (L) 13.0 - 17.0 g/dL   HCT 38.0 (L) 39.0 - 52.0 %   Mr Brain Wo Contrast (neuro Protocol) 10/30/2015  CLINICAL DATA:  Left facial droop.  History prostate cancer EXAM: MRI HEAD WITHOUT CONTRAST TECHNIQUE: Multiplanar, multiecho pulse sequences of the brain and surrounding structures were obtained without intravenous contrast. COMPARISON:  CT head 03/12/2014 FINDINGS: Negative for acute infarct. No significant chronic ischemic change. Ventricle size normal.  Cerebral volume normal. Negative for intracranial hemorrhage.  Negative for mass or edema. Mastoid sinus is clear bilaterally. Pituitary normal in size. Very mild mucosal edema in the paranasal sinuses without air-fluid level. Normal orbit. Image quality degraded by motion IMPRESSION: No acute abnormality. No infarct or mass or cause of facial droop identified. Electronically Signed   By: Franchot Gallo M.D.   On: 10/30/2015 17:50    1815:  Potassium repleted PO. MRI without acute process. Tx Bells' palsy symptomatically at this time. Dx and testing d/w pt and family.  Questions answered.  Verb understanding, agreeable to d/c home with outpt f/u.   Francine Graven, DO 11/02/15 1606

## 2015-10-30 NOTE — ED Notes (Signed)
Patient with c/o left facial droop, unable to close left eye since yesterday. Reports drooling when trying to sip water at home. Patient is alert/oriented x 4. Grips equal bilaterally, legs strong, no pronator drift. Patient speech slurred. H/o HTN, Prostate CA.

## 2015-10-30 NOTE — Discharge Instructions (Signed)
°Emergency Department Resource Guide °1) Find a Doctor and Pay Out of Pocket °Although you won't have to find out who is covered by your insurance plan, it is a good idea to ask around and get recommendations. You will then need to call the office and see if the doctor you have chosen will accept you as a new patient and what types of options they offer for patients who are self-pay. Some doctors offer discounts or will set up payment plans for their patients who do not have insurance, but you will need to ask so you aren't surprised when you get to your appointment. ° °2) Contact Your Local Health Department °Not all health departments have doctors that can see patients for sick visits, but many do, so it is worth a call to see if yours does. If you don't know where your local health department is, you can check in your phone book. The CDC also has a tool to help you locate your state's health department, and many state websites also have listings of all of their local health departments. ° °3) Find a Walk-in Clinic °If your illness is not likely to be very severe or complicated, you may want to try a walk in clinic. These are popping up all over the country in pharmacies, drugstores, and shopping centers. They're usually staffed by nurse practitioners or physician assistants that have been trained to treat common illnesses and complaints. They're usually fairly quick and inexpensive. However, if you have serious medical issues or chronic medical problems, these are probably not your best option. ° °No Primary Care Doctor: °- Call Health Connect at  832-8000 - they can help you locate a primary care doctor that  accepts your insurance, provides certain services, etc. °- Physician Referral Service- 1-800-533-3463 ° °Chronic Pain Problems: °Organization         Address  Phone   Notes  °[facility]  (336) [phone] Patients need to be referred by their primary care doctor.  ° °Medication  Assistance: °Organization         Address  Phone   Notes  °[place] [facility] [address] °[place], [place] [address] (336) 641-8030 --Must be a resident of [place] °-- Must have NO insurance coverage whatsoever (no Medicaid/ Medicare, etc.) °-- The pt. MUST have a primary care doctor that directs their care regularly and follows them in the community °  °[facility]  (866) 331-1348   °[employer]  (888) 892-1162   ° °Agencies that provide inexpensive medical care: °Organization         Address  Phone   Notes  °[facility]  (336) 832-8035   °[facility]    (336) 832-7272   °[facility] [address] °[place], [place] [address] (336) 832-4777   °[facility] [address], °[place] (336) 271-4999   °[facility]    (336) 373-0678   °[facility]    (336) 272-1050   °[facility] ° [address], [place] Phone:  (336) 832-4444, Fax:  (336) 832-4440 Hours of Operation:  9 am - 6 pm, M-F.  Also accepts Medicaid/Medicare and self-pay.  °[facility] ° [address], [place] Phone: (336) [phone], Fax: (336) [phone]. Hours of Operation:  8:30 am - 5:30 pm, M-F.  Also accepts Medicaid and self-pay.  °[facility] [place] [address], [place] Phone: (336) 878-6027   °[facility] [address], [place], [place] (336)723-1848, Ext. 123 Mondays & Thursdays: 7-9 AM.  First 15 patients are seen on a first come, first serve basis. °  ° °Medicaid-accepting [place] Providers: ° °Organization         Address  Phone   Notes  °[facility] [address], [place] (336) 641-2100 Also accepts self-pay patients.  °[facility] [address], [place] ° (336) 856-9996   °[facility] [address], [place]  (336) 288-8857   °[facility] [address] [place] [address], [place] (336) 299-7000   °Veita Bland [address], [place]  ° (336) 373-1557 Only accepts [place] Access Medicaid patients after they have their name applied to their card.  ° °Self-Pay (no insurance) in [place]: ° °Organization         Address  Phone   Notes  °Sickle Cell Patients, [facility] [address], [place] (336) 832-1970   °[facility] [address], [place] (336) 832-4400   °[facility] ° [address], [place] (336) 992-4800   °[facility]/Dr. Osei-Bonsu ° [address] [place] [address], [place] or [address], [place] (336) [phone] Phone number for both [place] and [place] locations is the same.  °[facility] [address], [place] (336) 299-0000   °[facility] [address] [place] [address], [place] or [address] (336) 852-7530 °(336) 878-2260   °[facility] [address], [place] (336) 350-1642, phone; (336) 294-5005, fax Sees patients 1st and 3rd Saturday of every month.  Must not qualify for public or private insurance (i.e. Medicaid, Medicare, [place] Health Choice, Veterans' Benefits) • Household income should be no more than 200% of the poverty level •The clinic cannot treat you if you are pregnant or think you are pregnant • Sexually transmitted diseases are not treated at the clinic.  ° ° °Dental Care: °Organization         Address  Phone  Notes  °[place] [facility] [address],  (336) 641-6152 Accepts children up to age 21 who are enrolled in Medicaid or [place] Health Choice; pregnant women with a Medicaid card; and children who have applied for Medicaid or [place] Health Choice, but were declined, whose parents can pay a reduced fee at time of service.  °[place]  [facility] [place]  [address], [place] (336) 641-7733 Accepts children up to age 21 who are enrolled in Medicaid or [place] Health Choice; pregnant women with a Medicaid card; and children who have applied for Medicaid or [place] Health Choice, but were declined, whose parents can pay a reduced fee at time of service.  °[facility] ° [address],  (336) 641-4533 Patients are seen by appointment only. Walk-ins are not accepted. [facility] will see patients 18 years of age and older. °Monday - Tuesday (8am-5pm) °Most Wednesdays (8:30-5pm) °$30 per visit, cash only  °[facility] ° [address], [place] (336) 641-4533 Patients are seen by appointment only. Walk-ins are not accepted. [facility] will see patients 18 years of age and older. °One   Wednesday Evening (Monthly: Volunteer Based).  $30 per visit, cash only  °[facility]  (919) [phone] for adults; Children under age 4, call Graduate Pediatric Dentistry at ([phone]) [phone]. Children aged 4-14, please call ([phone]) [phone] to request a pediatric application. ° Dental services are provided in all areas of dental care including fillings, crowns and bridges, complete and partial dentures, implants, gum treatment, root canals, and extractions. Preventive care is also provided. Treatment is provided to both adults and children. °Patients are selected via a lottery and there is often a waiting list. °  °[facility] [address], °[place] ° (336) 763-8833 www.drcivils.com °  °[facility] [address], [place], [place] (336)723-1848, Ext. 123 Second and Fourth Thursday of each month, opens at 6:30 AM; Clinic ends at 9 AM.  Patients are seen on a first-come first-served basis, and a limited number are seen during each clinic.  ° °[facility] ° [address], [place], [place] (336) [phone]    Eligibility Requirements °You must have lived in [place], [place], or [place] counties for at least the last three months. °  You cannot be eligible for state or federal sponsored healthcare insurance, including Veterans Administration, Medicaid, or Medicare. °  You generally cannot be eligible for healthcare insurance through your employer.  °  How to apply: °Eligibility screenings are held every Tuesday and Wednesday afternoon from 1:00 pm until 4:00 pm. You do not need an appointment for the interview!  °[facility] [address], [place], [place] 336-631-2330   °[facility]  336-342-8273   °[place] [facility]  336-703-3100   °[facility]  336-570-6415   ° °Behavioral Health Resources in the Community: °Intensive Outpatient Programs °Organization         Address  Phone  Notes  °[facility] [address], [place], [place] 336-878-6098   °[facility] [address], [place], [place] 336-832-9800   °[facility] [address], [place], [place] ° 336-882-2125   °[facility] [address],  °[place], [place] 1-800-853-5163 or 336-641-4981   °Substance Abuse Resources °Organization         Address  Phone  Notes  °[facility]  336-882-2125   °[facility]  336-784-9470   °[facility]  336-285-9073   °[facility]  336-845-3988   °Residential & Outpatient Substance Abuse Program  1-800-659-3381   °Psychological Services °Organization         Address  Phone  Notes  ° Health  336- 832-9600   °[facility]  336- 378-7881   °[facility] [address], [place] 1-800-853-5163 or 336-641-4981   ° °Mobile Crisis Teams °Organization         Address  Phone  Notes  °[facility], Mobile Crisis Care Unit  1-877-626-1772   °Assertive °[facility] ° [address]  [place], [place] 336-834-9664   °Sharon DeEsch [address] °[place] 336-554-5454   ° °Self-Help/Support Groups °Organization         Address  Phone             Notes  °[facility]  - variety of support groups  336- 373-1402 Call for more information  °Narcotics Anonymous (NA), [facility] [address], °[place]  2 meetings at this location  ° °  Residential Treatment Programs Organization         Address  Phone  Notes  [facility] [address]    [place]  1-(925)692-2352   [facility]  [address] [place] [id number], [place], [place]   [place], [place] (504) [phone] Admissions: 8am-3pm M-F  [facility] [place] [address]    [place], [place] [id number]   [facility] [address] [place], [place], [place]   [facility] [address]  [place], [place]   [facility] - Intensive Outpatient [address], [place], [place]   [facility] [address]  [place], [place] 1-401-618-7665 or 5197299564   [facility] [address], [place], [place] Accepts Medicaid  [facility] [address],  [place] 1-316-151-4493 Substance Abuse/Addiction Treatment   [facility] Organization         Address  Phone  Notes  [facility]  657-366-2675   Domenic Schwab, PhD [address] [place], [place]   239-823-1360 or (907)201-8036   [facility] [place], [place] 918-857-4684   [facility] [address] [place], [place] ([phone]) [phone] Insurance/Medicaid/sponsorship through [facility] [address]                                    [place], [place] [phone])[phone] [place] [address], [place] [phone]    Dr. Adele Schilder  [phone]   [facility] Dept. 1) [address], [place] 2) [place] 3)  [address], [place] [phone]  [phone]   [place] [phone] or [phone] (After Hours)      Take the prescriptions as directed.  Call your regular medical doctor Monday to schedule a follow up appointment within the next 4 days.  Return to the Emergency Department immediately sooner if worsening.

## 2015-10-30 NOTE — ED Notes (Signed)
MD at bedside. 

## 2015-11-11 ENCOUNTER — Encounter: Payer: Self-pay | Admitting: *Deleted

## 2015-11-11 ENCOUNTER — Encounter: Payer: Self-pay | Admitting: Family Medicine

## 2015-11-11 ENCOUNTER — Ambulatory Visit (INDEPENDENT_AMBULATORY_CARE_PROVIDER_SITE_OTHER): Payer: Medicaid Other | Admitting: Family Medicine

## 2015-11-11 VITALS — BP 126/81 | HR 79 | Temp 97.8°F | Ht 67.0 in | Wt 216.6 lb

## 2015-11-11 DIAGNOSIS — R7309 Other abnormal glucose: Secondary | ICD-10-CM

## 2015-11-11 DIAGNOSIS — C61 Malignant neoplasm of prostate: Secondary | ICD-10-CM | POA: Diagnosis not present

## 2015-11-11 DIAGNOSIS — M5441 Lumbago with sciatica, right side: Secondary | ICD-10-CM | POA: Diagnosis not present

## 2015-11-11 DIAGNOSIS — Z13220 Encounter for screening for lipoid disorders: Secondary | ICD-10-CM

## 2015-11-11 DIAGNOSIS — G510 Bell's palsy: Secondary | ICD-10-CM | POA: Diagnosis not present

## 2015-11-11 DIAGNOSIS — R739 Hyperglycemia, unspecified: Secondary | ICD-10-CM

## 2015-11-11 DIAGNOSIS — G8929 Other chronic pain: Secondary | ICD-10-CM | POA: Diagnosis not present

## 2015-11-11 LAB — POCT GLYCOSYLATED HEMOGLOBIN (HGB A1C): Hemoglobin A1C: 6.1

## 2015-11-11 MED ORDER — HYDROCODONE-ACETAMINOPHEN 5-325 MG PO TABS: 1.0000 | ORAL_TABLET | Freq: Four times a day (QID) | ORAL | Status: DC | PRN

## 2015-11-11 NOTE — Progress Notes (Signed)
BP 126/81 mmHg  Pulse 79  Temp(Src) 97.8 F (36.6 C) (Oral)  Ht _0  (1.702 m)  Wt 216 lb 9.6 oz (98.249 kg)  BMI 33.92 kg/m2   Subjective:    [name], male    DOB: [birth year], 64 y.o.   MRN: [id number]  HPI: Walter Boone is a 64 y.o. male presenting on 11/11/2015 for ER followup Bell's Palsy and Establish Care   HPI Low back pain with right-sided sciatica Patient comes in today as a new patient. Patient has had low back pain with right-sided sharp shooting pain that goes down the back of his thigh and also numbness that goes down the back and the lateral side of his thigh. He has had this since an accident as workplace after which he did have a surgery and multiple epidural injections to help control. He is also been on when necessary narcotics for pain management that he uses mainly at night when he has trouble sleeping because of the pain. He does not have any pain currently today but uses them when he has issues.  Elevated blood sugar I recent hospital visit he had an elevated blood sugar and they told to follow-up with his primary care physician and confirm whether or not he had diabetes.  Left-sided facial droop/Bell's palsy His left-sided facial droop began a couple weeks ago when he was started on prednisone and Valtrex his prednisone and Valtrex are almost finished and his facial droop is much improved from what it was previously.  Prostate cancer Patient has prostate cancer and is currently being treated with both [name] and Casodex. This is currently being managed by urology.  Relevant past medical, surgical, family and social history reviewed and updated as indicated. Interim medical history since our last visit reviewed. Allergies and medications reviewed and updated.  Review of Systems  Constitutional: Negative for fever.  HENT: Negative for ear discharge and ear pain.   Eyes: Negative for discharge and visual disturbance.    Respiratory: Negative for shortness of breath and wheezing.   Cardiovascular: Negative for chest pain and leg swelling.  Gastrointestinal: Negative for abdominal pain, diarrhea and constipation.  Genitourinary: Negative for difficulty urinating.  Musculoskeletal: Positive for back pain (Not present today but has had). Negative for gait problem.  Skin: Negative for rash.  Neurological: Positive for facial asymmetry (left-sided). Negative for syncope, light-headedness and headaches.  All other systems reviewed and are negative.   Per HPI unless specifically indicated above     Medication List       This list is accurate as of: 11/11/15  3:44 PM.  Always use your most recent med list.               artificial tears Oint ophthalmic ointment  Place into the left eye every 3 (three) hours as needed for dry eyes.     bicalutamide 50 MG tablet  Commonly known as:  CASODEX  Take 1 tablet (50 mg total) by mouth daily.     hydrochlorothiazide 25 MG tablet  Commonly known as:  HYDRODIURIL  Take 25 mg by mouth daily.     HYDROcodone-acetaminophen 5-325 MG tablet  Commonly known as:  NORCO/VICODIN  Take 1 tablet by mouth every 6 (six) hours as needed for moderate pain.     leuprolide 30 MG injection  Commonly known as:  [name]  Inject 30 mg into the muscle every 4 (four) months.     [name] IJ  Inject  as directed every 3 (three) months.           Objective:    BP 126/81 mmHg  Pulse 79  Temp(Src) 97.8 F (36.6 C) (Oral)  Ht _0  (1.702 m)  Wt 216 lb 9.6 oz (98.249 kg)  BMI 33.92 kg/m2  Wt Readings from Last 3 Encounters:  11/11/15 216 lb 9.6 oz (98.249 kg)  10/30/15 220 lb (99.791 kg)  10/30/15 220 lb (99.791 kg)    Physical Exam  Constitutional: He is oriented to person, place, and time. He appears well-developed and well-nourished. No distress.  Eyes: Conjunctivae and EOM are normal. Pupils are equal, round, and reactive to light. Right eye exhibits no discharge.  No scleral icterus.  Cardiovascular: Normal rate, regular rhythm, normal heart sounds and intact distal pulses.   No murmur heard. Pulmonary/Chest: Effort normal and breath sounds normal. No respiratory distress. He has no wheezes.  Musculoskeletal: Normal range of motion. He exhibits no edema or tenderness (unable to elicit any tenderness today, also negative straight leg raise.).  Neurological: He is alert and oriented to person, place, and time. He has normal strength and normal reflexes. A cranial nerve deficit (left sided facial droop, no other cranial nerve deficits) and sensory deficit (left-sided numbness) is present. Coordination normal.  Skin: Skin is warm and dry. No rash noted. He is not diaphoretic.  Psychiatric: He has a normal mood and affect. His behavior is normal.  Vitals reviewed.   Results for orders placed or performed during the hospital encounter of 10/30/15  Ethanol  Result Value Ref Range   Alcohol, Ethyl (B) <5 <5 mg/dL  Protime-INR  Result Value Ref Range   Prothrombin Time 13.5 11.6 - 15.2 seconds   INR 1.01 0.00 - 1.49  APTT  Result Value Ref Range   aPTT 26 24 - 37 seconds  CBC  Result Value Ref Range   WBC 8.7 4.0 - 10.5 K/uL   RBC 4.05 (L) 4.22 - 5.81 MIL/uL   Hemoglobin 13.0 13.0 - 17.0 g/dL   HCT 36.8 (L) 39.0 - 52.0 %   MCV 90.9 78.0 - 100.0 fL   MCH 32.1 26.0 - 34.0 pg   MCHC 35.3 30.0 - 36.0 g/dL   RDW 12.7 11.5 - 15.5 %   Platelets 352 150 - 400 K/uL  Differential  Result Value Ref Range   Neutrophils Relative % 59 %   Neutro Abs 5.1 1.7 - 7.7 K/uL   Lymphocytes Relative 31 %   Lymphs Abs 2.7 0.7 - 4.0 K/uL   Monocytes Relative 5 %   Monocytes Absolute 0.5 0.1 - 1.0 K/uL   Eosinophils Relative 4 %   Eosinophils Absolute 0.4 0.0 - 0.7 K/uL   Basophils Relative 1 %   Basophils Absolute 0.0 0.0 - 0.1 K/uL  Comprehensive metabolic panel  Result Value Ref Range   Sodium 141 135 - 145 mmol/L   Potassium 3.3 (L) 3.5 - 5.1 mmol/L    Chloride 105 101 - 111 mmol/L   CO2 26 22 - 32 mmol/L   Glucose, Bld 165 (H) 65 - 99 mg/dL   BUN 27 (H) 6 - 20 mg/dL   Creatinine, Ser 1.05 0.61 - 1.24 mg/dL   Calcium 9.7 8.9 - 10.3 mg/dL   Total Protein 7.8 6.5 - 8.1 g/dL   Albumin 4.5 3.5 - 5.0 g/dL   AST 30 15 - 41 U/L   ALT 23 17 - 63 U/L   Alkaline Phosphatase 55 38 -  126 U/L   Total Bilirubin 0.5 0.3 - 1.2 mg/dL   GFR calc non Af Amer >60 >60 mL/min   GFR calc Af Amer >60 >60 mL/min   Anion gap 10 5 - 15  Urine rapid drug screen (hosp performed)not at [facility]  Result Value Ref Range   Opiates POSITIVE (A) NONE DETECTED   Cocaine NONE DETECTED NONE DETECTED   Benzodiazepines NONE DETECTED NONE DETECTED   Amphetamines NONE DETECTED NONE DETECTED   Tetrahydrocannabinol NONE DETECTED NONE DETECTED   Barbiturates NONE DETECTED NONE DETECTED  Urinalysis, Routine w reflex microscopic (not at [facility])  Result Value Ref Range   Color, Urine YELLOW YELLOW   APPearance CLEAR CLEAR   Specific Gravity, Urine >1.030 (H) 1.005 - 1.030   pH 5.0 5.0 - 8.0   Glucose, UA NEGATIVE NEGATIVE mg/dL   Hgb urine dipstick NEGATIVE NEGATIVE   Bilirubin Urine NEGATIVE NEGATIVE   Ketones, ur NEGATIVE NEGATIVE mg/dL   Protein, ur NEGATIVE NEGATIVE mg/dL   Nitrite NEGATIVE NEGATIVE   Leukocytes, UA NEGATIVE NEGATIVE  CBG monitoring, ED  Result Value Ref Range   Glucose-Capillary 197 (H) 65 - 99 mg/dL   Comment 1 Notify RN    Comment 2 Document in Chart   I-Stat Chem 8, ED  (not at [facility])  Result Value Ref Range   Sodium 143 135 - 145 mmol/L   Potassium 3.0 (L) 3.5 - 5.1 mmol/L   Chloride 102 101 - 111 mmol/L   BUN 24 (H) 6 - 20 mg/dL   Creatinine, Ser 1.10 0.61 - 1.24 mg/dL   Glucose, Bld 127 (H) 65 - 99 mg/dL   Calcium, Ion 1.19 1.13 - 1.30 mmol/L   TCO2 25 0 - 100 mmol/L   Hemoglobin 12.9 (L) 13.0 - 17.0 g/dL   HCT 38.0 (L) 39.0 - 52.0 %  I-stat troponin, ED (not at [facility])  Result Value Ref Range   Troponin i, poc 0.00 0.00 - 0.08  ng/mL   Comment 3              Assessment & Plan:   Problem List Items Addressed This Visit      Genitourinary   Prostate cancer [facility])    He is being treated by urology for his prostate cancer.      Relevant Medications   leuprolide ([name]) 30 MG injection   HYDROcodone-acetaminophen (NORCO/VICODIN) 5-325 MG tablet    Other Visit Diagnoses    Chronic right-sided low back pain with right-sided sciatica    -  Primary    Relevant Medications    HYDROcodone-acetaminophen (NORCO/VICODIN) 5-325 MG tablet    Elevated blood sugar        Had elevated blood sugar at a hospital visit and wants to do is confirmation lab to see if he has diabetes, we'll check A1c    Relevant Orders    POCT glycosylated hemoglobin (Hb A1C) (Completed)    CMP14+EGFR    Left-sided Bell's palsy        At recent ER visit was diagnosed with Bell's palsy and given steroids and Valtrex. He is almost done with both of them.    Lipid screening        Relevant Orders    Lipid panel        Follow up plan: Return in about 3 months (around 02/09/2016), or if symptoms worsen or fail to improve, for Low back pain.  Counseling provided for all of the vaccine components Orders Placed This  Encounter  Procedures  . CMP14+EGFR  . Lipid panel  . POCT glycosylated hemoglobin (Hb A1C)    Caryl Pina, MD [facility] 11/11/2015, 3:44 PM

## 2015-11-11 NOTE — Assessment & Plan Note (Signed)
He is being treated by urology for his prostate cancer.

## 2015-11-12 LAB — LIPID PANEL
CHOLESTEROL TOTAL: 240 mg/dL — AB (ref 100–199)
Chol/HDL Ratio: 4.4 ratio units (ref 0.0–5.0)
HDL: 54 mg/dL (ref >39)
LDL CALC: 112 mg/dL — AB (ref 0–99)
Triglycerides: 369 mg/dL — ABNORMAL HIGH (ref 0–149)
VLDL CHOLESTEROL CAL: 74 mg/dL — AB (ref 5–40)

## 2015-11-12 LAB — CMP14+EGFR
ALBUMIN: 4.3 g/dL (ref 3.6–4.8)
ALK PHOS: 64 IU/L (ref 39–117)
ALT: 20 IU/L (ref 0–44)
AST: 14 IU/L (ref 0–40)
Albumin/Globulin Ratio: 1.5 (ref 1.1–2.5)
BUN / CREAT RATIO: 25 — AB (ref 10–22)
BUN: 23 mg/dL (ref 8–27)
CO2: 26 mmol/L (ref 18–29)
CREATININE: 0.91 mg/dL (ref 0.76–1.27)
Calcium: 9.8 mg/dL (ref 8.6–10.2)
Chloride: 96 mmol/L — ABNORMAL LOW (ref 97–106)
GFR calc non Af Amer: 90 mL/min/{1.73_m2} (ref >59)
GFR, EST AFRICAN AMERICAN: 104 mL/min/{1.73_m2} (ref >59)
GLOBULIN, TOTAL: 2.8 g/dL (ref 1.5–4.5)
GLUCOSE: 151 mg/dL — AB (ref 65–99)
Potassium: 4 mmol/L (ref 3.5–5.2)
SODIUM: 140 mmol/L (ref 136–144)
TOTAL PROTEIN: 7.1 g/dL (ref 6.0–8.5)

## 2015-11-13 ENCOUNTER — Encounter (INDEPENDENT_AMBULATORY_CARE_PROVIDER_SITE_OTHER): Payer: Self-pay | Admitting: *Deleted

## 2015-11-13 MED ORDER — CHOLINE FENOFIBRATE 45 MG PO CPDR: 45.0000 mg | DELAYED_RELEASE_CAPSULE | Freq: Every day | ORAL | Status: DC

## 2015-11-13 NOTE — Addendum Note (Signed)
Addended by: Thana Ates on: 11/13/2015 08:50 AM   Modules accepted: Orders

## 2015-11-16 ENCOUNTER — Telehealth: Payer: Self-pay | Admitting: Family Medicine

## 2015-11-16 NOTE — Telephone Encounter (Signed)
Stp and he states he needs prior auth for fenofibrate. Advised our prior auth department will handle that as soon as they receive the fax from the pharmacy.

## 2015-11-17 ENCOUNTER — Telehealth: Payer: Self-pay

## 2015-11-17 NOTE — Telephone Encounter (Signed)
Medicaid non preferred Fenofibric Acid   Preferred are : gemfibrozal tablet, Tricor tablet and Trilipix capsule

## 2015-11-18 MED ORDER — FENOFIBRATE 48 MG PO TABS: 48.0000 mg | ORAL_TABLET | Freq: Every day | ORAL | Status: DC

## 2015-11-18 NOTE — Addendum Note (Signed)
Addended by: Thana Ates on: 11/18/2015 08:20 AM   Modules accepted: Orders

## 2015-11-18 NOTE — Telephone Encounter (Signed)
Send TriCor 48, give 6 months worth

## 2015-11-18 NOTE — Telephone Encounter (Signed)
Patient aware of medication change. 

## 2015-12-09 ENCOUNTER — Encounter (HOSPITAL_COMMUNITY): Payer: Medicaid Other | Attending: Hematology & Oncology

## 2015-12-09 DIAGNOSIS — C61 Malignant neoplasm of prostate: Secondary | ICD-10-CM | POA: Diagnosis present

## 2015-12-09 LAB — CBC WITH DIFFERENTIAL/PLATELET
Basophils Absolute: 0.1 10*3/uL (ref 0.0–0.1)
Basophils Relative: 1 %
EOS ABS: 0.3 10*3/uL (ref 0.0–0.7)
EOS PCT: 4 %
HCT: 40 % (ref 39.0–52.0)
Hemoglobin: 13.6 g/dL (ref 13.0–17.0)
LYMPHS ABS: 2.6 10*3/uL (ref 0.7–4.0)
Lymphocytes Relative: 34 %
MCH: 31.6 pg (ref 26.0–34.0)
MCHC: 34 g/dL (ref 30.0–36.0)
MCV: 92.8 fL (ref 78.0–100.0)
MONO ABS: 0.4 10*3/uL (ref 0.1–1.0)
Monocytes Relative: 5 %
Neutro Abs: 4.5 10*3/uL (ref 1.7–7.7)
Neutrophils Relative %: 56 %
PLATELETS: 388 10*3/uL (ref 150–400)
RBC: 4.31 MIL/uL (ref 4.22–5.81)
RDW: 12.8 % (ref 11.5–15.5)
WBC: 7.8 10*3/uL (ref 4.0–10.5)

## 2015-12-09 LAB — COMPREHENSIVE METABOLIC PANEL
ALT: 17 U/L (ref 17–63)
ANION GAP: 8 (ref 5–15)
AST: 19 U/L (ref 15–41)
Albumin: 4.1 g/dL (ref 3.5–5.0)
Alkaline Phosphatase: 47 U/L (ref 38–126)
BUN: 30 mg/dL — ABNORMAL HIGH (ref 6–20)
CHLORIDE: 103 mmol/L (ref 101–111)
CO2: 28 mmol/L (ref 22–32)
Calcium: 9.4 mg/dL (ref 8.9–10.3)
Creatinine, Ser: 0.94 mg/dL (ref 0.61–1.24)
Glucose, Bld: 117 mg/dL — ABNORMAL HIGH (ref 65–99)
POTASSIUM: 3.3 mmol/L — AB (ref 3.5–5.1)
SODIUM: 139 mmol/L (ref 135–145)
Total Bilirubin: 0.4 mg/dL (ref 0.3–1.2)
Total Protein: 7.3 g/dL (ref 6.5–8.1)

## 2015-12-09 LAB — PSA: PSA: 0.37 ng/mL (ref 0.00–4.00)

## 2015-12-10 ENCOUNTER — Encounter (HOSPITAL_COMMUNITY): Payer: Self-pay | Admitting: Oncology

## 2015-12-10 ENCOUNTER — Encounter (HOSPITAL_COMMUNITY): Payer: Medicaid Other

## 2015-12-10 ENCOUNTER — Encounter (HOSPITAL_BASED_OUTPATIENT_CLINIC_OR_DEPARTMENT_OTHER): Payer: Medicaid Other

## 2015-12-10 ENCOUNTER — Other Ambulatory Visit (HOSPITAL_COMMUNITY): Payer: Self-pay

## 2015-12-10 ENCOUNTER — Encounter (HOSPITAL_BASED_OUTPATIENT_CLINIC_OR_DEPARTMENT_OTHER): Payer: Medicaid Other | Admitting: Oncology

## 2015-12-10 VITALS — BP 109/77 | HR 78 | Temp 98.2°F | Resp 18 | Wt 210.3 lb

## 2015-12-10 DIAGNOSIS — C61 Malignant neoplasm of prostate: Secondary | ICD-10-CM

## 2015-12-10 DIAGNOSIS — M8580 Other specified disorders of bone density and structure, unspecified site: Secondary | ICD-10-CM

## 2015-12-10 DIAGNOSIS — E291 Testicular hypofunction: Secondary | ICD-10-CM | POA: Diagnosis not present

## 2015-12-10 DIAGNOSIS — Z5111 Encounter for antineoplastic chemotherapy: Secondary | ICD-10-CM | POA: Diagnosis not present

## 2015-12-10 DIAGNOSIS — Z95828 Presence of other vascular implants and grafts: Secondary | ICD-10-CM

## 2015-12-10 MED ORDER — LEUPROLIDE ACETATE (3 MONTH) 22.5 MG IM KIT
22.5000 mg | PACK | Freq: Once | INTRAMUSCULAR | Status: AC
  Administered 2015-12-10: 22.5 mg via INTRAMUSCULAR
  Filled 2015-12-10: qty 22.5

## 2015-12-10 MED ORDER — SODIUM CHLORIDE 0.9 % IJ SOLN: 10.0000 mL | Freq: Once | INTRAMUSCULAR | Status: DC

## 2015-12-10 MED ORDER — HEPARIN SOD (PORK) LOCK FLUSH 100 UNIT/ML IV SOLN: 500.0000 [IU] | Freq: Once | INTRAVENOUS | Status: DC

## 2015-12-10 MED ORDER — HEPARIN SOD (PORK) LOCK FLUSH 100 UNIT/ML IV SOLN
INTRAVENOUS | Status: AC
  Filled 2015-12-10: qty 5

## 2015-12-10 MED ORDER — LEUPROLIDE ACETATE (6 MONTH) 45 MG IM KIT
22.5000 mg | PACK | Freq: Once | INTRAMUSCULAR | Status: DC
  Filled 2015-12-10: qty 45

## 2015-12-10 NOTE — Assessment & Plan Note (Signed)
Adenocarcinoma of the prostate, S/P definitive radiation therapy years ago, currently on [name] every 3 months and Casodex 50 mg daily.  Labs in 3 months: CBC diff, CMET, PSA  Dexa scan was previously ordered by Dr. Whitney Muse and I will make sure that this is scheduled prior to his next follow-up appointment.  In the setting of androgen deprivation, if Mr. Goudy is osteopenic/osteoporotic, he would be best served with Prolia in addition to Ca++ and Vit D.  Depo-[name] today and every 3 months.  Return in 3 months for review of bone density exam, labs, Depo-lupron injection, and follow-up.

## 2015-12-10 NOTE — Progress Notes (Signed)
[name], MD [address] [place] [address]  Prostate cancer [facility])  CURRENT THERAPY: [name] every 3 months (22.5 mg) beginning in 2012 and Casodex (50 mg daily) beginning in October 2015  INTERVAL HISTORY: Walter Boone 64 y.o. male returns for followup of adenocarcinoma of the prostate, S/P definitive radiation therapy years ago, currently on [name] every 3 months and Casodex 50 mg daily.  I personally reviewed and went over laboratory results with the patient.  The results are noted within this dictation.  Chart reviewed. ED visit noted on 10/30/2015 with facial droop.  MRI of brain was negative for any acute findings.  He was diagnosed with Bell's Palsy and treated with Valtrex and Prednisone.  He has since established his primary care with [facility].  I will send a copy of today's note to Dr. Warrick Parisian.  Today, his palsy is resolved on exam.  He is now enrolled with medicaid which has helped tremendously with his medication costs.  He notes that his Casodex is $3.    He admits to compliance with his medications.  Past Medical History  Diagnosis Date  . Prostate cancer ([place])   . Fatty liver   . Hypertension   . Hyperlipidemia   . Hemorrhoids     history  . Prostate cancer ([place]) 06/20/2011  . Prostate carcinoma, recurrent ([place]) 08/10/2012  . Gynecomastia, male 01/11/2013    Secondary to prostate ca Tx.   Marland Kitchen History of back injury 11/19/2013  . High cholesterol   . Hyperglycemia   . H/O Bell's palsy     has H N P-CERVICAL; CERVICAL SPASM; Prostate cancer ([place]); Gynecomastia, male; Chest pain; Hypertension; Hyperlipidemia; Chest pain at rest; and Osteopenia determined by x-ray on his problem list.     is allergic to tylenol.  Current Outpatient Prescriptions on File Prior to Visit  Medication Sig Dispense Refill  . bicalutamide (CASODEX) 50 MG tablet Take 1 tablet (50 mg total) by mouth daily. 30 tablet 5  . hydrochlorothiazide  (HYDRODIURIL) 25 MG tablet Take 25 mg by mouth daily.    Marland Kitchen HYDROcodone-acetaminophen (NORCO/VICODIN) 5-325 MG tablet Take 1 tablet by mouth every 6 (six) hours as needed for moderate pain. 30 tablet 0  . Leuprolide Acetate ([name] IJ) Inject as directed every 3 (three) months.    Marland Kitchen artificial tears (LACRILUBE) OINT ophthalmic ointment Place into the left eye every 3 (three) hours as needed for dry eyes. (Patient not taking: Reported on 12/10/2015) 3.5 g 0  . fenofibrate (TRICOR) 48 MG tablet Take 1 tablet (48 mg total) by mouth daily. (Patient not taking: Reported on 12/10/2015) 90 tablet 1   No current facility-administered medications on file prior to visit.    Past Surgical History  Procedure Laterality Date  . Prostate biopsy  11/06  . Esophagogastroduodenoscopy    . Knee surgery      left  . Ankle surgery  1987    left  . Hernia repair  2010  . Colonoscopy  2008  . I&d extremity Left 06/10/2015    Procedure: IRRIGATION AND DEBRIDEMENT EXTREMITY LEFT HAND EXPLORATION, nerve repair;  Surgeon: Charlotte Crumb, MD;  Location: [place];  Service: Orthopedics;  Laterality: Left;    Denies any headaches, dizziness, double vision, fevers, chills, night sweats, nausea, vomiting, diarrhea, constipation, chest pain, heart palpitations, shortness of breath, blood in stool, black tarry stool, urinary pain, urinary burning, urinary frequency, hematuria.   PHYSICAL EXAMINATION  ECOG  PERFORMANCE STATUS: 0 - Asymptomatic  Filed Vitals:   12/10/15 1308  BP: 109/77  Pulse: 78  Temp: 98.2 F (36.8 C)  Resp: 18    GENERAL:alert, no distress, well nourished, well developed, comfortable, cooperative, smiling and unaccompanied today SKIN: skin color, texture, turgor are normal, no rashes or significant lesions HEAD: Normocephalic, No masses, lesions, tenderness or abnormalities EYES: normal, EOMI, Conjunctiva are pink and non-injected EARS: External ears normal OROPHARYNX:lips, buccal mucosa,  and tongue normal and mucous membranes are moist  NECK: supple, no adenopathy, thyroid normal size, non-tender, without nodularity, trachea midline LYMPH:  no palpable lymphadenopathy BREAST:gynecomastia bilaterally note. LUNGS: clear to auscultation and percussion HEART: regular rate & rhythm, no murmurs, no gallops, S1 normal and S2 normal ABDOMEN:abdomen soft, non-tender and normal bowel sounds BACK: Back symmetric, no curvature., No CVA tenderness EXTREMITIES:less then 2 second capillary refill, no joint deformities, effusion, or inflammation, no skin discoloration, no clubbing, no cyanosis  NEURO: alert & oriented x 3 with fluent speech, no focal motor/sensory deficits, gait normal   LABORATORY DATA: CBC    Component Value Date/Time   WBC 7.8 12/09/2015 1032   RBC 4.31 12/09/2015 1032   HGB 13.6 12/09/2015 1032   HCT 40.0 12/09/2015 1032   PLT 388 12/09/2015 1032   MCV 92.8 12/09/2015 1032   MCH 31.6 12/09/2015 1032   MCHC 34.0 12/09/2015 1032   RDW 12.8 12/09/2015 1032   LYMPHSABS 2.6 12/09/2015 1032   MONOABS 0.4 12/09/2015 1032   EOSABS 0.3 12/09/2015 1032   BASOSABS 0.1 12/09/2015 1032      Chemistry      Component Value Date/Time   NA 139 12/09/2015 1032   NA 140 11/11/2015 1547   K 3.3* 12/09/2015 1032   CL 103 12/09/2015 1032   CO2 28 12/09/2015 1032   BUN 30* 12/09/2015 1032   BUN 23 11/11/2015 1547   CREATININE 0.94 12/09/2015 1032      Component Value Date/Time   CALCIUM 9.4 12/09/2015 1032   ALKPHOS 47 12/09/2015 1032   AST 19 12/09/2015 1032   ALT 17 12/09/2015 1032   BILITOT 0.4 12/09/2015 1032   BILITOT <0.2 11/11/2015 1547     Lab Results  Component Value Date   PSA 0.37 12/09/2015   PSA 0.40 09/09/2015   PSA 0.50 06/10/2015      PENDING LABS:   RADIOGRAPHIC STUDIES:  No results found.   PATHOLOGY:    ASSESSMENT AND PLAN:  Prostate cancer ([place]) Adenocarcinoma of the prostate, S/P definitive radiation therapy years ago,  currently on [name] every 3 months and Casodex 50 mg daily.  Labs in 3 months: CBC diff, CMET, PSA  Dexa scan was previously ordered by Dr. Whitney Muse and I will make sure that this is scheduled prior to his next follow-up appointment.  In the setting of androgen deprivation, if Mr. Goulder is osteopenic/osteoporotic, he would be best served with Prolia in addition to Ca++ and Vit D.  Depo-[name] today and every 3 months.  Return in 3 months for review of bone density exam, labs, Depo-lupron injection, and follow-up.   THERAPY PLAN:  Continue with Depo-[name] every 3 months and Casodex 50 mg daily.  All questions were answered. The patient knows to call the clinic with any problems, questions or concerns. We can certainly see the patient much sooner if necessary.  Patient and plan discussed with Dr. Ancil Linsey and she is in agreement with the aforementioned.   This note is electronically signed by: Robynn Pane,  PA-C 12/10/2015 1:45 PM

## 2015-12-10 NOTE — Progress Notes (Signed)
Walter Boone presents today for injection per MD orders. LupronDepot 22.5mg  administered IM in left upper outer Gluteal. Administration without incident. Patient tolerated well.

## 2015-12-10 NOTE — Patient Instructions (Signed)
[place] at [facility] Discharge Instructions  RECOMMENDATIONS MADE BY THE CONSULTANT AND ANY TEST RESULTS WILL BE SENT TO YOUR REFERRING PHYSICIAN.  Exam and discussion by Robynn Pane, PA-C Will get Bone Density within the next 3 months. Will check labs every 3 months Depo [name] injections every 3 months. Call with concerns or issues.  Follow-up in 3 months with Dr. Whitney Muse.  Thank you for choosing [place] [facility] to provide your oncology and hematology care.  To afford each patient quality time with our provider, please arrive at least 15 minutes before your scheduled appointment time.    You need to re-schedule your appointment should you arrive 10 or more minutes late.  We strive to give you quality time with our providers, and arriving late affects you and other patients whose appointments are after yours.  Also, if you no show three or more times for appointments you may be dismissed from the clinic at the providers discretion.     Again, thank you for choosing [facility].  Our hope is that these requests will decrease the amount of time that you wait before being seen by our physicians.       _____________________________________________________________  Should you have questions after your visit to [facility], please contact our office at [phone] between the hours of 8:30 a.m. and 4:30 p.m.  Voicemails left after 4:30 p.m. will not be returned until the following business day.  For prescription refill requests, have your pharmacy contact our office.

## 2016-01-06 ENCOUNTER — Other Ambulatory Visit (HOSPITAL_COMMUNITY): Payer: Self-pay

## 2016-01-12 ENCOUNTER — Ambulatory Visit (HOSPITAL_COMMUNITY)
Admission: RE | Admit: 2016-01-12 | Discharge: 2016-01-12 | Disposition: A | Discharge status: Home / Self Care | Payer: Medicaid Other | Source: Ambulatory Visit | Attending: Hematology & Oncology | Admitting: Hematology & Oncology

## 2016-01-12 DIAGNOSIS — M8580 Other specified disorders of bone density and structure, unspecified site: Secondary | ICD-10-CM | POA: Insufficient documentation

## 2016-01-12 DIAGNOSIS — C61 Malignant neoplasm of prostate: Secondary | ICD-10-CM | POA: Insufficient documentation

## 2016-01-27 ENCOUNTER — Encounter: Payer: Self-pay | Admitting: Family Medicine

## 2016-01-27 ENCOUNTER — Ambulatory Visit (INDEPENDENT_AMBULATORY_CARE_PROVIDER_SITE_OTHER): Payer: Medicaid Other | Admitting: Family Medicine

## 2016-01-27 VITALS — BP 136/83 | HR 81 | Temp 97.2°F | Ht 67.0 in | Wt 207.6 lb

## 2016-01-27 DIAGNOSIS — C61 Malignant neoplasm of prostate: Secondary | ICD-10-CM

## 2016-01-27 DIAGNOSIS — M25562 Pain in left knee: Secondary | ICD-10-CM

## 2016-01-27 DIAGNOSIS — I10 Essential (primary) hypertension: Secondary | ICD-10-CM

## 2016-01-27 DIAGNOSIS — E785 Hyperlipidemia, unspecified: Secondary | ICD-10-CM

## 2016-01-27 DIAGNOSIS — R7303 Prediabetes: Secondary | ICD-10-CM | POA: Diagnosis not present

## 2016-01-27 LAB — GLUCOSE, POCT (MANUAL RESULT ENTRY): POC GLUCOSE: 106 mg/dL — AB (ref 70–99)

## 2016-01-27 MED ORDER — HYDROCODONE-ACETAMINOPHEN 5-325 MG PO TABS: 1.0000 | ORAL_TABLET | Freq: Four times a day (QID) | ORAL | Status: DC | PRN

## 2016-01-27 MED ORDER — MELOXICAM 7.5 MG PO TABS: 7.5000 mg | ORAL_TABLET | Freq: Every day | ORAL | Status: DC

## 2016-01-27 NOTE — Assessment & Plan Note (Signed)
Check blood sugar, recheck A1c in 2 months patient is doing diet and exercise. He has lost 10 pounds

## 2016-01-27 NOTE — Progress Notes (Signed)
BP 136/83 mmHg  Pulse 81  Temp(Src) 97.2 F (36.2 C) (Oral)  Ht 5\' 7"  (1.702 m)  Wt 207 lb 9.6 oz (94.167 kg)  BMI 32.51 kg/m2   Subjective:    [name], male    DOB: Oct 03, 1951, 65 y.o.   MRN: [id number]  HPI: Walter Boone is a 65 y.o. male presenting on 01/27/2016 for Diabetes; Hyperlipidemia; Knee Pain; and Medication Refill   HPI Hypertension recheck Patient comes in today for hypertension recheck. He is currently on hydrochlorothiazide. His blood pressure today is 136/83 which is mostly controlled.Patient denies headaches, blurred vision, chest pains, shortness of breath, or weakness. Denies any side effects from medication and is content with current medication.   Prostate cancer  Patient comes in today with a history of prostate cancer being treated with [name]. This is being managed by his urologist. he does not have any symptoms from this currently.  Hyperlipidemia Patient is on TriCor for his cholesterol currently. Patient is due for cholesterol recheck. He denies any issues with chest pain or visual disturbances or any other effects from the medication.  Left knee pain patient has been having increased left knee pain. He has had imaging before this elderly has arthritic arthritis of the knee. He is currently been taking Norco when necessary for this but is not on any kind of anti-inflammatory. He says sometimes it does feel like it locks and catches. He denies any weakness or loss of range of motion.  Prediabetes Patient was diagnosed with prediabetes. He has been attempting diet and exercise. His last A1c was 1 month ago and is not due for one again. His blood sugar today fasting was 106.  Relevant past medical, surgical, family and social history reviewed and updated as indicated. Interim medical history since our last visit reviewed. Allergies and medications reviewed and updated.  Review of Systems  Constitutional: Negative for fever.    HENT: Negative for ear discharge and ear pain.   Eyes: Negative for discharge and visual disturbance.  Respiratory: Negative for shortness of breath and wheezing.   Cardiovascular: Negative for chest pain and leg swelling.  Gastrointestinal: Negative for abdominal pain, diarrhea and constipation.  Genitourinary: Negative for dysuria, urgency, decreased urine volume and difficulty urinating.  Musculoskeletal: Positive for arthralgias. Negative for back pain, joint swelling and gait problem.  Skin: Negative for rash.  Neurological: Negative for syncope, light-headedness and headaches.  All other systems reviewed and are negative.   Per HPI unless specifically indicated above     Medication List       This list is accurate as of: 01/27/16 10:10 AM.  Always use your most recent med list.               aspirin 325 MG tablet  Take 325 mg by mouth daily.     bicalutamide 50 MG tablet  Commonly known as:  CASODEX  Take 1 tablet (50 mg total) by mouth daily.     diphenhydrAMINE 25 mg capsule  Commonly known as:  BENADRYL  Take 25 mg by mouth at bedtime as needed.     fenofibrate 48 MG tablet  Commonly known as:  TRICOR  Take 1 tablet (48 mg total) by mouth daily.     hydrochlorothiazide 25 MG tablet  Commonly known as:  HYDRODIURIL  Take 25 mg by mouth daily.     HYDROcodone-acetaminophen 5-325 MG tablet  Commonly known as:  NORCO/VICODIN  Take 1 tablet by mouth  every 6 (six) hours as needed for moderate pain.     [name] IJ  Inject as directed every 3 (three) months.     Melatonin 5 MG Tabs  Take 5 mg by mouth.     meloxicam 7.5 MG tablet  Commonly known as:  MOBIC  Take 1 tablet (7.5 mg total) by mouth daily.     MULTIVITAMIN GUMMIES ADULT Chew  Chew by mouth daily. Takes one daily           Objective:    BP 136/83 mmHg  Pulse 81  Temp(Src) 97.2 F (36.2 C) (Oral)  Ht 5\' 7"  (1.702 m)  Wt 207 lb 9.6 oz (94.167 kg)  BMI 32.51 kg/m2  Wt Readings from  Last 3 Encounters:  01/27/16 207 lb 9.6 oz (94.167 kg)  12/10/15 210 lb 4.8 oz (95.391 kg)  11/11/15 216 lb 9.6 oz (98.249 kg)    Physical Exam  Constitutional: He is oriented to person, place, and time. He appears well-developed and well-nourished. No distress.  Eyes: Conjunctivae and EOM are normal. Pupils are equal, round, and reactive to light. Right eye exhibits no discharge. No scleral icterus.  Neck: Neck supple. No thyromegaly present.  Cardiovascular: Normal rate, regular rhythm, normal heart sounds and intact distal pulses.   No murmur heard. Pulmonary/Chest: Effort normal and breath sounds normal. No respiratory distress. He has no wheezes.  Abdominal: He exhibits no distension. There is no tenderness. There is no rebound.  Musculoskeletal: Normal range of motion. He exhibits no edema.       Left knee: He exhibits effusion (minimal). He exhibits normal range of motion, no swelling, no erythema, normal alignment, no LCL laxity, normal patellar mobility, normal meniscus and no MCL laxity. Tenderness found. Medial joint line and lateral joint line tenderness noted.  Lymphadenopathy:    He has no cervical adenopathy.  Neurological: He is alert and oriented to person, place, and time. Coordination normal.  Skin: Skin is warm and dry. No rash noted. He is not diaphoretic.  Psychiatric: He has a normal mood and affect. His behavior is normal.  Nursing note and vitals reviewed.   Results for orders placed or performed in visit on 12/09/15  CBC with Differential  Result Value Ref Range   WBC 7.8 4.0 - 10.5 K/uL   RBC 4.31 4.22 - 5.81 MIL/uL   Hemoglobin 13.6 13.0 - 17.0 g/dL   HCT 40.0 39.0 - 52.0 %   MCV 92.8 78.0 - 100.0 fL   MCH 31.6 26.0 - 34.0 pg   MCHC 34.0 30.0 - 36.0 g/dL   RDW 12.8 11.5 - 15.5 %   Platelets 388 150 - 400 K/uL   Neutrophils Relative % 56 %   Neutro Abs 4.5 1.7 - 7.7 K/uL   Lymphocytes Relative 34 %   Lymphs Abs 2.6 0.7 - 4.0 K/uL   Monocytes Relative  5 %   Monocytes Absolute 0.4 0.1 - 1.0 K/uL   Eosinophils Relative 4 %   Eosinophils Absolute 0.3 0.0 - 0.7 K/uL   Basophils Relative 1 %   Basophils Absolute 0.1 0.0 - 0.1 K/uL  Comprehensive metabolic panel  Result Value Ref Range   Sodium 139 135 - 145 mmol/L   Potassium 3.3 (L) 3.5 - 5.1 mmol/L   Chloride 103 101 - 111 mmol/L   CO2 28 22 - 32 mmol/L   Glucose, Bld 117 (H) 65 - 99 mg/dL   BUN 30 (H) 6 - 20 mg/dL   Creatinine,  Ser 0.94 0.61 - 1.24 mg/dL   Calcium 9.4 8.9 - 10.3 mg/dL   Total Protein 7.3 6.5 - 8.1 g/dL   Albumin 4.1 3.5 - 5.0 g/dL   AST 19 15 - 41 U/L   ALT 17 17 - 63 U/L   Alkaline Phosphatase 47 38 - 126 U/L   Total Bilirubin 0.4 0.3 - 1.2 mg/dL   GFR calc non Af Amer >60 >60 mL/min   GFR calc Af Amer >60 >60 mL/min   Anion gap 8 5 - 15  PSA  Result Value Ref Range   PSA 0.37 0.00 - 4.00 ng/mL      Assessment & Plan:   Problem List Items Addressed This Visit      Cardiovascular and Mediastinum   Hypertension   Relevant Medications   aspirin 325 MG tablet     Genitourinary   Prostate cancer ([facility]) - Primary   Relevant Medications   aspirin 325 MG tablet   HYDROcodone-acetaminophen (NORCO/VICODIN) 5-325 MG tablet   meloxicam (MOBIC) 7.5 MG tablet     Other   Hyperlipidemia    Did not receive TriCor yet, will start TriCor and come back in 2 months for recheck      Relevant Medications   aspirin 325 MG tablet   Prediabetes    Check blood sugar, recheck A1c in 2 months patient is doing diet and exercise. He has lost 10 pounds      Relevant Orders   POCT glucose (manual entry) (Completed)    Other Visit Diagnoses    Left knee pain        Likely osteoarthritis    Relevant Medications    meloxicam (MOBIC) 7.5 MG tablet       Follow up plan: Return in about 2 months (around 03/26/2016), or if symptoms worsen or fail to improve, for Recheck hemoglobin A1c and cholesterol.  Counseling provided for all of the vaccine components Orders  Placed This Encounter  Procedures  . POCT glucose (manual entry)    Caryl Pina, MD [facility] 01/27/2016, 10:10 AM

## 2016-01-27 NOTE — Assessment & Plan Note (Signed)
Did not receive TriCor yet, will start TriCor and come back in 2 months for recheck

## 2016-02-11 ENCOUNTER — Ambulatory Visit (INDEPENDENT_AMBULATORY_CARE_PROVIDER_SITE_OTHER): Payer: Medicaid Other | Admitting: Family Medicine

## 2016-02-11 ENCOUNTER — Encounter: Payer: Self-pay | Admitting: Family Medicine

## 2016-02-11 ENCOUNTER — Ambulatory Visit (INDEPENDENT_AMBULATORY_CARE_PROVIDER_SITE_OTHER): Payer: Medicaid Other

## 2016-02-11 VITALS — BP 125/88 | HR 101 | Temp 97.4°F | Ht 67.0 in | Wt 207.6 lb

## 2016-02-11 DIAGNOSIS — M25562 Pain in left knee: Secondary | ICD-10-CM | POA: Diagnosis not present

## 2016-02-11 MED ORDER — METHYLPREDNISOLONE ACETATE 80 MG/ML IJ SUSP
40.0000 mg | Freq: Once | INTRAMUSCULAR | Status: AC
  Administered 2016-02-11: 40 mg via INTRA_ARTICULAR

## 2016-02-11 NOTE — Progress Notes (Signed)
BP 125/88 mmHg  Pulse 101  Temp(Src) 97.4 F (36.3 C) (Oral)  Ht 5\' 7"  (1.702 m)  Wt 207 lb 9.6 oz (94.167 kg)  BMI 32.51 kg/m2   Subjective:    [name], male    DOB: 27-Jun-1951, 64 y.o.   MRN: [id number]  HPI: Walter Boone is a 65 y.o. male presenting on 02/11/2016 for Left knee pain   HPI Left knee pain Patient has been having left knee pain that has persisted and not getting any better with oral anti-inflammatories. He would like to see about getting an injection and some x-rays. He says the pain has gotten worse and he feels like it pops or gets a sharp stabbing pain sometimes when he is walking. He also feels like that gives out on him sometimes.  Relevant past medical, surgical, family and social history reviewed and updated as indicated. Interim medical history since our last visit reviewed. Allergies and medications reviewed and updated.  Review of Systems  Constitutional: Negative for fever and chills.  HENT: Negative for ear discharge and ear pain.   Eyes: Negative for discharge and visual disturbance.  Respiratory: Negative for shortness of breath and wheezing.   Cardiovascular: Negative for chest pain and leg swelling.  Gastrointestinal: Negative for abdominal pain, diarrhea and constipation.  Genitourinary: Negative for dysuria, urgency, decreased urine volume and difficulty urinating.  Musculoskeletal: Positive for arthralgias. Negative for back pain, joint swelling and gait problem.  Skin: Negative for rash.  Neurological: Negative for syncope, light-headedness and headaches.  All other systems reviewed and are negative.   Per HPI unless specifically indicated above     Medication List       This list is accurate as of: 02/11/16  3:15 PM.  Always use your most recent med list.               aspirin 325 MG tablet  Take 325 mg by mouth daily.     bicalutamide 50 MG tablet  Commonly known as:  CASODEX  Take 1 tablet (50 mg  total) by mouth daily.     hydrochlorothiazide 25 MG tablet  Commonly known as:  HYDRODIURIL  Take 25 mg by mouth daily.     HYDROcodone-acetaminophen 5-325 MG tablet  Commonly known as:  NORCO/VICODIN  Take 1 tablet by mouth every 6 (six) hours as needed for moderate pain.     [name] IJ  Inject as directed every 3 (three) months.     Melatonin 5 MG Tabs  Take 5 mg by mouth.     meloxicam 7.5 MG tablet  Commonly known as:  MOBIC  Take 1 tablet (7.5 mg total) by mouth daily.     MULTIVITAMIN GUMMIES ADULT Chew  Chew by mouth daily. Takes one daily           Objective:    BP 125/88 mmHg  Pulse 101  Temp(Src) 97.4 F (36.3 C) (Oral)  Ht 5\' 7"  (1.702 m)  Wt 207 lb 9.6 oz (94.167 kg)  BMI 32.51 kg/m2  Wt Readings from Last 3 Encounters:  02/11/16 207 lb 9.6 oz (94.167 kg)  01/27/16 207 lb 9.6 oz (94.167 kg)  12/10/15 210 lb 4.8 oz (95.391 kg)    Physical Exam  Constitutional: He is oriented to person, place, and time. He appears well-developed and well-nourished. No distress.  Eyes: Conjunctivae and EOM are normal. Pupils are equal, round, and reactive to light. Right eye exhibits no discharge. No scleral  icterus.  Neck: Neck supple. No thyromegaly present.  Cardiovascular: Normal rate, regular rhythm, normal heart sounds and intact distal pulses.   No murmur heard. Pulmonary/Chest: Effort normal and breath sounds normal. No respiratory distress. He has no wheezes.  Musculoskeletal: Normal range of motion. He exhibits no edema.       Left knee: He exhibits effusion (minimal). He exhibits normal range of motion, no swelling, no erythema, normal alignment, no LCL laxity, normal patellar mobility, normal meniscus and no MCL laxity. Tenderness found. Medial joint line and lateral joint line tenderness noted.  Lymphadenopathy:    He has no cervical adenopathy.  Neurological: He is alert and oriented to person, place, and time. Coordination normal.  Skin: Skin is warm and  dry. No rash noted. He is not diaphoretic.  Psychiatric: He has a normal mood and affect. His behavior is normal.  Nursing note and vitals reviewed.  Knee injection: Risk factors of bleeding and infection discussed with patient and patient is agreeable towards injection. Patient prepped with Betadine. Lateral approach towards injection used. Injected 80mg  of Depo-Medrol and 1 mL of 2% lidocaine. Patient tolerated procedure well and no side effects from noted. Minimal to no bleeding. Simple bandage applied after.     Assessment & Plan:   Problem List Items Addressed This Visit      Other   Knee pain, left - Primary   Relevant Medications   methylPREDNISolone acetate (DEPO-MEDROL) injection 40 mg (Start on 02/11/2016  3:30 PM)   Other Relevant Orders   DG Knee 1-2 Views Left      Await the x-ray results.  Follow up plan: Return if symptoms worsen or fail to improve.  Counseling provided for all of the vaccine components Orders Placed This Encounter  Procedures  . DG Knee 1-2 Views Left    Caryl Pina, MD [facility] 02/11/2016, 3:15 PM

## 2016-03-08 ENCOUNTER — Encounter (HOSPITAL_COMMUNITY): Payer: Medicaid Other | Attending: Hematology & Oncology

## 2016-03-08 ENCOUNTER — Other Ambulatory Visit (HOSPITAL_COMMUNITY): Payer: Self-pay | Admitting: Oncology

## 2016-03-08 DIAGNOSIS — C61 Malignant neoplasm of prostate: Secondary | ICD-10-CM | POA: Diagnosis present

## 2016-03-08 DIAGNOSIS — I10 Essential (primary) hypertension: Secondary | ICD-10-CM | POA: Insufficient documentation

## 2016-03-08 DIAGNOSIS — M8580 Other specified disorders of bone density and structure, unspecified site: Secondary | ICD-10-CM | POA: Diagnosis not present

## 2016-03-08 DIAGNOSIS — E876 Hypokalemia: Secondary | ICD-10-CM | POA: Insufficient documentation

## 2016-03-08 DIAGNOSIS — Z8546 Personal history of malignant neoplasm of prostate: Secondary | ICD-10-CM | POA: Insufficient documentation

## 2016-03-08 DIAGNOSIS — Z833 Family history of diabetes mellitus: Secondary | ICD-10-CM | POA: Diagnosis not present

## 2016-03-08 DIAGNOSIS — Z87891 Personal history of nicotine dependence: Secondary | ICD-10-CM | POA: Diagnosis not present

## 2016-03-08 DIAGNOSIS — Z809 Family history of malignant neoplasm, unspecified: Secondary | ICD-10-CM | POA: Insufficient documentation

## 2016-03-08 DIAGNOSIS — K760 Fatty (change of) liver, not elsewhere classified: Secondary | ICD-10-CM | POA: Insufficient documentation

## 2016-03-08 DIAGNOSIS — Z9889 Other specified postprocedural states: Secondary | ICD-10-CM | POA: Insufficient documentation

## 2016-03-08 LAB — COMPREHENSIVE METABOLIC PANEL
ALT: 23 U/L (ref 17–63)
AST: 28 U/L (ref 15–41)
Albumin: 4.2 g/dL (ref 3.5–5.0)
Alkaline Phosphatase: 51 U/L (ref 38–126)
Anion gap: 10 (ref 5–15)
BUN: 24 mg/dL — ABNORMAL HIGH (ref 6–20)
CHLORIDE: 104 mmol/L (ref 101–111)
CO2: 27 mmol/L (ref 22–32)
CREATININE: 0.87 mg/dL (ref 0.61–1.24)
Calcium: 9.1 mg/dL (ref 8.9–10.3)
Glucose, Bld: 122 mg/dL — ABNORMAL HIGH (ref 65–99)
POTASSIUM: 3.2 mmol/L — AB (ref 3.5–5.1)
Sodium: 141 mmol/L (ref 135–145)
Total Bilirubin: 0.6 mg/dL (ref 0.3–1.2)
Total Protein: 7.2 g/dL (ref 6.5–8.1)

## 2016-03-08 LAB — PSA: PSA: 0.55 ng/mL (ref 0.00–4.00)

## 2016-03-08 MED ORDER — POTASSIUM CHLORIDE CRYS ER 20 MEQ PO TBCR: 20.0000 meq | EXTENDED_RELEASE_TABLET | Freq: Two times a day (BID) | ORAL | Status: DC

## 2016-03-09 ENCOUNTER — Encounter (HOSPITAL_BASED_OUTPATIENT_CLINIC_OR_DEPARTMENT_OTHER): Payer: Medicaid Other | Admitting: Hematology & Oncology

## 2016-03-09 ENCOUNTER — Encounter (HOSPITAL_BASED_OUTPATIENT_CLINIC_OR_DEPARTMENT_OTHER): Payer: Medicaid Other

## 2016-03-09 ENCOUNTER — Encounter (HOSPITAL_COMMUNITY): Payer: Self-pay | Admitting: Hematology & Oncology

## 2016-03-09 VITALS — BP 152/75 | HR 68 | Temp 97.7°F | Resp 18 | Wt 200.0 lb

## 2016-03-09 DIAGNOSIS — R972 Elevated prostate specific antigen [PSA]: Secondary | ICD-10-CM

## 2016-03-09 DIAGNOSIS — M8580 Other specified disorders of bone density and structure, unspecified site: Secondary | ICD-10-CM | POA: Diagnosis not present

## 2016-03-09 DIAGNOSIS — C61 Malignant neoplasm of prostate: Secondary | ICD-10-CM

## 2016-03-09 DIAGNOSIS — Z5111 Encounter for antineoplastic chemotherapy: Secondary | ICD-10-CM | POA: Diagnosis present

## 2016-03-09 DIAGNOSIS — R634 Abnormal weight loss: Secondary | ICD-10-CM

## 2016-03-09 MED ORDER — LEUPROLIDE ACETATE (3 MONTH) 22.5 MG IM KIT
22.5000 mg | PACK | Freq: Once | INTRAMUSCULAR | Status: AC
  Administered 2016-03-09: 22.5 mg via INTRAMUSCULAR
  Filled 2016-03-09: qty 22.5

## 2016-03-09 NOTE — Patient Instructions (Addendum)
[place] at [facility] Discharge Instructions  RECOMMENDATIONS MADE BY THE CONSULTANT AND ANY TEST RESULTS WILL BE SENT TO YOUR REFERRING PHYSICIAN.   Exam and discussion by Dr Whitney Muse today lupron today lupron every 3 months Your PSA is rising a little, so we are going to re-image you.  CT scans scheduled  We need to start you on a shot called prolia that you get twice a year, this will help the thinning of your bones. You need to see a dentist prior to administration of prolia. Please call us after you see your dentist and we can get your scheduled for your prolia injection-[phone] Amy  Return to see the doctor in 3 months with lab work  Please call the clinic if you have any questions or concerns   Talking points: Prolia   These medications can cause osteonecrosis of the jaw which symptomatically can present as jaw pain/tenderness.  If you develop jaw pain/tenderness/soreness, a nurse at the [place] be contacted.  [place] may go to the dentist to have your teeth cleaned or to have a filling without notifying us.  However, if you require more dental work than this you MUST contact a nurse at the [facility] prior to making your dental appointment.  This is for your safety!  You MUST notify your dental hygienist/dentist that you are on this medication.   You will need to take calcium 1200mg  and vitamin D 1000-2000units every day.  You can take a [place] with D that is equivalent to this dose or take separate tablets to equal this dosage.         Denosumab injection What is this medicine? DENOSUMAB (den oh sue mab) slows bone breakdown. Prolia is used to treat osteoporosis in women after menopause and in men. Delton See is used to prevent bone fractures and other bone problems caused by cancer bone metastases. Delton See is also used to treat giant cell tumor of the bone. This medicine may be used for other purposes; ask your health care  provider or pharmacist if you have questions. What should I tell my health care provider before I take this medicine? They need to know if you have any of these conditions: -dental disease -eczema -infection or history of infections -kidney disease or on dialysis -low blood calcium or vitamin D -malabsorption syndrome -scheduled to have surgery or tooth extraction -taking medicine that contains denosumab -thyroid or parathyroid disease -an unusual reaction to denosumab, other medicines, foods, dyes, or preservatives -pregnant or trying to get pregnant -breast-feeding How should I use this medicine? This medicine is for injection under the skin. It is given by a health care professional in a hospital or clinic setting. If you are getting Prolia, a special MedGuide will be given to you by the pharmacist with each prescription and refill. Be sure to read this information carefully each time. For Prolia, talk to your pediatrician regarding the use of this medicine in children. Special care may be needed. For Delton See, talk to your pediatrician regarding the use of this medicine in children. While this drug may be prescribed for children as young as 13 years for selected conditions, precautions do apply. Overdosage: If you think you have taken too much of this medicine contact a poison control center or emergency room at once. NOTE: This medicine is only for you. Do not share this medicine with others. What if I miss a dose? It is important not to miss your dose.  Call your doctor or health care professional if you are unable to keep an appointment. What may interact with this medicine? Do not take this medicine with any of the following medications: -other medicines containing denosumab This medicine may also interact with the following medications: -medicines that suppress the immune system -medicines that treat cancer -steroid medicines like prednisone or cortisone This list may not describe  all possible interactions. Give your health care provider a list of all the medicines, herbs, non-prescription drugs, or dietary supplements you use. Also tell them if you smoke, drink alcohol, or use illegal drugs. Some items may interact with your medicine. What should I watch for while using this medicine? Visit your doctor or health care professional for regular checks on your progress. Your doctor or health care professional may order blood tests and other tests to see how you are doing. Call your doctor or health care professional if you get a cold or other infection while receiving this medicine. Do not treat yourself. This medicine may decrease your body's ability to fight infection. You should make sure you get enough calcium and vitamin D while you are taking this medicine, unless your doctor tells you not to. Discuss the foods you eat and the vitamins you take with your health care professional. See your dentist regularly. Brush and floss your teeth as directed. Before you have any dental work done, tell your dentist you are receiving this medicine. Do not become pregnant while taking this medicine or for 5 months after stopping it. Women should inform their doctor if they wish to become pregnant or think they might be pregnant. There is a potential for serious side effects to an unborn child. Talk to your health care professional or pharmacist for more information. What side effects may I notice from receiving this medicine? Side effects that you should report to your doctor or health care professional as soon as possible: -allergic reactions like skin rash, itching or hives, swelling of the face, lips, or tongue -breathing problems -chest pain -fast, irregular heartbeat -feeling faint or lightheaded, falls -fever, chills, or any other sign of infection -muscle spasms, tightening, or twitches -numbness or tingling -skin blisters or bumps, or is dry, peels, or red -slow healing or  unexplained pain in the mouth or jaw -unusual bleeding or bruising Side effects that usually do not require medical attention (Report these to your doctor or health care professional if they continue or are bothersome.): -muscle pain -stomach upset, gas This list may not describe all possible side effects. Call your doctor for medical advice about side effects. You may report side effects to FDA at 1-800-FDA-1088. Where should I keep my medicine? This medicine is only given in a clinic, doctor's office, or other health care setting and will not be stored at home. NOTE: This sheet is a summary. It may not cover all possible information. If you have questions about this medicine, talk to your doctor, pharmacist, or health care provider.    2016, Elsevier/Gold Standard. (2012-05-28 12:37:47)      Thank you for choosing [place] at [facility] to provide your oncology and hematology care.  To afford each patient quality time with our provider, please arrive at least 15 minutes before your scheduled appointment time.   Beginning January 23rd 2017 lab work for the [facility] will be done in the  Main lab at [facility] on 1st floor. If you have a lab appointment with the [facility] please  come in thru the  Main Entrance and check in at the main information desk  You need to re-schedule your appointment should you arrive 10 or more minutes late.  We strive to give you quality time with our providers, and arriving late affects you and other patients whose appointments are after yours.  Also, if you no show three or more times for appointments you may be dismissed from the clinic at the providers discretion.     Again, thank you for choosing [facility].  Our hope is that these requests will decrease the amount of time that you wait before being seen by our physicians.       _____________________________________________________________  Should you have  questions after your visit to [facility], please contact our office at [phone] between the hours of 8:30 a.m. and 4:30 p.m.  Voicemails left after 4:30 p.m. will not be returned until the following business day.  For prescription refill requests, have your pharmacy contact our office.         Resources For Cancer Patients and their Caregivers ? American Cancer Society: Can assist with transportation, wigs, general needs, runs Look Good Feel Better.        (684)165-9871 ? Cancer Care: Provides financial assistance, online support groups, medication/co-pay assistance.  1-800-813-HOPE 548-607-4334) ? [facility] Assists [place] cancer patients and their families through emotional , educational and financial support.  939-446-4941 ? [place] DSS Where to apply for food stamps, Medicaid and utility assistance. 8472755463 ? [facility]: Transportation to medical appointments. 650-365-0422 ? Social Security Administration: May apply for disability if have a Stage IV cancer. 630-148-4442 810-459-2559 ? [employer], Disability and Transit Services: Assists with nutrition, care and transit needs. 220-598-5332

## 2016-03-09 NOTE — Progress Notes (Signed)
Walter Boone's reason for visit today is for an injection and labs as scheduled per MD orders.  Labs were drawn prior to administration of ordered medication.    Walter Boone also received lupron in right upper outer quadrant  per MD orders; see [facility] for administration details.  Walter Boone tolerated all procedures well and without incident; questions were answered and patient was discharged.

## 2016-03-09 NOTE — Progress Notes (Signed)
Worthy Rancher, MD [address] [place] [address]   DIAGNOSIS: Prostate cancer   Staging form: Prostate, AJCC 7th Edition     Clinical: Stage IIC (pT2c, N0, M0) - Signed by Baird Cancer, PA on 06/20/2011  CURRENT THERAPY: [name] and Casodex ([name] 08/15/2012)  Osteopenia on bone density on 02/2014 Last imaging studies including bone scan, CT chest abdomen and pelvis performed in August 2013 and negative.        INTERVAL HISTORY: Walter Boone 65 y.o. male returns for follow-up of adenocarcinoma of the prostate. The patient he was treated with definitive radiation therapy several years ago. He then developed a climb in his PSA. He was started on [name] in 2012. Casodex was added in October 2015 when his PSA climbed from 0.19 to 0.48 pg/ml. He has had problems with hot flashes and bilateral gynecomastia. He also has had problems with insomnia since starting therapy.   Walter Boone was here alone today.  He said he is not doing too well today. He is having a lot of back and knee pain. He says that because of his knee pain he sometimes has to drag his leg because he can hardly walk at work. He also said that sometimes he has a sharp pain and it suddenly gives out without warning. He falls quite often due to this.Two nights ago he only slept an hour and a half because his knee hurt any way he laid in bed. Today he said that the pain is not too bad.  His PCP, Dr. Warrick Parisian, wants him to do a knee replacement but he would rather not do that because his 3 sisters did one and 2 of them still have problems.   He said his appetite is good and that his PCP wants him to go on a diet and to lose 15 to 20 pounds. Weight is down 20 pounds over the past year but he notes he has tried to loose weight. He is trying to avoid diabetic medications.  His recent bone density scan showed osteopenia. We discussed giving him a Prolia injection twice a year for this. I advised him  to go to the dentist before he starts this. He does not take calcium or vitamin D now, but was told to take these every day.   MEDICAL HISTORY: Past Medical History  Diagnosis Date  . Prostate cancer ([place])   . Fatty liver   . Hypertension   . Hyperlipidemia   . Hemorrhoids     history  . Prostate cancer ([place]) 06/20/2011  . Prostate carcinoma, recurrent ([place]) 08/10/2012  . Gynecomastia, male 01/11/2013    Secondary to prostate ca Tx.   Marland Kitchen History of back injury 11/19/2013  . High cholesterol   . Hyperglycemia   . H/O Bell's palsy     has H N P-CERVICAL; CERVICAL SPASM; Prostate cancer ([place]); Gynecomastia, male; Chest pain; Hypertension; Hyperlipidemia; Chest pain at rest; Osteopenia determined by x-ray; Prediabetes; and Knee pain, left on his problem list.     is allergic to tylenol.  Walter Boone had no medications administered during this visit.  SURGICAL HISTORY: Past Surgical History  Procedure Laterality Date  . Prostate biopsy  11/06  . Esophagogastroduodenoscopy    . Knee surgery      left  . Ankle surgery  1987    left  . Hernia repair  2010  . Colonoscopy  2008  . I&d extremity Left 06/10/2015  Procedure: IRRIGATION AND DEBRIDEMENT EXTREMITY LEFT HAND EXPLORATION, nerve repair;  Surgeon: Charlotte Crumb, MD;  Location: [facility];  Service: Orthopedics;  Laterality: Left;    SOCIAL HISTORY: Social History   Social History  . Marital Status: Married    Spouse Name: N/A  . Number of Children: N/A  . Years of Education: N/A   Occupational History  . Not on file.   Social History Main Topics  . Smoking status: Former Smoker -- 2.50 packs/day for 3 years  . Smokeless tobacco: Never Used  . Alcohol Use: No     Comment: former drinker 20 years ago  . Drug Use: No  . Sexual Activity: Not on file   Other Topics Concern  . Not on file   Social History Narrative    FAMILY HISTORY: Family History  Problem Relation Age of Onset  . Diabetes Father   . Cancer  Sister   . Diabetes Brother   . Heart failure Mother     Review of Systems  Constitutional: Negative. HENT: Negative.   Eyes: Negative.   Respiratory: Negative. Cardiovascular: Negative.   Gastrointestinal: Negative.   Genitourinary: Negative.   Musculoskeletal: Positive for back pain and joint pain.  Chronic, work related. Has to drag his leg sometimes at work secondary to knee pain; frequently falls because knee gives out. Skin: Negative.   Neurological: Negative for dizziness, tingling, tremors, sensory change, speech change, focal weakness, seizures and loss of consciousness.  Endo/Heme/Allergies: Negative.   Psychiatric/Behavioral: Negative.   14 point review of systems was performed and is negative except as detailed under history of present illness and above   PHYSICAL EXAMINATION  ECOG PERFORMANCE STATUS: 1 - Symptomatic but completely ambulatory  Filed Vitals:   03/09/16 1240  BP: 152/75  Pulse: 68  Temp: 97.7 F (36.5 C)  Resp: 18    Physical Exam  Constitutional: He is oriented to person, place, and time and well-developed, well-nourished, and in no distress.  Large lipoma on the back of his neck. HENT:  Head: Normocephalic and atraumatic.  Nose: Nose normal.  Mouth/Throat: Oropharynx is clear and moist. No oropharyngeal exudate.  Eyes: Conjunctivae and EOM are normal. Pupils are equal, round, and reactive to light. Right eye exhibits no discharge. Left eye exhibits no discharge. No scleral icterus.  Neck: Normal range of motion. Neck supple. No tracheal deviation present. No thyromegaly present.  Cardiovascular: Normal rate, regular rhythm and normal heart sounds.  Exam reveals no gallop and no friction rub.   No murmur heard. Pulmonary/Chest: Effort normal and breath sounds normal. He has no wheezes. He has no rales.  Abdominal: Soft. Bowel sounds are normal. He exhibits no distension and no mass. There is no tenderness. There is no rebound and no guarding.   Musculoskeletal: Normal range of motion. He exhibits no edema.  Lymphadenopathy:    He has no cervical adenopathy.  Neurological: He is alert and oriented to person, place, and time. He has normal reflexes. No cranial nerve deficit. Gait normal. Coordination normal.  Skin: Skin is warm and dry. No rash noted.  Psychiatric: Mood, memory, affect and judgment normal.  Nursing note and vitals reviewed.   LABORATORY DATA: I have reviewed the data as listed.  CBC    Component Value Date/Time   WBC 7.8 12/09/2015 1032   RBC 4.31 12/09/2015 1032   HGB 13.6 12/09/2015 1032   HCT 40.0 12/09/2015 1032   PLT 388 12/09/2015 1032   MCV 92.8 12/09/2015 1032  MCH 31.6 12/09/2015 1032   MCHC 34.0 12/09/2015 1032   RDW 12.8 12/09/2015 1032   LYMPHSABS 2.6 12/09/2015 1032   MONOABS 0.4 12/09/2015 1032   EOSABS 0.3 12/09/2015 1032   BASOSABS 0.1 12/09/2015 1032   CMP     Component Value Date/Time   NA 141 03/08/2016 1033   NA 140 11/11/2015 1547   K 3.2* 03/08/2016 1033   CL 104 03/08/2016 1033   CO2 27 03/08/2016 1033   GLUCOSE 122* 03/08/2016 1033   GLUCOSE 151* 11/11/2015 1547   BUN 24* 03/08/2016 1033   BUN 23 11/11/2015 1547   CREATININE 0.87 03/08/2016 1033   CALCIUM 9.1 03/08/2016 1033   PROT 7.2 03/08/2016 1033   PROT 7.1 11/11/2015 1547   ALBUMIN 4.2 03/08/2016 1033   ALBUMIN 4.3 11/11/2015 1547   AST 28 03/08/2016 1033   ALT 23 03/08/2016 1033   ALKPHOS 51 03/08/2016 1033   BILITOT 0.6 03/08/2016 1033   BILITOT <0.2 11/11/2015 1547   GFRNONAA >60 03/08/2016 1033   GFRAA >60 03/08/2016 1033   Results for AMAHD, BRAKEMAN (MRN [id number])   Ref. Range 09/29/2014 09:41 10/29/2014 08:34 01/12/2015 12:10 05/20/2015 09:49 06/10/2015 18:55 09/09/2015 09:46 12/09/2015 10:32 03/08/2016 10:33  PSA Latest Ref Range: 0.00-4.00 ng/mL 0.48 0.25 0.40 0.29 0.50 0.40 0.37 0.55    Radiography: I have personally reviewed the radiological images as listed and agreed with the findings in  the report.  Study Result     EXAM: DUAL X-RAY ABSORPTIOMETRY (DXA) FOR BONE MINERAL DENSITY  IMPRESSION: Ordering Physician: Dr. Patrici Ranks,  Your patient Walter Boone completed a BMD test on 01/12/2016 using the [place] (software version: 14.10) manufactured by [employer]. The following summarizes the results of our evaluation. PATIENT BIOGRAPHICAL: Name: Walter Boone, Walter Boone Patient ID: [id number] Birth Date: September 12, 1951 Height: 66.0 in. Gender: Male Exam Date: 01/12/2016 Weight: 210.0 lbs. Indications: Follow up Osteopenia, Height Loss, History of Fracture (Adult), Low Calcium Intake, Prostate Cancer, Recurrent Falls Fractures: Wrist Treatments: Multivitamin DENSITOMETRY RESULTS: Site Region Measured Date Measured Age WHO Classification Young Adult T-score BMD %Change vs. Previous Significant Change (*) AP Spine L1-L4 01/12/2016 62.6 N/A -0.4 1.171 g/cm2 5.1% - AP Spine L1-L4 02/24/2014 60.7 N/A -0.9 1.114 g/cm2 - -  DualFemur Neck Left 01/12/2016 62.6 N/A -1.8 0.838 g/cm2 -3.1% - DualFemur Neck Left 02/24/2014 60.7 N/A -1.6 0.865 g/cm2 - - ASSESSMENT: This patient is considered osteopenic by World Healh Organization (WHO) Criteria. Compared with the prior study on 02/24/2014, the BMD of the (lumbar spine/total hip/femoral neck) show (no statistically significant change ).  World Pharmacologist (WHO) criteria for post-menopausal, Caucasian Women: Normal: T-score at or above -1 SD Osteopenia: T-score between -1 and -2.5 SD Osteoporosis: T-score at or below -2.5 SD  RECOMMENDATIONS: [place] recommends that FDA-approved medial therapies be considered in postmenopausal women and men age 96 or older with a: 1. Hip or vertberal (clinical or morphometric) fracture. 2. T-Score of < -2.5 at the spine or hip. 3. Ten-year fracture probability by FRAX of 3% or greater for  hip fracture or 20% or greater for major osteoporotic fracture.  All treatment decisions require clinical judgment and consideration of indiviual patient factors, including patient preferences, co-morbidities, previous drug use, risk factors not captured in the FRAX model (e.g. falls, vitamin D deficiency, increased bone turnover, interval significant decline in bone density) and possible under-or over-estimation of fracture risk by FRAX.  All patients should ensure an adequate  intake of dietary calcium (1200 mg/d) and vitamin D (800 IU daily) unless contraindicated. FOLLOW-UP: People with diagnosed cases of osteoporosis or osteopenia should be regularly tested for bone mineral density. For patients eligible for Medicare, routine testing is allowed once every 2 years. Testing frequency can be increased for patients who have rapidly progressing disease, or for those who are receiving medical therapy to restore bone mass.  I have reviewed this report, and agree with the above findings.  [facility] Dear Dr. Patrici Ranks,  Your patient Walter Boone completed a FRAX assessment on 01/12/2016 using the [place] (analysis version: 14.10) manufactured by [employer]. The following summarizes the results of our evaluation.  PATIENT BIOGRAPHICAL: Name: Walter Boone, Walter Boone Patient ID: [id number] Birth Date: 09/23/1951 Height: 66.0 in. Gender: Male Age: 4.6 Weight: 210.0 lbs. Ethnicity: Hispanic Exam Date: 01/12/2016  FRAX* RESULTS: (version: 3.5) 10-year Probability of Fracture1 Major Osteoporotic Fracture2 Hip Fracture 6.1% 0.9% Population: [place] (Hispanic) Risk Factors: History of Fracture (Adult)  Based on Femur (Left) Neck BMD  1 -The 10-year probability of fracture may be lower than reported if the patient has received treatment. 2 -Major Osteoporotic Fracture:  Clinical Spine, Forearm, Hip or Shoulder  *FRAX is a Materials engineer of the [employer] for Metabolic Bone Disease, a [place] (WHO) [facility].  ASSESSMENT: The probability of a major osteoporotic fracture is 6.1% within the next ten years.  The probability of a hip fracture is 0.9% within the next ten years.   Electronically Signed  By: Marin Olp M.D.  On: 01/12/2016 10:08     ASSESSMENT and THERAPY PLAN:   Adenocarcinoma of prostate, biochemical recurrence [name] and casodex Hypokalemia Osteopenia  He is on [name] given every 3 months.  His PSA fluctuates and will need ongoing close observation. PSA is up and I have recommended restaging studies. He has lost 20 pounds over the past year although he states he hast been trying. I will advise him of results of staging studies once available.   I again addressed his bone health. I have encouraged calcium and vitamin D. (1000 mg daily and at least 1000 to 2000 IU daily)  Vitamin D level will be check at follow-up.   He will return for a follow up in 3 months unless his scans are abnormal.  We will follow up with Walter Boone in 3 months with labs and [name].  Orders Placed This Encounter  Procedures  . CT Abdomen Pelvis W Contrast    Standing Status: Future     Number of Occurrences:      Standing Expiration Date: 03/09/2017    Order Specific Question:  If indicated for the ordered procedure, I authorize the administration of contrast media per Radiology protocol    Answer:  Yes    Order Specific Question:  Reason for Exam (SYMPTOM  OR DIAGNOSIS REQUIRED)    Answer:  restaging, prostate cancer, rising psa    Order Specific Question:  Preferred imaging location?    Answer:  [facility]  . CT Chest W Contrast    Standing Status: Future     Number of Occurrences:      Standing Expiration Date: 03/09/2017    Order Specific Question:  If indicated  for the ordered procedure, I authorize the administration of contrast media per Radiology protocol    Answer:  Yes    Order Specific Question:  Reason for Exam (SYMPTOM  OR DIAGNOSIS  REQUIRED)    Answer:  prostate cancer, rising PSA, restaging    Order Specific Question:  Preferred imaging location?    Answer:  [facility]    Standing Status: Future     Number of Occurrences:      Standing Expiration Date: 03/09/2017    Order Specific Question:  Reason for Exam (SYMPTOM  OR DIAGNOSIS REQUIRED)    Answer:  prostate cancer, rising psa restaging    Order Specific Question:  Preferred imaging location?    Answer:  [facility]    Order Specific Question:  If indicated for the ordered procedure, I authorize the administration of a radiopharmaceutical per Radiology protocol    Answer:  Yes  . Vitamin D 25 hydroxy    Standing Status: Future     Number of Occurrences:      Standing Expiration Date: 03/09/2017  . Comprehensive metabolic panel    Standing Status: Standing     Number of Occurrences: 6     Standing Expiration Date: 03/10/2019     All questions were answered. The patient knows to call the clinic with any problems, questions or concerns. We can certainly see the patient much sooner if necessary.  This document serves as a record of services personally performed by Ancil Linsey, MD. It was created on her behalf by Kandace Blitz, a trained medical scribe. The creation of this record is based on the scribe's personal observations and the provider's statements to them. This document has been checked and approved by the attending provider.  I have reviewed the above documentation for accuracy and completeness, and I agree with the above. Molli Hazard, MD

## 2016-03-16 ENCOUNTER — Encounter (HOSPITAL_COMMUNITY): Payer: Medicaid Other

## 2016-03-16 ENCOUNTER — Ambulatory Visit (HOSPITAL_COMMUNITY)
Admission: RE | Admit: 2016-03-16 | Discharge: 2016-03-16 | Disposition: A | Discharge status: Home / Self Care | Payer: Medicaid Other | Source: Ambulatory Visit | Attending: Hematology & Oncology | Admitting: Hematology & Oncology

## 2016-03-16 DIAGNOSIS — C61 Malignant neoplasm of prostate: Secondary | ICD-10-CM | POA: Diagnosis present

## 2016-03-16 DIAGNOSIS — K5730 Diverticulosis of large intestine without perforation or abscess without bleeding: Secondary | ICD-10-CM | POA: Insufficient documentation

## 2016-03-16 DIAGNOSIS — R972 Elevated prostate specific antigen [PSA]: Secondary | ICD-10-CM

## 2016-03-16 MED ORDER — IOPAMIDOL (ISOVUE-300) INJECTION 61%
100.0000 mL | Freq: Once | INTRAVENOUS | Status: AC | PRN
  Administered 2016-03-16: 100 mL via INTRAVENOUS

## 2016-03-16 MED ORDER — DIATRIZOATE MEGLUMINE & SODIUM 66-10 % PO SOLN
ORAL | Status: AC
  Filled 2016-03-16: qty 30

## 2016-03-21 ENCOUNTER — Ambulatory Visit: Payer: Medicaid Other | Admitting: Family Medicine

## 2016-03-22 ENCOUNTER — Encounter: Payer: Self-pay | Admitting: Family Medicine

## 2016-03-28 ENCOUNTER — Other Ambulatory Visit: Payer: Self-pay | Admitting: *Deleted

## 2016-03-28 MED ORDER — HYDROCHLOROTHIAZIDE 25 MG PO TABS: 25.0000 mg | ORAL_TABLET | Freq: Every day | ORAL | Status: DC

## 2016-03-31 ENCOUNTER — Ambulatory Visit (INDEPENDENT_AMBULATORY_CARE_PROVIDER_SITE_OTHER): Payer: Medicaid Other | Admitting: Family Medicine

## 2016-03-31 ENCOUNTER — Encounter: Payer: Self-pay | Admitting: Family Medicine

## 2016-03-31 VITALS — BP 138/84 | HR 75 | Temp 98.0°F | Ht 67.0 in | Wt 199.2 lb

## 2016-03-31 DIAGNOSIS — C61 Malignant neoplasm of prostate: Secondary | ICD-10-CM

## 2016-03-31 DIAGNOSIS — I10 Essential (primary) hypertension: Secondary | ICD-10-CM | POA: Diagnosis not present

## 2016-03-31 MED ORDER — HYDROCODONE-ACETAMINOPHEN 5-325 MG PO TABS: 1.0000 | ORAL_TABLET | Freq: Four times a day (QID) | ORAL | Status: DC | PRN

## 2016-03-31 MED ORDER — AMLODIPINE BESYLATE 10 MG PO TABS
10.0000 mg | ORAL_TABLET | Freq: Every day | ORAL | Status: DC
Start: 2016-03-31 — End: 2016-06-30

## 2016-03-31 NOTE — Progress Notes (Signed)
BP 138/84 mmHg  Pulse 75  Temp(Src) 98 F (36.7 C) (Oral)  Ht 5\' 7"  (1.702 m)  Wt 199 lb 3.2 oz (90.357 kg)  BMI 31.19 kg/m2   Subjective:    [name], male    DOB: [birth year], 65 y.o.   MRN: [id number]  HPI: Walter Boone is a 65 y.o. male presenting on 2020/03/1416 for Medication Refills and Discuss labwork   HPI Hypertension recheck Patient is coming today for a hypertension recheck. His blood pressure today is 138/84. On his last 2 labs his potassium has been a little bit low in the 3.3 and then 3.2 range. He has been on hydrochlorothiazide. Patient denies headaches, blurred vision, chest pains, shortness of breath, or weakness. Denies any side effects from medication and is content with current medication.   Prostate cancer/back pain Patient is currently being treated for prostate cancer and has some occasional back pain mostly at night. He has been using the hydrocodone occasionally at night when his back pain is bad enough that keeps him from sleeping but has not had to use it every day or even every other day.  Relevant past medical, surgical, family and social history reviewed and updated as indicated. Interim medical history since our last visit reviewed. Allergies and medications reviewed and updated.  Review of Systems  Constitutional: Negative for fever.  HENT: Negative for ear discharge and ear pain.   Eyes: Negative for discharge and visual disturbance.  Respiratory: Negative for shortness of breath and wheezing.   Cardiovascular: Negative for chest pain and leg swelling.  Gastrointestinal: Negative for abdominal pain, diarrhea and constipation.  Genitourinary: Negative for difficulty urinating.  Musculoskeletal: Positive for arthralgias. Negative for back pain and gait problem.  Skin: Negative for color change and rash.  Neurological: Negative for dizziness, syncope, light-headedness and headaches.  All other systems reviewed and are  negative.   Per HPI unless specifically indicated above     Medication List       This list is accurate as of: 03/31/16 12:01 PM.  Always use your most recent med list.               amLODipine 10 MG tablet  Commonly known as:  NORVASC  Take 1 tablet (10 mg total) by mouth daily.     bicalutamide 50 MG tablet  Commonly known as:  CASODEX  Take 1 tablet (50 mg total) by mouth daily.     HYDROcodone-acetaminophen 5-325 MG tablet  Commonly known as:  NORCO/VICODIN  Take 1 tablet by mouth every 6 (six) hours as needed for moderate pain.     [name] IJ  Inject as directed every 3 (three) months.     Melatonin 5 MG Tabs  Take 5 mg by mouth.     MULTIVITAMIN GUMMIES ADULT Chew  Chew by mouth daily. Reported on 03/09/2016     potassium chloride SA 20 MEQ tablet  Commonly known as:  K-DUR,KLOR-CON  Take 1 tablet (20 mEq total) by mouth 2 (two) times daily.           Objective:    BP 138/84 mmHg  Pulse 75  Temp(Src) 98 F (36.7 C) (Oral)  Ht 5\' 7"  (1.702 m)  Wt 199 lb 3.2 oz (90.357 kg)  BMI 31.19 kg/m2  Wt Readings from Last 3 Encounters:  03/31/16 199 lb 3.2 oz (90.357 kg)  03/09/16 200 lb (90.719 kg)  02/11/16 207 lb 9.6 oz (94.167 kg)  Physical Exam  Constitutional: He is oriented to person, place, and time. He appears well-developed and well-nourished. No distress.  Eyes: Conjunctivae and EOM are normal. Pupils are equal, round, and reactive to light. Right eye exhibits no discharge. No scleral icterus.  Neck: Neck supple. No thyromegaly present.  Cardiovascular: Normal rate, regular rhythm, normal heart sounds and intact distal pulses.   No murmur heard. Pulmonary/Chest: Effort normal and breath sounds normal. No respiratory distress. He has no wheezes.  Musculoskeletal: Normal range of motion. He exhibits no edema.  Lymphadenopathy:    He has no cervical adenopathy.  Neurological: He is alert and oriented to person, place, and time. Coordination  normal.  Skin: Skin is warm and dry. No rash noted. He is not diaphoretic.  Psychiatric: He has a normal mood and affect. His behavior is normal.  Nursing note and vitals reviewed.   Results for orders placed or performed in visit on 03/08/16  Comprehensive metabolic panel  Result Value Ref Range   Sodium 141 135 - 145 mmol/L   Potassium 3.2 (L) 3.5 - 5.1 mmol/L   Chloride 104 101 - 111 mmol/L   CO2 27 22 - 32 mmol/L   Glucose, Bld 122 (H) 65 - 99 mg/dL   BUN 24 (H) 6 - 20 mg/dL   Creatinine, Ser 0.87 0.61 - 1.24 mg/dL   Calcium 9.1 8.9 - 10.3 mg/dL   Total Protein 7.2 6.5 - 8.1 g/dL   Albumin 4.2 3.5 - 5.0 g/dL   AST 28 15 - 41 U/L   ALT 23 17 - 63 U/L   Alkaline Phosphatase 51 38 - 126 U/L   Total Bilirubin 0.6 0.3 - 1.2 mg/dL   GFR calc non Af Amer >60 >60 mL/min   GFR calc Af Amer >60 >60 mL/min   Anion gap 10 5 - 15  PSA  Result Value Ref Range   PSA 0.55 0.00 - 4.00 ng/mL      Assessment & Plan:   Problem List Items Addressed This Visit      Cardiovascular and Mediastinum   Hypertension    Switched to amlodipine because of hypokalemia, come back in 4 weeks and recheck her blood pressure.      Relevant Medications   amLODipine (NORVASC) 10 MG tablet     Genitourinary   Prostate cancer ([place]) - Primary   Relevant Medications   HYDROcodone-acetaminophen (NORCO/VICODIN) 5-325 MG tablet       Follow up plan: Return in about 4 weeks (around 04/28/2016), or if symptoms worsen or fail to improve, for Hypertension recheck.  Counseling provided for all of the vaccine components No orders of the defined types were placed in this encounter.    Caryl Pina, MD [facility] 2020/02/1416, 12:01 PM

## 2016-03-31 NOTE — Assessment & Plan Note (Signed)
Switched to amlodipine because of hypokalemia, come back in 4 weeks and recheck her blood pressure.

## 2016-04-15 ENCOUNTER — Encounter: Payer: Self-pay | Admitting: Family Medicine

## 2016-04-15 ENCOUNTER — Ambulatory Visit (INDEPENDENT_AMBULATORY_CARE_PROVIDER_SITE_OTHER): Payer: Medicaid Other | Admitting: Family Medicine

## 2016-04-15 ENCOUNTER — Encounter (INDEPENDENT_AMBULATORY_CARE_PROVIDER_SITE_OTHER): Payer: Self-pay

## 2016-04-15 ENCOUNTER — Telehealth: Payer: Self-pay | Admitting: Family Medicine

## 2016-04-15 VITALS — BP 135/81 | HR 80 | Temp 97.5°F | Ht 67.0 in | Wt 195.8 lb

## 2016-04-15 DIAGNOSIS — H8110 Benign paroxysmal vertigo, unspecified ear: Secondary | ICD-10-CM

## 2016-04-15 MED ORDER — MECLIZINE HCL 25 MG PO TABS: 25.0000 mg | ORAL_TABLET | Freq: Three times a day (TID) | ORAL | Status: DC | PRN

## 2016-04-15 NOTE — Patient Instructions (Signed)
Great to meet you!  Try the exercises in your bed at home  Also try the meclizine it can really help some people  Benign Positional Vertigo Vertigo is the feeling that you or your surroundings are moving when they are not. Benign positional vertigo is the most common form of vertigo. The cause of this condition is not serious (is benign). This condition is triggered by certain movements and positions (is positional). This condition can be dangerous if it occurs while you are doing something that could endanger you or others, such as driving.  CAUSES In many cases, the cause of this condition is not known. It may be caused by a disturbance in an area of the inner ear that helps your brain to sense movement and balance. This disturbance can be caused by a viral infection (labyrinthitis), head injury, or repetitive motion. RISK FACTORS This condition is more likely to develop in:  Women.  People who are 8 years of age or older. SYMPTOMS Symptoms of this condition usually happen when you move your head or your eyes in different directions. Symptoms may start suddenly, and they usually last for less than a minute. Symptoms may include:  Loss of balance and falling.  Feeling like you are spinning or moving.  Feeling like your surroundings are spinning or moving.  Nausea and vomiting.  Blurred vision.  Dizziness.  Involuntary eye movement (nystagmus). Symptoms can be mild and cause only slight annoyance, or they can be severe and interfere with daily life. Episodes of benign positional vertigo may return (recur) over time, and they may be triggered by certain movements. Symptoms may improve over time. DIAGNOSIS This condition is usually diagnosed by medical history and a physical exam of the head, neck, and ears. You may be referred to a health care provider who specializes in ear, nose, and throat (ENT) problems (otolaryngologist) or a provider who specializes in disorders of the nervous  system (neurologist). You may have additional testing, including:  MRI.  A CT scan.  Eye movement tests. Your health care provider may ask you to change positions quickly while he or she watches you for symptoms of benign positional vertigo, such as nystagmus. Eye movement may be tested with an electronystagmogram (ENG), caloric stimulation, the Dix-Hallpike test, or the roll test.  An electroencephalogram (EEG). This records electrical activity in your brain.  Hearing tests. TREATMENT Usually, your health care provider will treat this by moving your head in specific positions to adjust your inner ear back to normal. Surgery may be needed in severe cases, but this is rare. In some cases, benign positional vertigo may resolve on its own in 2-4 weeks. HOME CARE INSTRUCTIONS Safety  Move slowly.Avoid sudden body or head movements.  Avoid driving.  Avoid operating heavy machinery.  Avoid doing any tasks that would be dangerous to you or others if a vertigo episode would occur.  If you have trouble walking or keeping your balance, try using a cane for stability. If you feel dizzy or unstable, sit down right away.  Return to your normal activities as told by your health care provider. Ask your health care provider what activities are safe for you. General Instructions  Take over-the-counter and prescription medicines only as told by your health care provider.  Avoid certain positions or movements as told by your health care provider.  Drink enough fluid to keep your urine clear or pale yellow.  Keep all follow-up visits as told by your health care provider. This is  important. SEEK MEDICAL CARE IF:  You have a fever.  Your condition gets worse or you develop new symptoms.  Your family or friends notice any behavioral changes.  Your nausea or vomiting gets worse.  You have numbness or a "pins and needles" sensation. SEEK IMMEDIATE MEDICAL CARE IF:  You have difficulty  speaking or moving.  You are always dizzy.  You faint.  You develop severe headaches.  You have weakness in your legs or arms.  You have changes in your hearing or vision.  You develop a stiff neck.  You develop sensitivity to light.   This information is not intended to replace advice given to you by your health care provider. Make sure you discuss any questions you have with your health care provider.   Document Released: 09/05/2006 Document Revised: 08/19/2015 Document Reviewed: 03/23/2015 Elsevier Interactive Patient Education [employer].

## 2016-04-15 NOTE — Progress Notes (Signed)
   HPI  Patient presents today here with dizziness.  Patient's lines are last 2 days he's had severe episodes of dizziness. He describes room spinning sensation with moving his head upward and occasionally to the side. He states that this started suddenly without obvious cause. He denies any numbness, weakness, trouble talking, or trouble walking.  He initially believed that this was due to Norvasc, however he has not had any symptoms on standing consistent with orthostatic hypotension  He denies any fever, ear pain, or other concerns  PMH: Smoking status noted ROS: Per HPI  Objective: BP 135/81 mmHg  Pulse 80  Temp(Src) 97.5 F (36.4 C) (Oral)  Ht 5\' 7"  (1.702 m)  Wt 195 lb 12.8 oz (88.814 kg)  BMI 30.66 kg/m2 Gen: NAD, alert, cooperative with exam HEENT: NCAT CV: RRR, good S1/S2, no murmur Resp: CTABL, no wheezes, non-labored Ext: No edema, warm Neuro: Alert and oriented, No gross deficits  Assessment and plan:  # Benign paroxysmal vertigo Discussed Epley's maneuvers and reviewed exercises using a video Trial of Meclizine Return to clinic with any concerns, failure to improve     Meds ordered this encounter  Medications  . meclizine (ANTIVERT) 25 MG tablet    Sig: Take 1 tablet (25 mg total) by mouth 3 (three) times daily as needed for dizziness.    Dispense:  30 tablet    Refill:  0    Laroy Apple, MD [facility] 04/15/2016, 6:19 PM

## 2016-04-15 NOTE — Telephone Encounter (Signed)
Pt given appt this afternoon with Dr.Bradshaw in the afterhours clinic. Pt will have wife drive him.

## 2016-04-22 LAB — BASIC METABOLIC PANEL: Glucose: 97 mg/dL

## 2016-05-05 ENCOUNTER — Encounter: Payer: Self-pay | Admitting: Family Medicine

## 2016-05-05 ENCOUNTER — Ambulatory Visit (INDEPENDENT_AMBULATORY_CARE_PROVIDER_SITE_OTHER): Payer: Medicaid Other | Admitting: Family Medicine

## 2016-05-05 VITALS — BP 133/85 | HR 83 | Temp 97.6°F | Ht 67.0 in | Wt 197.0 lb

## 2016-05-05 DIAGNOSIS — I10 Essential (primary) hypertension: Secondary | ICD-10-CM

## 2016-05-05 NOTE — Progress Notes (Signed)
BP 133/85 mmHg  Pulse 83  Temp(Src) 97.6 F (36.4 C) (Oral)  Ht 5\' 7"  (1.702 m)  Wt 197 lb (89.359 kg)  BMI 30.85 kg/m2   Subjective:    [name], male    DOB: 08/31/1951, 65 y.o.   MRN: [id number]  HPI: Walter Boone is a 65 y.o. male presenting on 05/05/2016 for Hypertension   HPI Hypertension recheck Patient is coming in today for hypertension recheck. His blood pressure today is 133/85. He is currently on amlodipine 10 mg. Patient denies headaches, blurred vision, chest pains, shortness of breath, or weakness. Denies any side effects from medication and is content with current medication.   Relevant past medical, surgical, family and social history reviewed and updated as indicated. Interim medical history since our last visit reviewed. Allergies and medications reviewed and updated.  Review of Systems  Constitutional: Negative for fever.  HENT: Negative for ear discharge and ear pain.   Eyes: Negative for discharge and visual disturbance.  Respiratory: Negative for chest tightness, shortness of breath and wheezing.   Cardiovascular: Negative for chest pain and leg swelling.  Gastrointestinal: Negative for abdominal pain, diarrhea and constipation.  Genitourinary: Negative for difficulty urinating.  Musculoskeletal: Negative for back pain and gait problem.  Skin: Negative for rash.  Neurological: Negative for dizziness, syncope, light-headedness and headaches.  All other systems reviewed and are negative.   Per HPI unless specifically indicated above     Medication List       This list is accurate as of: 05/05/16  4:53 PM.  Always use your most recent med list.               amLODipine 10 MG tablet  Commonly known as:  NORVASC  Take 1 tablet (10 mg total) by mouth daily.     bicalutamide 50 MG tablet  Commonly known as:  CASODEX  Take 1 tablet (50 mg total) by mouth daily.     HYDROcodone-acetaminophen 5-325 MG tablet  Commonly  known as:  NORCO/VICODIN  Take 1 tablet by mouth every 6 (six) hours as needed for moderate pain.     [name] IJ  Inject as directed every 3 (three) months.     Melatonin 5 MG Tabs  Take 5 mg by mouth.     MULTIVITAMIN GUMMIES ADULT Chew  Chew by mouth daily. Reported on 03/09/2016     potassium chloride SA 20 MEQ tablet  Commonly known as:  K-DUR,KLOR-CON  Take 1 tablet (20 mEq total) by mouth 2 (two) times daily.           Objective:    BP 133/85 mmHg  Pulse 83  Temp(Src) 97.6 F (36.4 C) (Oral)  Ht 5\' 7"  (1.702 m)  Wt 197 lb (89.359 kg)  BMI 30.85 kg/m2  Wt Readings from Last 3 Encounters:  05/05/16 197 lb (89.359 kg)  04/15/16 195 lb 12.8 oz (88.814 kg)  03/31/16 199 lb 3.2 oz (90.357 kg)    Physical Exam  Constitutional: He is oriented to person, place, and time. He appears well-developed and well-nourished. No distress.  Eyes: Conjunctivae and EOM are normal. Pupils are equal, round, and reactive to light. Right eye exhibits no discharge. No scleral icterus.  Neck: Neck supple. No thyromegaly present.  Cardiovascular: Normal rate, regular rhythm, normal heart sounds and intact distal pulses.   No murmur heard. Pulmonary/Chest: Effort normal and breath sounds normal. No respiratory distress. He has no wheezes.  Musculoskeletal: Normal range  of motion. He exhibits no edema.  Lymphadenopathy:    He has no cervical adenopathy.  Neurological: He is alert and oriented to person, place, and time. Coordination normal.  Skin: Skin is warm and dry. No rash noted. He is not diaphoretic.  Psychiatric: He has a normal mood and affect. His behavior is normal.  Nursing note and vitals reviewed.   Results for orders placed or performed in visit on 03/08/16  Comprehensive metabolic panel  Result Value Ref Range   Sodium 141 135 - 145 mmol/L   Potassium 3.2 (L) 3.5 - 5.1 mmol/L   Chloride 104 101 - 111 mmol/L   CO2 27 22 - 32 mmol/L   Glucose, Bld 122 (H) 65 - 99 mg/dL    BUN 24 (H) 6 - 20 mg/dL   Creatinine, Ser 0.87 0.61 - 1.24 mg/dL   Calcium 9.1 8.9 - 10.3 mg/dL   Total Protein 7.2 6.5 - 8.1 g/dL   Albumin 4.2 3.5 - 5.0 g/dL   AST 28 15 - 41 U/L   ALT 23 17 - 63 U/L   Alkaline Phosphatase 51 38 - 126 U/L   Total Bilirubin 0.6 0.3 - 1.2 mg/dL   GFR calc non Af Amer >60 >60 mL/min   GFR calc Af Amer >60 >60 mL/min   Anion gap 10 5 - 15  PSA  Result Value Ref Range   PSA 0.55 0.00 - 4.00 ng/mL      Assessment & Plan:   Problem List Items Addressed This Visit      Cardiovascular and Mediastinum   Hypertension - Primary    Doing well, continue medications and see back in 3 months          Follow up plan: Return in about 3 months (around 08/05/2016), or if symptoms worsen or fail to improve, for Hypertension recheck.  Counseling provided for all of the vaccine components No orders of the defined types were placed in this encounter.    Caryl Pina, MD [facility] 05/05/2016, 4:53 PM

## 2016-05-05 NOTE — Assessment & Plan Note (Signed)
Doing well, continue medications and see back in 3 months

## 2016-05-19 ENCOUNTER — Ambulatory Visit (INDEPENDENT_AMBULATORY_CARE_PROVIDER_SITE_OTHER): Payer: Medicaid Other | Admitting: Family Medicine

## 2016-05-19 ENCOUNTER — Encounter: Payer: Self-pay | Admitting: Family Medicine

## 2016-05-19 VITALS — BP 123/75 | HR 91 | Temp 97.7°F | Ht 67.0 in | Wt 199.4 lb

## 2016-05-19 DIAGNOSIS — M2540 Effusion, unspecified joint: Secondary | ICD-10-CM | POA: Diagnosis not present

## 2016-05-19 MED ORDER — PREDNISONE 20 MG PO TABS: ORAL_TABLET | ORAL | Status: DC

## 2016-05-19 NOTE — Progress Notes (Signed)
BP 123/75 mmHg  Pulse 91  Temp(Src) 97.7 F (36.5 C) (Oral)  Ht 5\' 7"  (1.702 m)  Wt 199 lb 6.4 oz (90.447 kg)  BMI 31.22 kg/m2   Subjective:    [name], male    DOB: November 25, 1951, 65 y.o.   MRN: [id number]  HPI: Walter Boone is a 65 y.o. male presenting on 05/19/2016 for Pain and swelling in right hand   HPI Swelling and pain in joints of right hand Patient is coming in today for swelling and pain that started up in the MCP joints of his right hand. It's in his first and second little bit of this third joint. The swelling is significant enough that he cannot close his hand completely on that side. He denies any warmth or redness or fevers or chills. There is no swelling or redness or pain in any of his other joints currently. He has never had this previously.  Relevant past medical, surgical, family and social history reviewed and updated as indicated. Interim medical history since our last visit reviewed. Allergies and medications reviewed and updated.  Review of Systems  Constitutional: Negative for fever.  HENT: Negative for ear discharge and ear pain.   Eyes: Negative for discharge and visual disturbance.  Respiratory: Negative for shortness of breath and wheezing.   Cardiovascular: Negative for chest pain and leg swelling.  Genitourinary: Negative for difficulty urinating.  Musculoskeletal: Positive for joint swelling and arthralgias. Negative for back pain and gait problem.  Skin: Negative for color change, rash and wound.  Neurological: Negative for syncope, light-headedness and headaches.  All other systems reviewed and are negative.   Per HPI unless specifically indicated above     Medication List       This list is accurate as of: 05/19/16  4:15 PM.  Always use your most recent med list.               amLODipine 10 MG tablet  Commonly known as:  NORVASC  Take 1 tablet (10 mg total) by mouth daily.     bicalutamide 50 MG tablet    Commonly known as:  CASODEX  Take 1 tablet (50 mg total) by mouth daily.     HYDROcodone-acetaminophen 5-325 MG tablet  Commonly known as:  NORCO/VICODIN  Take 1 tablet by mouth every 6 (six) hours as needed for moderate pain.     [name] IJ  Inject as directed every 3 (three) months.     Melatonin 5 MG Tabs  Take 5 mg by mouth.     MULTIVITAMIN GUMMIES ADULT Chew  Chew by mouth daily. Reported on 03/09/2016     potassium chloride SA 20 MEQ tablet  Commonly known as:  K-DUR,KLOR-CON  Take 1 tablet (20 mEq total) by mouth 2 (two) times daily.     predniSONE 20 MG tablet  Commonly known as:  DELTASONE  2 po at same time daily for 5 days           Objective:    BP 123/75 mmHg  Pulse 91  Temp(Src) 97.7 F (36.5 C) (Oral)  Ht 5\' 7"  (1.702 m)  Wt 199 lb 6.4 oz (90.447 kg)  BMI 31.22 kg/m2  Wt Readings from Last 3 Encounters:  05/19/16 199 lb 6.4 oz (90.447 kg)  05/05/16 197 lb (89.359 kg)  04/15/16 195 lb 12.8 oz (88.814 kg)    Physical Exam  Constitutional: He is oriented to person, place, and time. He appears well-developed  and well-nourished. No distress.  Eyes: Conjunctivae and EOM are normal. Pupils are equal, round, and reactive to light. Right eye exhibits no discharge. No scleral icterus.  Cardiovascular: Normal rate, regular rhythm, normal heart sounds and intact distal pulses.   No murmur heard. Pulmonary/Chest: Effort normal and breath sounds normal. No respiratory distress. He has no wheezes.  Musculoskeletal: He exhibits no edema.       Right hand: He exhibits decreased range of motion, tenderness and swelling. He exhibits normal capillary refill, no deformity and no laceration. Normal sensation noted. Normal strength noted.       Hands: Neurological: He is alert and oriented to person, place, and time. Coordination normal.  Skin: Skin is warm and dry. No rash noted. He is not diaphoretic.  Psychiatric: He has a normal mood and affect. His behavior is  normal.  Nursing note and vitals reviewed.     Assessment & Plan:       Problem List Items Addressed This Visit    None    Visit Diagnoses    Swollen joint    -  Primary    Swollen MCP joint of second third and fourth digit on right hand    Relevant Medications    predniSONE (DELTASONE) 20 MG tablet    Other Relevant Orders    Arthritis Panel        Follow up plan: Return if symptoms worsen or fail to improve.  Counseling provided for all of the vaccine components Orders Placed This Encounter  Procedures  . Arthritis Panel    Caryl Pina, MD [facility] 05/19/2016, 4:15 PM

## 2016-05-20 LAB — ARTHRITIS PANEL
BASOS: 0 %
Basophils Absolute: 0 10*3/uL (ref 0.0–0.2)
EOS (ABSOLUTE): 0.3 10*3/uL (ref 0.0–0.4)
EOS: 3 %
HEMOGLOBIN: 12.4 g/dL — AB (ref 12.6–17.7)
Hematocrit: 36.1 % — ABNORMAL LOW (ref 37.5–51.0)
IMMATURE GRANS (ABS): 0 10*3/uL (ref 0.0–0.1)
Immature Granulocytes: 0 %
LYMPHS: 25 %
Lymphocytes Absolute: 2.3 10*3/uL (ref 0.7–3.1)
MCH: 31 pg (ref 26.6–33.0)
MCHC: 34.3 g/dL (ref 31.5–35.7)
MCV: 90 fL (ref 79–97)
Monocytes Absolute: 0.7 10*3/uL (ref 0.1–0.9)
Monocytes: 8 %
NEUTROS ABS: 6 10*3/uL (ref 1.4–7.0)
Neutrophils: 64 %
Platelets: 369 10*3/uL (ref 150–379)
RBC: 4 x10E6/uL — AB (ref 4.14–5.80)
RDW: 13.3 % (ref 12.3–15.4)
Sed Rate: 3 mm/hr (ref 0–30)
URIC ACID: 4.9 mg/dL (ref 3.7–8.6)
WBC: 9.4 10*3/uL (ref 3.4–10.8)

## 2016-05-24 ENCOUNTER — Telehealth: Payer: Self-pay | Admitting: Family Medicine

## 2016-05-24 NOTE — Telephone Encounter (Signed)
Patient states he is still having pain in his hand/knuckles, the swelling has improved since taking the Prednisone, but said his pain is an 8 out of 10.  He wants to know if there is anything you can call in for the pain.  He is aware that you are out of the office until tomorrow.

## 2016-05-25 MED ORDER — MELOXICAM 15 MG PO TABS: 15.0000 mg | ORAL_TABLET | Freq: Every day | ORAL | Status: DC

## 2016-05-25 NOTE — Telephone Encounter (Signed)
Call in Meloxicam 15 mg once daily and give him 30 of them. Instructed to take it with food.

## 2016-05-25 NOTE — Addendum Note (Signed)
Addended by: Shelbie Ammons on: 05/25/2016 09:47 AM   Modules accepted: Orders

## 2016-05-25 NOTE — Telephone Encounter (Signed)
Meloxicam script sent to pharmacy ,patient aware.

## 2016-06-07 ENCOUNTER — Encounter (HOSPITAL_COMMUNITY): Payer: Medicaid Other | Attending: Hematology & Oncology

## 2016-06-07 DIAGNOSIS — C61 Malignant neoplasm of prostate: Secondary | ICD-10-CM | POA: Diagnosis present

## 2016-06-07 DIAGNOSIS — M8580 Other specified disorders of bone density and structure, unspecified site: Secondary | ICD-10-CM | POA: Diagnosis present

## 2016-06-07 DIAGNOSIS — R972 Elevated prostate specific antigen [PSA]: Secondary | ICD-10-CM

## 2016-06-07 LAB — COMPREHENSIVE METABOLIC PANEL
ALK PHOS: 66 U/L (ref 38–126)
ALT: 17 U/L (ref 17–63)
ANION GAP: 5 (ref 5–15)
AST: 17 U/L (ref 15–41)
Albumin: 4.1 g/dL (ref 3.5–5.0)
BILIRUBIN TOTAL: 0.6 mg/dL (ref 0.3–1.2)
BUN: 20 mg/dL (ref 6–20)
CALCIUM: 9 mg/dL (ref 8.9–10.3)
CO2: 27 mmol/L (ref 22–32)
Chloride: 103 mmol/L (ref 101–111)
Creatinine, Ser: 0.86 mg/dL (ref 0.61–1.24)
Glucose, Bld: 138 mg/dL — ABNORMAL HIGH (ref 65–99)
Potassium: 3.8 mmol/L (ref 3.5–5.1)
Sodium: 135 mmol/L (ref 135–145)
Total Protein: 7.2 g/dL (ref 6.5–8.1)

## 2016-06-07 LAB — PSA: PSA: 0.73 ng/mL (ref 0.00–4.00)

## 2016-06-08 LAB — VITAMIN D 25 HYDROXY (VIT D DEFICIENCY, FRACTURES): VIT D 25 HYDROXY: 25.6 ng/mL — AB (ref 30.0–100.0)

## 2016-06-10 ENCOUNTER — Encounter (HOSPITAL_COMMUNITY): Payer: Medicaid Other

## 2016-06-10 ENCOUNTER — Encounter (HOSPITAL_BASED_OUTPATIENT_CLINIC_OR_DEPARTMENT_OTHER): Payer: Medicaid Other | Admitting: Hematology & Oncology

## 2016-06-10 ENCOUNTER — Encounter (HOSPITAL_BASED_OUTPATIENT_CLINIC_OR_DEPARTMENT_OTHER): Payer: Medicaid Other

## 2016-06-10 ENCOUNTER — Encounter (HOSPITAL_COMMUNITY): Payer: Self-pay | Admitting: Hematology & Oncology

## 2016-06-10 VITALS — BP 128/88 | HR 79 | Temp 98.3°F | Resp 16 | Wt 196.0 lb

## 2016-06-10 DIAGNOSIS — R972 Elevated prostate specific antigen [PSA]: Secondary | ICD-10-CM

## 2016-06-10 DIAGNOSIS — Z5111 Encounter for antineoplastic chemotherapy: Secondary | ICD-10-CM

## 2016-06-10 DIAGNOSIS — M8580 Other specified disorders of bone density and structure, unspecified site: Secondary | ICD-10-CM | POA: Diagnosis not present

## 2016-06-10 DIAGNOSIS — C61 Malignant neoplasm of prostate: Secondary | ICD-10-CM | POA: Diagnosis present

## 2016-06-10 DIAGNOSIS — R634 Abnormal weight loss: Secondary | ICD-10-CM

## 2016-06-10 MED ORDER — ERGOCALCIFEROL 1.25 MG (50000 UT) PO CAPS: 50000.0000 [IU] | ORAL_CAPSULE | ORAL | Status: DC

## 2016-06-10 MED ORDER — LEUPROLIDE ACETATE (3 MONTH) 22.5 MG IM KIT
22.5000 mg | PACK | Freq: Once | INTRAMUSCULAR | Status: AC
  Administered 2016-06-10: 22.5 mg via INTRAMUSCULAR
  Filled 2016-06-10: qty 22.5

## 2016-06-10 NOTE — Patient Instructions (Signed)
[place] at [facility] Discharge Instructions  RECOMMENDATIONS MADE BY THE CONSULTANT AND ANY TEST RESULTS WILL BE SENT TO YOUR REFERRING PHYSICIAN.  Exam done and seen today by Dr. Maree Krabbe you lupron today as ordered Take vitamin D  As prescribed weekly Will do bone scan Return to see the doctor in Please call the clinic if you have any questions or concerns  Thank you for choosing [place] at [facility] to provide your oncology and hematology care.  To afford each patient quality time with our provider, please arrive at least 15 minutes before your scheduled appointment time.   Beginning January 23rd 2017 lab work for the [facility] will be done in the  Main lab at [facility] on 1st floor. If you have a lab appointment with the [facility] please come in thru the  Main Entrance and check in at the main information desk  You need to re-schedule your appointment should you arrive 10 or more minutes late.  We strive to give you quality time with our providers, and arriving late affects you and other patients whose appointments are after yours.  Also, if you no show three or more times for appointments you may be dismissed from the clinic at the providers discretion.     Again, thank you for choosing [facility].  Our hope is that these requests will decrease the amount of time that you wait before being seen by our physicians.       _____________________________________________________________  Should you have questions after your visit to [facility], please contact our office at [phone] between the hours of 8:30 a.m. and 4:30 p.m.  Voicemails left after 4:30 p.m. will not be returned until the following business day.  For prescription refill requests, have your pharmacy contact our office.         Resources For Cancer Patients and their Caregivers ? American Cancer Society: Can assist with  transportation, wigs, general needs, runs Look Good Feel Better.        775-382-4724 ? Cancer Care: Provides financial assistance, online support groups, medication/co-pay assistance.  1-800-813-HOPE 256-860-1620) ? [place] Assists [place] cancer patients and their families through emotional , educational and financial support.  337-498-4800 ? [place] DSS Where to apply for food stamps, Medicaid and utility assistance. (351) 248-0566 ? [facility]: Transportation to medical appointments. (419)888-1365 ? Social Security Administration: May apply for disability if have a Stage IV cancer. (989)434-6096 205 791 0013 ? [employer], Disability and Transit Services: Assists with nutrition, care and transit needs. [place] Support Programs: @10RELATIVEDAYS @ > Cancer Support Group  2nd Tuesday of the month 1pm-2pm, Journey Room  > Creative Journey  3rd Tuesday of the month 1130am-1pm, Journey Room  > Look Good Feel Better  1st Wednesday of the month 10am-12 noon, Journey Room (Call [place] to register [phone])

## 2016-06-10 NOTE — Progress Notes (Signed)
Please see doctors encounter for more information 

## 2016-06-10 NOTE — Progress Notes (Signed)
Worthy Rancher, MD [address] [place] [address]   DIAGNOSIS: Prostate cancer   Staging form: Prostate, AJCC 7th Edition     Clinical: Stage IIC (pT2c, N0, M0) - Signed by Baird Cancer, PA on 06/20/2011  CURRENT THERAPY: [name] and Casodex ([name] 08/15/2012)  Osteopenia on bone density on 12/2015 Last imaging studies CT chest abdomen and pelvis performed in 03/2016 negative.        INTERVAL HISTORY: Walter Boone 65 y.o. male returns for follow-up of adenocarcinoma of the prostate. The patient he was treated with definitive radiation therapy several years ago. He then developed a climb in his PSA. He was started on [name] in 2012. Casodex was added in October 2015 when his PSA climbed from 0.19 to 0.48 pg/ml. He has had problems with hot flashes and bilateral gynecomastia. He also has had problems with insomnia since starting therapy.   Mr. Marik is unaccompanied. I personally reviewed and went over laboratory studies with the patient, including  PSA levels.  He has been trying to lose weight. He feels fine though he gets tired a lot and takes more breaks than he used to. He is not sure if he is having trouble sleeping so he has been taking Benadryl which helps. "Some days I feel so weak, so I don't feel like doing nothing", though this is seldom. Further stating, "Most of the time I can do pretty well". He has a vegetable garden.   He had one cavity filled with silver. He is following regularly with his dentist and having cleanings done in July.   He received his handicap sticker which he notes helps a lot. Sometimes he uses the electronic carts while at the store due to fatigue.   He does not take calcium supplements. This has been discussed in the past.  No new pain. Appetite is unchanged.   MEDICAL HISTORY: Past Medical History  Diagnosis Date  . Prostate cancer ([place])   . Fatty liver   . Hypertension   . Hyperlipidemia   . Hemorrhoids       history  . Prostate cancer ([place]) 06/20/2011  . Prostate carcinoma, recurrent ([place]) 08/10/2012  . Gynecomastia, male 01/11/2013    Secondary to prostate ca Tx.   Marland Kitchen History of back injury 11/19/2013  . High cholesterol   . Hyperglycemia   . H/O Bell's palsy     has H N P-CERVICAL; CERVICAL SPASM; Prostate cancer ([place]); Gynecomastia, male; Hypertension; Hyperlipidemia; Osteopenia determined by x-ray; Prediabetes; and Knee pain, left on his problem list.     is allergic to tylenol.  Mr. Bacak had no medications administered during this visit.  SURGICAL HISTORY: Past Surgical History  Procedure Laterality Date  . Prostate biopsy  11/06  . Esophagogastroduodenoscopy    . Knee surgery      left  . Ankle surgery  1987    left  . Hernia repair  2010  . Colonoscopy  2008  . I&d extremity Left 06/10/2015    Procedure: IRRIGATION AND DEBRIDEMENT EXTREMITY LEFT HAND EXPLORATION, nerve repair;  Surgeon: Charlotte Crumb, MD;  Location: [facility];  Service: Orthopedics;  Laterality: Left;    SOCIAL HISTORY: Social History   Social History  . Marital Status: Married    Spouse Name: N/A  . Number of Children: N/A  . Years of Education: N/A   Occupational History  . Not on file.   Social History Main Topics  . Smoking  status: Former Smoker -- 2.50 packs/day for 3 years  . Smokeless tobacco: Never Used  . Alcohol Use: No     Comment: former drinker 20 years ago  . Drug Use: No  . Sexual Activity: Not on file   Other Topics Concern  . Not on file   Social History Narrative    FAMILY HISTORY: Family History  Problem Relation Age of Onset  . Diabetes Father   . Cancer Sister   . Diabetes Brother   . Heart failure Mother     Review of Systems  Constitutional: Fatigue HENT: Negative.   Eyes: Negative.   Respiratory: Negative. Cardiovascular: Negative.   Gastrointestinal: Negative.   Genitourinary: Negative.   Musculoskeletal: Positive for back pain and joint pain.   Skin: Negative.   Neurological: Negative for dizziness, tingling, tremors, sensory change, speech change, focal weakness, seizures and loss of consciousness.  Endo/Heme/Allergies: Negative.   Psychiatric/Behavioral: Negative.   14 point review of systems was performed and is negative except as detailed under history of present illness and above   PHYSICAL EXAMINATION  ECOG PERFORMANCE STATUS: 1 - Symptomatic but completely ambulatory  Filed Vitals:   06/10/16 1324  BP: 128/88  Pulse: 79  Temp: 98.3 F (36.8 C)  Resp: 16    Physical Exam  Constitutional: He is oriented to person, place, and time and well-developed, well-nourished, and in no distress.  Large lipoma on the back of his neck. Wears glasses HENT:  Head: Normocephalic and atraumatic.  Nose: Nose normal.  Mouth/Throat: Oropharynx is clear and moist. No oropharyngeal exudate.  Eyes: Conjunctivae and EOM are normal. Pupils are equal, round, and reactive to light. Right eye exhibits no discharge. Left eye exhibits no discharge. No scleral icterus.  Neck: Normal range of motion. Neck supple. No tracheal deviation present. No thyromegaly present.  Cardiovascular: Normal rate, regular rhythm and normal heart sounds.  Exam reveals no gallop and no friction rub.   No murmur heard. Pulmonary/Chest: Effort normal and breath sounds normal. He has no wheezes. He has no rales.  Abdominal: Soft. Bowel sounds are normal. He exhibits no distension and no mass. There is no tenderness. There is no rebound and no guarding.  Musculoskeletal: Normal range of motion. He exhibits no edema.  Lymphadenopathy:    He has no cervical adenopathy.  Neurological: He is alert and oriented to person, place, and time. He has normal reflexes. No cranial nerve deficit. Gait normal. Coordination normal.  Skin: Skin is warm and dry. No rash noted.  Psychiatric: Mood, memory, affect and judgment normal.  Nursing note and vitals reviewed.   LABORATORY  DATA: I have reviewed the data as listed.  CBC    Component Value Date/Time   WBC 9.4 05/19/2016 1623   WBC 7.8 12/09/2015 1032   RBC 4.00* 05/19/2016 1623   RBC 4.31 12/09/2015 1032   HGB 13.6 12/09/2015 1032   HCT 36.1* 05/19/2016 1623   HCT 40.0 12/09/2015 1032   PLT 369 05/19/2016 1623   PLT 388 12/09/2015 1032   MCV 90 05/19/2016 1623   MCV 92.8 12/09/2015 1032   MCH 31.0 05/19/2016 1623   MCH 31.6 12/09/2015 1032   MCHC 34.3 05/19/2016 1623   MCHC 34.0 12/09/2015 1032   RDW 13.3 05/19/2016 1623   RDW 12.8 12/09/2015 1032   LYMPHSABS 2.3 05/19/2016 1623   LYMPHSABS 2.6 12/09/2015 1032   MONOABS 0.4 12/09/2015 1032   EOSABS 0.3 05/19/2016 1623   EOSABS 0.3 12/09/2015 1032  BASOSABS 0.0 05/19/2016 1623   BASOSABS 0.1 12/09/2015 1032   CMP     Component Value Date/Time   NA 135 06/07/2016 1355   NA 140 11/11/2015 1547   K 3.8 06/07/2016 1355   CL 103 06/07/2016 1355   CO2 27 06/07/2016 1355   GLUCOSE 138* 06/07/2016 1355   GLUCOSE 151* 11/11/2015 1547   BUN 20 06/07/2016 1355   BUN 23 11/11/2015 1547   CREATININE 0.86 06/07/2016 1355   CALCIUM 9.0 06/07/2016 1355   PROT 7.2 06/07/2016 1355   PROT 7.1 11/11/2015 1547   ALBUMIN 4.1 06/07/2016 1355   ALBUMIN 4.3 11/11/2015 1547   AST 17 06/07/2016 1355   ALT 17 06/07/2016 1355   ALKPHOS 66 06/07/2016 1355   BILITOT 0.6 06/07/2016 1355   BILITOT <0.2 11/11/2015 1547   GFRNONAA >60 06/07/2016 1355   GFRAA >60 06/07/2016 1355   Results for HERNAN, DOELLING (MRN [id number]) as of 06/10/2016 13:26  Ref. Range 10/29/2014 08:34 01/12/2015 12:10 05/20/2015 09:49 06/10/2015 18:55 09/09/2015 09:46 12/09/2015 10:32 03/08/2016 10:33 06/07/2016 13:56  PSA Latest Ref Range: 0.00-4.00 ng/mL 0.25 0.40 0.29 0.50 0.40 0.37 0.55 0.73   Radiography: I have personally reviewed the radiological images as listed and agreed with the findings in the report. Study Result     CLINICAL DATA: Followup prostate carcinoma. Previous  radiation therapy. Rising PSA levels.  EXAM: CT CHEST, ABDOMEN, AND PELVIS WITH CONTRAST  TECHNIQUE: Multidetector CT imaging of the chest, abdomen and pelvis was performed following the standard protocol during bolus administration of intravenous contrast.  CONTRAST: 132mL ISOVUE-300 IOPAMIDOL (ISOVUE-300) INJECTION 61%  COMPARISON: Chest CT on 03/06/2013 and CAP CT on 07/23/2012  FINDINGS: CT CHEST FINDINGS  Mediastinum/Lymph Nodes: No masses, pathologically enlarged lymph nodes, or other significant abnormality.  Lungs/Pleura: No pulmonary mass, infiltrate, or effusion.  Musculoskeletal: No chest wall mass or suspicious bone lesions identified.  CT ABDOMEN PELVIS FINDINGS  Hepatobiliary: No masses or other significant abnormality. Gallbladder is unremarkable.  Pancreas: No mass, inflammatory changes, or other significant abnormality.  Spleen: Within normal limits in size and appearance.  Adrenals/Urinary Tract: No masses identified. No evidence of hydronephrosis. Stable tiny left upper pole renal cyst noted.  Stomach/Bowel: No evidence of obstruction, inflammatory process, or abnormal fluid collections. Diverticulosis is again seen involving the descending sigmoid colon, however there is no evidence of diverticulitis.  Vascular/Lymphatic: No pathologically enlarged lymph nodes. No evidence of abdominal aortic aneurysm.  Reproductive: Fiducial markers again seen in the region of the prostate bed. No masses identified.  Other: None.  Musculoskeletal: No suspicious bone lesions identified. Bilateral L5 pars defects again seen without associated spondylolisthesis. Mild lumbar degenerative disc disease again noted.  IMPRESSION: No evidence of metastatic disease within the chest, abdomen, or pelvis. No other acute findings identified.  Colonic diverticulosis. No radiographic evidence of diverticulitis.   Electronically Signed  By:  Earle Gell M.D.  On: 03/16/2016 16:19     ASSESSMENT and THERAPY PLAN:   Adenocarcinoma of prostate, biochemical recurrence [name] and casodex Hypokalemia Osteopenia  He is on [name] given every 3 months.  His PSA fluctuates and will need ongoing close observation. PSA is up and I have recommended restaging studies. His PSA has increased to 0.73. He has lost 20 pounds over the past year although he states he hast been trying. I will advise him of results of staging studies once available.   I have asked him to stop by the lab today for free/total testosterone. He is agreeable.  I again addressed his bone health. I have encouraged calcium and vitamin D. (1000 mg daily and at least 1000 to 2000 IU daily)  Vitamin D level will be check at follow-up.   He will return for a follow up in 3 months unless his scans are abnormal.  We will continue with ongoing [name]. If PSA continues to climb and bone scan is normal, will consider withdrawing casodex first.   Results for MONTERIOUS, DELISI (MRN [id number]) as of 06/10/2016 13:26  Ref. Range 10/29/2014 08:34 01/12/2015 12:10 05/20/2015 09:49 06/10/2015 18:55 09/09/2015 09:46 12/09/2015 10:32 03/08/2016 10:33 06/07/2016 13:56  PSA Latest Ref Range: 0.00-4.00 ng/mL 0.25 0.40 0.29 0.50 0.40 0.37 0.55 0.73   Orders Placed This Encounter  Procedures  . NM Bone Scan Whole Body    Standing Status:   Future    Number of Occurrences:   1    Standing Expiration Date:   06/10/2017    Order Specific Question:   Reason for Exam (SYMPTOM  OR DIAGNOSIS REQUIRED)    Answer:   prostate carcinoma, elevated PSA    Order Specific Question:   Preferred imaging location?    Answer:   [facility]    Order Specific Question:   If indicated for the ordered procedure, I authorize the administration of a radiopharmaceutical per Radiology protocol    Answer:   Yes  . Testosterone, free  . Testosterone  . SCHEDULING COMMUNICATION INJECTION    All questions  were answered. The patient knows to call the clinic with any problems, questions or concerns. We can certainly see the patient much sooner if necessary.  This document serves as a record of services personally performed by Ancil Linsey, MD. It was created on her behalf by Arlyce Harman, a trained medical scribe. The creation of this record is based on the scribe's personal observations and the provider's statements to them. This document has been checked and approved by the attending provider.  I have reviewed the above documentation for accuracy and completeness, and I agree with the above. Molli Hazard, MD

## 2016-06-11 LAB — TESTOSTERONE: Testosterone: <3 ng/dL — ABNORMAL LOW (ref 348–1197)

## 2016-06-12 LAB — TESTOSTERONE, FREE: Testosterone, Free: 2.1 pg/mL — ABNORMAL LOW (ref 6.6–18.1)

## 2016-06-17 ENCOUNTER — Encounter (HOSPITAL_COMMUNITY)
Admission: RE | Admit: 2016-06-17 | Discharge: 2016-06-17 | Disposition: A | Discharge status: Home / Self Care | Payer: Medicaid Other | Source: Ambulatory Visit | Attending: Hematology & Oncology | Admitting: Hematology & Oncology

## 2016-06-17 ENCOUNTER — Encounter (HOSPITAL_COMMUNITY): Payer: Self-pay

## 2016-06-17 DIAGNOSIS — C61 Malignant neoplasm of prostate: Secondary | ICD-10-CM | POA: Diagnosis present

## 2016-06-17 MED ORDER — TECHNETIUM TC 99M MEDRONATE IV KIT
25.0000 | PACK | Freq: Once | INTRAVENOUS | Status: AC | PRN
  Administered 2016-06-17: 25 via INTRAVENOUS

## 2016-06-20 ENCOUNTER — Telehealth (HOSPITAL_COMMUNITY): Payer: Self-pay | Admitting: *Deleted

## 2016-06-20 NOTE — Telephone Encounter (Signed)
Returned patient's phone call to let him know his test results.

## 2016-06-30 ENCOUNTER — Other Ambulatory Visit: Payer: Self-pay | Admitting: Family Medicine

## 2016-06-30 DIAGNOSIS — C61 Malignant neoplasm of prostate: Secondary | ICD-10-CM

## 2016-06-30 MED ORDER — HYDROCODONE-ACETAMINOPHEN 5-325 MG PO TABS: 1.0000 | ORAL_TABLET | Freq: Four times a day (QID) | ORAL | Status: DC | PRN

## 2016-06-30 NOTE — Telephone Encounter (Signed)
Last filled 03/31/16, last seen 05/19/16. No Contract, Dettingers pt

## 2016-06-30 NOTE — Telephone Encounter (Signed)
RX ready for pick up 

## 2016-06-30 NOTE — Telephone Encounter (Signed)
Pt aware rx is ready for pickup.  

## 2016-07-04 ENCOUNTER — Encounter (HOSPITAL_COMMUNITY): Payer: Self-pay | Admitting: Hematology & Oncology

## 2016-07-05 ENCOUNTER — Other Ambulatory Visit (HOSPITAL_COMMUNITY): Payer: Self-pay | Admitting: Emergency Medicine

## 2016-07-05 ENCOUNTER — Telehealth (HOSPITAL_COMMUNITY): Payer: Self-pay | Admitting: *Deleted

## 2016-07-05 DIAGNOSIS — M8580 Other specified disorders of bone density and structure, unspecified site: Secondary | ICD-10-CM

## 2016-07-05 NOTE — Progress Notes (Signed)
Pt notified of upcoming appt made for 7/31 for prolia

## 2016-07-11 ENCOUNTER — Encounter (HOSPITAL_COMMUNITY): Payer: Medicaid Other

## 2016-07-11 ENCOUNTER — Encounter (HOSPITAL_COMMUNITY): Payer: Self-pay

## 2016-07-11 ENCOUNTER — Encounter (HOSPITAL_COMMUNITY): Payer: Medicaid Other | Attending: Hematology & Oncology

## 2016-07-11 VITALS — BP 124/77 | HR 76 | Temp 98.0°F | Resp 16

## 2016-07-11 DIAGNOSIS — M8580 Other specified disorders of bone density and structure, unspecified site: Secondary | ICD-10-CM

## 2016-07-11 DIAGNOSIS — M859 Disorder of bone density and structure, unspecified: Secondary | ICD-10-CM

## 2016-07-11 DIAGNOSIS — C61 Malignant neoplasm of prostate: Secondary | ICD-10-CM | POA: Diagnosis not present

## 2016-07-11 LAB — COMPREHENSIVE METABOLIC PANEL
ALBUMIN: 4.5 g/dL (ref 3.5–5.0)
ALT: 22 U/L (ref 17–63)
ANION GAP: 7 (ref 5–15)
AST: 21 U/L (ref 15–41)
Alkaline Phosphatase: 63 U/L (ref 38–126)
BUN: 27 mg/dL — AB (ref 6–20)
CHLORIDE: 104 mmol/L (ref 101–111)
CO2: 26 mmol/L (ref 22–32)
Calcium: 9.3 mg/dL (ref 8.9–10.3)
Creatinine, Ser: 0.88 mg/dL (ref 0.61–1.24)
GFR calc Af Amer: >60 mL/min (ref >60)
Glucose, Bld: 104 mg/dL — ABNORMAL HIGH (ref 65–99)
POTASSIUM: 4.5 mmol/L (ref 3.5–5.1)
Sodium: 137 mmol/L (ref 135–145)
Total Bilirubin: 0.5 mg/dL (ref 0.3–1.2)
Total Protein: 7.6 g/dL (ref 6.5–8.1)

## 2016-07-11 MED ORDER — DENOSUMAB 60 MG/ML ~~LOC~~ SOLN
60.0000 mg | Freq: Once | SUBCUTANEOUS | Status: AC
  Administered 2016-07-11: 60 mg via SUBCUTANEOUS
  Filled 2016-07-11: qty 1

## 2016-07-11 NOTE — Progress Notes (Signed)
Walter Boone presents today for injection per the provider's orders.  Prolia administration without incident; see MAR for injection details.  Patient tolerated procedure well and without incident.  No questions or complaints noted at this time.

## 2016-08-01 ENCOUNTER — Other Ambulatory Visit: Payer: Self-pay | Admitting: Family Medicine

## 2016-08-01 NOTE — Telephone Encounter (Signed)
I will forward this to Dr. Warrick Parisian. This treats prostate cancer. Unsure if Urologists orders

## 2016-08-01 NOTE — Telephone Encounter (Signed)
Dettinger pt - he seen 05/2016.  [facility] coverage

## 2016-08-02 NOTE — Telephone Encounter (Signed)
We can go ahead and fill this once but then have him go ahead and see urology for further refills.

## 2016-08-02 NOTE — Telephone Encounter (Signed)
Patient is seeing oncology.

## 2016-08-03 ENCOUNTER — Telehealth (HOSPITAL_COMMUNITY): Payer: Self-pay

## 2016-08-03 ENCOUNTER — Telehealth: Payer: Self-pay | Admitting: Family Medicine

## 2016-08-03 NOTE — Telephone Encounter (Signed)
Called patient and instructed him to stop taking his casodex because his PSA level is rising. Explained to patient that he will continue [name] and his PSA will start going down off the casodex. He verbalized understanding.  Also called his PCP, Dr. Warrick Parisian, and left message with Michelene Heady explaining why patients casodex had not been refilled and that patient had been notified to stop taking the medication.

## 2016-08-04 NOTE — Telephone Encounter (Signed)
Attempted to call and left a message, if they call back please forward them to me

## 2016-08-04 NOTE — Telephone Encounter (Signed)
Dr. Warrick Parisian talked to pt himself about his recommendations

## 2016-08-08 ENCOUNTER — Ambulatory Visit: Payer: Medicaid Other | Admitting: Family Medicine

## 2016-08-11 ENCOUNTER — Other Ambulatory Visit (HOSPITAL_COMMUNITY): Payer: Self-pay

## 2016-08-11 ENCOUNTER — Telehealth (HOSPITAL_COMMUNITY): Payer: Self-pay

## 2016-08-11 NOTE — Telephone Encounter (Signed)
Received refill request from pharmacy for casodex. Earlier this month patient and PCP were instructed to not refill or take the casodex. Spoke with pharmacist at [facility] and told him the same thing. He verbalized understanding.

## 2016-08-12 ENCOUNTER — Encounter: Payer: Self-pay | Admitting: Family Medicine

## 2016-08-12 ENCOUNTER — Ambulatory Visit (INDEPENDENT_AMBULATORY_CARE_PROVIDER_SITE_OTHER): Payer: Medicaid Other | Admitting: Family Medicine

## 2016-08-12 VITALS — BP 127/85 | HR 78 | Temp 97.6°F | Ht 67.0 in | Wt 200.6 lb

## 2016-08-12 DIAGNOSIS — M2540 Effusion, unspecified joint: Secondary | ICD-10-CM

## 2016-08-12 NOTE — Progress Notes (Signed)
BP 127/85   Pulse 78   Temp 97.6 F (36.4 C) (Oral)   Ht 5\' 7"  (1.702 m)   Wt 200 lb 9.6 oz (91 kg)   BMI 31.42 kg/m    Subjective:    [name], male    DOB: February 26, 1951, 65 y.o.   MRN: [id number]  HPI: Walter Boone is a 65 y.o. male presenting on 08/12/2016 for Pain and swelling in right hand (Mobic & Prednisone have not helped)   HPI Joint swelling Patient has joint swelling in both the second and third MCP joints that's causing tenderness that's been going on for a couple months. He tried high-dose anti-inflammatories and then he tried a course of prednisone and did not get any relief from these. There is no erythema or warmth associated with it. He does have pain associated with range of motion of those joints but denies any pain or swelling in any joints anywhere else in his body. We were initially concerned for gallops or possible arthritis but those both should've gotten better with anti-inflammatories and prednisone. Patient does have a history of prostate cancer, so there is some concern that this may be related to that  Relevant past medical, surgical, family and social history reviewed and updated as indicated. Interim medical history since our last visit reviewed. Allergies and medications reviewed and updated.  Review of Systems  Constitutional: Negative for chills and fever.  Eyes: Negative for discharge.  Respiratory: Negative for shortness of breath and wheezing.   Cardiovascular: Negative for chest pain and leg swelling.  Musculoskeletal: Positive for arthralgias and joint swelling. Negative for back pain and gait problem.  Skin: Negative for rash.  All other systems reviewed and are negative.   Per HPI unless specifically indicated above     Medication List       Accurate as of 08/12/16 10:18 AM. Always use your most recent med list.          amLODipine 10 MG tablet Commonly known as:  NORVASC Take 1 tablet (10 mg total) by  mouth daily.   bicalutamide 50 MG tablet Commonly known as:  CASODEX TAKE 1 TABLET DAILY   ergocalciferol 50000 units capsule Commonly known as:  VITAMIN D2 Take 1 capsule (50,000 Units total) by mouth once a week.   HYDROcodone-acetaminophen 5-325 MG tablet Commonly known as:  NORCO/VICODIN Take 1 tablet by mouth every 6 (six) hours as needed for moderate pain.   [name] IJ Inject as directed every 3 (three) months.   Melatonin 5 MG Tabs Take 5 mg by mouth.   meloxicam 15 MG tablet Commonly known as:  MOBIC Take 1 tablet (15 mg total) by mouth daily.   MULTIVITAMIN GUMMIES ADULT Chew Chew by mouth daily. Reported on 03/09/2016   potassium chloride SA 20 MEQ tablet Commonly known as:  K-DUR,KLOR-CON Take 1 tablet (20 mEq total) by mouth 2 (two) times daily.   PROLIA 60 MG/ML Soln injection Generic drug:  denosumab Inject 60 mg into the skin every 6 (six) months. Administer in upper arm, thigh, or abdomen          Objective:    BP 127/85   Pulse 78   Temp 97.6 F (36.4 C) (Oral)   Ht 5\' 7"  (1.702 m)   Wt 200 lb 9.6 oz (91 kg)   BMI 31.42 kg/m   Wt Readings from Last 3 Encounters:  08/12/16 200 lb 9.6 oz (91 kg)  06/10/16 196 lb (88.9  kg)  05/19/16 199 lb 6.4 oz (90.4 kg)    Physical Exam  Constitutional: He is oriented to person, place, and time. He appears well-developed and well-nourished. No distress.  Eyes: Conjunctivae are normal. Right eye exhibits no discharge. No scleral icterus.  Cardiovascular: Normal rate, regular rhythm, normal heart sounds and intact distal pulses.   No murmur heard. Pulmonary/Chest: Effort normal and breath sounds normal. No respiratory distress. He has no wheezes.  Musculoskeletal: Normal range of motion. He exhibits no edema.       Right hand: He exhibits tenderness and swelling. He exhibits normal range of motion.  Pain and swelling over second and third MCP joints. No erythema or warmth. Increased pain with range of motion  and palpation.  Neurological: He is alert and oriented to person, place, and time. Coordination normal.  Skin: Skin is warm and dry. No rash noted. He is not diaphoretic.  Psychiatric: He has a normal mood and affect. His behavior is normal.  Nursing note and vitals reviewed.     Assessment & Plan:   Problem List Items Addressed This Visit    None    Visit Diagnoses    Swollen joint    -  Primary   Did not improve with steroids and anti-inflammatories, concern for other process, referral to Ortho and x-ray, but no insurance today, pt wants to wait   Relevant Orders   DG Hand Complete Right       Follow up plan: Return if symptoms worsen or fail to improve.  Counseling provided for all of the vaccine components Orders Placed This Encounter  Procedures  . DG Hand Complete Right    Caryl Pina, MD [facility] 08/12/2016, 10:18 AM

## 2016-08-25 ENCOUNTER — Ambulatory Visit: Payer: Self-pay | Admitting: Family Medicine

## 2016-08-26 ENCOUNTER — Encounter: Payer: Self-pay | Admitting: Family Medicine

## 2016-08-29 ENCOUNTER — Ambulatory Visit (INDEPENDENT_AMBULATORY_CARE_PROVIDER_SITE_OTHER): Payer: Medicaid Other | Admitting: Family Medicine

## 2016-08-29 ENCOUNTER — Encounter: Payer: Self-pay | Admitting: Family Medicine

## 2016-08-29 VITALS — BP 139/87 | HR 96 | Temp 98.0°F | Ht 67.0 in | Wt 202.1 lb

## 2016-08-29 DIAGNOSIS — M2540 Effusion, unspecified joint: Secondary | ICD-10-CM

## 2016-08-29 DIAGNOSIS — M25441 Effusion, right hand: Secondary | ICD-10-CM | POA: Diagnosis not present

## 2016-08-29 MED ORDER — PREDNISONE 20 MG PO TABS: ORAL_TABLET | ORAL | 0 refills | Status: DC

## 2016-08-29 MED ORDER — SULFAMETHOXAZOLE-TRIMETHOPRIM 800-160 MG PO TABS: 1.0000 | ORAL_TABLET | Freq: Two times a day (BID) | ORAL | 0 refills | Status: DC

## 2016-08-29 NOTE — Progress Notes (Signed)
BP 139/87   Pulse 96   Temp 98 F (36.7 C) (Oral)   Ht 5\' 7"  (1.702 m)   Wt 202 lb 2 oz (91.7 kg)   BMI 31.66 kg/m    Subjective:    [name], male    DOB: 22-Feb-1951, 64 y.o.   MRN: [id number]  HPI: Walter Boone is a 65 y.o. male presenting on 08/29/2016 for Swelling in knuckles of right hand   HPI Joint swelling Patient has joint swelling in both the second and third MCP joints that's causing tenderness that's been going on for a couple months. He tried high-dose anti-inflammatories and then he tried a course of prednisone and did not get any relief from these. There is no erythema or warmth associated with it. He does have pain associated with range of motion of those joints but denies any pain or swelling in any joints anywhere else in his body. We were initially concerned for possible arthritis but those both should've gotten better with anti-inflammatories and prednisone. Patient does have a history of prostate cancer, so there is some concern that this may be related to that.  Relevant past medical, surgical, family and social history reviewed and updated as indicated. Interim medical history since our last visit reviewed. Allergies and medications reviewed and updated.  Review of Systems  Constitutional: Negative for chills and fever.  Eyes: Negative for discharge.  Respiratory: Negative for shortness of breath and wheezing.   Cardiovascular: Negative for chest pain and leg swelling.  Musculoskeletal: Positive for arthralgias and joint swelling. Negative for back pain and gait problem.  Skin: Negative for rash.  All other systems reviewed and are negative.   Per HPI unless specifically indicated above     Medication List       Accurate as of 08/29/16  1:37 PM. Always use your most recent med list.          amLODipine 10 MG tablet Commonly known as:  NORVASC Take 1 tablet (10 mg total) by mouth daily.   ergocalciferol 50000 units  capsule Commonly known as:  VITAMIN D2 Take 1 capsule (50,000 Units total) by mouth once a week.   HYDROcodone-acetaminophen 5-325 MG tablet Commonly known as:  NORCO/VICODIN Take 1 tablet by mouth every 6 (six) hours as needed for moderate pain.   [name] IJ Inject as directed every 3 (three) months.   Melatonin 5 MG Tabs Take 5 mg by mouth.   predniSONE 20 MG tablet Commonly known as:  DELTASONE 2 po at same time daily for 5 days   PROLIA 60 MG/ML Soln injection Generic drug:  denosumab Inject 60 mg into the skin every 6 (six) months. Administer in upper arm, thigh, or abdomen   sulfamethoxazole-trimethoprim 800-160 MG tablet Commonly known as:  BACTRIM DS,SEPTRA DS Take 1 tablet by mouth 2 (two) times daily.         Objective:    BP 139/87   Pulse 96   Temp 98 F (36.7 C) (Oral)   Ht 5\' 7"  (1.702 m)   Wt 202 lb 2 oz (91.7 kg)   BMI 31.66 kg/m   Wt Readings from Last 3 Encounters:  08/29/16 202 lb 2 oz (91.7 kg)  08/12/16 200 lb 9.6 oz (91 kg)  06/10/16 196 lb (88.9 kg)    Physical Exam  Constitutional: He is oriented to person, place, and time. He appears well-developed and well-nourished. No distress.  Eyes: Conjunctivae are normal. Right eye exhibits  no discharge. No scleral icterus.  Cardiovascular: Normal rate, regular rhythm, normal heart sounds and intact distal pulses.   No murmur heard. Pulmonary/Chest: Effort normal and breath sounds normal. No respiratory distress. He has no wheezes.  Musculoskeletal: Normal range of motion. He exhibits no edema.       Right hand: He exhibits tenderness and swelling. He exhibits normal range of motion.  Pain and swelling over second and third MCP joints. No erythema or warmth. Increased pain with range of motion and palpation.  Neurological: He is alert and oriented to person, place, and time. Coordination normal.  Skin: Skin is warm and dry. No rash noted. He is not diaphoretic.  Psychiatric: He has a normal mood  and affect. His behavior is normal.  Nursing note and vitals reviewed.     Assessment & Plan:   Problem List Items Addressed This Visit    None    Visit Diagnoses    Swollen joint    -  Primary   No signs of redness or warmth but prednisone did not help initially, will retry prednisone with an antibiotic   Relevant Medications   predniSONE (DELTASONE) 20 MG tablet   sulfamethoxazole-trimethoprim (BACTRIM DS,SEPTRA DS) 800-160 MG tablet   Other Relevant Orders   Ambulatory referral to Orthopedic Surgery      Follow up plan: Return if symptoms worsen or fail to improve.  Counseling provided for all of the vaccine components Orders Placed This Encounter  Procedures  . Ambulatory referral to [place] Salathiel Ferrara, MD [facility] 08/29/2016, 1:37 PM

## 2016-09-08 ENCOUNTER — Encounter (HOSPITAL_COMMUNITY): Payer: Medicaid Other | Attending: Hematology & Oncology

## 2016-09-08 DIAGNOSIS — C61 Malignant neoplasm of prostate: Secondary | ICD-10-CM | POA: Diagnosis present

## 2016-09-08 DIAGNOSIS — M8580 Other specified disorders of bone density and structure, unspecified site: Secondary | ICD-10-CM | POA: Insufficient documentation

## 2016-09-08 LAB — COMPREHENSIVE METABOLIC PANEL
ALT: 24 U/L (ref 17–63)
ANION GAP: 5 (ref 5–15)
AST: 25 U/L (ref 15–41)
Albumin: 4.3 g/dL (ref 3.5–5.0)
Alkaline Phosphatase: 43 U/L (ref 38–126)
BUN: 21 mg/dL — ABNORMAL HIGH (ref 6–20)
CHLORIDE: 105 mmol/L (ref 101–111)
CO2: 25 mmol/L (ref 22–32)
Calcium: 9.2 mg/dL (ref 8.9–10.3)
Creatinine, Ser: 0.75 mg/dL (ref 0.61–1.24)
Glucose, Bld: 109 mg/dL — ABNORMAL HIGH (ref 65–99)
POTASSIUM: 4.1 mmol/L (ref 3.5–5.1)
Sodium: 135 mmol/L (ref 135–145)
Total Bilirubin: 0.3 mg/dL (ref 0.3–1.2)
Total Protein: 7.4 g/dL (ref 6.5–8.1)

## 2016-09-08 LAB — CBC WITH DIFFERENTIAL/PLATELET
Basophils Absolute: 0 10*3/uL (ref 0.0–0.1)
Basophils Relative: 1 %
EOS ABS: 0.3 10*3/uL (ref 0.0–0.7)
EOS PCT: 4 %
HCT: 39.4 % (ref 39.0–52.0)
Hemoglobin: 13.5 g/dL (ref 13.0–17.0)
LYMPHS ABS: 2.7 10*3/uL (ref 0.7–4.0)
LYMPHS PCT: 34 %
MCH: 31.5 pg (ref 26.0–34.0)
MCHC: 34.3 g/dL (ref 30.0–36.0)
MCV: 92.1 fL (ref 78.0–100.0)
MONO ABS: 0.6 10*3/uL (ref 0.1–1.0)
Monocytes Relative: 7 %
Neutro Abs: 4.3 10*3/uL (ref 1.7–7.7)
Neutrophils Relative %: 54 %
PLATELETS: 364 10*3/uL (ref 150–400)
RBC: 4.28 MIL/uL (ref 4.22–5.81)
RDW: 12.5 % (ref 11.5–15.5)
WBC: 7.9 10*3/uL (ref 4.0–10.5)

## 2016-09-08 LAB — PSA: PSA: 1.6 ng/mL (ref 0.00–4.00)

## 2016-09-09 NOTE — Progress Notes (Signed)
Worthy Rancher, MD [place] [address]   DIAGNOSIS: Prostate cancer   Staging form: Prostate, AJCC 7th Edition     Clinical: Stage IIC (pT2c, N0, M0) - Signed by Baird Cancer, PA on 06/20/2011  CURRENT THERAPY: [name] and Casodex ([name] 08/15/2012)  Osteopenia on bone density on 12/2015 Last imaging studies CT chest abdomen and pelvis performed in 03/2016 negative.        INTERVAL HISTORY: Walter Boone 65 y.o. male returns for follow-up of adenocarcinoma of the prostate. The patient he was treated with definitive radiation therapy several years ago. He then developed a climb in his PSA. He was started on [name] in 2012. Casodex was added in October 2015 when his PSA climbed from 0.19 to 0.48 pg/ml. He has had problems with hot flashes and bilateral gynecomastia. He also has had problems with insomnia since starting therapy.   Walter Boone is unaccompanied. I personally reviewed and went over laboratory studies with the patient, including increasing PSA.  He stopped taking Casodex about one month ago. States he still has some at home but he put them up somewhere so he wouldn't accidentally take them. He is concerned about whether he'll be able to find them. He will be unable to fill a new prescription due to Medicaid issues.  States he had problems with his Medicaid because he had already used up all of his deductibles. It turned out that the hospital bills were not being turned in. He was told this should be handled in about a week or two.   He reports issues with swollen knuckles on his right hand. He spoke with his PCP about seeing a hand specialist. This is when he found out he had Medicaid issues. This swelling has been there for the last 3 weeks and hurts day and night. He was given prednisone which helped but the swelling still persists. He is hardly able to grab anything and if he bumps it against anything it is very painful. He does not remember injuring  his hand to explain this swelling. He takes a hydrocodone which takes the pain away for 3 to 4 hours but it does not help with the swelling.  He reports feeling weak. When he sits down he falls asleep often. He believes this fatigue is secondary to nocturia. He takes melatonin which helps him fall asleep. He also reports hot flashes which interrupt his sleep.  He reports urinary difficulty in the mornings. He reports nocturia x 7. He has difficulty starting urination and reports intermittent urinary stream. He has not seen a urologist in a while. He was given Flomax by previous physician which helped.  He is not currently taking calcium or Vitamin D supplements. He has meant to pick more up but has not gotten his card back.    MEDICAL HISTORY: Past Medical History:  Diagnosis Date  . Fatty liver   . Gynecomastia, male 01/11/2013   Secondary to prostate ca Tx.   . H/O Bell's palsy   . Hemorrhoids    history  . High cholesterol   . History of back injury 11/19/2013  . Hyperglycemia   . Hyperlipidemia   . Hypertension   . Prostate cancer ([place])   . Prostate cancer ([place]) 06/20/2011  . Prostate carcinoma, recurrent ([place]) 08/10/2012    has H N P-CERVICAL; CERVICAL SPASM; Prostate cancer ([place]); Gynecomastia, male; Hypertension; Hyperlipidemia; Osteopenia determined by x-ray; Prediabetes; Knee pain, left; and Urinary frequency on  his problem list.     is allergic to tylenol [acetaminophen].  Walter Boone had no medications administered during this visit.  SURGICAL HISTORY: Past Surgical History:  Procedure Laterality Date  . [place]   left  . COLONOSCOPY  2008  . ESOPHAGOGASTRODUODENOSCOPY    . HERNIA REPAIR  2010  . I&D EXTREMITY Left 06/10/2015   Procedure: IRRIGATION AND DEBRIDEMENT EXTREMITY LEFT HAND EXPLORATION, nerve repair;  Surgeon: Charlotte Crumb, MD;  Location: [facility];  Service: Orthopedics;  Laterality: Left;  . KNEE SURGERY     left  . PROSTATE BIOPSY  11/06      SOCIAL HISTORY: Social History   Social History  . Marital status: Married    Spouse name: N/A  . Number of children: N/A  . Years of education: N/A   Occupational History  . Not on file.   Social History Main Topics  . Smoking status: Former Smoker    Packs/day: 2.50    Years: 3.00  . Smokeless tobacco: Never Used  . Alcohol use No     Comment: former drinker 20 years ago  . Drug use: No  . Sexual activity: Not on file   Other Topics Concern  . Not on file   Social History Narrative  . No narrative on file    FAMILY HISTORY: Family History  Problem Relation Age of Onset  . Heart failure Mother   . Diabetes Father   . Cancer Sister   . Diabetes Brother     Review of Systems  Constitutional: Positive for malaise/fatigue.       Fatigue secondary to nocturia. Hot flashes  HENT: Negative.   Eyes: Negative.   Respiratory: Negative.   Cardiovascular: Negative.   Gastrointestinal: Negative.   Genitourinary:       Nocturia x 7. Intermittent urinary stream. Difficulty beginning urination.  Musculoskeletal: Negative.        Painfully swollen knuckles on right hand  Skin: Negative.   Neurological: Negative.   Endo/Heme/Allergies: Negative.   Psychiatric/Behavioral: Negative.   All other systems reviewed and are negative. 14 point review of systems was performed and is negative except as detailed under history of present illness and above   PHYSICAL EXAMINATION  ECOG PERFORMANCE STATUS: 1 - Symptomatic but completely ambulatory  Vitals:   09/12/16 1252  BP: 128/76  Pulse: 71  Resp: 20  Temp: 98.2 F (36.8 C)    Physical Exam  Constitutional: He is oriented to person, place, and time and well-developed, well-nourished, and in no distress.  Large lipoma on the back of his neck. Wears glasses HENT:  Head: Normocephalic and atraumatic.  Nose: Nose normal.  Mouth/Throat: Oropharynx is clear and moist. No oropharyngeal exudate.  Eyes: Conjunctivae  and EOM are normal. Pupils are equal, round, and reactive to light. Right eye exhibits no discharge. Left eye exhibits no discharge. No scleral icterus.  Neck: Normal range of motion. Neck supple. No tracheal deviation present. No thyromegaly present.  Cardiovascular: Normal rate, regular rhythm and normal heart sounds.  Exam reveals no gallop and no friction rub.   No murmur heard. Pulmonary/Chest: Effort normal and breath sounds normal. He has no wheezes. He has no rales.  Abdominal: Soft. Bowel sounds are normal. He exhibits no distension and no mass. There is no tenderness. There is no rebound and no guarding.  Musculoskeletal: Normal range of motion. Two swollen knuckles on the right hand.  Lymphadenopathy:    He has no cervical adenopathy.  Neurological: He is alert and oriented to person, place, and time. He has normal reflexes. No cranial nerve deficit. Gait normal. Coordination normal.  Skin: Skin is warm and dry. No rash noted.  Psychiatric: Mood, memory, affect and judgment normal.  Nursing note and vitals reviewed.   LABORATORY DATA: I have reviewed the data as listed.  CBC    Component Value Date/Time   WBC 7.9 09/08/2016 1134   RBC 4.28 09/08/2016 1134   HGB 13.5 09/08/2016 1134   HCT 39.4 09/08/2016 1134   HCT 36.1 (L) 05/19/2016 1623   PLT 364 09/08/2016 1134   PLT 369 05/19/2016 1623   MCV 92.1 09/08/2016 1134   MCV 90 05/19/2016 1623   MCH 31.5 09/08/2016 1134   MCHC 34.3 09/08/2016 1134   RDW 12.5 09/08/2016 1134   RDW 13.3 05/19/2016 1623   LYMPHSABS 2.7 09/08/2016 1134   LYMPHSABS 2.3 05/19/2016 1623   MONOABS 0.6 09/08/2016 1134   EOSABS 0.3 09/08/2016 1134   EOSABS 0.3 05/19/2016 1623   BASOSABS 0.0 09/08/2016 1134   BASOSABS 0.0 05/19/2016 1623   CMP     Component Value Date/Time   NA 135 09/08/2016 1134   NA 140 11/11/2015 1547   K 4.1 09/08/2016 1134   CL 105 09/08/2016 1134   CO2 25 09/08/2016 1134   GLUCOSE 109 (H) 09/08/2016 1134   BUN  21 (H) 09/08/2016 1134   BUN 23 11/11/2015 1547   CREATININE 0.75 09/08/2016 1134   CALCIUM 9.2 09/08/2016 1134   PROT 7.4 09/08/2016 1134   PROT 7.1 11/11/2015 1547   ALBUMIN 4.3 09/08/2016 1134   ALBUMIN 4.3 11/11/2015 1547   AST 25 09/08/2016 1134   ALT 24 09/08/2016 1134   ALKPHOS 43 09/08/2016 1134   BILITOT 0.3 09/08/2016 1134   BILITOT <0.2 11/11/2015 1547   GFRNONAA >60 09/08/2016 1134   GFRAA >60 09/08/2016 1134   Results for KACYN, BASTIEN (MRN [id number]) as of 09/12/2016 13:19  Ref. Range 09/09/2015 09:46 12/09/2015 10:32 03/08/2016 10:33 06/07/2016 13:56 09/08/2016 11:34  PSA Latest Ref Range: 0.00 - 4.00 ng/mL 0.40 0.37 0.55 0.73 1.60   Results for RITHVIK, WALWORTH (MRN [id number]) as of 09/13/2016 21:26  Ref. Range 06/10/2016 14:35  Testosterone Latest Ref Range: 348 - 1,197 ng/dL <3 (L)   Radiography: I have personally reviewed the radiological images as listed and agreed with the findings in the report. Study Result   CLINICAL DATA:  Prostate cancer with elevated PSA.  EXAM: NUCLEAR MEDICINE WHOLE BODY BONE SCAN  TECHNIQUE: Whole body anterior and posterior images were obtained approximately 3 hours after intravenous injection of radiopharmaceutical.  RADIOPHARMACEUTICALS:  23.8 mCi Technetium-31m MDP IV  COMPARISON:  02/11/2015  FINDINGS: Normal distribution of tracer with visible kidneys and bladder. Negative for metastatic pattern. Increased activity around chronic bilateral L5 pars defects. Subtle increased activity left ischial tuberosity has no correlate on recent abdomen pelvis CT. Mildly heterogeneous calvarial activity is stable since 2011. Degenerative activity to the shoulders, hands, knees, and feet.  IMPRESSION: 1. Negative for metastatic pattern. 2. Increased degenerative activity at chronic L5 pars defects.   Electronically Signed   By: Monte Fantasia M.D.   On: 06/17/2016 12:50    ASSESSMENT and THERAPY PLAN:    Adenocarcinoma of prostate, biochemical recurrence [name] Hypokalemia Osteopenia  Prostate cancer [facility]) Patient treated with definitive XRT for adenocarcinoma of the prostate with biochemical failure in 2012, started on lupron at that time. Casodex adding in 2015 when PSA  noted to climb. PSA has recently been progressing and casodex was discontinued in June, he has unfortunately had a doubling of his PSA since. Testosterone checked at his last visit was "undetectable."  Bone scan in July was negative for metastatic disease. I have recommended CT imaging.  I suspect most likely failure within the prostate bed.   He can restart his casodex, although I think this was beginning to fail. If PSA continues to climb we may have to consider XTANDI or ZYTIGA.   Osteopenia determined by x-ray He will continue on prolia, calcium and vitamin D. Next bone density is due in 12/2017.   Gynecomastia, male Stable, secondary to current therapy  Urinary frequency Will consider flomax or referral to urology. Will discuss at follow-up post scans.   Laboratory studies were reviewed with the patient in detail and documented in the note.  He has a lapse in Medicaid coverage for the next two weeks.   He will return for follow up after staging studies to discuss these results.  Orders Placed This Encounter  Procedures  . CT Abdomen Pelvis W Contrast    Standing Status:   Future    Standing Expiration Date:   09/12/2017    Order Specific Question:   If indicated for the ordered procedure, I authorize the administration of contrast media per Radiology protocol    Answer:   Yes    Order Specific Question:   Reason for Exam (SYMPTOM  OR DIAGNOSIS REQUIRED)    Answer:   restaging prostate carcinoma, rising PSA    Order Specific Question:   Preferred imaging location?    Answer:   [facility]  . CT Chest W Contrast    Standing Status:   Future    Standing Expiration Date:   09/12/2017    Order Specific  Question:   If indicated for the ordered procedure, I authorize the administration of contrast media per Radiology protocol    Answer:   Yes    Order Specific Question:   Reason for Exam (SYMPTOM  OR DIAGNOSIS REQUIRED)    Answer:   restaging prostate carcinoma, rising PSA    Order Specific Question:   Preferred imaging location?    Answer:   [facility]  . PSA    Standing Status:   Future    Standing Expiration Date:   09/12/2017   All questions were answered. The patient knows to call the clinic with any problems, questions or concerns. We can certainly see the patient much sooner if necessary.  This document serves as a record of services personally performed by Ancil Linsey, MD. It was created on her behalf by Arlyce Harman, a trained medical scribe. The creation of this record is based on the scribe's personal observations and the provider's statements to them. This document has been checked and approved by the attending provider.  I have reviewed the above documentation for accuracy and completeness, and I agree with the above. Molli Hazard, MD

## 2016-09-12 ENCOUNTER — Encounter (HOSPITAL_BASED_OUTPATIENT_CLINIC_OR_DEPARTMENT_OTHER): Payer: Medicaid Other

## 2016-09-12 ENCOUNTER — Encounter (HOSPITAL_COMMUNITY): Payer: Medicaid Other | Attending: Hematology & Oncology | Admitting: Hematology & Oncology

## 2016-09-12 ENCOUNTER — Encounter (HOSPITAL_COMMUNITY): Payer: Self-pay | Admitting: Hematology & Oncology

## 2016-09-12 VITALS — BP 128/76 | HR 71 | Temp 98.2°F | Resp 20 | Wt 203.6 lb

## 2016-09-12 DIAGNOSIS — C61 Malignant neoplasm of prostate: Secondary | ICD-10-CM

## 2016-09-12 DIAGNOSIS — Z23 Encounter for immunization: Secondary | ICD-10-CM

## 2016-09-12 DIAGNOSIS — M8580 Other specified disorders of bone density and structure, unspecified site: Secondary | ICD-10-CM | POA: Diagnosis not present

## 2016-09-12 DIAGNOSIS — N62 Hypertrophy of breast: Secondary | ICD-10-CM

## 2016-09-12 DIAGNOSIS — Z5111 Encounter for antineoplastic chemotherapy: Secondary | ICD-10-CM

## 2016-09-12 DIAGNOSIS — R350 Frequency of micturition: Secondary | ICD-10-CM | POA: Diagnosis not present

## 2016-09-12 MED ORDER — INFLUENZA VAC SPLIT QUAD 0.5 ML IM SUSY
0.5000 mL | PREFILLED_SYRINGE | Freq: Once | INTRAMUSCULAR | Status: AC
  Administered 2016-09-12: 0.5 mL via INTRAMUSCULAR

## 2016-09-12 MED ORDER — LEUPROLIDE ACETATE (3 MONTH) 22.5 MG IM KIT
22.5000 mg | PACK | Freq: Once | INTRAMUSCULAR | Status: AC
  Administered 2016-09-12: 22.5 mg via INTRAMUSCULAR
  Filled 2016-09-12: qty 22.5

## 2016-09-12 MED ORDER — BICALUTAMIDE 50 MG PO TABS: 50.0000 mg | ORAL_TABLET | Freq: Every day | ORAL | 5 refills | Status: DC

## 2016-09-12 MED ORDER — INFLUENZA VAC SPLIT QUAD 0.5 ML IM SUSY
PREFILLED_SYRINGE | INTRAMUSCULAR | Status: AC
  Filled 2016-09-12: qty 0.5

## 2016-09-12 NOTE — Progress Notes (Signed)
Walter Boone tolerated [name] injection and Influenza vaccine well without complaints or incident. Pt discharged self ambulatory in satisfactory condition

## 2016-09-12 NOTE — Patient Instructions (Signed)
[place] at [facility] Discharge Instructions  RECOMMENDATIONS MADE BY THE CONSULTANT AND ANY TEST RESULTS WILL BE SENT TO YOUR REFERRING PHYSICIAN.  Received [name] injection today as well as Influenza vaccine. Follow-up as scheduled. Call clinic for any questions or concerns  Thank you for choosing [place] at [facility] to provide your oncology and hematology care.  To afford each patient quality time with our provider, please arrive at least 15 minutes before your scheduled appointment time.   Beginning January 23rd 2017 lab work for the [facility] will be done in the  Main lab at [facility] on 1st floor. If you have a lab appointment with the [facility] please come in thru the  Main Entrance and check in at the main information desk  You need to re-schedule your appointment should you arrive 10 or more minutes late.  We strive to give you quality time with our providers, and arriving late affects you and other patients whose appointments are after yours.  Also, if you no show three or more times for appointments you may be dismissed from the clinic at the providers discretion.     Again, thank you for choosing [facility].  Our hope is that these requests will decrease the amount of time that you wait before being seen by our physicians.       _____________________________________________________________  Should you have questions after your visit to [facility], please contact our office at [phone] between the hours of 8:30 a.m. and 4:30 p.m.  Voicemails left after 4:30 p.m. will not be returned until the following business day.  For prescription refill requests, have your pharmacy contact our office.         Resources For Cancer Patients and their Caregivers ? American Cancer Society: Can assist with transportation, wigs, general needs, runs Look Good Feel Better.         (240)687-1299 ? Cancer Care: Provides financial assistance, online support groups, medication/co-pay assistance.  1-800-813-HOPE (838)151-8928) ? [place] Assists [place] cancer patients and their families through emotional , educational and financial support.  321-751-8250 ? [place] DSS Where to apply for food stamps, Medicaid and utility assistance. (580)218-2334 ? RCATS: Transportation to medical appointments. (513)364-4106 ? Social Security Administration: May apply for disability if have a Stage IV cancer. 928-200-5851 747-151-5825 ? [employer], Disability and Transit Services: Assists with nutrition, care and transit needs. [place] Support Programs: @10RELATIVEDAYS @ > Cancer Support Group  2nd Tuesday of the month 1pm-2pm, Journey Room  > Creative Journey  3rd Tuesday of the month 1130am-1pm, Journey Room  > Look Good Feel Better  1st Wednesday of the month 10am-12 noon, Journey Room (Call [place] to register [phone])

## 2016-09-12 NOTE — Patient Instructions (Addendum)
[place] at [facility] Discharge Instructions  RECOMMENDATIONS MADE BY THE CONSULTANT AND ANY TEST RESULTS WILL BE SENT TO YOUR REFERRING PHYSICIAN.  You saw Dr. Whitney Muse today. Restart Casodex. Recheck PSA in 4 weeks. CT scans in 2 weeks. Follow up with MD after scans. Continue [name] and Prolia.  Thank you for choosing [place] at [facility] to provide your oncology and hematology care.  To afford each patient quality time with our provider, please arrive at least 15 minutes before your scheduled appointment time.   Beginning January 23rd 2017 lab work for the [facility] will be done in the  Main lab at [facility] on 1st floor. If you have a lab appointment with the [facility] please come in thru the  Main Entrance and check in at the main information desk  You need to re-schedule your appointment should you arrive 10 or more minutes late.  We strive to give you quality time with our providers, and arriving late affects you and other patients whose appointments are after yours.  Also, if you no show three or more times for appointments you may be dismissed from the clinic at the providers discretion.     Again, thank you for choosing [facility].  Our hope is that these requests will decrease the amount of time that you wait before being seen by our physicians.       _____________________________________________________________  Should you have questions after your visit to [facility], please contact our office at [phone] between the hours of 8:30 a.m. and 4:30 p.m.  Voicemails left after 4:30 p.m. will not be returned until the following business day.  For prescription refill requests, have your pharmacy contact our office.         Resources For Cancer Patients and their Caregivers ? American Cancer Society: Can assist with transportation, wigs, general needs, runs Look Good Feel Better.         (838)075-4998 ? Cancer Care: Provides financial assistance, online support groups, medication/co-pay assistance.  1-800-813-HOPE 562-129-9888) ? [place] Assists [place] cancer patients and their families through emotional , educational and financial support.  475-433-8032 ? [place] DSS Where to apply for food stamps, Medicaid and utility assistance. (314)625-1486 ? RCATS: Transportation to medical appointments. 626-698-7589 ? Social Security Administration: May apply for disability if have a Stage IV cancer. 570-471-2642 253-537-5856 ? [employer], Disability and Transit Services: Assists with nutrition, care and transit needs. [place] Support Programs: @10RELATIVEDAYS @ > Cancer Support Group  2nd Tuesday of the month 1pm-2pm, Journey Room  > Creative Journey  3rd Tuesday of the month 1130am-1pm, Journey Room  > Look Good Feel Better  1st Wednesday of the month 10am-12 noon, Journey Room (Call [place] to register [phone])

## 2016-09-13 DIAGNOSIS — R350 Frequency of micturition: Secondary | ICD-10-CM | POA: Insufficient documentation

## 2016-09-13 NOTE — Assessment & Plan Note (Signed)
Will consider flomax or referral to urology. Will discuss at follow-up post scans.

## 2016-09-13 NOTE — Assessment & Plan Note (Signed)
Stable, secondary to current therapy

## 2016-09-13 NOTE — Assessment & Plan Note (Signed)
Patient treated with definitive XRT for adenocarcinoma of the prostate with biochemical failure in 2012, started on lupron at that time. Casodex adding in 2015 when PSA noted to climb. PSA has recently been progressing and casodex was discontinued in June, he has unfortunately had a doubling of his PSA since. Testosterone checked at his last visit was "undetectable."  Bone scan in July was negative for metastatic disease. I have recommended CT imaging.  I suspect most likely failure within the prostate bed.   He can restart his casodex, although I think this was beginning to fail. If PSA continues to climb we may have to consider XTANDI or ZYTIGA.

## 2016-09-13 NOTE — Assessment & Plan Note (Signed)
He will continue on prolia, calcium and vitamin D. Next bone density is due in 12/2017.

## 2016-09-15 ENCOUNTER — Other Ambulatory Visit (HOSPITAL_COMMUNITY): Payer: Self-pay

## 2016-09-27 ENCOUNTER — Ambulatory Visit (HOSPITAL_COMMUNITY)
Admission: RE | Admit: 2016-09-27 | Discharge: 2016-09-27 | Disposition: A | Discharge status: Home / Self Care | Payer: Medicaid Other | Source: Ambulatory Visit | Attending: Hematology & Oncology | Admitting: Hematology & Oncology

## 2016-09-27 DIAGNOSIS — C61 Malignant neoplasm of prostate: Secondary | ICD-10-CM

## 2016-09-27 MED ORDER — IOPAMIDOL (ISOVUE-300) INJECTION 61%
INTRAVENOUS | Status: AC
  Filled 2016-09-27: qty 30

## 2016-09-27 MED ORDER — IOPAMIDOL (ISOVUE-300) INJECTION 61%
100.0000 mL | Freq: Once | INTRAVENOUS | Status: AC | PRN
  Administered 2016-09-27: 100 mL via INTRAVENOUS

## 2016-09-28 ENCOUNTER — Ambulatory Visit: Payer: Self-pay | Admitting: Family Medicine

## 2016-09-28 ENCOUNTER — Telehealth: Payer: Self-pay | Admitting: Family Medicine

## 2016-09-28 NOTE — Telephone Encounter (Signed)
Patient does not have a medicaid card.    He has a letter stating he was approved.  Advised to wait for card otherwise he would be a self pay patient.

## 2016-09-30 ENCOUNTER — Encounter (HOSPITAL_COMMUNITY): Payer: Self-pay | Admitting: Oncology

## 2016-09-30 ENCOUNTER — Encounter (HOSPITAL_BASED_OUTPATIENT_CLINIC_OR_DEPARTMENT_OTHER): Payer: Medicaid Other | Admitting: Oncology

## 2016-09-30 ENCOUNTER — Encounter (HOSPITAL_COMMUNITY): Payer: Medicaid Other

## 2016-09-30 VITALS — BP 153/84 | HR 73 | Temp 98.8°F | Resp 16 | Wt 209.7 lb

## 2016-09-30 DIAGNOSIS — C61 Malignant neoplasm of prostate: Secondary | ICD-10-CM | POA: Diagnosis not present

## 2016-09-30 DIAGNOSIS — M8580 Other specified disorders of bone density and structure, unspecified site: Secondary | ICD-10-CM

## 2016-09-30 DIAGNOSIS — M8588 Other specified disorders of bone density and structure, other site: Secondary | ICD-10-CM | POA: Diagnosis not present

## 2016-09-30 LAB — PSA: PSA: 1.41 ng/mL (ref 0.00–4.00)

## 2016-09-30 NOTE — Assessment & Plan Note (Addendum)
Patient treated with definitive XRT for adenocarcinoma of the prostate with biochemical failure in 2012, started on lupron at that time. Casodex added in 2015 when PSA noted to climb. PSA has recently been progressing and casodex was discontinued in June 2017 in an attempt to drive PSA back down.  HOWEVER, is PSA continued to climb and doubled over 4 months.  Therefore, Casodex was re-instituted on 09/12/2016 in order to re-challenge for a response. Testosterone levels are in castrated range.  Depo-[name] every 3 months is continued.  Oncology history updated.  Labs today: PSA.  I personally reviewed and went over laboratory results with the patient.  The results are noted within this dictation.  I personally reviewed and went over radiographic studies with the patient.  The results are noted within this dictation.  CT CAP was performed on 09/27/2016 and was negative for any acute findings and no disease noted within the chest/abdomen/pelvis.    Labs in 4 weeks: CBC diff, CMET, PSA.  If PSA response is note noted at that time, then a change in therapy to [facility] versus Zytiga+Prednisone may be needed.  Primary care provider is evaluating in working up his right distal metacarpal edematous/tender area.  Return in 4 weeks for follow-up.

## 2016-09-30 NOTE — Patient Instructions (Addendum)
[facility] at [facility] Discharge Instructions  RECOMMENDATIONS MADE BY THE CONSULTANT AND ANY TEST RESULTS WILL BE SENT TO YOUR REFERRING PHYSICIAN.  You saw Kirby Crigler, PA-C, today. Labs today. Follow up with Dr. Whitney Muse and lab work in 4 weeks.  Thank you for choosing [place] at [facility] to provide your oncology and hematology care.  To afford each patient quality time with our provider, please arrive at least 15 minutes before your scheduled appointment time.   Beginning January 23rd 2017 lab work for the [facility]one will be done in the  Main lab at [facility] on 1st floor. If you have a lab appointment with the [facility] please come in thru the  Main Entrance and check in at the main information desk  You need to re-schedule your appointment should you arrive 10 or more minutes late.  We strive to give you quality time with our providers, and arriving late affects you and other patients whose appointments are after yours.  Also, if you no show three or more times for appointments you may be dismissed from the clinic at the providers discretion.     Again, thank you for choosing [facility].  Our hope is that these requests will decrease the amount of time that you wait before being seen by our physicians.       _____________________________________________________________  Should you have questions after your visit to [facility], please contact our office at [phone] between the hours of 8:30 a.m. and 4:30 p.m.  Voicemails left after 4:30 p.m. will not be returned until the following business day.  For prescription refill requests, have your pharmacy contact our office.         Resources For Cancer Patients and their Caregivers ? American Cancer Society: Can assist with transportation, wigs, general needs, runs Look Good Feel Better.        986-725-2786 ? Cancer Care: Provides  financial assistance, online support groups, medication/co-pay assistance.  1-800-813-HOPE (204)001-8947) ? [place] Assists [place] cancer patients and their families through emotional , educational and financial support.  424-170-0269 ? [place] DSS Where to apply for food stamps, Medicaid and utility assistance. (650)244-4456 ? [facility]: Transportation to medical appointments. 249 745 1951 ? Social Security Administration: May apply for disability if have a Stage IV cancer. (306) 326-2519 505-440-4907 ? [employer], Disability and Transit Services: Assists with nutrition, care and transit needs. [place] Support Programs: @10RELATIVEDAYS @ > Cancer Support Group  2nd Tuesday of the month 1pm-2pm, Journey Room  > Creative Journey  3rd Tuesday of the month 1130am-1pm, Journey Room  > Look Good Feel Better  1st Wednesday of the month 10am-12 noon, Journey Room (Call [place] to register [phone])

## 2016-09-30 NOTE — Assessment & Plan Note (Signed)
He will continue on prolia, calcium and vitamin D. Next bone density is due in 12/2017.

## 2016-09-30 NOTE — Progress Notes (Signed)
Worthy Rancher, MD [place] [id number]  Prostate cancer [facility]) - Plan: CBC with Differential, Comprehensive metabolic panel, PSA  Osteopenia determined by x-ray  CURRENT THERAPY: Casodex restarted on 09/12/2016, Depo-[name] every 3 months, Prolia every 6 months  INTERVAL HISTORY: MANUELITO BEEGHLY 65 y.o. male returns for followup of Patient treated with definitive XRT for adenocarcinoma of the prostate with biochemical failure in 2012, started on lupron at that time. Casodex added in 2015 when PSA noted to climb. PSA has recently been progressing and casodex was discontinued in June 2017 in an attempt to drive PSA back down.  HOWEVER, is PSA continued to climb and doubled over 4 months.  Therefore, Casodex was re-instituted on 09/12/2016 in order to re-challenge for a response. Testosterone levels are in castrated range.  Depo-[name] every 3 months is continued.    Prostate cancer ([place])   06/20/2011 Initial Diagnosis    Prostate cancer ([place])      01/12/2016 Imaging    Bone density- This patient is considered osteopenic by World Healh Organization (WHO) Criteria.       03/16/2016 Imaging    CT CAP- No evidence of metastatic disease within the chest, abdomen, or pelvis. No other acute findings identified.      06/17/2016 Imaging    Bone scan- 1. Negative for metastatic pattern.      07/11/2016 Miscellaneous    Prolia every 6 months for osteopenia.      09/12/2016 -  Chemotherapy    Casodex/Depo-[name] every 3 months       09/27/2016 Imaging    CT CAP -No acute findings and no evidence for metastatic disease within the chest, abdomen or pelvis.      He is tolerating Casodex well. He denies any complaints associated with this restart medication. He notes that he started shortly after seeing Dr. Whitney Muse on 09/12/2016.  He denies any new pains that would be concerning for malignancy. He does have a right #2 distal metacarpal edematous area that is  tender without any known trauma.  He showed me some pictures of furniture he built. He is very active.  Review of Systems  Constitutional: Negative.  Negative for chills, fever and weight loss.  HENT: Negative.   Eyes: Negative.  Negative for blurred vision.  Respiratory: Negative.  Negative for cough.   Cardiovascular: Negative.  Negative for chest pain and palpitations.  Gastrointestinal: Negative.  Negative for constipation, diarrhea, nausea and vomiting.  Genitourinary: Negative.  Negative for dysuria.  Musculoskeletal: Negative.   Skin: Negative.   Neurological: Negative.  Negative for weakness.  Endo/Heme/Allergies: Negative.   Psychiatric/Behavioral: Negative.     Past Medical History:  Diagnosis Date  . Fatty liver   . Gynecomastia, male 01/11/2013   Secondary to prostate ca Tx.   . H/O Bell's palsy   . Hemorrhoids    history  . High cholesterol   . History of back injury 11/19/2013  . Hyperglycemia   . Hyperlipidemia   . Hypertension   . Prostate cancer ([place])   . Prostate cancer ([place]) 06/20/2011  . Prostate carcinoma, recurrent ([place]) 08/10/2012    Past Surgical History:  Procedure Laterality Date  . [place]   left  . COLONOSCOPY  2008  . ESOPHAGOGASTRODUODENOSCOPY    . HERNIA REPAIR  2010  . I&D EXTREMITY Left 06/10/2015   Procedure: IRRIGATION AND DEBRIDEMENT EXTREMITY LEFT HAND EXPLORATION, nerve repair;  Surgeon: Charlotte Crumb, MD;  Location: [facility];  Service: Orthopedics;  Laterality: Left;  . KNEE SURGERY     left  . PROSTATE BIOPSY  11/06    Family History  Problem Relation Age of Onset  . Heart failure Mother   . Diabetes Father   . Cancer Sister   . Diabetes Brother     Social History   Social History  . Marital status: Married    Spouse name: N/A  . Number of children: N/A  . Years of education: N/A   Social History Main Topics  . Smoking status: Former Smoker    Packs/day: 2.50    Years: 3.00  . Smokeless tobacco: Never  Used  . Alcohol use No     Comment: former drinker 20 years ago  . Drug use: No  . Sexual activity: Not Asked   Other Topics Concern  . None   Social History Narrative  . None     PHYSICAL EXAMINATION  ECOG PERFORMANCE STATUS: 0 - Asymptomatic  Vitals:   09/30/16 1208  BP: (!) 153/84  Pulse: 73  Resp: 16  Temp: 98.8 F (37.1 C)    GENERAL:alert, no distress, well nourished, well developed, comfortable, cooperative, smiling and unaccompanied SKIN: skin color, texture, turgor are normal, no rashes or significant lesions HEAD: Normocephalic, No masses, lesions, tenderness or abnormalities EYES: normal, EOMI, Conjunctiva are pink and non-injected EARS: External ears normal OROPHARYNX:lips, buccal mucosa, and tongue normal and mucous membranes are moist  NECK: supple, no adenopathy, trachea midline LYMPH:  no palpable lymphadenopathy BREAST:not examined LUNGS: clear to auscultation and percussion HEART: regular rate & rhythm ABDOMEN:abdomen soft and normal bowel sounds BACK: Back symmetric, no curvature. EXTREMITIES:less then 2 second capillary refill, no joint deformities, effusion, or inflammation, no skin discoloration, no cyanosis, positive findings:  Right #2 distal metacarpal edematous area that is tender to palpation with decreased ROM.  NEURO: alert & oriented x 3 with fluent speech, no focal motor/sensory deficits, gait normal   LABORATORY DATA: CBC    Component Value Date/Time   WBC 7.9 09/08/2016 1134   RBC 4.28 09/08/2016 1134   HGB 13.5 09/08/2016 1134   HCT 39.4 09/08/2016 1134   HCT 36.1 (L) 05/19/2016 1623   PLT 364 09/08/2016 1134   PLT 369 05/19/2016 1623   MCV 92.1 09/08/2016 1134   MCV 90 05/19/2016 1623   MCH 31.5 09/08/2016 1134   MCHC 34.3 09/08/2016 1134   RDW 12.5 09/08/2016 1134   RDW 13.3 05/19/2016 1623   LYMPHSABS 2.7 09/08/2016 1134   LYMPHSABS 2.3 05/19/2016 1623   MONOABS 0.6 09/08/2016 1134   EOSABS 0.3 09/08/2016 1134    EOSABS 0.3 05/19/2016 1623   BASOSABS 0.0 09/08/2016 1134   BASOSABS 0.0 05/19/2016 1623      Chemistry      Component Value Date/Time   NA 135 09/08/2016 1134   NA 140 11/11/2015 1547   K 4.1 09/08/2016 1134   CL 105 09/08/2016 1134   CO2 25 09/08/2016 1134   BUN 21 (H) 09/08/2016 1134   BUN 23 11/11/2015 1547   CREATININE 0.75 09/08/2016 1134   GLU 97 04/22/2016      Component Value Date/Time   CALCIUM 9.2 09/08/2016 1134   ALKPHOS 43 09/08/2016 1134   AST 25 09/08/2016 1134   ALT 24 09/08/2016 1134   BILITOT 0.3 09/08/2016 1134   BILITOT <0.2 11/11/2015 1547        PENDING LABS:   RADIOGRAPHIC STUDIES:  Ct Chest W  Contrast  Result Date: 09/27/2016 CLINICAL DATA:  Followup prostate cancer EXAM: CT CHEST, ABDOMEN, AND PELVIS WITH CONTRAST TECHNIQUE: Multidetector CT imaging of the chest, abdomen and pelvis was performed following the standard protocol during bolus administration of intravenous contrast. CONTRAST:  124mL ISOVUE-300 IOPAMIDOL (ISOVUE-300) INJECTION 61% COMPARISON:  03/16/2016 FINDINGS: CT CHEST FINDINGS Cardiovascular: No significant vascular findings. Normal heart size. No pericardial effusion. Mediastinum/Nodes: No enlarged mediastinal, hilar, or axillary lymph nodes. Thyroid gland, trachea, and esophagus demonstrate no significant findings. Lungs/Pleura: Lungs are clear. No pleural effusion or pneumothorax. Musculoskeletal: No chest wall mass or suspicious bone lesions identified. CT ABDOMEN PELVIS FINDINGS Hepatobiliary: No focal liver abnormality is seen. No gallstones, gallbladder wall thickening, or biliary dilatation. Pancreas: Unremarkable. No pancreatic ductal dilatation or surrounding inflammatory changes. Spleen: Normal in size without focal abnormality. Adrenals/Urinary Tract: Normal adrenal glands. Small cyst arising from the posterior left kidney measures 1.5 cm and is unchanged from previous exam. No mass or hydronephrosis. The urinary bladder  appears normal. Patent ureters. Stomach/Bowel: Stomach is within normal limits. Appendix appears normal. No evidence of bowel wall thickening, distention, or inflammatory changes. Vascular/Lymphatic: Calcified atherosclerotic disease involves the abdominal aorta. No aneurysm. No enlarged retroperitoneal or mesenteric adenopathy. No enlarged pelvic or inguinal lymph nodes. Reproductive: Fiducial markers noted within the prostate bed. No mass identified. Other: There is no ascites or focal fluid collections within the abdomen or pelvis. Musculoskeletal: No aggressive lytic or sclerotic bone lesions. Degenerative disc disease noted within the lumbar spine. IMPRESSION: 1. No acute findings and no evidence for metastatic disease within the chest, abdomen or pelvis. Electronically Signed   By: Kerby Moors M.D.   On: 09/27/2016 16:22   Ct Abdomen Pelvis W Contrast  Result Date: 09/27/2016 CLINICAL DATA:  Followup prostate cancer EXAM: CT CHEST, ABDOMEN, AND PELVIS WITH CONTRAST TECHNIQUE: Multidetector CT imaging of the chest, abdomen and pelvis was performed following the standard protocol during bolus administration of intravenous contrast. CONTRAST:  162mL ISOVUE-300 IOPAMIDOL (ISOVUE-300) INJECTION 61% COMPARISON:  03/16/2016 FINDINGS: CT CHEST FINDINGS Cardiovascular: No significant vascular findings. Normal heart size. No pericardial effusion. Mediastinum/Nodes: No enlarged mediastinal, hilar, or axillary lymph nodes. Thyroid gland, trachea, and esophagus demonstrate no significant findings. Lungs/Pleura: Lungs are clear. No pleural effusion or pneumothorax. Musculoskeletal: No chest wall mass or suspicious bone lesions identified. CT ABDOMEN PELVIS FINDINGS Hepatobiliary: No focal liver abnormality is seen. No gallstones, gallbladder wall thickening, or biliary dilatation. Pancreas: Unremarkable. No pancreatic ductal dilatation or surrounding inflammatory changes. Spleen: Normal in size without focal  abnormality. Adrenals/Urinary Tract: Normal adrenal glands. Small cyst arising from the posterior left kidney measures 1.5 cm and is unchanged from previous exam. No mass or hydronephrosis. The urinary bladder appears normal. Patent ureters. Stomach/Bowel: Stomach is within normal limits. Appendix appears normal. No evidence of bowel wall thickening, distention, or inflammatory changes. Vascular/Lymphatic: Calcified atherosclerotic disease involves the abdominal aorta. No aneurysm. No enlarged retroperitoneal or mesenteric adenopathy. No enlarged pelvic or inguinal lymph nodes. Reproductive: Fiducial markers noted within the prostate bed. No mass identified. Other: There is no ascites or focal fluid collections within the abdomen or pelvis. Musculoskeletal: No aggressive lytic or sclerotic bone lesions. Degenerative disc disease noted within the lumbar spine. IMPRESSION: 1. No acute findings and no evidence for metastatic disease within the chest, abdomen or pelvis. Electronically Signed   By: Kerby Moors M.D.   On: 09/27/2016 16:22     PATHOLOGY:    ASSESSMENT AND PLAN:  Prostate cancer [facility]) Patient treated with  definitive XRT for adenocarcinoma of the prostate with biochemical failure in 2012, started on lupron at that time. Casodex added in 2015 when PSA noted to climb. PSA has recently been progressing and casodex was discontinued in June 2017 in an attempt to drive PSA back down.  HOWEVER, is PSA continued to climb and doubled over 4 months.  Therefore, Casodex was re-instituted on 09/12/2016 in order to re-challenge for a response. Testosterone levels are in castrated range.  Depo-[name] every 3 months is continued.  Oncology history updated.  Labs today: PSA.  I personally reviewed and went over laboratory results with the patient.  The results are noted within this dictation.  I personally reviewed and went over radiographic studies with the patient.  The results are noted within this  dictation.  CT CAP was performed on 09/27/2016 and was negative for any acute findings and no disease noted within the chest/abdomen/pelvis.    Labs in 4 weeks: CBC diff, CMET, PSA.  If PSA response is note noted at that time, then a change in therapy to [facility] versus Zytiga+Prednisone may be needed.  Primary care provider is evaluating in working up his right distal metacarpal edematous/tender area.  Return in 4 weeks for follow-up.  Osteopenia determined by x-ray He will continue on prolia, calcium and vitamin D. Next bone density is due in 12/2017.    ORDERS PLACED FOR THIS ENCOUNTER: Orders Placed This Encounter  Procedures  . CBC with Differential  . Comprehensive metabolic panel  . PSA    MEDICATIONS PRESCRIBED THIS ENCOUNTER: No orders of the defined types were placed in this encounter.   THERAPY PLAN:  Continue Casodex/Depo-[name]/Prolia.  If PSA dose not respond, then change in therapy will be indicated.  All questions were answered. The patient knows to call the clinic with any problems, questions or concerns. We can certainly see the patient much sooner if necessary.  Patient and plan discussed with Dr. Ancil Linsey and she is in agreement with the aforementioned.   This note is electronically signed by: Doy Mince 09/30/2016 12:40 PM

## 2016-10-05 ENCOUNTER — Other Ambulatory Visit (HOSPITAL_COMMUNITY): Payer: Self-pay | Admitting: *Deleted

## 2016-10-05 DIAGNOSIS — C61 Malignant neoplasm of prostate: Secondary | ICD-10-CM

## 2016-10-10 ENCOUNTER — Encounter (HOSPITAL_COMMUNITY): Payer: Medicaid Other

## 2016-10-10 DIAGNOSIS — M8580 Other specified disorders of bone density and structure, unspecified site: Secondary | ICD-10-CM | POA: Diagnosis not present

## 2016-10-10 DIAGNOSIS — C61 Malignant neoplasm of prostate: Secondary | ICD-10-CM

## 2016-10-10 LAB — COMPREHENSIVE METABOLIC PANEL
ALBUMIN: 4.3 g/dL (ref 3.5–5.0)
ALK PHOS: 45 U/L (ref 38–126)
ALT: 19 U/L (ref 17–63)
AST: 17 U/L (ref 15–41)
Anion gap: 8 (ref 5–15)
BILIRUBIN TOTAL: 0.5 mg/dL (ref 0.3–1.2)
BUN: 19 mg/dL (ref 6–20)
CALCIUM: 8.9 mg/dL (ref 8.9–10.3)
CO2: 24 mmol/L (ref 22–32)
CREATININE: 0.98 mg/dL (ref 0.61–1.24)
Chloride: 104 mmol/L (ref 101–111)
GFR calc non Af Amer: >60 mL/min (ref >60)
GLUCOSE: 97 mg/dL (ref 65–99)
Potassium: 3.8 mmol/L (ref 3.5–5.1)
Sodium: 136 mmol/L (ref 135–145)
TOTAL PROTEIN: 7.4 g/dL (ref 6.5–8.1)

## 2016-10-10 LAB — CBC
HCT: 39.6 % (ref 39.0–52.0)
Hemoglobin: 13.8 g/dL (ref 13.0–17.0)
MCH: 31.8 pg (ref 26.0–34.0)
MCHC: 34.8 g/dL (ref 30.0–36.0)
MCV: 91.2 fL (ref 78.0–100.0)
Platelets: 365 10*3/uL (ref 150–400)
RBC: 4.34 MIL/uL (ref 4.22–5.81)
RDW: 12.6 % (ref 11.5–15.5)
WBC: 8.3 10*3/uL (ref 4.0–10.5)

## 2016-10-10 LAB — PSA: PSA: 1.3 ng/mL (ref 0.00–4.00)

## 2016-10-12 ENCOUNTER — Telehealth: Payer: Self-pay | Admitting: Family Medicine

## 2016-10-13 ENCOUNTER — Telehealth: Payer: Self-pay | Admitting: Family Medicine

## 2016-10-13 NOTE — Telephone Encounter (Signed)
Pt aware x-rays were not performed at our facility

## 2016-10-27 ENCOUNTER — Encounter (HOSPITAL_COMMUNITY): Payer: Medicaid Other | Attending: Hematology & Oncology

## 2016-10-27 DIAGNOSIS — C61 Malignant neoplasm of prostate: Secondary | ICD-10-CM

## 2016-10-27 DIAGNOSIS — M8580 Other specified disorders of bone density and structure, unspecified site: Secondary | ICD-10-CM | POA: Insufficient documentation

## 2016-10-27 LAB — CBC WITH DIFFERENTIAL/PLATELET
BASOS PCT: 1 %
Basophils Absolute: 0.1 10*3/uL (ref 0.0–0.1)
EOS ABS: 0.4 10*3/uL (ref 0.0–0.7)
EOS PCT: 4 %
HCT: 39.7 % (ref 39.0–52.0)
Hemoglobin: 13.7 g/dL (ref 13.0–17.0)
LYMPHS ABS: 2.9 10*3/uL (ref 0.7–4.0)
Lymphocytes Relative: 31 %
MCH: 31.9 pg (ref 26.0–34.0)
MCHC: 34.5 g/dL (ref 30.0–36.0)
MCV: 92.3 fL (ref 78.0–100.0)
Monocytes Absolute: 0.6 10*3/uL (ref 0.1–1.0)
Monocytes Relative: 7 %
Neutro Abs: 5.4 10*3/uL (ref 1.7–7.7)
Neutrophils Relative %: 57 %
PLATELETS: 333 10*3/uL (ref 150–400)
RBC: 4.3 MIL/uL (ref 4.22–5.81)
RDW: 12.6 % (ref 11.5–15.5)
WBC: 9.3 10*3/uL (ref 4.0–10.5)

## 2016-10-27 LAB — COMPREHENSIVE METABOLIC PANEL
ALT: 20 U/L (ref 17–63)
ANION GAP: 7 (ref 5–15)
AST: 22 U/L (ref 15–41)
Albumin: 4.2 g/dL (ref 3.5–5.0)
Alkaline Phosphatase: 40 U/L (ref 38–126)
BUN: 17 mg/dL (ref 6–20)
CHLORIDE: 103 mmol/L (ref 101–111)
CO2: 25 mmol/L (ref 22–32)
Calcium: 9.1 mg/dL (ref 8.9–10.3)
Creatinine, Ser: 0.85 mg/dL (ref 0.61–1.24)
GFR calc non Af Amer: >60 mL/min (ref >60)
Glucose, Bld: 106 mg/dL — ABNORMAL HIGH (ref 65–99)
Potassium: 4 mmol/L (ref 3.5–5.1)
SODIUM: 135 mmol/L (ref 135–145)
Total Bilirubin: 0.5 mg/dL (ref 0.3–1.2)
Total Protein: 7.2 g/dL (ref 6.5–8.1)

## 2016-10-27 LAB — PSA: PSA: 1.39 ng/mL (ref 0.00–4.00)

## 2016-10-31 ENCOUNTER — Encounter (HOSPITAL_COMMUNITY): Payer: Medicaid Other | Attending: Hematology & Oncology | Admitting: Hematology & Oncology

## 2016-10-31 ENCOUNTER — Encounter (HOSPITAL_COMMUNITY): Payer: Self-pay | Admitting: Hematology & Oncology

## 2016-10-31 VITALS — BP 127/95 | HR 88 | Temp 97.9°F | Resp 18 | Wt 210.0 lb

## 2016-10-31 DIAGNOSIS — M8580 Other specified disorders of bone density and structure, unspecified site: Secondary | ICD-10-CM

## 2016-10-31 DIAGNOSIS — C61 Malignant neoplasm of prostate: Secondary | ICD-10-CM | POA: Diagnosis present

## 2016-10-31 DIAGNOSIS — R972 Elevated prostate specific antigen [PSA]: Secondary | ICD-10-CM | POA: Diagnosis not present

## 2016-10-31 NOTE — Progress Notes (Signed)
Walter Rancher, MD [place] [address]   DIAGNOSIS: Prostate cancer   Staging form: Prostate, AJCC 7th Edition     Clinical: Stage IIC (pT2c, N0, M0) - Signed by Baird Cancer, PA on 06/20/2011  CURRENT THERAPY: [name] and Casodex ([name] 08/15/2012)  Osteopenia on bone density on 12/2015 Last imaging studies CT chest abdomen and pelvis performed in 09/2016 negative.   Bone Scan 06/2016 negative for metastatic disease    Prostate cancer ([place])   06/20/2011 Initial Diagnosis    Prostate cancer ([place])      01/12/2016 Imaging    Bone density- This patient is considered osteopenic by World Healh Organization (WHO) Criteria.       03/16/2016 Imaging    CT CAP- No evidence of metastatic disease within the chest, abdomen, or pelvis. No other acute findings identified.      06/17/2016 Imaging    Bone scan- 1. Negative for metastatic pattern.      07/11/2016 Miscellaneous    Prolia every 6 months for osteopenia.      09/12/2016 -  Chemotherapy    Casodex/Depo-[name] every 3 months       09/27/2016 Imaging    CT CAP -No acute findings and no evidence for metastatic disease within the chest, abdomen or pelvis.           INTERVAL HISTORY: Walter Boone 65 y.o. male returns for follow-up of adenocarcinoma of the prostate. The patient was treated with definitive radiation therapy several years ago. He then developed a climb in his PSA. He was started on [name] in 2012. Casodex was added in October 2015 when his PSA climbed from 0.19 to 0.48 pg/ml. He has had problems with hot flashes and bilateral gynecomastia. He also has had problems with insomnia since starting therapy.   Mr. Stoneking is unaccompanied. He feels good today. I have reviewed the labs and scans with the patient.   He is back on his Casodex. Last CT imaging was in October 2017. No evidence of metastatic disease was noted. Last bone scan in July.   He says he feels sometimes that when he  eats, the food gets stuck in his throat. He notes that it is an uncomfortable feeling. He says it doesn't feel like acid reflux. He has never had an EGD.   He is getting his vaccination for whooping cough since his daughter is having a baby. He wants to know if getting that shot would effect anything.  He has received his flu shot.   He denies abdominal pain.   He has been staying active, cooking and building furniture.   MEDICAL HISTORY: Past Medical History:  Diagnosis Date  . Fatty liver   . Gynecomastia, male 01/11/2013   Secondary to prostate ca Tx.   . H/O Bell's palsy   . Hemorrhoids    history  . High cholesterol   . History of back injury 11/19/2013  . Hyperglycemia   . Hyperlipidemia   . Hypertension   . Prostate cancer ([place])   . Prostate cancer ([place]) 06/20/2011  . Prostate carcinoma, recurrent ([place]) 08/10/2012    has H N P-CERVICAL; CERVICAL SPASM; Prostate cancer ([place]); Gynecomastia, male; Hypertension; Hyperlipidemia; Osteopenia determined by x-ray; Prediabetes; Knee pain, left; and Urinary frequency on his problem list.     is allergic to tylenol [acetaminophen].  Mr. Porrazzo had no medications administered during this visit.  SURGICAL HISTORY: Past Surgical History:  Procedure Laterality Date  .  [place]   left  . COLONOSCOPY  2008  . ESOPHAGOGASTRODUODENOSCOPY    . HERNIA REPAIR  2010  . I&D EXTREMITY Left 06/10/2015   Procedure: IRRIGATION AND DEBRIDEMENT EXTREMITY LEFT HAND EXPLORATION, nerve repair;  Surgeon: Charlotte Crumb, MD;  Location: [facility];  Service: Orthopedics;  Laterality: Left;  . KNEE SURGERY     left  . PROSTATE BIOPSY  11/06    SOCIAL HISTORY: Social History   Social History  . Marital status: Married    Spouse name: N/A  . Number of children: N/A  . Years of education: N/A   Occupational History  . Not on file.   Social History Main Topics  . Smoking status: Former Smoker    Packs/day: 2.50    Years: 3.00  .  Smokeless tobacco: Never Used  . Alcohol use No     Comment: former drinker 20 years ago  . Drug use: No  . Sexual activity: Not on file   Other Topics Concern  . Not on file   Social History Narrative  . No narrative on file    FAMILY HISTORY: Family History  Problem Relation Age of Onset  . Heart failure Mother   . Diabetes Father   . Cancer Sister   . Diabetes Brother     Review of Systems  Constitutional: Negative.   HENT: Negative.        Feels like food is stuck in his throat.   Eyes: Negative.   Respiratory: Negative.   Cardiovascular: Negative.   Gastrointestinal: Negative.  Negative for abdominal pain.  Genitourinary: Negative.   Musculoskeletal: Negative.   Skin: Negative.   Neurological: Negative.   Endo/Heme/Allergies: Negative.   Psychiatric/Behavioral: Negative.   All other systems reviewed and are negative. 14 point review of systems was performed and is negative except as detailed under history of present illness and above   PHYSICAL EXAMINATION  ECOG PERFORMANCE STATUS: 1 - Symptomatic but completely ambulatory  Vitals:   10/31/16 0917  BP: (!) 127/95  Pulse: 88  Resp: 18  Temp: 97.9 F (36.6 C)     Physical Exam  Constitutional: He is oriented to person, place, and time and well-developed, well-nourished, and in no distress.  Patient was able to get on exam table without assistance.  Wears glasses.   HENT:  Head: Normocephalic and atraumatic.  Mouth/Throat: Oropharynx is clear and moist.  Eyes: Conjunctivae and EOM are normal. Pupils are equal, round, and reactive to light.  Neck: Normal range of motion. Neck supple.  Cardiovascular: Normal rate, regular rhythm and normal heart sounds.   Pulmonary/Chest: Effort normal and breath sounds normal.  Abdominal: Soft. Bowel sounds are normal.  Musculoskeletal: Normal range of motion.  Neurological: He is alert and oriented to person, place, and time. Gait normal.  Skin: Skin is warm and  dry.  Nursing note and vitals reviewed.   LABORATORY DATA: I have reviewed the data as listed.  CBC    Component Value Date/Time   WBC 9.3 10/27/2016 1004   RBC 4.30 10/27/2016 1004   HGB 13.7 10/27/2016 1004   HCT 39.7 10/27/2016 1004   HCT 36.1 (L) 05/19/2016 1623   PLT 333 10/27/2016 1004   PLT 369 05/19/2016 1623   MCV 92.3 10/27/2016 1004   MCV 90 05/19/2016 1623   MCH 31.9 10/27/2016 1004   MCHC 34.5 10/27/2016 1004   RDW 12.6 10/27/2016 1004   RDW 13.3 05/19/2016 1623   LYMPHSABS 2.9 10/27/2016  1004   LYMPHSABS 2.3 05/19/2016 1623   MONOABS 0.6 10/27/2016 1004   EOSABS 0.4 10/27/2016 1004   EOSABS 0.3 05/19/2016 1623   BASOSABS 0.1 10/27/2016 1004   BASOSABS 0.0 05/19/2016 1623   CMP     Component Value Date/Time   NA 135 10/27/2016 1004   NA 140 11/11/2015 1547   K 4.0 10/27/2016 1004   CL 103 10/27/2016 1004   CO2 25 10/27/2016 1004   GLUCOSE 106 (H) 10/27/2016 1004   BUN 17 10/27/2016 1004   BUN 23 11/11/2015 1547   CREATININE 0.85 10/27/2016 1004   CALCIUM 9.1 10/27/2016 1004   PROT 7.2 10/27/2016 1004   PROT 7.1 11/11/2015 1547   ALBUMIN 4.2 10/27/2016 1004   ALBUMIN 4.3 11/11/2015 1547   AST 22 10/27/2016 1004   ALT 20 10/27/2016 1004   ALKPHOS 40 10/27/2016 1004   BILITOT 0.5 10/27/2016 1004   BILITOT <0.2 11/11/2015 1547   GFRNONAA >60 10/27/2016 1004   GFRAA >60 10/27/2016 1004    Results for TYERELL, [place] (MRN [id number])   Ref. Range 09/09/2015 09:46 12/09/2015 10:32 03/08/2016 10:33 06/07/2016 13:56 09/08/2016 11:34 09/30/2016 12:41 10/10/2016 12:13 10/27/2016 10:04  PSA Latest Ref Range: 0.00 - 4.00 ng/mL 0.40 0.37 0.55 0.73 1.60 1.41 1.30 1.39    Radiography: I have personally reviewed the radiological images as listed and agreed with the findings in the report. Study Result   CLINICAL DATA:  Followup prostate cancer  EXAM: CT CHEST, ABDOMEN, AND PELVIS WITH CONTRAST  TECHNIQUE: Multidetector CT imaging of the chest,  abdomen and pelvis was performed following the standard protocol during bolus administration of intravenous contrast.  CONTRAST:  168mL ISOVUE-300 IOPAMIDOL (ISOVUE-300) INJECTION 61%  COMPARISON:  03/16/2016  FINDINGS: CT CHEST FINDINGS  Cardiovascular: No significant vascular findings. Normal heart size. No pericardial effusion.  Mediastinum/Nodes: No enlarged mediastinal, hilar, or axillary lymph nodes. Thyroid gland, trachea, and esophagus demonstrate no significant findings.  Lungs/Pleura: Lungs are clear. No pleural effusion or pneumothorax.  Musculoskeletal: No chest wall mass or suspicious bone lesions identified.  CT ABDOMEN PELVIS FINDINGS  Hepatobiliary: No focal liver abnormality is seen. No gallstones, gallbladder wall thickening, or biliary dilatation.  Pancreas: Unremarkable. No pancreatic ductal dilatation or surrounding inflammatory changes.  Spleen: Normal in size without focal abnormality.  Adrenals/Urinary Tract: Normal adrenal glands. Small cyst arising from the posterior left kidney measures 1.5 cm and is unchanged from previous exam. No mass or hydronephrosis. The urinary bladder appears normal. Patent ureters.  Stomach/Bowel: Stomach is within normal limits. Appendix appears normal. No evidence of bowel wall thickening, distention, or inflammatory changes.  Vascular/Lymphatic: Calcified atherosclerotic disease involves the abdominal aorta. No aneurysm. No enlarged retroperitoneal or mesenteric adenopathy. No enlarged pelvic or inguinal lymph nodes.  Reproductive: Fiducial markers noted within the prostate bed. No mass identified.  Other: There is no ascites or focal fluid collections within the abdomen or pelvis.  Musculoskeletal: No aggressive lytic or sclerotic bone lesions. Degenerative disc disease noted within the lumbar spine.  IMPRESSION: 1. No acute findings and no evidence for metastatic disease within the  chest, abdomen or pelvis.   Electronically Signed   By: Kerby Moors M.D.   On: 09/27/2016 16:22       ASSESSMENT and THERAPY PLAN:  Adenocarcinoma of prostate, biochemical recurrence [name] Hypokalemia Osteopenia  Laboratory studies were reviewed with the patient in detail and documented in the note.   His PSA has been slowly climbing. We tried stopping his casodex, PSA  seemed to rise more quickly. He has restarted casodex. Imaging has not shown evidence of metastatic disease. At some point we may need to consider other options for therapy and we have discussed this. He is currently active and doing well.  We will keep him on his Casodex/lupron He will continue calcium, vitamin D and prolia for osteopenia.   I will order him a referral to GI for an EGD.  I will give him some samples of Nexium.  He is due for his next [name] in January.   He will return for a follow up in 3 months, with physical exam and labs including PSA.  Orders Placed This Encounter  Procedures  . CBC with Differential    Standing Status:   Future    Standing Expiration Date:   10/31/2017  . Comprehensive metabolic panel    Standing Status:   Future    Standing Expiration Date:   10/31/2017  . PSA    Standing Status:   Future    Standing Expiration Date:   10/31/2017    All questions were answered. The patient knows to call the clinic with any problems, questions or concerns. We can certainly see the patient much sooner if necessary.  This document serves as a record of services personally performed by Ancil Linsey, MD. It was created on her behalf by Martinique Casey, a trained medical scribe. The creation of this record is based on the scribe's personal observations and the provider's statements to them. This document has been checked and approved by the attending provider.  I have reviewed the above documentation for accuracy and completeness, and I agree with the above. Molli Hazard, MD

## 2016-10-31 NOTE — Patient Instructions (Signed)
[place] at [facility] Discharge Instructions  RECOMMENDATIONS MADE BY THE CONSULTANT AND ANY TEST RESULTS WILL BE SENT TO YOUR REFERRING PHYSICIAN.  Exam with Dr. Emmylou Bieker Muse today. We have referred you to the GI doctor for the acid reflux, throat discomfort.  They will call you with an appointment. Please return in January as scheduled.    Thank you for choosing [place] at [facility] to provide your oncology and hematology care.  To afford each patient quality time with our provider, please arrive at least 15 minutes before your scheduled appointment time.   Beginning January 23rd 2017 lab work for the [facility] will be done in the  Main lab at [facility] on 1st floor. If you have a lab appointment with the [facility] please come in thru the  Main Entrance and check in at the main information desk  You need to re-schedule your appointment should you arrive 10 or more minutes late.  We strive to give you quality time with our providers, and arriving late affects you and other patients whose appointments are after yours.  Also, if you no show three or more times for appointments you may be dismissed from the clinic at the providers discretion.     Again, thank you for choosing [facility].  Our hope is that these requests will decrease the amount of time that you wait before being seen by our physicians.       _____________________________________________________________  Should you have questions after your visit to [facility], please contact our office at [phone] between the hours of 8:30 a.m. and 4:30 p.m.  Voicemails left after 4:30 p.m. will not be returned until the following business day.  For prescription refill requests, have your pharmacy contact our office.         Resources For Cancer Patients and their Caregivers ? American Cancer Society: Can assist with transportation, wigs, general  needs, runs Look Good Feel Better.        613-521-7946 ? Cancer Care: Provides financial assistance, online support groups, medication/co-pay assistance.  1-800-813-HOPE 402-527-0828) ? [facility] Assists [place] cancer patients and their families through emotional , educational and financial support.  386 861 2288 ? [place] DSS Where to apply for food stamps, Medicaid and utility assistance. 6083157064 ? RCATS: Transportation to medical appointments. (904)684-0299 ? Social Security Administration: May apply for disability if have a Stage IV cancer. 204 054 4145 (516)394-5797 ? [employer], Disability and Transit Services: Assists with nutrition, care and transit needs. [place] Support Programs: @10RELATIVEDAYS @ > Cancer Support Group  2nd Tuesday of the month 1pm-2pm, Journey Room  > Creative Journey  3rd Tuesday of the month 1130am-1pm, Journey Room  > Look Good Feel Better  1st Wednesday of the month 10am-12 noon, Journey Room (Call [facility] to register [phone])

## 2016-11-16 IMAGING — DX DG THORACIC SPINE 3V
3 series · 3 of 3 positions shown · non-contrast
Comparison: 01/21/2015.

CLINICAL DATA: Subsequent encounter for one month history of
right-sided upper back pain under the right shoulder blade. No
history of injury.

EXAM:
THORACIC SPINE - 2 VIEW + SWIMMERS

[t-spine ap]
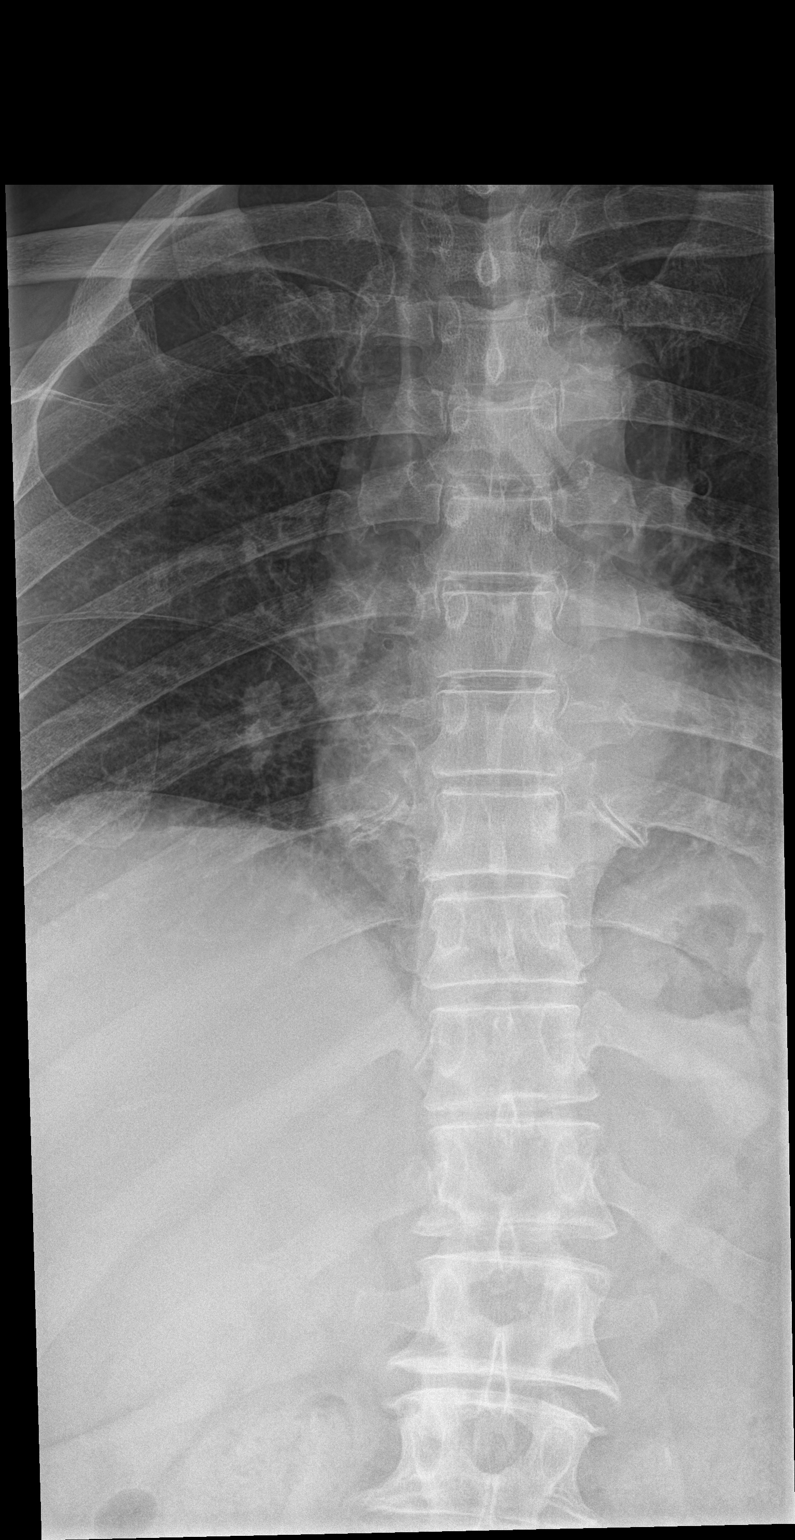

[t-spine lat]
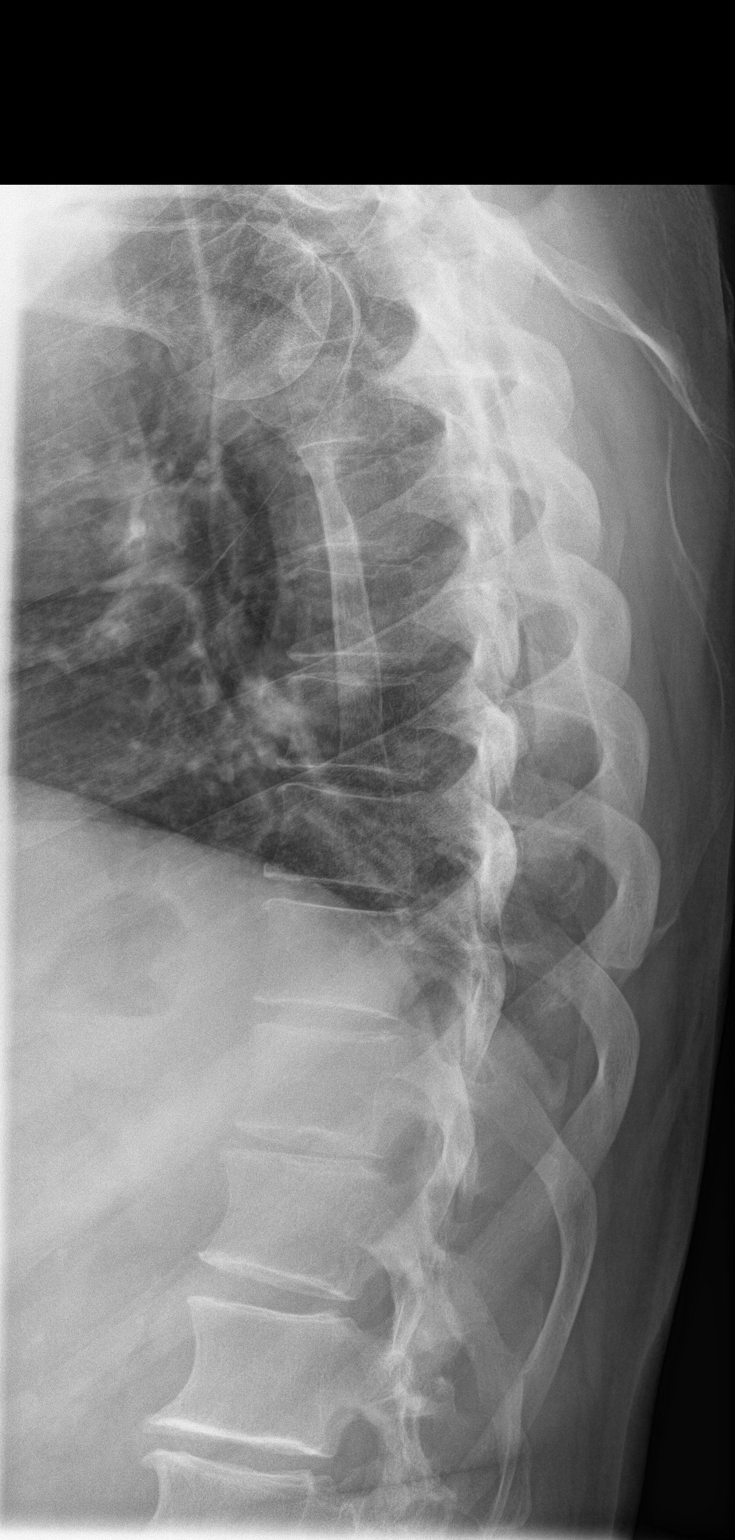

[t-spine swimmers]
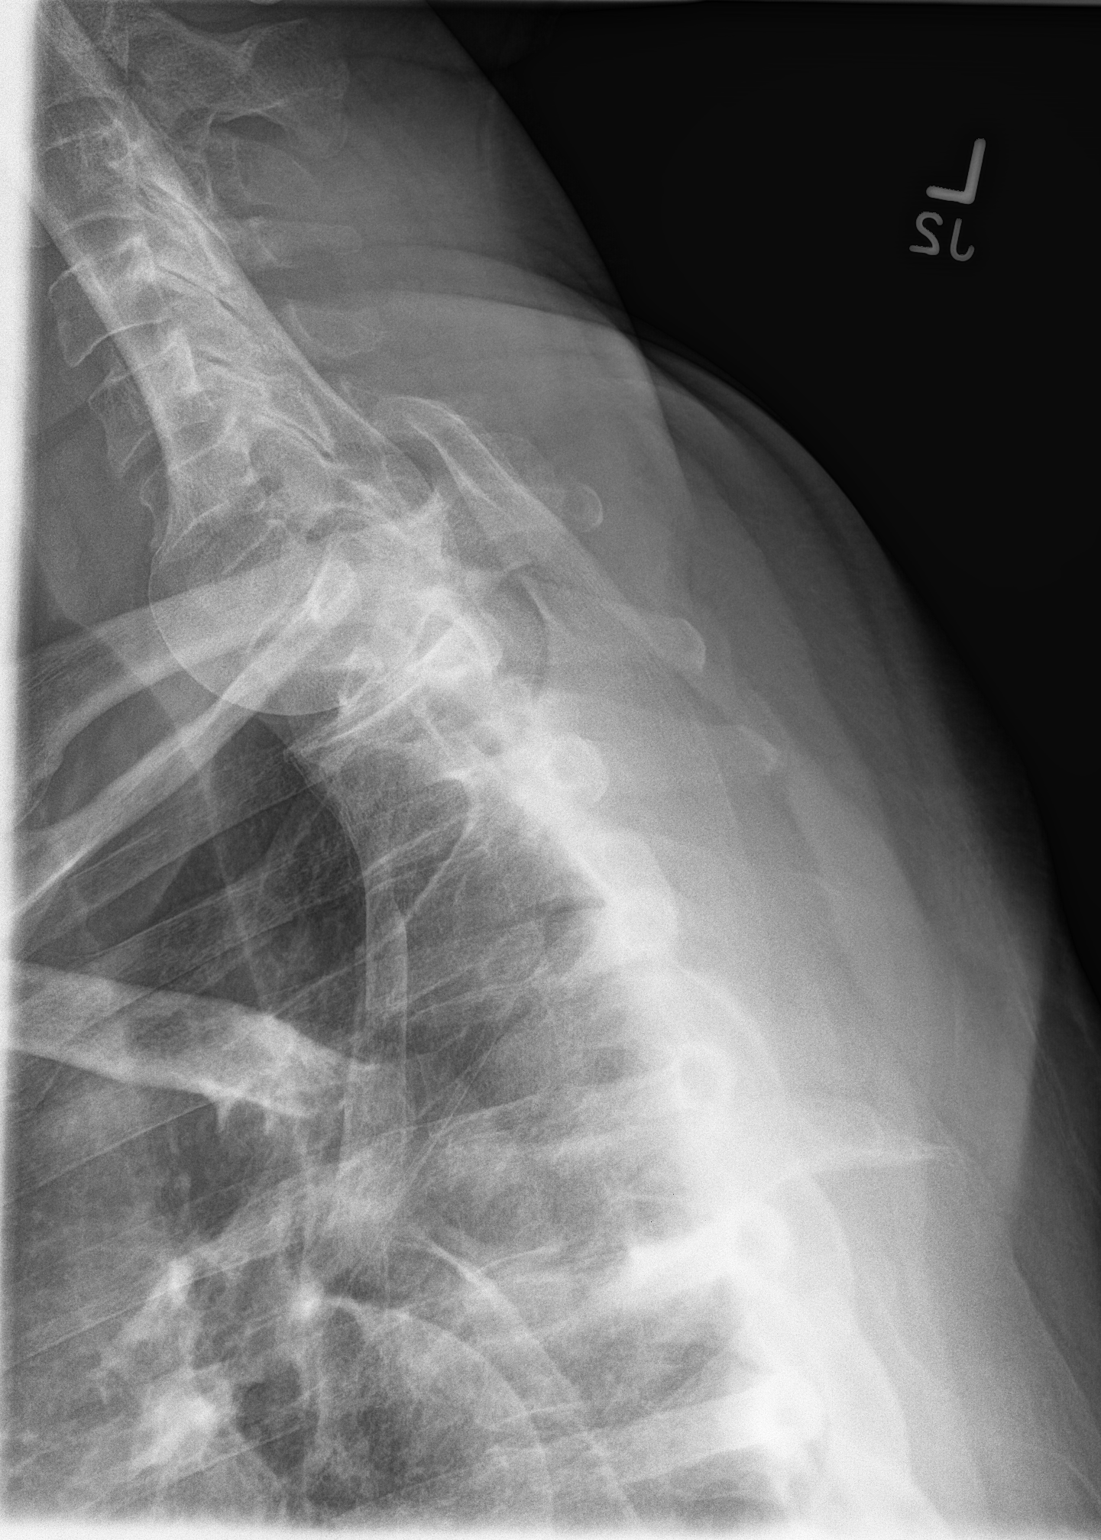

[3 of 3 positions shown; findings below may reference images not displayed]

FINDINGS: There is no evidence of thoracic spine fracture. Alignment is
normal. No other significant bone abnormalities are identified.
IMPRESSION: Stable.  No acute findings.

## 2016-11-22 ENCOUNTER — Ambulatory Visit (INDEPENDENT_AMBULATORY_CARE_PROVIDER_SITE_OTHER): Payer: Medicaid Other | Admitting: Family Medicine

## 2016-11-22 ENCOUNTER — Encounter: Payer: Self-pay | Admitting: Family Medicine

## 2016-11-22 VITALS — BP 135/83 | HR 91 | Temp 97.8°F | Ht 67.0 in | Wt 209.2 lb

## 2016-11-22 DIAGNOSIS — K2100 Gastro-esophageal reflux disease with esophagitis, without bleeding: Secondary | ICD-10-CM

## 2016-11-22 DIAGNOSIS — K219 Gastro-esophageal reflux disease without esophagitis: Secondary | ICD-10-CM | POA: Insufficient documentation

## 2016-11-22 DIAGNOSIS — S01511A Laceration without foreign body of lip, initial encounter: Secondary | ICD-10-CM | POA: Diagnosis not present

## 2016-11-22 DIAGNOSIS — C61 Malignant neoplasm of prostate: Secondary | ICD-10-CM

## 2016-11-22 MED ORDER — OMEPRAZOLE 20 MG PO CPDR: 20.0000 mg | DELAYED_RELEASE_CAPSULE | Freq: Every day | ORAL | 3 refills | Status: DC

## 2016-11-22 MED ORDER — HYDROCODONE-ACETAMINOPHEN 5-325 MG PO TABS: 1.0000 | ORAL_TABLET | Freq: Four times a day (QID) | ORAL | 0 refills | Status: DC | PRN

## 2016-11-22 NOTE — Progress Notes (Signed)
BP 135/83   Pulse 91   Temp 97.8 F (36.6 C) (Oral)   Ht 5\' 7"  (1.702 m)   Wt 209 lb 4 oz (94.9 kg)   BMI 32.77 kg/m    Subjective:    [name], male    DOB: 1951-03-22, 65 y.o.   MRN: [id number]  HPI: Walter Boone is a 65 y.o. male presenting on 11/22/2016 for Feels like food is getting stuck in throat (began about a month ago, talked to his oncologist, Dr. Whitney Muse, about this and they were supposed to refer him for an endoscopy but this has not been done  Patient is taking Zantac 150 mg daily.) and Painful blister on lip (was cutting a log with a table saw and it bounced back and hit him in the mouth; using Carmex, Lysine )   HPI Heartburn and feeling stuck in his throat Patient has been having some issues with acid and heartburn and has been put on Zantac over the past month. He feels like food gets stuck in the back of his throat and this is been going on for at least a month. He did talk to his oncologist about this and they were going to refer him to gastroenterologist that it had not been done yet. He is taking Zantac 300 mg daily and feels like it is not helping much. He denies any blood in his stool or abdominal pain. He feels like he can eat because the food gets stuck in his throat.  Lip sore Patient was working with a table saw last week and it bounced back and hit him in the face and since then is been having a sore in his mouth and he feels like it is not healing and it is very painful. He is using Carmex and 2 different topical cold sore remedies that aren't helping much with the pain. He is also trying to keep it moist most of the time and he feels like it's just not healing. He denies any drainage out of it or fevers or chills.  Patient also wants a pain medication refill for his back pain from his prostate cancer. He only uses it intermittently and about every 2-3 weeks.  Relevant past medical, surgical, family and social history reviewed and  updated as indicated. Interim medical history since our last visit reviewed. Allergies and medications reviewed and updated.  Review of Systems  Constitutional: Negative for chills and fever.  HENT: Positive for mouth sores.   Respiratory: Negative for shortness of breath and wheezing.   Cardiovascular: Negative for chest pain and leg swelling.  Gastrointestinal: Negative for abdominal distention, abdominal pain, constipation, diarrhea, nausea and vomiting.  Musculoskeletal: Positive for back pain. Negative for gait problem.  Skin: Negative for rash.  All other systems reviewed and are negative.   Per HPI unless specifically indicated above     Objective:    BP 135/83   Pulse 91   Temp 97.8 F (36.6 C) (Oral)   Ht 5\' 7"  (1.702 m)   Wt 209 lb 4 oz (94.9 kg)   BMI 32.77 kg/m   Wt Readings from Last 3 Encounters:  11/22/16 209 lb 4 oz (94.9 kg)  10/31/16 210 lb (95.3 kg)  09/30/16 209 lb 11.2 oz (95.1 kg)    Physical Exam  Constitutional: He is oriented to person, place, and time. He appears well-developed and well-nourished. No distress.  HENT:  Head:    Eyes: Conjunctivae are normal. Right  eye exhibits no discharge. Left eye exhibits no discharge. No scleral icterus.  Cardiovascular: Normal rate, regular rhythm, normal heart sounds and intact distal pulses.   No murmur heard. Pulmonary/Chest: Effort normal and breath sounds normal. No respiratory distress. He has no wheezes. He has no rales.  Abdominal: Soft. Bowel sounds are normal. He exhibits no distension. There is no tenderness. There is no rebound.  Musculoskeletal: Normal range of motion. He exhibits tenderness (Low back pain, chronic). He exhibits no edema.  Neurological: He is alert and oriented to person, place, and time. Coordination normal.  Skin: Skin is warm and dry. No rash noted. He is not diaphoretic.  Psychiatric: He has a normal mood and affect. His behavior is normal.  Nursing note and vitals  reviewed.     Assessment & Plan:   Problem List Items Addressed This Visit      Digestive   GERD (gastroesophageal reflux disease) - Primary   Relevant Medications   omeprazole (PRILOSEC) 20 MG capsule   Other Relevant Orders   Ambulatory referral to Gastroenterology     Genitourinary   Prostate cancer ([place])   Relevant Medications   HYDROcodone-acetaminophen (NORCO/VICODIN) 5-325 MG tablet    Other Visit Diagnoses    Lip laceration, initial encounter       Appears to be healing, left dry out part of the day so that it can scab over and heal.       Follow up plan: Return if symptoms worsen or fail to improve.  Counseling provided for all of the vaccine components Orders Placed This Encounter  Procedures  . Ambulatory referral to Gastroenterology    Caryl Pina, MD [facility] 11/22/2016, 11:51 AM

## 2016-12-06 ENCOUNTER — Encounter (HOSPITAL_COMMUNITY): Payer: Self-pay | Admitting: Hematology & Oncology

## 2016-12-13 ENCOUNTER — Other Ambulatory Visit (HOSPITAL_COMMUNITY): Payer: Self-pay | Admitting: Hematology & Oncology

## 2016-12-13 ENCOUNTER — Encounter (HOSPITAL_COMMUNITY): Payer: Self-pay | Admitting: Hematology & Oncology

## 2016-12-13 ENCOUNTER — Encounter (HOSPITAL_COMMUNITY): Payer: Medicaid Other | Attending: Hematology & Oncology

## 2016-12-13 ENCOUNTER — Encounter (HOSPITAL_COMMUNITY): Payer: Medicaid Other

## 2016-12-13 ENCOUNTER — Encounter (HOSPITAL_BASED_OUTPATIENT_CLINIC_OR_DEPARTMENT_OTHER): Payer: Medicaid Other | Admitting: Hematology & Oncology

## 2016-12-13 VITALS — BP 155/86 | HR 86 | Temp 98.7°F | Resp 18 | Wt 208.2 lb

## 2016-12-13 DIAGNOSIS — Z5111 Encounter for antineoplastic chemotherapy: Secondary | ICD-10-CM

## 2016-12-13 DIAGNOSIS — I10 Essential (primary) hypertension: Secondary | ICD-10-CM | POA: Diagnosis not present

## 2016-12-13 DIAGNOSIS — C61 Malignant neoplasm of prostate: Secondary | ICD-10-CM

## 2016-12-13 DIAGNOSIS — R972 Elevated prostate specific antigen [PSA]: Secondary | ICD-10-CM

## 2016-12-13 DIAGNOSIS — M8580 Other specified disorders of bone density and structure, unspecified site: Secondary | ICD-10-CM

## 2016-12-13 LAB — COMPREHENSIVE METABOLIC PANEL
ALBUMIN: 4.2 g/dL (ref 3.5–5.0)
ALK PHOS: 57 U/L (ref 38–126)
ALT: 28 U/L (ref 17–63)
AST: 23 U/L (ref 15–41)
Anion gap: 8 (ref 5–15)
BUN: 15 mg/dL (ref 6–20)
CO2: 28 mmol/L (ref 22–32)
CREATININE: 0.84 mg/dL (ref 0.61–1.24)
Calcium: 9.5 mg/dL (ref 8.9–10.3)
Chloride: 100 mmol/L — ABNORMAL LOW (ref 101–111)
GFR calc Af Amer: >60 mL/min (ref >60)
GFR calc non Af Amer: >60 mL/min (ref >60)
GLUCOSE: 125 mg/dL — AB (ref 65–99)
Potassium: 3.7 mmol/L (ref 3.5–5.1)
SODIUM: 136 mmol/L (ref 135–145)
Total Bilirubin: 0.4 mg/dL (ref 0.3–1.2)
Total Protein: 7.8 g/dL (ref 6.5–8.1)

## 2016-12-13 LAB — CBC WITH DIFFERENTIAL/PLATELET
BASOS PCT: 1 %
Basophils Absolute: 0.1 10*3/uL (ref 0.0–0.1)
Eosinophils Absolute: 0.4 10*3/uL (ref 0.0–0.7)
Eosinophils Relative: 4 %
HCT: 41.1 % (ref 39.0–52.0)
HEMOGLOBIN: 14 g/dL (ref 13.0–17.0)
LYMPHS ABS: 2.8 10*3/uL (ref 0.7–4.0)
Lymphocytes Relative: 29 %
MCH: 31.5 pg (ref 26.0–34.0)
MCHC: 34.1 g/dL (ref 30.0–36.0)
MCV: 92.4 fL (ref 78.0–100.0)
Monocytes Absolute: 0.6 10*3/uL (ref 0.1–1.0)
Monocytes Relative: 6 %
NEUTROS PCT: 60 %
Neutro Abs: 6 10*3/uL (ref 1.7–7.7)
Platelets: 404 10*3/uL — ABNORMAL HIGH (ref 150–400)
RBC: 4.45 MIL/uL (ref 4.22–5.81)
RDW: 12.5 % (ref 11.5–15.5)
WBC: 9.9 10*3/uL (ref 4.0–10.5)

## 2016-12-13 LAB — PSA: PSA: 1.5 ng/mL (ref 0.00–4.00)

## 2016-12-13 MED ORDER — SOLIFENACIN SUCCINATE 10 MG PO TABS
10.0000 mg | ORAL_TABLET | Freq: Every day | ORAL | 6 refills | Status: DC
Start: 2016-12-13 — End: 2017-04-26

## 2016-12-13 MED ORDER — LEUPROLIDE ACETATE (3 MONTH) 22.5 MG IM KIT
22.5000 mg | PACK | Freq: Once | INTRAMUSCULAR | Status: AC
  Administered 2016-12-13: 22.5 mg via INTRAMUSCULAR
  Filled 2016-12-13: qty 22.5

## 2016-12-13 NOTE — Progress Notes (Signed)
Walter Boone tolerated [name] injection well without complaints or incident.[name] given IM in LUOQ of gluteal muscle. VSS Pt discharged self ambulatory in satisfactory condition

## 2016-12-13 NOTE — Patient Instructions (Signed)
[facility] at [facility] Discharge Instructions  RECOMMENDATIONS MADE BY THE CONSULTANT AND ANY TEST RESULTS WILL BE SENT TO YOUR REFERRING PHYSICIAN.  Received [name] injection today. Follow-up as scheduled. Call clinic for any questions or concerns  Thank you for choosing [facility] at [facility] to provide your oncology and hematology care.  To afford each patient quality time with our provider, please arrive at least 15 minutes before your scheduled appointment time.    If you have a lab appointment with the Cancer Center please come in thru the  Main Entrance and check in at the main information desk  You need to re-schedule your appointment should you arrive 10 or more minutes late.  We strive to give you quality time with our providers, and arriving late affects you and other patients whose appointments are after yours.  Also, if you no show three or more times for appointments you may be dismissed from the clinic at the providers discretion.     Again, thank you for choosing [facility].  Our hope is that these requests will decrease the amount of time that you wait before being seen by our physicians.       _____________________________________________________________  Should you have questions after your visit to [facility], please contact our office at [phone] between the hours of 8:30 a.m. and 4:30 p.m.  Voicemails left after 4:30 p.m. will not be returned until the following business day.  For prescription refill requests, have your pharmacy contact our office.       Resources For Cancer Patients and their Caregivers ? American Cancer Society: Can assist with transportation, wigs, general needs, runs Look Good Feel Better.        1-888-227-6333 ? Cancer Care: Provides financial assistance, online support groups, medication/co-pay assistance.  1-800-813-HOPE (4673) ? [facility] Assists [place] cancer patients and their families through emotional , educational and financial support.  336-427-4357 ? [place] DSS Where to apply for food stamps, Medicaid and utility assistance. 336-342-1394 ? RCATS: Transportation to medical appointments. 336-347-2287 ? Social Security Administration: May apply for disability if have a Stage IV cancer. 336-342-7796 1-800-772-1213 ? [place] Aging, Disability and Transit Services: Assists with nutrition, care and transit needs. 336-349-2343  Cancer Center Support Programs: @10RELATIVEDAYS@ > Cancer Support Group  2nd Tuesday of the month 1pm-2pm, Journey Room  > Creative Journey  3rd Tuesday of the month 1130am-1pm, Journey Room  > Look Good Feel Better  1st Wednesday of the month 10am-12 noon, Journey Room (Call American [employer] to register 1-800-395-5775)   

## 2016-12-13 NOTE — Progress Notes (Signed)
Worthy Rancher, MD [place] [address]   DIAGNOSIS: Prostate cancer   Staging form: Prostate, AJCC 7th Edition     Clinical: Stage IIC (pT2c, N0, M0) - Signed by Baird Cancer, PA on 06/20/2011  CURRENT THERAPY: [name] and Casodex ([name] 08/15/2012)  Osteopenia on bone density on 12/2015 Last imaging studies CT chest abdomen and pelvis performed in 09/2016 negative.   Bone Scan 06/2016 negative for metastatic disease    Prostate cancer ([place])   06/20/2011 Initial Diagnosis    Prostate cancer ([place])      01/12/2016 Imaging    Bone density- This patient is considered osteopenic by World Healh Organization (WHO) Criteria.       03/16/2016 Imaging    CT CAP- No evidence of metastatic disease within the chest, abdomen, or pelvis. No other acute findings identified.      06/17/2016 Imaging    Bone scan- 1. Negative for metastatic pattern.      07/11/2016 Miscellaneous    Prolia every 6 months for osteopenia.      09/12/2016 -  Chemotherapy    Casodex/Depo-[name] every 3 months       09/27/2016 Imaging    CT CAP -No acute findings and no evidence for metastatic disease within the chest, abdomen or pelvis.           INTERVAL HISTORY: Walter Boone 66 y.o. male returns for follow-up of adenocarcinoma of the prostate. The patient was treated with definitive radiation therapy several years ago. He then developed a climb in his PSA. He was started on [name] in 2012. Casodex was added in October 2015 when his PSA climbed from 0.19 to 0.48 pg/ml. He has had problems with hot flashes and bilateral gynecomastia. He also has had problems with insomnia since starting therapy.   Walter Boone is unaccompanied. He feels good today. I have personally reviewed and went over the laboratory results with the patient.   The patient is requesting a prescription for Vesicare 10 mg. He was given samples and would like to continue taking this.  He reports resolving cough  and congestion. This was much worse about a week ago. He is scheduled to see his PCP Dr. Warrick Parisian about this tomorrow.  He denies abdominal pain. He denies any new pain, just his old back pain. His appetite is good.  He continues to be fairly active. GI referral did not happen after his last visit. Reflux is better on prilosec.   MEDICAL HISTORY: Past Medical History:  Diagnosis Date  . Fatty liver   . Gynecomastia, male 01/11/2013   Secondary to prostate ca Tx.   . H/O Bell's palsy   . Hemorrhoids    history  . High cholesterol   . History of back injury 11/19/2013  . Hyperglycemia   . Hyperlipidemia   . Hypertension   . Prostate cancer ([place])   . Prostate cancer ([place]) 06/20/2011  . Prostate carcinoma, recurrent ([place]) 08/10/2012    has H N P-CERVICAL; CERVICAL SPASM; Prostate cancer ([place]); Gynecomastia, male; Hypertension; Hyperlipidemia; Osteopenia determined by x-ray; Prediabetes; Knee pain, left; Urinary frequency; and GERD (gastroesophageal reflux disease) on his problem list.     is allergic to tylenol [acetaminophen].  Walter Boone had no medications administered during this visit.  SURGICAL HISTORY: Past Surgical History:  Procedure Laterality Date  . [place]   left  . COLONOSCOPY  2008  . ESOPHAGOGASTRODUODENOSCOPY    . HERNIA REPAIR  2010  . I&D EXTREMITY Left 06/10/2015   Procedure: IRRIGATION AND DEBRIDEMENT EXTREMITY LEFT HAND EXPLORATION, nerve repair;  Surgeon: Charlotte Crumb, MD;  Location: [facility];  Service: Orthopedics;  Laterality: Left;  . KNEE SURGERY     left  . PROSTATE BIOPSY  11/06    SOCIAL HISTORY: Social History   Social History  . Marital status: Married    Spouse name: N/A  . Number of children: N/A  . Years of education: N/A   Occupational History  . Not on file.   Social History Main Topics  . Smoking status: Former Smoker    Packs/day: 2.50    Years: 3.00  . Smokeless tobacco: Never Used  . Alcohol use No      Comment: former drinker 20 years ago  . Drug use: No  . Sexual activity: Not on file   Other Topics Concern  . Not on file   Social History Narrative  . No narrative on file    FAMILY HISTORY: Family History  Problem Relation Age of Onset  . Heart failure Mother   . Diabetes Father   . Cancer Sister   . Diabetes Brother     Review of Systems  Constitutional: Negative.   HENT: Positive for congestion (resolving).   Eyes: Negative.   Respiratory: Positive for cough (resolving).   Cardiovascular: Negative.   Gastrointestinal: Negative.  Negative for abdominal pain.  Genitourinary: Negative.   Musculoskeletal: Positive for back pain (chronic).  Skin: Negative.   Neurological: Negative.   Endo/Heme/Allergies: Negative.   Psychiatric/Behavioral: Negative.   All other systems reviewed and are negative. 14 point review of systems was performed and is negative except as detailed under history of present illness and above   PHYSICAL EXAMINATION  ECOG PERFORMANCE STATUS: 1 - Symptomatic but completely ambulatory Vitals with BMI 12/13/2016  Height   Weight 208 lbs 3 oz  BMI   Systolic 99991111  Diastolic 86  Pulse 86  Respirations 18    Physical Exam  Constitutional: He is oriented to person, place, and time and well-developed, well-nourished, and in no distress.  Patient was able to get on exam table without assistance.  Wears glasses.   HENT:  Head: Normocephalic and atraumatic.  Mouth/Throat: Oropharynx is clear and moist.  Eyes: Conjunctivae and EOM are normal. Pupils are equal, round, and reactive to light. No scleral icterus.  Neck: Normal range of motion. Neck supple.  Cardiovascular: Normal rate, regular rhythm and normal heart sounds.   Pulmonary/Chest: Effort normal and breath sounds normal.  Abdominal: Soft. Bowel sounds are normal. He exhibits no distension and no mass. There is no tenderness. There is no rebound and no guarding.  Musculoskeletal: Normal range  of motion.  Lymphadenopathy:    He has no cervical adenopathy.  Neurological: He is alert and oriented to person, place, and time. Gait normal.  Skin: Skin is warm and dry.  Psychiatric: Mood, memory, affect and judgment normal.  Nursing note and vitals reviewed. 14 point review of systems was performed and is negative except as detailed under history of present illness and above   LABORATORY DATA: I have reviewed the data as listed.  CBC    Component Value Date/Time   WBC 9.9 12/13/2016 1301   RBC 4.45 12/13/2016 1301   HGB 14.0 12/13/2016 1301   HCT 41.1 12/13/2016 1301   HCT 36.1 (L) 05/19/2016 1623   PLT 404 (H) 12/13/2016 1301   PLT 369 05/19/2016 1623   MCV 92.4  12/13/2016 1301   MCV 90 05/19/2016 1623   MCH 31.5 12/13/2016 1301   MCHC 34.1 12/13/2016 1301   RDW 12.5 12/13/2016 1301   RDW 13.3 05/19/2016 1623   LYMPHSABS 2.8 12/13/2016 1301   LYMPHSABS 2.3 05/19/2016 1623   MONOABS 0.6 12/13/2016 1301   EOSABS 0.4 12/13/2016 1301   EOSABS 0.3 05/19/2016 1623   BASOSABS 0.1 12/13/2016 1301   BASOSABS 0.0 05/19/2016 1623   CMP     Component Value Date/Time   NA 136 12/13/2016 1301   NA 140 11/11/2015 1547   K 3.7 12/13/2016 1301   CL 100 (L) 12/13/2016 1301   CO2 28 12/13/2016 1301   GLUCOSE 125 (H) 12/13/2016 1301   BUN 15 12/13/2016 1301   BUN 23 11/11/2015 1547   CREATININE 0.84 12/13/2016 1301   CALCIUM 9.5 12/13/2016 1301   PROT 7.8 12/13/2016 1301   PROT 7.1 11/11/2015 1547   ALBUMIN 4.2 12/13/2016 1301   ALBUMIN 4.3 11/11/2015 1547   AST 23 12/13/2016 1301   ALT 28 12/13/2016 1301   ALKPHOS 57 12/13/2016 1301   BILITOT 0.4 12/13/2016 1301   BILITOT <0.2 11/11/2015 1547   GFRNONAA >60 12/13/2016 1301   GFRAA >60 12/13/2016 1301   Results for LISH, TROUTMAN (MRN [id number])  Ref. Range 06/07/2016 13:56 09/08/2016 11:34 09/30/2016 12:41 10/10/2016 12:13 10/27/2016 10:04 12/13/2016 13:01  PSA Latest Ref Range: 0.00 - 4.00 ng/mL 0.73 1.60 1.41  1.30 1.39 1.50    Radiography: I have personally reviewed the radiological images as listed and agreed with the findings in the report. Study Result   CLINICAL DATA:  Followup prostate cancer  EXAM: CT CHEST, ABDOMEN, AND PELVIS WITH CONTRAST  TECHNIQUE: Multidetector CT imaging of the chest, abdomen and pelvis was performed following the standard protocol during bolus administration of intravenous contrast.  CONTRAST:  15mL ISOVUE-300 IOPAMIDOL (ISOVUE-300) INJECTION 61%  COMPARISON:  03/16/2016  FINDINGS: CT CHEST FINDINGS  Cardiovascular: No significant vascular findings. Normal heart size. No pericardial effusion.  Mediastinum/Nodes: No enlarged mediastinal, hilar, or axillary lymph nodes. Thyroid gland, trachea, and esophagus demonstrate no significant findings.  Lungs/Pleura: Lungs are clear. No pleural effusion or pneumothorax.  Musculoskeletal: No chest wall mass or suspicious bone lesions identified.  CT ABDOMEN PELVIS FINDINGS  Hepatobiliary: No focal liver abnormality is seen. No gallstones, gallbladder wall thickening, or biliary dilatation.  Pancreas: Unremarkable. No pancreatic ductal dilatation or surrounding inflammatory changes.  Spleen: Normal in size without focal abnormality.  Adrenals/Urinary Tract: Normal adrenal glands. Small cyst arising from the posterior left kidney measures 1.5 cm and is unchanged from previous exam. No mass or hydronephrosis. The urinary bladder appears normal. Patent ureters.  Stomach/Bowel: Stomach is within normal limits. Appendix appears normal. No evidence of bowel wall thickening, distention, or inflammatory changes.  Vascular/Lymphatic: Calcified atherosclerotic disease involves the abdominal aorta. No aneurysm. No enlarged retroperitoneal or mesenteric adenopathy. No enlarged pelvic or inguinal lymph nodes.  Reproductive: Fiducial markers noted within the prostate bed. No mass  identified.  Other: There is no ascites or focal fluid collections within the abdomen or pelvis.  Musculoskeletal: No aggressive lytic or sclerotic bone lesions. Degenerative disc disease noted within the lumbar spine.  IMPRESSION: 1. No acute findings and no evidence for metastatic disease within the chest, abdomen or pelvis.   Electronically Signed   By: Kerby Moors M.D.   On: 09/27/2016 16:22     ASSESSMENT and THERAPY PLAN:  Adenocarcinoma of prostate, biochemical recurrence [name] Hypokalemia Osteopenia  Laboratory studies were reviewed with the patient in detail and documented in the note.   His PSA has been slowly climbing. We tried stopping his casodex, PSA seemed to rise more quickly. He has restarted casodex. Imaging has not shown evidence of metastatic disease. At some point we may need to consider other options for therapy and we have discussed this. He is currently active and doing well.  We will keep him on his Casodex/lupron He will continue calcium, vitamin D and prolia for osteopenia.   We checked on his GI referral, he was already established with Dr. Laural Golden, we have referred him over to his clinic. We had put in a referral to [facility].   I have written a prescription for Vesicare 10 mg.  He will return for Prolia injection with labs towards the end of January 2018.  He will return for follow up in 3 months. IF psa has gone up further at that visit we will discuss change in therapy.  Orders Placed This Encounter  Procedures  . CBC with Differential    Standing Status:   Future    Standing Expiration Date:   12/13/2017  . Comprehensive metabolic panel    Standing Status:   Future    Standing Expiration Date:   12/13/2017  . PSA    Standing Status:   Future    Standing Expiration Date:   12/13/2017   Meds ordered this encounter  Medications  . solifenacin (VESICARE) 10 MG tablet    Sig: Take 1 tablet (10 mg total) by mouth daily.     Dispense:  30 tablet    Refill:  6   All questions were answered. The patient knows to call the clinic with any problems, questions or concerns. We can certainly see the patient much sooner if necessary.  This document serves as a record of services personally performed by Ancil Linsey, MD. It was created on her behalf by Arlyce Harman, a trained medical scribe. The creation of this record is based on the scribe's personal observations and the provider's statements to them. This document has been checked and approved by the attending provider.  I have reviewed the above documentation for accuracy and completeness, and I agree with the above. Molli Hazard, MD

## 2016-12-13 NOTE — Patient Instructions (Addendum)
[facility] at [facility] Discharge Instructions  RECOMMENDATIONS MADE BY THE CONSULTANT AND ANY TEST RESULTS WILL BE SENT TO YOUR REFERRING PHYSICIAN.  You were seen today by Dr. Youlanda Roys will continue [name] and Prolia as scheduled Follow up in 3 months with labs You will be referred to Dr. Laural Golden for dysphagia   Thank you for choosing [facility] at [facility] to provide your oncology and hematology care.  To afford each patient quality time with our provider, please arrive at least 15 minutes before your scheduled appointment time.    If you have a lab appointment with the [facility] please come in thru the  Main Entrance and check in at the main information desk  You need to re-schedule your appointment should you arrive 10 or more minutes late.  We strive to give you quality time with our providers, and arriving late affects you and other patients whose appointments are after yours.  Also, if you no show three or more times for appointments you may be dismissed from the clinic at the providers discretion.     Again, thank you for choosing [facility].  Our hope is that these requests will decrease the amount of time that you wait before being seen by our physicians.       _____________________________________________________________  Should you have questions after your visit to [facility], please contact our office at [phone] between the hours of 8:30 a.m. and 4:30 p.m.  Voicemails left after 4:30 p.m. will not be returned until the following business day.  For prescription refill requests, have your pharmacy contact our office.       Resources For Cancer Patients and their Caregivers ? American Cancer Society: Can assist with transportation, wigs, general needs, runs Look Good Feel Better.        (412) 136-2155 ? Cancer Care: Provides financial assistance, online support groups,  medication/co-pay assistance.  1-800-813-HOPE 437-597-0920) ? [facility] Assists [place] cancer patients and their families through emotional , educational and financial support.  404-783-4263 ? [place] DSS Where to apply for food stamps, Medicaid and utility assistance. 260 211 6243 ? [employer]: Transportation to medical appointments. (779)758-9560 ? Social Security Administration: May apply for disability if have a Stage IV cancer. (650)550-2291 867-783-5945 ? [employer], Disability and Transit Services: Assists with nutrition, care and transit needs. [place] Support Programs: @10RELATIVEDAYS @ > Cancer Support Group  2nd Tuesday of the month 1pm-2pm, Journey Room  > Creative Journey  3rd Tuesday of the month 1130am-1pm, Journey Room  > Look Good Feel Better  1st Wednesday of the month 10am-12 noon, Journey Room (Call [facility] to register [phone])

## 2016-12-14 ENCOUNTER — Ambulatory Visit (INDEPENDENT_AMBULATORY_CARE_PROVIDER_SITE_OTHER): Payer: Medicaid Other | Admitting: Family Medicine

## 2016-12-14 ENCOUNTER — Encounter (INDEPENDENT_AMBULATORY_CARE_PROVIDER_SITE_OTHER): Payer: Self-pay | Admitting: Internal Medicine

## 2016-12-14 ENCOUNTER — Encounter: Payer: Self-pay | Admitting: Family Medicine

## 2016-12-14 VITALS — BP 133/86 | HR 90 | Temp 97.6°F | Ht 67.0 in | Wt 211.0 lb

## 2016-12-14 DIAGNOSIS — J209 Acute bronchitis, unspecified: Secondary | ICD-10-CM | POA: Diagnosis not present

## 2016-12-14 MED ORDER — AZITHROMYCIN 250 MG PO TABS: ORAL_TABLET | ORAL | 0 refills | Status: DC

## 2016-12-14 NOTE — Progress Notes (Signed)
BP 133/86   Pulse 90   Temp 97.6 F (36.4 C) (Oral)   Ht 5\' 7"  (1.702 m)   Wt 211 lb (95.7 kg)   BMI 33.05 kg/m    Subjective:    [name], male    DOB: 1951-07-02, 66 y.o.   MRN: [id number]  HPI: Walter Boone is a 66 y.o. male presenting on 12/14/2016 for Cough and chest congestion (x 10 days; has tried Theraflu, Copywriter, advertising Plus and Nyquil) and Sinus congestion, sore throat   HPI Cough and congestion Patient has been having cough and congestion that's been going on for 10 days. He has tried TheraFlu and Alka-Seltzer and NyQuil and Vicks without much success. He is still having a lot of chest congestion and coughing and sore throat and postnasal drainage and sinus pressure despite all of these. He denies any fevers or chills or shortness of breath or wheezing. He denies any sick contacts that he knows of.  Relevant past medical, surgical, family and social history reviewed and updated as indicated. Interim medical history since our last visit reviewed. Allergies and medications reviewed and updated.  Review of Systems  Constitutional: Negative for chills and fever.  HENT: Positive for congestion, postnasal drip, rhinorrhea, sinus pressure, sneezing and sore throat. Negative for ear discharge, ear pain and voice change.   Eyes: Negative for pain, discharge, redness and visual disturbance.  Respiratory: Positive for cough. Negative for chest tightness, shortness of breath and wheezing.   Cardiovascular: Negative for chest pain and leg swelling.  Musculoskeletal: Negative for gait problem.  Skin: Negative for rash.  All other systems reviewed and are negative.   Per HPI unless specifically indicated above     Objective:    BP 133/86   Pulse 90   Temp 97.6 F (36.4 C) (Oral)   Ht 5\' 7"  (1.702 m)   Wt 211 lb (95.7 kg)   BMI 33.05 kg/m   Wt Readings from Last 3 Encounters:  12/14/16 211 lb (95.7 kg)  12/13/16 208 lb 3.2 oz (94.4 kg)  11/22/16  209 lb 4 oz (94.9 kg)    Physical Exam  Constitutional: He is oriented to person, place, and time. He appears well-developed and well-nourished. No distress.  HENT:  Right Ear: Tympanic membrane, external ear and ear canal normal.  Left Ear: Tympanic membrane, external ear and ear canal normal.  Nose: Mucosal edema and rhinorrhea present. No sinus tenderness. No epistaxis. Right sinus exhibits no maxillary sinus tenderness and no frontal sinus tenderness. Left sinus exhibits no maxillary sinus tenderness and no frontal sinus tenderness.  Mouth/Throat: Uvula is midline and mucous membranes are normal. Posterior oropharyngeal edema and posterior oropharyngeal erythema present. No oropharyngeal exudate or tonsillar abscesses.  Eyes: Conjunctivae are normal. Right eye exhibits no discharge. No scleral icterus.  Neck: Neck supple. No thyromegaly present.  Cardiovascular: Normal rate, regular rhythm, normal heart sounds and intact distal pulses.   No murmur heard. Pulmonary/Chest: Effort normal and breath sounds normal. No respiratory distress. He has no wheezes. He has no rales.  Musculoskeletal: Normal range of motion. He exhibits no edema.  Lymphadenopathy:    He has no cervical adenopathy.  Neurological: He is alert and oriented to person, place, and time. Coordination normal.  Skin: Skin is warm and dry. No rash noted. He is not diaphoretic.  Psychiatric: He has a normal mood and affect. His behavior is normal.  Nursing note and vitals reviewed.     Assessment &  Plan:   Problem List Items Addressed This Visit    None    Visit Diagnoses    Acute bronchitis, unspecified organism    -  Primary   Relevant Medications   azithromycin (ZITHROMAX) 250 MG tablet       Follow up plan: Return if symptoms worsen or fail to improve.  Counseling provided for all of the vaccine components No orders of the defined types were placed in this encounter.   Caryl Pina, MD [facility] 12/14/2016, 2:38 PM

## 2016-12-16 ENCOUNTER — Telehealth: Payer: Self-pay | Admitting: Family Medicine

## 2016-12-16 MED ORDER — PHENOL 1.4 % MT LIQD: 1.0000 | OROMUCOSAL | 0 refills | Status: DC | PRN

## 2016-12-16 NOTE — Telephone Encounter (Signed)
Patient aware and verbalizes understanding. 

## 2016-12-18 ENCOUNTER — Encounter (HOSPITAL_COMMUNITY): Payer: Self-pay | Admitting: Hematology & Oncology

## 2016-12-19 ENCOUNTER — Telehealth (HOSPITAL_COMMUNITY): Payer: Self-pay | Admitting: *Deleted

## 2016-12-19 NOTE — Telephone Encounter (Signed)
Per Kirby Crigler PA-C, pt should take 2000 units daily OTC. I advised the pharmacist of above.

## 2016-12-23 ENCOUNTER — Encounter: Payer: Self-pay | Admitting: Pediatrics

## 2016-12-23 ENCOUNTER — Telehealth: Payer: Self-pay | Admitting: Family Medicine

## 2016-12-23 ENCOUNTER — Ambulatory Visit (INDEPENDENT_AMBULATORY_CARE_PROVIDER_SITE_OTHER): Payer: Medicaid Other | Admitting: Pediatrics

## 2016-12-23 VITALS — BP 139/89 | HR 89 | Temp 97.2°F | Ht 67.0 in | Wt 210.4 lb

## 2016-12-23 DIAGNOSIS — J029 Acute pharyngitis, unspecified: Secondary | ICD-10-CM

## 2016-12-23 DIAGNOSIS — J019 Acute sinusitis, unspecified: Secondary | ICD-10-CM

## 2016-12-23 LAB — RAPID STREP SCREEN (MED CTR MEBANE ONLY): Strep Gp A Ag, IA W/Reflex: NEGATIVE

## 2016-12-23 LAB — CULTURE, GROUP A STREP

## 2016-12-23 MED ORDER — FLUTICASONE PROPIONATE 50 MCG/ACT NA SUSP: 2.0000 | Freq: Every day | NASAL | 6 refills | Status: DC

## 2016-12-23 MED ORDER — AMOXICILLIN-POT CLAVULANATE 875-125 MG PO TABS: 1.0000 | ORAL_TABLET | Freq: Two times a day (BID) | ORAL | 0 refills | Status: DC

## 2016-12-23 NOTE — Telephone Encounter (Signed)
Pt has persistent sore throat appt scheduled

## 2016-12-23 NOTE — Patient Instructions (Addendum)
Netipot with distilled water 2-3 times a day to clear out sinuses Or Normal saline nasal spray Flonase steroid nasal spray two sprays every day Antihistamine daily such as cetirizine Lots of fluids

## 2016-12-23 NOTE — Progress Notes (Signed)
  Subjective:   [name], male    DOB: [birth year], 66 y.o.   MRN: [id number] CC: Sore Throat  HPI: Walter Boone is a 66 y.o. male presenting for Sore Throat  Ongoing for past 2-3 weeks Was seen 10 days ago with cough Now sore throat is bothering him most Does have some nasal congestion, not running out of his nose Coughing more when he lies down at night Ears hurt some No fevers, achy at times Appetite down because of throat pain  Relevant past medical, surgical, family and social history reviewed. Allergies and medications reviewed and updated. History  Smoking Status  . Former Smoker  . Packs/day: 2.50  . Years: 3.00  Smokeless Tobacco  . Never Used   ROS: Per HPI   Objective:    BP 139/89   Pulse 89   Temp 97.2 F (36.2 C) (Oral)   Ht 5\' 7"  (1.702 m)   Wt 210 lb 6.4 oz (95.4 kg)   BMI 32.95 kg/m   Wt Readings from Last 3 Encounters:  12/23/16 210 lb 6.4 oz (95.4 kg)  12/14/16 211 lb (95.7 kg)  12/13/16 208 lb 3.2 oz (94.4 kg)    Gen: NAD, alert, cooperative with exam, NCAT EYES: EOMI, no conjunctival injection, or no icterus ENT:  TMs dull gray b/l, OP with mild erythema, max sinuses mildly TTP LYMPH: no cervical LAD CV: NRRR, normal S1/S2, no murmur, distal pulses 2+ b/l Resp: CTABL, no wheezes, normal WOB Ext: No edema, warm Neuro: Alert and oriented MSK: normal muscle bulk  Assessment & Plan:  Walter Boone was seen today for sore throat.  Diagnoses and all orders for this visit:  Acute sinusitis, recurrence not specified, unspecified location Discussed symptomatic care, start below -     amoxicillin-clavulanate (AUGMENTIN) 875-125 MG tablet; Take 1 tablet by mouth 2 (two) times daily. -     fluticasone (FLONASE) 50 MCG/ACT nasal spray; Place 2 sprays into both nostrils daily.  Sore throat Rapid strep negative Suspect post-nasal drip contributing to pain, treating as above -     Rapid strep screen (not at [facility]) -      Culture, Group A Strep  Other orders -     Culture, Group A Strep   Follow up plan: As needed Assunta Found, MD [place]

## 2016-12-26 LAB — CULTURE, GROUP A STREP: Strep A Culture: NEGATIVE

## 2016-12-30 ENCOUNTER — Ambulatory Visit (INDEPENDENT_AMBULATORY_CARE_PROVIDER_SITE_OTHER): Payer: Medicaid Other | Admitting: Family Medicine

## 2016-12-30 ENCOUNTER — Encounter: Payer: Self-pay | Admitting: Family Medicine

## 2016-12-30 VITALS — BP 133/81 | HR 91 | Temp 97.0°F | Ht 67.0 in | Wt 211.6 lb

## 2016-12-30 DIAGNOSIS — J029 Acute pharyngitis, unspecified: Secondary | ICD-10-CM

## 2016-12-30 MED ORDER — PREDNISONE 20 MG PO TABS: ORAL_TABLET | ORAL | 0 refills | Status: DC

## 2016-12-30 NOTE — Progress Notes (Signed)
BP 133/81   Pulse 91   Temp 97 F (36.1 C) (Oral)   Ht 5\' 7"  (1.702 m)   Wt 211 lb 9.6 oz (96 kg)   BMI 33.14 kg/m    Subjective:    [name], male    DOB: 01/05/1951, 66 y.o.   MRN: [id number]  HPI: Walter Boone is a 66 y.o. male presenting on 12/30/2016 for Sore Throat (started about Christmas time, Zpak helped first time w/ congestion, then came back did chloraseptic spray & losanges, then saw Dr. Evette Doffing another abx & flonase, this has not gotten better. no fever, having issues eating)   HPI Sore throat Patient has been having a sore throat that's been going on for the past month. He was initially treated with a Z-Pak and was using Chloraseptic spray and lozenges. He then came back and saw another physician about a week ago because it was persisting and was given Augmentin and Flonase which she has been using consistently along with a cough suppressant over the past week. He says he is not getting any better. He denies any shortness of breath or wheezing. He has not had much of a cough anymore and she is mainly that his throat is sore and dry and burning. He says he is having issues eating and sometimes he feels like the food gets stuck in his throat. He was recently diagnosed with GERD and was started on omeprazole but says a lot of his acid reflux symptoms are gone except for the food catching in his throat. He denies any sick contacts that he knows of. He does admit that he has a wood burning stove and dry air but recently did just get a humidifier couple days ago.  Relevant past medical, surgical, family and social history reviewed and updated as indicated. Interim medical history since our last visit reviewed. Allergies and medications reviewed and updated.  Review of Systems  Constitutional: Negative for chills and fever.  HENT: Positive for congestion, sinus pressure and sore throat. Negative for ear discharge, ear pain, postnasal drip, rhinorrhea,  sneezing and voice change.   Eyes: Negative for pain, discharge, redness and visual disturbance.  Respiratory: Positive for cough. Negative for shortness of breath and wheezing.   Cardiovascular: Negative for chest pain and leg swelling.  Musculoskeletal: Negative for gait problem.  Skin: Negative for rash.  All other systems reviewed and are negative.   Per HPI unless specifically indicated above     Objective:    BP 133/81   Pulse 91   Temp 97 F (36.1 C) (Oral)   Ht 5\' 7"  (1.702 m)   Wt 211 lb 9.6 oz (96 kg)   BMI 33.14 kg/m   Wt Readings from Last 3 Encounters:  12/30/16 211 lb 9.6 oz (96 kg)  12/23/16 210 lb 6.4 oz (95.4 kg)  12/14/16 211 lb (95.7 kg)    Physical Exam  Constitutional: He is oriented to person, place, and time. He appears well-developed and well-nourished. No distress.  HENT:  Right Ear: Tympanic membrane, external ear and ear canal normal.  Left Ear: Tympanic membrane, external ear and ear canal normal.  Nose: Mucosal edema and rhinorrhea present. No sinus tenderness. No epistaxis. Right sinus exhibits no maxillary sinus tenderness and no frontal sinus tenderness. Left sinus exhibits no maxillary sinus tenderness and no frontal sinus tenderness.  Mouth/Throat: Uvula is midline and mucous membranes are normal. Posterior oropharyngeal edema and posterior oropharyngeal erythema present. No oropharyngeal  exudate or tonsillar abscesses.  Eyes: Conjunctivae are normal. Right eye exhibits no discharge. Left eye exhibits no discharge. No scleral icterus.  Neck: Neck supple. No thyromegaly present.  Cardiovascular: Normal rate, regular rhythm, normal heart sounds and intact distal pulses.   No murmur heard. Pulmonary/Chest: Effort normal and breath sounds normal. No respiratory distress. He has no wheezes. He has no rales.  Musculoskeletal: Normal range of motion. He exhibits no edema.  Lymphadenopathy:    He has no cervical adenopathy.  Neurological: He is  alert and oriented to person, place, and time. Coordination normal.  Skin: Skin is warm and dry. No rash noted. He is not diaphoretic.  Psychiatric: He has a normal mood and affect. His behavior is normal.  Nursing note and vitals reviewed.     Assessment & Plan:   Problem List Items Addressed This Visit    None    Visit Diagnoses    Pharyngitis, unspecified etiology    -  Primary   Patient is having sore throat still despite 2 antibiotics, recommended antihistamine daily and ENT referral for possible nasal laryngoscopy, on omeprazole   Relevant Medications   predniSONE (DELTASONE) 20 MG tablet   Other Relevant Orders   Ambulatory referral to ENT       Follow up plan: Return if symptoms worsen or fail to improve.  Counseling provided for all of the vaccine components Orders Placed This Encounter  Procedures  . Ambulatory referral to ENT    Caryl Pina, MD [facility] 12/30/2016, 3:13 PM

## 2017-01-03 ENCOUNTER — Other Ambulatory Visit: Payer: Self-pay | Admitting: Family Medicine

## 2017-01-09 ENCOUNTER — Ambulatory Visit (INDEPENDENT_AMBULATORY_CARE_PROVIDER_SITE_OTHER): Payer: Medicaid Other | Admitting: Internal Medicine

## 2017-01-09 ENCOUNTER — Encounter (INDEPENDENT_AMBULATORY_CARE_PROVIDER_SITE_OTHER): Payer: Self-pay | Admitting: *Deleted

## 2017-01-09 ENCOUNTER — Encounter (INDEPENDENT_AMBULATORY_CARE_PROVIDER_SITE_OTHER): Payer: Self-pay | Admitting: Internal Medicine

## 2017-01-09 VITALS — BP 138/90 | HR 80 | Temp 98.4°F | Ht 66.0 in | Wt 210.9 lb

## 2017-01-09 DIAGNOSIS — R1310 Dysphagia, unspecified: Secondary | ICD-10-CM

## 2017-01-09 DIAGNOSIS — R1319 Other dysphagia: Secondary | ICD-10-CM

## 2017-01-09 NOTE — Progress Notes (Signed)
Subjective:    [name], male    DOB: [birth year], 66 y.o.   MRN: [id number]  HPI Referred by Dr. Whitney Muse for dysphagia. He says he having dysphagia. He has a funny feeling in his esophagus after eating. He says the Omeprazole has helped with his acid reflux.  He says he has a sorethroat and it is red. He says he is seeing an ENT tomorrow. Has been covered by his PCP with an antibiotic recently (Augmentin). He says his throat is sore today. He says he has had a sorethroat since Christmas.   Has troubling swallowing meats and bread. Has a BM daily. No melena or BRRB. He denies any fever.     Hx significant for prostate cancer and is followed at [facility]. Biochemical recurrence   11/19/2010 EGD/Colonoscopy:  Symptoms of GERD x 5 yrs. Diffculty swallowing liquids.  Rectal discomfort, intermittent hematochezia.  Family hx significant for a sister with colon cancer in her late 38s Findings: Erosive antral gastritis. No evidence of Barrett's. Few scattered diverticula at sigmoid and descending colon.  One diverticular changes of diverticulitis. Biopsy:  - TUBULAR ADENOMA. - HYPERPLASTIC POLYP(S). - HIGH GRADE DYSPLASIA IS NOT IDENTIFIED. Hx of H. pylori    Review of Systems Past Medical History:  Diagnosis Date  . Fatty liver   . Gynecomastia, male 01/11/2013   Secondary to prostate ca Tx.   . H/O Bell's palsy   . Hemorrhoids    history  . High cholesterol   . History of back injury 11/19/2013  . Hyperglycemia   . Hyperlipidemia   . Hypertension   . Prostate cancer ([place])   . Prostate cancer ([place]) 06/20/2011  . Prostate carcinoma, recurrent ([place]) 08/10/2012    Past Surgical History:  Procedure Laterality Date  . [place]   left  . COLONOSCOPY  2008  . ESOPHAGOGASTRODUODENOSCOPY    . HERNIA REPAIR  2010  . I&D EXTREMITY Left 06/10/2015   Procedure: IRRIGATION AND DEBRIDEMENT EXTREMITY LEFT HAND EXPLORATION, nerve repair;  Surgeon: Charlotte Crumb, MD;  Location: [facility];  Service: Orthopedics;  Laterality: Left;  . KNEE SURGERY     left  . PROSTATE BIOPSY  11/06    Allergies  Allergen Reactions  . Tylenol [Acetaminophen]     Told not to take this because of problems with the cancer medications.    Current Outpatient Prescriptions on File Prior to Visit  Medication Sig Dispense Refill  . amLODipine (NORVASC) 10 MG tablet Take 1 tablet (10 mg total) by mouth daily. 30 tablet 5  . bicalutamide (CASODEX) 50 MG tablet Take 1 tablet (50 mg total) by mouth daily. 30 tablet 5  . denosumab (PROLIA) 60 MG/ML SOLN injection Inject 60 mg into the skin every 6 (six) months. Administer in upper arm, thigh, or abdomen    . fluticasone (FLONASE) 50 MCG/ACT nasal spray Place 2 sprays into both nostrils daily. 16 g 6  . HYDROcodone-acetaminophen (NORCO/VICODIN) 5-325 MG tablet Take 1 tablet by mouth every 6 (six) hours as needed for moderate pain. 30 tablet 0  . Leuprolide Acetate ([name] IJ) Inject as directed every 3 (three) months.    . Melatonin 5 MG TABS Take 5 mg by mouth.    Marland Kitchen omeprazole (PRILOSEC) 20 MG capsule Take 1 capsule (20 mg total) by mouth daily. 30 capsule 3  . phenol (CHLORASEPTIC) 1.4 % LIQD Use as directed 1 spray in the mouth or throat as needed for throat  irritation / pain. 118 mL 0  . solifenacin (VESICARE) 10 MG tablet Take 1 tablet (10 mg total) by mouth daily. 30 tablet 6  . Vitamin D, Ergocalciferol, (DRISDOL) 50000 units CAPS capsule Take 1 capsule (50,000 Units total) by mouth once a week. 4 capsule 0  . predniSONE (DELTASONE) 20 MG tablet 2 po at same time daily for 5 days (Patient not taking: Reported on 01/09/2017) 10 tablet 0  . ranitidine (ZANTAC) 150 MG tablet Take 150 mg by mouth 2 (two) times daily.     No current facility-administered medications on file prior to visit.        Objective:   Physical Exam Blood pressure 138/90, pulse 80, temperature 98.4 F (36.9 C), height 5\' 6"  (1.676 m), weight  210 lb 14.4 oz (95.7 kg).  Alert and oriented. Skin warm and dry. Oral mucosa is moist.   . Sclera anicteric, conjunctivae is pink. Thyroid not enlarged. No cervical lymphadenopathy. Lungs clear. Heart regular rate and rhythm.  Abdomen is soft. Bowel sounds are positive. No hepatomegaly. No abdominal masses felt. No tenderness.  No edema to lower extremities.        Assessment & Plan:  GERD/Dysphagia. Am going to get a DG esophagram to see if he has a stricture or web.  Family hx of colon cancer in a sister. Will need a colonoscopy once I have swallow test back.

## 2017-01-09 NOTE — Patient Instructions (Addendum)
DG esophagram.  Will need a colonoscopy once I have this report back.

## 2017-01-11 ENCOUNTER — Encounter (HOSPITAL_COMMUNITY): Payer: Medicaid Other

## 2017-01-11 ENCOUNTER — Encounter (HOSPITAL_BASED_OUTPATIENT_CLINIC_OR_DEPARTMENT_OTHER): Payer: Medicaid Other

## 2017-01-11 ENCOUNTER — Encounter: Payer: Self-pay | Admitting: Internal Medicine

## 2017-01-11 VITALS — BP 126/73 | HR 94 | Temp 98.8°F | Resp 18

## 2017-01-11 DIAGNOSIS — C61 Malignant neoplasm of prostate: Secondary | ICD-10-CM

## 2017-01-11 DIAGNOSIS — M8580 Other specified disorders of bone density and structure, unspecified site: Secondary | ICD-10-CM

## 2017-01-11 LAB — CBC WITH DIFFERENTIAL/PLATELET
BASOS ABS: 0.1 10*3/uL (ref 0.0–0.1)
BASOS PCT: 1 %
Eosinophils Absolute: 0.5 10*3/uL (ref 0.0–0.7)
Eosinophils Relative: 4 %
HEMATOCRIT: 38.9 % — AB (ref 39.0–52.0)
HEMOGLOBIN: 13.4 g/dL (ref 13.0–17.0)
Lymphocytes Relative: 23 %
Lymphs Abs: 2.6 10*3/uL (ref 0.7–4.0)
MCH: 31.5 pg (ref 26.0–34.0)
MCHC: 34.4 g/dL (ref 30.0–36.0)
MCV: 91.3 fL (ref 78.0–100.0)
Monocytes Absolute: 0.8 10*3/uL (ref 0.1–1.0)
Monocytes Relative: 7 %
NEUTROS ABS: 7.6 10*3/uL (ref 1.7–7.7)
NEUTROS PCT: 65 %
Platelets: 329 10*3/uL (ref 150–400)
RBC: 4.26 MIL/uL (ref 4.22–5.81)
RDW: 12.9 % (ref 11.5–15.5)
WBC: 11.6 10*3/uL — ABNORMAL HIGH (ref 4.0–10.5)

## 2017-01-11 LAB — COMPREHENSIVE METABOLIC PANEL
ALBUMIN: 4.1 g/dL (ref 3.5–5.0)
ALK PHOS: 41 U/L (ref 38–126)
ALT: 20 U/L (ref 17–63)
ANION GAP: 9 (ref 5–15)
AST: 20 U/L (ref 15–41)
BUN: 16 mg/dL (ref 6–20)
CHLORIDE: 102 mmol/L (ref 101–111)
CO2: 27 mmol/L (ref 22–32)
Calcium: 9.3 mg/dL (ref 8.9–10.3)
Creatinine, Ser: 0.93 mg/dL (ref 0.61–1.24)
GFR calc non Af Amer: >60 mL/min (ref >60)
GLUCOSE: 122 mg/dL — AB (ref 65–99)
Potassium: 3.4 mmol/L — ABNORMAL LOW (ref 3.5–5.1)
SODIUM: 138 mmol/L (ref 135–145)
Total Bilirubin: 0.7 mg/dL (ref 0.3–1.2)
Total Protein: 7.4 g/dL (ref 6.5–8.1)

## 2017-01-11 LAB — PSA: PSA: 1.98 ng/mL (ref 0.00–4.00)

## 2017-01-11 MED ORDER — DENOSUMAB 60 MG/ML ~~LOC~~ SOLN
60.0000 mg | Freq: Once | SUBCUTANEOUS | Status: AC
  Administered 2017-01-11: 60 mg via SUBCUTANEOUS
  Filled 2017-01-11: qty 1

## 2017-01-11 MED ORDER — SODIUM CHLORIDE 0.9 % IV SOLN: Freq: Once | INTRAVENOUS | Status: DC

## 2017-01-11 NOTE — Progress Notes (Signed)
Walter Boone presents today for injection per MD orders. Prolia 60 mg administered SQ in left Upper Arm. Administration without incident. Patient tolerated well.

## 2017-01-11 NOTE — Patient Instructions (Signed)
[facility] at [facility] Discharge Instructions  RECOMMENDATIONS MADE BY THE CONSULTANT AND ANY TEST RESULTS WILL BE SENT TO YOUR REFERRING PHYSICIAN.  Prolia 60 mg injection given as ordered. Return as scheduled.  Thank you for choosing [facility] at [facility] to provide your oncology and hematology care.  To afford each patient quality time with our provider, please arrive at least 15 minutes before your scheduled appointment time.    If you have a lab appointment with the Cancer Center please come in thru the  Main Entrance and check in at the main information desk  You need to re-schedule your appointment should you arrive 10 or more minutes late.  We strive to give you quality time with our providers, and arriving late affects you and other patients whose appointments are after yours.  Also, if you no show three or more times for appointments you may be dismissed from the clinic at the providers discretion.     Again, thank you for choosing [facility].  Our hope is that these requests will decrease the amount of time that you wait before being seen by our physicians.       _____________________________________________________________  Should you have questions after your visit to  Cancer Center, please contact our office at [phone] between the hours of 8:30 a.m. and 4:30 p.m.  Voicemails left after 4:30 p.m. will not be returned until the following business day.  For prescription refill requests, have your pharmacy contact our office.       Resources For Cancer Patients and their Caregivers ? American Cancer Society: Can assist with transportation, wigs, general needs, runs Look Good Feel Better.        1-888-227-6333 ? Cancer Care: Provides financial assistance, online support groups, medication/co-pay assistance.  1-800-813-HOPE (4673) ? [facility] Assists [place] cancer  patients and their families through emotional , educational and financial support.  336-427-4357 ? [place] DSS Where to apply for food stamps, Medicaid and utility assistance. 336-342-1394 ? [facility]: Transportation to medical appointments. 336-347-2287 ? Social Security Administration: May apply for disability if have a Stage IV cancer. 336-342-7796 1-800-772-1213 ? [place] Aging, Disability and Transit Services: Assists with nutrition, care and transit needs. 336-349-2343  Cancer Center Support Programs: @[email or web address]@ > Cancer Support Group  2nd Tuesday of the month 1pm-2pm, Journey Room  > Creative Journey  3rd Tuesday of the month 1130am-1pm, Journey Room  > Look Good Feel Better  1st Wednesday of the month 10am-12 noon, Journey Room (Call American Cancer Society to register 1-800-395-5775)   

## 2017-01-12 ENCOUNTER — Ambulatory Visit (HOSPITAL_COMMUNITY)
Admission: RE | Admit: 2017-01-12 | Discharge: 2017-01-12 | Disposition: A | Discharge status: Home / Self Care | Payer: Medicaid Other | Source: Ambulatory Visit | Attending: Internal Medicine | Admitting: Internal Medicine

## 2017-01-12 DIAGNOSIS — K224 Dyskinesia of esophagus: Secondary | ICD-10-CM | POA: Diagnosis not present

## 2017-01-12 DIAGNOSIS — K219 Gastro-esophageal reflux disease without esophagitis: Secondary | ICD-10-CM | POA: Diagnosis present

## 2017-01-12 DIAGNOSIS — R1319 Other dysphagia: Secondary | ICD-10-CM

## 2017-01-12 DIAGNOSIS — R1310 Dysphagia, unspecified: Secondary | ICD-10-CM | POA: Insufficient documentation

## 2017-02-06 ENCOUNTER — Other Ambulatory Visit: Payer: Self-pay | Admitting: Family Medicine

## 2017-02-06 DIAGNOSIS — C61 Malignant neoplasm of prostate: Secondary | ICD-10-CM

## 2017-02-08 ENCOUNTER — Encounter: Payer: Self-pay | Admitting: Family Medicine

## 2017-02-08 ENCOUNTER — Ambulatory Visit (INDEPENDENT_AMBULATORY_CARE_PROVIDER_SITE_OTHER): Payer: Medicaid Other | Admitting: Family Medicine

## 2017-02-08 VITALS — BP 121/85 | HR 79 | Temp 97.3°F | Ht 67.0 in | Wt 215.4 lb

## 2017-02-08 DIAGNOSIS — M19041 Primary osteoarthritis, right hand: Secondary | ICD-10-CM | POA: Diagnosis not present

## 2017-02-08 DIAGNOSIS — C61 Malignant neoplasm of prostate: Secondary | ICD-10-CM | POA: Diagnosis not present

## 2017-02-08 MED ORDER — HYDROCODONE-ACETAMINOPHEN 5-325 MG PO TABS: 1.0000 | ORAL_TABLET | Freq: Four times a day (QID) | ORAL | 0 refills | Status: DC | PRN

## 2017-02-08 MED ORDER — DICLOFENAC SODIUM 1 % TD GEL
2.0000 g | Freq: Four times a day (QID) | TRANSDERMAL | 2 refills | Status: DC
Start: 2017-02-08 — End: 2017-04-11

## 2017-02-08 MED ORDER — PREDNISONE 20 MG PO TABS: ORAL_TABLET | ORAL | 0 refills | Status: DC

## 2017-02-08 NOTE — Progress Notes (Signed)
BP 121/85   Pulse 79   Temp 97.3 F (36.3 C) (Oral)   Ht 5\' 7"  (1.702 m)   Wt 215 lb 6 oz (97.7 kg)   BMI 33.73 kg/m    Subjective:    [name], male    DOB: [birth year], 66 y.o.   MRN: [id number]  HPI: Walter Boone is a 66 y.o. male presenting on 02/08/2017 for Right hand pain (saw rheumatologist, wants to discuss visit)   HPI Right hand and joint pain and swelling Patient has been having continued right hand and joint pain especially in his second and third MCP joints. He went and saw a rheumatologist who did extensive testing and they did not find anything that would necessarily signify a cause for this issue. They said he likely has osteoarthritis. He denies any fevers or chills or redness or warmth was still has significant amount of swelling in the hand. He denies any loss of range of motion. The pain hurts him most all the time and he has taken Tylenol for it occasionally.  Relevant past medical, surgical, family and social history reviewed and updated as indicated. Interim medical history since our last visit reviewed. Allergies and medications reviewed and updated.  Review of Systems  Constitutional: Negative for chills and fever.  Respiratory: Negative for shortness of breath and wheezing.   Cardiovascular: Negative for chest pain and leg swelling.  Musculoskeletal: Positive for arthralgias and joint swelling. Negative for back pain and gait problem.  Skin: Negative for color change and rash.  All other systems reviewed and are negative.   Per HPI unless specifically indicated above     Objective:    BP 121/85   Pulse 79   Temp 97.3 F (36.3 C) (Oral)   Ht 5\' 7"  (1.702 m)   Wt 215 lb 6 oz (97.7 kg)   BMI 33.73 kg/m   Wt Readings from Last 3 Encounters:  02/08/17 215 lb 6 oz (97.7 kg)  01/09/17 210 lb 14.4 oz (95.7 kg)  12/30/16 211 lb 9.6 oz (96 kg)    Physical Exam  Constitutional: He is oriented to person, place, and time. He  appears well-developed and well-nourished. No distress.  Eyes: Conjunctivae are normal. No scleral icterus.  Cardiovascular: Normal rate, regular rhythm, normal heart sounds and intact distal pulses.   No murmur heard. Pulmonary/Chest: Effort normal and breath sounds normal. No respiratory distress. He has no wheezes. He has no rales.  Musculoskeletal: Normal range of motion. He exhibits tenderness. He exhibits no edema.       Right hand: He exhibits tenderness and swelling (Swelling and pain surrounding the second and third MCP joints, no erythema or warmth or loss of range of motion or loss of sensation. Capillary refill normal). He exhibits normal capillary refill and no deformity. Normal sensation noted. Normal strength noted.  Neurological: He is alert and oriented to person, place, and time. Coordination normal.  Skin: Skin is warm and dry. No rash noted. He is not diaphoretic.  Psychiatric: He has a normal mood and affect. His behavior is normal.  Nursing note and vitals reviewed.      Assessment & Plan:   Problem List Items Addressed This Visit      Genitourinary   Prostate cancer ([place])   Relevant Medications   HYDROcodone-acetaminophen (NORCO/VICODIN) 5-325 MG tablet   predniSONE (DELTASONE) 20 MG tablet    Other Visit Diagnoses    Osteoarthritis, hand, primary localized, right    -  Primary   Relevant Medications   HYDROcodone-acetaminophen (NORCO/VICODIN) 5-325 MG tablet   diclofenac sodium (VOLTAREN) 1 % GEL   predniSONE (DELTASONE) 20 MG tablet       Follow up plan: Return if symptoms worsen or fail to improve.  Counseling provided for all of the vaccine components No orders of the defined types were placed in this encounter.   Caryl Pina, MD [facility] 02/08/2017, 10:54 AM

## 2017-03-17 ENCOUNTER — Other Ambulatory Visit (HOSPITAL_COMMUNITY): Payer: Self-pay | Admitting: *Deleted

## 2017-03-17 DIAGNOSIS — C61 Malignant neoplasm of prostate: Secondary | ICD-10-CM

## 2017-03-20 ENCOUNTER — Ambulatory Visit (HOSPITAL_COMMUNITY): Payer: Self-pay

## 2017-03-20 ENCOUNTER — Encounter (HOSPITAL_COMMUNITY): Payer: Medicaid Other

## 2017-03-20 ENCOUNTER — Encounter (HOSPITAL_BASED_OUTPATIENT_CLINIC_OR_DEPARTMENT_OTHER): Payer: Medicaid Other

## 2017-03-20 ENCOUNTER — Encounter (HOSPITAL_COMMUNITY): Payer: Medicaid Other | Attending: Hematology & Oncology | Admitting: Oncology

## 2017-03-20 VITALS — BP 112/96 | HR 85 | Temp 97.4°F | Resp 16 | Wt 216.1 lb

## 2017-03-20 DIAGNOSIS — M8580 Other specified disorders of bone density and structure, unspecified site: Secondary | ICD-10-CM | POA: Diagnosis not present

## 2017-03-20 DIAGNOSIS — Z5111 Encounter for antineoplastic chemotherapy: Secondary | ICD-10-CM

## 2017-03-20 DIAGNOSIS — C61 Malignant neoplasm of prostate: Secondary | ICD-10-CM

## 2017-03-20 DIAGNOSIS — M1990 Unspecified osteoarthritis, unspecified site: Secondary | ICD-10-CM

## 2017-03-20 LAB — COMPREHENSIVE METABOLIC PANEL
ALK PHOS: 40 U/L (ref 38–126)
ALT: 19 U/L (ref 17–63)
ANION GAP: 7 (ref 5–15)
AST: 21 U/L (ref 15–41)
Albumin: 4.2 g/dL (ref 3.5–5.0)
BUN: 22 mg/dL — AB (ref 6–20)
CO2: 25 mmol/L (ref 22–32)
Calcium: 9.1 mg/dL (ref 8.9–10.3)
Chloride: 107 mmol/L (ref 101–111)
Creatinine, Ser: 0.7 mg/dL (ref 0.61–1.24)
GFR calc Af Amer: >60 mL/min (ref >60)
GFR calc non Af Amer: >60 mL/min (ref >60)
GLUCOSE: 99 mg/dL (ref 65–99)
Potassium: 3.6 mmol/L (ref 3.5–5.1)
SODIUM: 139 mmol/L (ref 135–145)
Total Bilirubin: 0.4 mg/dL (ref 0.3–1.2)
Total Protein: 7.3 g/dL (ref 6.5–8.1)

## 2017-03-20 LAB — CBC WITH DIFFERENTIAL/PLATELET
BASOS ABS: 0.1 10*3/uL (ref 0.0–0.1)
BASOS PCT: 1 %
EOS ABS: 0.3 10*3/uL (ref 0.0–0.7)
Eosinophils Relative: 4 %
HEMATOCRIT: 39.1 % (ref 39.0–52.0)
HEMOGLOBIN: 13.4 g/dL (ref 13.0–17.0)
LYMPHS PCT: 33 %
Lymphs Abs: 2.6 10*3/uL (ref 0.7–4.0)
MCH: 31.3 pg (ref 26.0–34.0)
MCHC: 34.3 g/dL (ref 30.0–36.0)
MCV: 91.4 fL (ref 78.0–100.0)
MONOS PCT: 8 %
Monocytes Absolute: 0.6 10*3/uL (ref 0.1–1.0)
NEUTROS ABS: 4.2 10*3/uL (ref 1.7–7.7)
NEUTROS PCT: 54 %
Platelets: 373 10*3/uL (ref 150–400)
RBC: 4.28 MIL/uL (ref 4.22–5.81)
RDW: 12.8 % (ref 11.5–15.5)
WBC: 7.7 10*3/uL (ref 4.0–10.5)

## 2017-03-20 LAB — PSA: PSA: 1.77 ng/mL (ref 0.00–4.00)

## 2017-03-20 MED ORDER — NAPROXEN 500 MG PO TABS: 500.0000 mg | ORAL_TABLET | Freq: Two times a day (BID) | ORAL | 0 refills | Status: DC

## 2017-03-20 MED ORDER — PREDNISONE 20 MG PO TABS: 20.0000 mg | ORAL_TABLET | Freq: Every day | ORAL | 0 refills | Status: DC

## 2017-03-20 MED ORDER — LEUPROLIDE ACETATE (3 MONTH) 22.5 MG IM KIT
22.5000 mg | PACK | Freq: Once | INTRAMUSCULAR | Status: AC
  Administered 2017-03-20: 22.5 mg via INTRAMUSCULAR
  Filled 2017-03-20: qty 22.5

## 2017-03-20 NOTE — Progress Notes (Signed)
Worthy Rancher, MD [place] [address]  Prostate cancer [facility])  Osteopenia determined by x-ray  Arthritis - Plan: predniSONE (DELTASONE) 20 MG tablet, naproxen (NAPROSYN) 500 MG tablet  CURRENT THERAPY: Depo-[name] every 3 months, Casodex daily.  Prolia every 6 months.  INTERVAL HISTORY: Walter Boone 66 y.o. male returns for followup of Stage IIC prostate cancer, S/P definitive XRT due to biochemical failure in 2012, started on [name] at that time. Casodex added in 2015.  More recently, PSA noted to climb and therefore casodex was discontinued in June 2017 in an attempt to drive PSA back down.  HOWEVER, his PSA continued to climb and doubled over 4 months.  Therefore, Casodex was re-instituted on 09/12/2016 in order to re-challenge for a response. Testosterone levels are in castrated range.  Depo-[name] every 3 months is continued.   From an oncology perspective, he is doing well.  He denies any new pain other than his arthritis.  He has been evaluated by rheumatology for his arthritis without any known rheumatologic issues to explain his symptoms.  He reports he has been referred to orthopedics.  He notes today that he cannot afford his cancer medication.  He is trying to get Medicaid.  I will ask our financial advisor to discuss this issue with the patient.  His biggest issue is his metacarpal inflammation/arthritis.  He notes that it inhibits his range of motion of fingers and is causing him a significant amount of pain.  Exam is impressive and described below.  He reports that prednisone/anti-inflammatory treatment has been effective in the past.  He is not currently taking anything he reports due to insurance issues.  He reports a positive response to Prednisone pulse in the recent past.  This was last given in Feb 2018 and I agree that long-term use could be detrimental.  Review of Systems  Constitutional: Negative.  Negative for chills, fever,  malaise/fatigue and weight loss.  HENT: Negative.   Eyes: Negative.   Respiratory: Negative.  Negative for cough.   Cardiovascular: Negative.  Negative for chest pain.  Gastrointestinal: Negative.  Negative for blood in stool, constipation, diarrhea, melena, nausea and vomiting.  Genitourinary: Negative.   Musculoskeletal: Positive for joint pain (Metacarpals.).  Skin: Negative.   Neurological: Negative.  Negative for weakness.  Endo/Heme/Allergies: Negative.   Psychiatric/Behavioral: Negative.     Past Medical History:  Diagnosis Date  . Fatty liver   . Gynecomastia, male 01/11/2013   Secondary to prostate ca Tx.   . H/O Bell's palsy   . Hemorrhoids    history  . High cholesterol   . History of back injury 11/19/2013  . Hyperglycemia   . Hyperlipidemia   . Hypertension   . Prostate cancer ([place])   . Prostate cancer ([place]) 06/20/2011  . Prostate carcinoma, recurrent ([place]) 08/10/2012    Past Surgical History:  Procedure Laterality Date  . [place]   left  . COLONOSCOPY  2008  . ESOPHAGOGASTRODUODENOSCOPY    . HERNIA REPAIR  2010  . I&D EXTREMITY Left 06/10/2015   Procedure: IRRIGATION AND DEBRIDEMENT EXTREMITY LEFT HAND EXPLORATION, nerve repair;  Surgeon: Charlotte Crumb, MD;  Location: [facility];  Service: Orthopedics;  Laterality: Left;  . KNEE SURGERY     left  . PROSTATE BIOPSY  11/06    Family History  Problem Relation Age of Onset  . Heart failure Mother   . Diabetes Father   . Cancer  Sister   . Diabetes Brother     Social History   Social History  . Marital status: Married    Spouse name: N/A  . Number of children: N/A  . Years of education: N/A   Social History Main Topics  . Smoking status: Former Smoker    Packs/day: 2.50    Years: 3.00  . Smokeless tobacco: Never Used  . Alcohol use No     Comment: former drinker 20 years ago  . Drug use: No  . Sexual activity: Not on file   Other Topics Concern  . Not on file   Social History  Narrative  . No narrative on file     PHYSICAL EXAMINATION  ECOG PERFORMANCE STATUS: 0 - Asymptomatic  Vitals:   03/20/17 1352 03/20/17 1422  BP: (!) 112/96 (!) 112/96  Pulse: 81 85  Resp: 16 16  Temp: 97.4 F (36.3 C) 97.4 F (36.3 C)    GENERAL:alert, no distress, well nourished, well developed, comfortable, cooperative, smiling and unaccompanied SKIN: skin color, texture, turgor are normal, no rashes or significant lesions HEAD: Normocephalic, No masses, lesions, tenderness or abnormalities EYES: normal, EOMI, Conjunctiva are pink and non-injected EARS: External ears normal OROPHARYNX:lips, buccal mucosa, and tongue normal and mucous membranes are moist  NECK: supple, no adenopathy, trachea midline LYMPH:  no palpable lymphadenopathy BREAST:not examined LUNGS: clear to auscultation and percussion HEART: regular rate & rhythm, no murmurs, no gallops, S1 normal and S2 normal ABDOMEN:abdomen soft, non-tender and normal bowel sounds BACK: Back symmetric, no curvature. EXTREMITIES:less then 2 second capillary refill, no skin discoloration, no cyanosis, positive findings:  Inflammation of #1, #2, and #3 metacarpophalangeal joints with erythema, edema, and heat to palpation. NEURO: alert & oriented x 3 with fluent speech, no focal motor/sensory deficits, gait normal   LABORATORY DATA: CBC    Component Value Date/Time   WBC 7.7 03/20/2017 1238   RBC 4.28 03/20/2017 1238   HGB 13.4 03/20/2017 1238   HCT 39.1 03/20/2017 1238   HCT 36.1 (L) 05/19/2016 1623   PLT 373 03/20/2017 1238   PLT 369 05/19/2016 1623   MCV 91.4 03/20/2017 1238   MCV 90 05/19/2016 1623   MCH 31.3 03/20/2017 1238   MCHC 34.3 03/20/2017 1238   RDW 12.8 03/20/2017 1238   RDW 13.3 05/19/2016 1623   LYMPHSABS 2.6 03/20/2017 1238   LYMPHSABS 2.3 05/19/2016 1623   MONOABS 0.6 03/20/2017 1238   EOSABS 0.3 03/20/2017 1238   EOSABS 0.3 05/19/2016 1623   BASOSABS 0.1 03/20/2017 1238   BASOSABS 0.0  05/19/2016 1623      Chemistry      Component Value Date/Time   NA 139 03/20/2017 1238   NA 140 11/11/2015 1547   K 3.6 03/20/2017 1238   CL 107 03/20/2017 1238   CO2 25 03/20/2017 1238   BUN 22 (H) 03/20/2017 1238   BUN 23 11/11/2015 1547   CREATININE 0.70 03/20/2017 1238   GLU 97 04/22/2016      Component Value Date/Time   CALCIUM 9.1 03/20/2017 1238   ALKPHOS 40 03/20/2017 1238   AST 21 03/20/2017 1238   ALT 19 03/20/2017 1238   BILITOT 0.4 03/20/2017 1238   BILITOT <0.2 11/11/2015 1547        PENDING LABS:   RADIOGRAPHIC STUDIES:  No results found.   PATHOLOGY:    ASSESSMENT AND PLAN:  Prostate cancer ([place]) Stage IIC prostate cancer, S/P definitive XRT due to biochemical failure in 2012, started on [name]  at that time. Casodex added in 2015.  More recently, PSA noted to climb and therefore casodex was discontinued in June 2017 in an attempt to drive PSA back down.  HOWEVER, his PSA continued to climb and doubled over 4 months.  Therefore, Casodex was re-instituted on 09/12/2016 in order to re-challenge for a response. Testosterone levels are in castrated range.  Depo-[name] every 3 months is continued.  Oncology history updated.  Labs today: CBC diff, CMET, PSA.  I personally reviewed and went over laboratory results with the patient.  The results are noted within this dictation.  PSA is pending at this time.  Otherwise, labs are stable.  Labs in 3 months: CBC diff, CMET, PSA.  If PSA fails to respond/rises, then a change in therapy to [facility] versus Zytiga+Prednisone may be needed.  He reports cost issue with Casodex.  He is trying to get medicaid re-instated but he has to provide $900 worth of receipts which he is collecting at this time to submit for approval.  In the interim, I have consulted Lendell Caprice to see if there is assistance that can be provided.  He has significant arthritis of his metacarpophalangeal joints of #1, #2, and #3 bilaterally.  I have  written the following Rxs:  Prednisone 20 mg daily x 5 days  Naproxen 500 mg. This has been worked up by rheumatology which was negative for any rheumatologic issues.  He has been referred to orthopod.  Problem list reviewed with patient and edited accordingly.  Medications are reviewed with the patient and edited accordingly.  Return in 3 months for follow-up.  More than 50% of the time spent with the patient was utilized for counseling and coordination of care.  Osteopenia determined by x-ray He will continue on prolia, calcium and vitamin D. Next bone density is due in 12/2017.   Oncology Flowsheet 01/11/2017  denosumab (PROLIA)  60 mg    Next Prolia injection is due in June/July 2018.   ORDERS PLACED FOR THIS ENCOUNTER: No orders of the defined types were placed in this encounter.   MEDICATIONS PRESCRIBED THIS ENCOUNTER: Meds ordered this encounter  Medications  . predniSONE (DELTASONE) 20 MG tablet    Sig: Take 1 tablet (20 mg total) by mouth daily with breakfast.    Dispense:  5 tablet    Refill:  0    Future refills from PCP, if indicated.    Order Specific Question:   Supervising Provider    Answer:   Brunetta Genera [[id number]]  . naproxen (NAPROSYN) 500 MG tablet    Sig: Take 1 tablet (500 mg total) by mouth 2 (two) times daily with a meal.    Dispense:  60 tablet    Refill:  0    Future refills from PCP, if indicated    Order Specific Question:   Supervising Provider    Answer:   Brunetta Genera [[id number]]    THERAPY PLAN:  Continue [name] every 3 months, Casodex daily, and Prolia every 6 months.  All questions were answered. The patient knows to call the clinic with any problems, questions or concerns. We can certainly see the patient much sooner if necessary.  Patient and plan discussed with Dr. Twana First and she is in agreement with the aforementioned.   This note is electronically signed by: Doy Mince 03/20/2017 2:57 PM

## 2017-03-20 NOTE — Patient Instructions (Signed)
Walter Boone at [facility] Discharge Instructions  RECOMMENDATIONS MADE BY THE CONSULTANT AND ANY TEST RESULTS WILL BE SENT TO YOUR REFERRING PHYSICIAN.  [name] 22.5 mg injection given as ordered.  Thank you for choosing [place] at [facility] to provide your oncology and hematology care.  To afford each patient quality time with our provider, please arrive at least 15 minutes before your scheduled appointment time.    If you have a lab appointment with the [facility] please come in thru the  Main Entrance and check in at the main information desk  You need to re-schedule your appointment should you arrive 10 or more minutes late.  We strive to give you quality time with our providers, and arriving late affects you and other patients whose appointments are after yours.  Also, if you no show three or more times for appointments you may be dismissed from the clinic at the providers discretion.     Again, thank you for choosing [facility].  Our hope is that these requests will decrease the amount of time that you wait before being seen by our physicians.       _____________________________________________________________  Should you have questions after your visit to [facility], please contact our office at [phone] between the hours of 8:30 a.m. and 4:30 p.m.  Voicemails left after 4:30 p.m. will not be returned until the following business day.  For prescription refill requests, have your pharmacy contact our office.       Resources For Cancer Patients and their Caregivers ? American Cancer Society: Can assist with transportation, wigs, general needs, runs Look Good Feel Better.        (346)375-9534 ? Cancer Care: Provides financial assistance, online support groups, medication/co-pay assistance.  1-800-813-HOPE (781)726-0191) ? [facility] Assists [place] cancer patients and their  families through emotional , educational and financial support.  (831) 653-5536 ? [place] DSS Where to apply for food stamps, Medicaid and utility assistance. (304) 408-9510 ? [employer]: Transportation to medical appointments. 416-590-1449 ? Social Security Administration: May apply for disability if have a Stage IV cancer. (801)005-8626 440-108-1872 ? [employer], Disability and Transit Services: Assists with nutrition, care and transit needs. [place] Support Programs: @10RELATIVEDAYS @ > Cancer Support Group  2nd Tuesday of the month 1pm-2pm, Journey Room  > Creative Journey  3rd Tuesday of the month 1130am-1pm, Journey Room  > Look Good Feel Better  1st Wednesday of the month 10am-12 noon, Journey Room (Call [place] to register [phone])

## 2017-03-20 NOTE — Patient Instructions (Addendum)
[place] at [facility] Discharge Instructions  RECOMMENDATIONS MADE BY THE CONSULTANT AND ANY TEST RESULTS WILL BE SENT TO YOUR REFERRING PHYSICIAN. You were seen today by Kirby Crigler PA-C. [name] injection today and every 3 months. Prolia in June/July and then every 6 months. Rx given today for Prednisone and Naproxen. Return in 65months for labs and follow up.    Thank you for choosing [place] at [facility] to provide your oncology and hematology care.  To afford each patient quality time with our provider, please arrive at least 15 minutes before your scheduled appointment time.    If you have a lab appointment with the [facility] please come in thru the  Main Entrance and check in at the main information desk  You need to re-schedule your appointment should you arrive 10 or more minutes late.  We strive to give you quality time with our providers, and arriving late affects you and other patients whose appointments are after yours.  Also, if you no show three or more times for appointments you may be dismissed from the clinic at the providers discretion.     Again, thank you for choosing [facility].  Our hope is that these requests will decrease the amount of time that you wait before being seen by our physicians.       _____________________________________________________________  Should you have questions after your visit to [facility], please contact our office at [phone] between the hours of 8:30 a.m. and 4:30 p.m.  Voicemails left after 4:30 p.m. will not be returned until the following business day.  For prescription refill requests, have your pharmacy contact our office.       Resources For Cancer Patients and their Caregivers ? American Cancer Society: Can assist with transportation, wigs, general needs, runs Look Good Feel Better.        314 241 1390 ? Cancer Care: Provides  financial assistance, online support groups, medication/co-pay assistance.  1-800-813-HOPE 765-855-3198) ? [facility] Assists [place] cancer patients and their families through emotional , educational and financial support.  630-036-6922 ? [place] DSS Where to apply for food stamps, Medicaid and utility assistance. 641-845-3156 ? [employer]: Transportation to medical appointments. 8076102440 ? Social Security Administration: May apply for disability if have a Stage IV cancer. 3183510303 (956)014-5679 ? [employer], Disability and Transit Services: Assists with nutrition, care and transit needs. [place] Support Programs: @10RELATIVEDAYS @ > Cancer Support Group  2nd Tuesday of the month 1pm-2pm, Journey Room  > Creative Journey  3rd Tuesday of the month 1130am-1pm, Journey Room  > Look Good Feel Better  1st Wednesday of the month 10am-12 noon, Journey Room (Call [place] to register [phone])

## 2017-03-20 NOTE — Assessment & Plan Note (Addendum)
Stage IIC prostate cancer, S/P definitive XRT due to biochemical failure in 2012, started on [name] at that time. Casodex added in 2015.  More recently, PSA noted to climb and therefore casodex was discontinued in June 2017 in an attempt to drive PSA back down.  HOWEVER, his PSA continued to climb and doubled over 4 months.  Therefore, Casodex was re-instituted on 09/12/2016 in order to re-challenge for a response. Testosterone levels are in castrated range.  Depo-[name] every 3 months is continued.  Oncology history updated.  Labs today: CBC diff, CMET, PSA.  I personally reviewed and went over laboratory results with the patient.  The results are noted within this dictation.  PSA is pending at this time.  Otherwise, labs are stable.  Labs in 3 months: CBC diff, CMET, PSA.  If PSA fails to respond/rises, then a change in therapy to [facility] versus Zytiga+Prednisone may be needed.  He reports cost issue with Casodex.  He is trying to get medicaid re-instated but he has to provide $900 worth of receipts which he is collecting at this time to submit for approval.  In the interim, I have consulted Lendell Caprice to see if there is assistance that can be provided.  He has significant arthritis of his metacarpophalangeal joints of #1, #2, and #3 bilaterally.  I have written the following Rxs:  Prednisone 20 mg daily x 5 days  Naproxen 500 mg. This has been worked up by rheumatology which was negative for any rheumatologic issues.  He has been referred to orthopod.  Problem list reviewed with patient and edited accordingly.  Medications are reviewed with the patient and edited accordingly.  Return in 3 months for follow-up.  More than 50% of the time spent with the patient was utilized for counseling and coordination of care.

## 2017-03-20 NOTE — Assessment & Plan Note (Signed)
He will continue on prolia, calcium and vitamin D. Next bone density is due in 12/2017.   Oncology Flowsheet 01/11/2017  denosumab (PROLIA) [place] 60 mg    Next Prolia injection is due in June/July 2018.

## 2017-03-20 NOTE — Progress Notes (Signed)
Walter Boone presents today for injection per MD orders. [name] 22.5 mg administered IM in right upper outer Gluteal. Administration without incident. Patient tolerated well.

## 2017-03-29 ENCOUNTER — Telehealth (HOSPITAL_COMMUNITY): Payer: Self-pay | Admitting: Oncology

## 2017-03-29 NOTE — Telephone Encounter (Signed)
FAXED MED BILLS TO [facility] @ [phone]

## 2017-04-11 ENCOUNTER — Ambulatory Visit (INDEPENDENT_AMBULATORY_CARE_PROVIDER_SITE_OTHER): Payer: Medicaid Other | Admitting: Family Medicine

## 2017-04-11 ENCOUNTER — Encounter: Payer: Self-pay | Admitting: Family Medicine

## 2017-04-11 VITALS — BP 119/79 | HR 79 | Temp 97.6°F | Ht 67.0 in | Wt 211.0 lb

## 2017-04-11 DIAGNOSIS — M1990 Unspecified osteoarthritis, unspecified site: Secondary | ICD-10-CM

## 2017-04-11 DIAGNOSIS — M5431 Sciatica, right side: Secondary | ICD-10-CM | POA: Diagnosis not present

## 2017-04-11 MED ORDER — PREDNISONE 20 MG PO TABS: 40.0000 mg | ORAL_TABLET | Freq: Every day | ORAL | 0 refills | Status: DC

## 2017-04-11 MED ORDER — AMLODIPINE BESYLATE 10 MG PO TABS: 10.0000 mg | ORAL_TABLET | Freq: Every day | ORAL | 5 refills | Status: DC

## 2017-04-11 MED ORDER — HYDROCODONE-ACETAMINOPHEN 5-325 MG PO TABS
1.0000 | ORAL_TABLET | Freq: Four times a day (QID) | ORAL | 0 refills | Status: DC | PRN
Start: 2017-04-11 — End: 2017-08-09

## 2017-04-11 MED ORDER — BICALUTAMIDE 50 MG PO TABS: 50.0000 mg | ORAL_TABLET | Freq: Every day | ORAL | 5 refills | Status: DC

## 2017-04-11 NOTE — Progress Notes (Signed)
BP 119/79 (BP Location: Left Arm)   Pulse 79   Temp 97.6 F (36.4 C) (Oral)   Ht 5\' 7"  (1.702 m)   Wt 211 lb (95.7 kg)   BMI 33.05 kg/m    Subjective:    [name], male    DOB: [birth year], 66 y.o.   MRN: [id number]  HPI: Walter Boone is a 66 y.o. male presenting on 04/11/2017 for right hip pain (going down right leg)   HPI  Right hip and back pain Patient has right hip pain is going down the back of his right leg. The pain has been increasing over the past week. This is a pain that he has had previously. He denies any fevers or chills or numbness or weakness. He did run out of his pain medications and would like a refill for those. The pain runs down the lateral side of his hip down to his knee on that side as well. He says sometimes occasionally Aleve and she fell farther than that to his mid calf. Patient does have chronic low back issues and prostate issues with metastasis which he has been monitored for.  Relevant past medical, surgical, family and social history reviewed and updated as indicated. Interim medical history since our last visit reviewed. Allergies and medications reviewed and updated.  Review of Systems  Constitutional: Negative for chills and fever.  Respiratory: Negative for shortness of breath and wheezing.   Cardiovascular: Negative for chest pain and leg swelling.  Musculoskeletal: Positive for arthralgias and back pain. Negative for gait problem and joint swelling.  Skin: Negative for rash.  All other systems reviewed and are negative.   Per HPI unless specifically indicated above        Objective:    BP 119/79 (BP Location: Left Arm)   Pulse 79   Temp 97.6 F (36.4 C) (Oral)   Ht 5\' 7"  (1.702 m)   Wt 211 lb (95.7 kg)   BMI 33.05 kg/m   Wt Readings from Last 3 Encounters:  04/11/17 211 lb (95.7 kg)  03/20/17 216 lb 1.6 oz (98 kg)  02/08/17 215 lb 6 oz (97.7 kg)    Physical Exam  Constitutional: He is oriented  to person, place, and time. He appears well-developed and well-nourished. No distress.  Eyes: Conjunctivae are normal. No scleral icterus.  Cardiovascular: Normal rate, regular rhythm, normal heart sounds and intact distal pulses.   No murmur heard. Pulmonary/Chest: Effort normal and breath sounds normal. No respiratory distress. He has no wheezes. He has no rales.  Musculoskeletal: Normal range of motion. He exhibits no edema.       Right hip: He exhibits tenderness (Lateral hip pain and pain in the low sacroiliac region.). He exhibits normal range of motion, normal strength and no bony tenderness.  Neurological: He is alert and oriented to person, place, and time. Coordination normal.  Skin: Skin is warm and dry. No rash noted. He is not diaphoretic.  Psychiatric: He has a normal mood and affect. His behavior is normal.  Nursing note and vitals reviewed.     Assessment & Plan:   Problem List Items Addressed This Visit    None    Visit Diagnoses    Sciatic nerve pain, right    -  Primary   Relevant Medications   predniSONE (DELTASONE) 20 MG tablet   HYDROcodone-acetaminophen (NORCO/VICODIN) 5-325 MG tablet   Arthritis       Relevant Medications   predniSONE (DELTASONE)  20 MG tablet   HYDROcodone-acetaminophen (NORCO/VICODIN) 5-325 MG tablet   bicalutamide (CASODEX) 50 MG tablet       Follow up plan: Return in about 6 months (around 10/12/2017), or if symptoms worsen or fail to improve, for Hypertension recheck.  Counseling provided for all of the vaccine components No orders of the defined types were placed in this encounter.   Caryl Pina, MD [facility] 04/11/2017, 1:35 PM

## 2017-04-26 ENCOUNTER — Ambulatory Visit (INDEPENDENT_AMBULATORY_CARE_PROVIDER_SITE_OTHER): Payer: Medicaid Other | Admitting: Family Medicine

## 2017-04-26 ENCOUNTER — Encounter: Payer: Self-pay | Admitting: Family Medicine

## 2017-04-26 VITALS — BP 108/71 | HR 76 | Temp 97.0°F | Ht 67.0 in | Wt 207.0 lb

## 2017-04-26 DIAGNOSIS — M5416 Radiculopathy, lumbar region: Secondary | ICD-10-CM

## 2017-04-26 DIAGNOSIS — M5417 Radiculopathy, lumbosacral region: Secondary | ICD-10-CM | POA: Diagnosis not present

## 2017-04-26 MED ORDER — METHYLPREDNISOLONE ACETATE 80 MG/ML IJ SUSP
80.0000 mg | Freq: Once | INTRAMUSCULAR | Status: AC
  Administered 2017-04-26: 80 mg via INTRAMUSCULAR

## 2017-04-26 MED ORDER — PREDNISONE 20 MG PO TABS: ORAL_TABLET | ORAL | 0 refills | Status: DC

## 2017-04-26 NOTE — Progress Notes (Signed)
BP 108/71   Pulse 76   Temp 97 F (36.1 C) (Oral)   Ht 5\' 7"  (1.702 m)   Wt 207 lb (93.9 kg)   BMI 32.42 kg/m    Subjective:    [name], male    DOB: [birth year], 66 y.o.   MRN: [id number]  HPI: Walter Boone is a 66 y.o. male presenting on 04/26/2017 for Back Pain (right lumbar region radiating into buttocks and down side of leg)   HPI Right lower back pain Patient has had continued right lower back pain for the past couple weeks. He did a short course of prednisone and did not feel like it would help significantly. He has had issues with his back in the past but this time it has been more persistent recently and he feels like he cannot get better. The pain radiates all the way down his leg to his foot on that right side. He denies any numbness or weakness. He denies any fevers or chills or redness or warmth. He feels like it has continued to get worse. It has been sometime since he has had imaging of his lower back.  Relevant past medical, surgical, family and social history reviewed and updated as indicated. Interim medical history since our last visit reviewed. Allergies and medications reviewed and updated.  Review of Systems  Constitutional: Negative for chills and fever.  Respiratory: Negative for shortness of breath and wheezing.   Cardiovascular: Negative for chest pain and leg swelling.  Musculoskeletal: Positive for back pain. Negative for gait problem.  Skin: Negative for color change and rash.  Neurological: Negative for weakness, numbness and headaches.  All other systems reviewed and are negative.   Per HPI unless specifically indicated above     Objective:    BP 108/71   Pulse 76   Temp 97 F (36.1 C) (Oral)   Ht 5\' 7"  (1.702 m)   Wt 207 lb (93.9 kg)   BMI 32.42 kg/m   Wt Readings from Last 3 Encounters:  04/26/17 207 lb (93.9 kg)  04/11/17 211 lb (95.7 kg)  03/20/17 216 lb 1.6 oz (98 kg)    Physical Exam    Constitutional: He is oriented to person, place, and time. He appears well-developed and well-nourished. No distress.  Eyes: Conjunctivae are normal. No scleral icterus.  Musculoskeletal: Normal range of motion. He exhibits tenderness. He exhibits no edema.       Lumbar back: He exhibits tenderness and spasm. He exhibits normal range of motion, no bony tenderness, no swelling and no edema.       Back:  Neurological: He is alert and oriented to person, place, and time. Coordination normal.  Skin: Skin is warm and dry. No rash noted. He is not diaphoretic.  Psychiatric: He has a normal mood and affect. His behavior is normal.  Nursing note and vitals reviewed.      Assessment & Plan:   Problem List Items Addressed This Visit    None    Visit Diagnoses    Lumbar back pain with radiculopathy affecting right lower extremity    -  Primary   Relevant Medications   methylPREDNISolone acetate (DEPO-MEDROL) injection 80 mg (Completed)   predniSONE (DELTASONE) 20 MG tablet   Other Relevant Orders   MR Lumbar Spine Wo Contrast     Follow up plan: Return if symptoms worsen or fail to improve.  Counseling provided for all of the vaccine components Orders Placed This Encounter  Procedures  . MR Lumbar Spine Wo Contrast    Caryl Pina, MD [facility] 04/26/2017, 10:28 AM

## 2017-05-15 ENCOUNTER — Encounter: Payer: Self-pay | Admitting: Family Medicine

## 2017-05-15 ENCOUNTER — Ambulatory Visit (INDEPENDENT_AMBULATORY_CARE_PROVIDER_SITE_OTHER): Payer: Medicare Other | Admitting: Family Medicine

## 2017-05-15 VITALS — BP 133/84 | HR 79 | Temp 97.0°F | Ht 67.0 in | Wt 205.0 lb

## 2017-05-15 DIAGNOSIS — M5417 Radiculopathy, lumbosacral region: Secondary | ICD-10-CM

## 2017-05-15 DIAGNOSIS — M5416 Radiculopathy, lumbar region: Secondary | ICD-10-CM

## 2017-05-15 MED ORDER — CYCLOBENZAPRINE HCL 10 MG PO TABS: 10.0000 mg | ORAL_TABLET | Freq: Three times a day (TID) | ORAL | 1 refills | Status: DC | PRN

## 2017-05-15 NOTE — Progress Notes (Signed)
BP 133/84   Pulse 79   Temp 97 F (36.1 C) (Oral)   Ht 5\' 7"  (1.702 m)   Wt 205 lb (93 kg)   BMI 32.11 kg/m    Subjective:    [name], male    DOB: 11/20/1951, 66 y.o.   MRN: [id number]  HPI: Walter Boone is a 66 y.o. male presenting on 05/15/2017 for Sciatic pain, hip pain (right side, radiating into leg; fell from a ladder last week, 2 steps up; painful to bend leg, unable to bend leg up)   HPI Right low back pain that goes down his right leg Patient has been fighting this right lobe back pain that is been radiating down his leg for quite a few months but it is recently worsened when he fell from a ladder onto his buttocks of about 5 feet fall. He says the pain is right where it usually is but the shooting pain is just been more frequent. He denies any numbness or weakness or loss of bowel or bladder. He rates the pain as an 8 out of 10. He says bending his leg up or taking steps up makes it worse but laying down or resting makes it better. He has been using his hydrocodone for this and Voltaren which have helped  Relevant past medical, surgical, family and social history reviewed and updated as indicated. Interim medical history since our last visit reviewed. Allergies and medications reviewed and updated.  Review of Systems  Constitutional: Negative for chills and fever.  Respiratory: Negative for shortness of breath and wheezing.   Cardiovascular: Negative for chest pain and leg swelling.  Musculoskeletal: Positive for back pain and myalgias. Negative for gait problem.  Skin: Negative for rash.  Neurological: Negative for weakness and numbness.  All other systems reviewed and are negative.   Per HPI unless specifically indicated above        Objective:    BP 133/84   Pulse 79   Temp 97 F (36.1 C) (Oral)   Ht 5\' 7"  (1.702 m)   Wt 205 lb (93 kg)   BMI 32.11 kg/m   Wt Readings from Last 3 Encounters:  05/15/17 205 lb (93 kg)    04/26/17 207 lb (93.9 kg)  04/11/17 211 lb (95.7 kg)    Physical Exam  Constitutional: He is oriented to person, place, and time. He appears well-developed and well-nourished. No distress.  Eyes: Conjunctivae are normal. No scleral icterus.  Cardiovascular: Normal rate, regular rhythm, normal heart sounds and intact distal pulses.   No murmur heard. Pulmonary/Chest: Effort normal and breath sounds normal. No respiratory distress. He has no wheezes. He has no rales.  Musculoskeletal: Normal range of motion. He exhibits no edema.       Lumbar back: He exhibits tenderness. He exhibits normal range of motion, no bony tenderness, no swelling and no deformity.       Back:  Neurological: He is alert and oriented to person, place, and time. Coordination normal.  Skin: Skin is warm and dry. No rash noted. He is not diaphoretic.  Psychiatric: He has a normal mood and affect. His behavior is normal.  Nursing note and vitals reviewed.       Assessment & Plan:   Problem List Items Addressed This Visit    None    Visit Diagnoses    Lumbar back pain with radiculopathy affecting right lower extremity    -  Primary   Relevant Medications  cyclobenzaprine (FLEXERIL) 10 MG tablet   Other Relevant Orders   Ambulatory referral to Orthopedic Surgery       Follow up plan: Return if symptoms worsen or fail to improve.  Counseling provided for all of the vaccine components Orders Placed This Encounter  Procedures  . Ambulatory referral to [place] Dettinger, MD [facility] 05/15/2017, 4:43 PM

## 2017-05-16 ENCOUNTER — Telehealth: Payer: Self-pay | Admitting: Family Medicine

## 2017-05-16 NOTE — Telephone Encounter (Signed)
FYI

## 2017-05-17 MED ORDER — METHOCARBAMOL 500 MG PO TABS: 500.0000 mg | ORAL_TABLET | Freq: Four times a day (QID) | ORAL | 1 refills | Status: DC

## 2017-05-17 MED ORDER — DICLOFENAC SODIUM 75 MG PO TBEC: 75.0000 mg | DELAYED_RELEASE_TABLET | Freq: Two times a day (BID) | ORAL | 1 refills | Status: DC

## 2017-05-17 NOTE — Telephone Encounter (Signed)
LMOVM that Rx was sent to pharmacy

## 2017-05-17 NOTE — Telephone Encounter (Signed)
Pt is not able to take the flexeril during the day they are making him sleeping and he has to drive. Can these be changed

## 2017-05-17 NOTE — Telephone Encounter (Signed)
Yet go ahead and send him a prescription of methocarbamol 500 mg 4 times a day when necessary. Give him enough for 2 months

## 2017-05-23 ENCOUNTER — Telehealth: Payer: Self-pay

## 2017-05-24 MED ORDER — MELOXICAM 15 MG PO TABS: 15.0000 mg | ORAL_TABLET | Freq: Every day | ORAL | 2 refills | Status: DC

## 2017-05-24 NOTE — Telephone Encounter (Signed)
Pt notified of RX Verbalizes understanding 

## 2017-05-25 ENCOUNTER — Encounter: Payer: Self-pay | Admitting: Family Medicine

## 2017-05-25 ENCOUNTER — Ambulatory Visit (INDEPENDENT_AMBULATORY_CARE_PROVIDER_SITE_OTHER): Payer: Medicare Other | Admitting: Family Medicine

## 2017-05-25 VITALS — BP 127/84 | HR 90 | Temp 97.1°F | Ht 67.0 in | Wt 204.0 lb

## 2017-05-25 DIAGNOSIS — Z0000 Encounter for general adult medical examination without abnormal findings: Secondary | ICD-10-CM

## 2017-05-25 NOTE — Progress Notes (Deleted)
Subjective:   Walter Boone is a 66 y.o. male who presents for an Initial Medicare Annual Wellness Visit.  Review of Systems       Objective:    Today's Vitals   05/25/17 1023  BP: 127/84  Pulse: 90  Temp: 97.1 F (36.2 C)  TempSrc: Oral  Weight: 204 lb (92.5 kg)  Height: 5\' 7"  (1.702 m)   Body mass index is 31.95 kg/m.  Current Medications (verified) Outpatient Encounter Prescriptions as of 05/25/2017  Medication Sig  . amLODipine (NORVASC) 10 MG tablet Take 1 tablet (10 mg total) by mouth daily.  . bicalutamide (CASODEX) 50 MG tablet Take 1 tablet (50 mg total) by mouth daily.  Marland Kitchen denosumab (PROLIA) 60 MG/ML SOLN injection Inject 60 mg into the skin every 6 (six) months. Administer in upper arm, thigh, or abdomen  . diclofenac (VOLTAREN) 75 MG EC tablet Take 1 tablet (75 mg total) by mouth 2 (two) times daily.  Marland Kitchen HYDROcodone-acetaminophen (NORCO/VICODIN) 5-325 MG tablet Take 1 tablet by mouth every 6 (six) hours as needed for moderate pain.  Marland Kitchen Leuprolide Acetate ([name] IJ) Inject as directed every 3 (three) months.  . meloxicam (MOBIC) 15 MG tablet Take 1 tablet (15 mg total) by mouth daily.  . methocarbamol (ROBAXIN) 500 MG tablet Take 1 tablet (500 mg total) by mouth 4 (four) times daily.  . predniSONE (DELTASONE) 20 MG tablet Take 3 tabs daily for 1 week, then 2 tabs daily for week 2, then 1 tab daily for week 3.  . [DISCONTINUED] cyclobenzaprine (FLEXERIL) 10 MG tablet Take 1 tablet (10 mg total) by mouth 3 (three) times daily as needed for muscle spasms.   No facility-administered encounter medications on file as of 05/25/2017.     Allergies (verified) Tylenol [acetaminophen]   History: Past Medical History:  Diagnosis Date  . Fatty liver   . Gynecomastia, male 01/11/2013   Secondary to prostate ca Tx.   . H/O Bell's palsy   . Hemorrhoids    history  . High cholesterol   . History of back injury 11/19/2013  . Hyperglycemia   . Hyperlipidemia   .  Hypertension   . Prostate cancer ([place])   . Prostate cancer ([place]) 06/20/2011  . Prostate carcinoma, recurrent ([place]) 08/10/2012   Past Surgical History:  Procedure Laterality Date  . [place]   left  . COLONOSCOPY  2008  . ESOPHAGOGASTRODUODENOSCOPY    . HERNIA REPAIR  2010  . I&D EXTREMITY Left 06/10/2015   Procedure: IRRIGATION AND DEBRIDEMENT EXTREMITY LEFT HAND EXPLORATION, nerve repair;  Surgeon: Charlotte Crumb, MD;  Location: [facility];  Service: Orthopedics;  Laterality: Left;  . KNEE SURGERY     left  . PROSTATE BIOPSY  11/06   Family History  Problem Relation Age of Onset  . Heart failure Mother   . Diabetes Father   . Cancer Sister   . Diabetes Brother    Social History   Occupational History  . Not on file.   Social History Main Topics  . Smoking status: Former Smoker    Packs/day: 2.50    Years: 3.00  . Smokeless tobacco: Never Used  . Alcohol use No     Comment: former drinker 20 years ago  . Drug use: No  . Sexual activity: Not on file   Tobacco Counseling Counseling given: Not Answered   Activities of Daily Living In your present state of health, do you have any difficulty performing the following  activities: 05/25/2017  Hearing? N  Vision? Y  Difficulty concentrating or making decisions? N  Walking or climbing stairs? N  Dressing or bathing? Y  Doing errands, shopping? N  Some recent data might be hidden    Immunizations and Health Maintenance Immunization History  Administered Date(s) Administered  . DTaP 06/10/2015  . Influenza,inj,Quad PF,36+ Mos 10/29/2014, 09/12/2016   Health Maintenance Due  Topic Date Due  . Hepatitis C Screening  08/14/1951  . HIV Screening  05/26/1968  . COLON CANCER SCREENING ANNUAL FOBT  07/03/2013    Patient Care Team: Sarann Tregre, Fransisca Kaufmann, MD as PCP - General (Family Medicine) Sheldon Silvan, Scharlene Corn as Physician Assistant (Oncology) Marybelle Killings, MD as Consulting Physician (Orthopedic  Surgery)  Indicate any recent Medical Services you may have received from other than [facility] providers in the past year (date may be approximate).    Assessment:   This is a routine wellness examination for [employer]. ***  Hearing/Vision screen No exam data present  Dietary issues and exercise activities discussed:    Goals    . Exercise 3x per week (30 min per time)    . Have 3 meals a day      Depression Screen PHQ 2/9 Scores 05/25/2017 05/15/2017 04/26/2017 04/11/2017  PHQ - 2 Score 1 1 1  0  PHQ- 9 Score - - - -    Fall Risk Fall Risk  04/11/2017 02/08/2017 12/23/2016 12/14/2016 08/29/2016  Falls in the past year? No No No No No  Number falls in past yr: - - - - -  Injury with Fall? - - - - -  Risk Factor Category  - - - - -  Risk for fall due to : - - - - -  Risk for fall due to (comments): - - - - -    Cognitive Function: MMSE - Mini Mental State Exam 05/25/2017  Orientation to time 5  Orientation to Place 5  Registration 3  Attention/ Calculation 5  Recall 3  Language- name 2 objects 2  Language- repeat 1  Language- follow 3 step command 3  Language- read & follow direction 1  Write a sentence 1  Copy design 1  Total score 30        Screening Tests Health Maintenance  Topic Date Due  . Hepatitis C Screening  11/09/1951  . HIV Screening  05/26/1968  . COLON CANCER SCREENING ANNUAL FOBT  07/03/2013  . INFLUENZA VACCINE  07/12/2017  . TETANUS/TDAP  10/29/2026        Plan:   ***  I have personally reviewed and noted the following in the patient's chart:   . Medical and social history . Use of alcohol, tobacco or illicit drugs  . Current medications and supplements . Functional ability and status . Nutritional status . Physical activity . Advanced directives . List of other physicians . Hospitalizations, surgeries, and ER visits in previous 12 months . Vitals . Screenings to include cognitive, depression, and falls . Referrals and appointments  In  addition, I have reviewed and discussed with patient certain preventive protocols, quality metrics, and best practice recommendations. A written personalized care plan for preventive services as well as general preventive health recommendations were provided to patient.     Worthy Rancher, MD   05/25/2017

## 2017-05-25 NOTE — Progress Notes (Deleted)
Subjective:   Walter Boone is a 66 y.o. male who presents for an Initial Medicare Annual Wellness Visit.  Review of Systems       Objective:    Today's Vitals   05/25/17 1023  BP: 127/84  Pulse: 90  Temp: 97.1 F (36.2 C)  TempSrc: Oral  Weight: 204 lb (92.5 kg)  Height: 5\' 7"  (1.702 m)   Body mass index is 31.95 kg/m.  Current Medications (verified) Outpatient Encounter Prescriptions as of 05/25/2017  Medication Sig  . amLODipine (NORVASC) 10 MG tablet Take 1 tablet (10 mg total) by mouth daily.  . bicalutamide (CASODEX) 50 MG tablet Take 1 tablet (50 mg total) by mouth daily.  Marland Kitchen denosumab (PROLIA) 60 MG/ML SOLN injection Inject 60 mg into the skin every 6 (six) months. Administer in upper arm, thigh, or abdomen  . diclofenac (VOLTAREN) 75 MG EC tablet Take 1 tablet (75 mg total) by mouth 2 (two) times daily.  Marland Kitchen HYDROcodone-acetaminophen (NORCO/VICODIN) 5-325 MG tablet Take 1 tablet by mouth every 6 (six) hours as needed for moderate pain.  Marland Kitchen Leuprolide Acetate ([name] IJ) Inject as directed every 3 (three) months.  . meloxicam (MOBIC) 15 MG tablet Take 1 tablet (15 mg total) by mouth daily.  . methocarbamol (ROBAXIN) 500 MG tablet Take 1 tablet (500 mg total) by mouth 4 (four) times daily.  . predniSONE (DELTASONE) 20 MG tablet Take 3 tabs daily for 1 week, then 2 tabs daily for week 2, then 1 tab daily for week 3.  . [DISCONTINUED] cyclobenzaprine (FLEXERIL) 10 MG tablet Take 1 tablet (10 mg total) by mouth 3 (three) times daily as needed for muscle spasms.   No facility-administered encounter medications on file as of 05/25/2017.     Allergies (verified) Tylenol [acetaminophen]   History: Past Medical History:  Diagnosis Date  . Fatty liver   . Gynecomastia, male 01/11/2013   Secondary to prostate ca Tx.   . H/O Bell's palsy   . Hemorrhoids    history  . High cholesterol   . History of back injury 11/19/2013  . Hyperglycemia   . Hyperlipidemia   .  Hypertension   . Prostate cancer ([place])   . Prostate cancer ([place]) 06/20/2011  . Prostate carcinoma, recurrent ([place]) 08/10/2012   Past Surgical History:  Procedure Laterality Date  . [place]   left  . COLONOSCOPY  2008  . ESOPHAGOGASTRODUODENOSCOPY    . HERNIA REPAIR  2010  . I&D EXTREMITY Left 06/10/2015   Procedure: IRRIGATION AND DEBRIDEMENT EXTREMITY LEFT HAND EXPLORATION, nerve repair;  Surgeon: Charlotte Crumb, MD;  Location: [facility];  Service: Orthopedics;  Laterality: Left;  . KNEE SURGERY     left  . PROSTATE BIOPSY  11/06   Family History  Problem Relation Age of Onset  . Heart failure Mother   . Diabetes Father   . Cancer Sister   . Diabetes Brother    Social History   Occupational History  . Not on file.   Social History Main Topics  . Smoking status: Former Smoker    Packs/day: 2.50    Years: 3.00  . Smokeless tobacco: Never Used  . Alcohol use No     Comment: former drinker 20 years ago  . Drug use: No  . Sexual activity: Not on file   Tobacco Counseling Counseling given: Not Answered   Activities of Daily Living In your present state of health, do you have any difficulty performing the following  activities: 05/25/2017  Hearing? N  Vision? Y  Difficulty concentrating or making decisions? N  Walking or climbing stairs? N  Dressing or bathing? Y  Doing errands, shopping? N  Some recent data might be hidden    Immunizations and Health Maintenance Immunization History  Administered Date(s) Administered  . DTaP 06/10/2015  . Influenza,inj,Quad PF,36+ Mos 10/29/2014, 09/12/2016   Health Maintenance Due  Topic Date Due  . Hepatitis C Screening  05/28/1951  . HIV Screening  05/26/1968  . COLON CANCER SCREENING ANNUAL FOBT  07/03/2013    Patient Care Team: Dettinger, Fransisca Kaufmann, MD as PCP - General (Family Medicine) Sheldon Silvan, Scharlene Corn as Physician Assistant (Oncology) Marybelle Killings, MD as Consulting Physician (Orthopedic  Surgery)  Indicate any recent Medical Services you may have received from other than [facility] providers in the past year (date may be approximate).    Assessment:   This is a routine wellness examination for [place]. ***  Hearing/Vision screen No exam data present  Dietary issues and exercise activities discussed:    Goals    . Exercise 3x per week (30 min per time)    . Have 3 meals a day      Depression Screen PHQ 2/9 Scores 05/25/2017 05/15/2017 04/26/2017 04/11/2017  PHQ - 2 Score 1 1 1  0  PHQ- 9 Score - - - -    Fall Risk Fall Risk  04/11/2017 02/08/2017 12/23/2016 12/14/2016 08/29/2016  Falls in the past year? No No No No No  Number falls in past yr: - - - - -  Injury with Fall? - - - - -  Risk Factor Category  - - - - -  Risk for fall due to : - - - - -  Risk for fall due to (comments): - - - - -    Cognitive Function: MMSE - Mini Mental State Exam 05/25/2017  Orientation to time 5  Orientation to Place 5  Registration 3  Attention/ Calculation 5  Recall 3  Language- name 2 objects 2  Language- repeat 1  Language- follow 3 step command 3  Language- read & follow direction 1  Write a sentence 1  Copy design 1  Total score 30        Screening Tests Health Maintenance  Topic Date Due  . Hepatitis C Screening  01-Mar-1951  . HIV Screening  05/26/1968  . COLON CANCER SCREENING ANNUAL FOBT  07/03/2013  . INFLUENZA VACCINE  07/12/2017  . TETANUS/TDAP  10/29/2026        Plan:   ***  I have personally reviewed and noted the following in the patient's chart:   . Medical and social history . Use of alcohol, tobacco or illicit drugs  . Current medications and supplements . Functional ability and status . Nutritional status . Physical activity . Advanced directives . List of other physicians . Hospitalizations, surgeries, and ER visits in previous 12 months . Vitals . Screenings to include cognitive, depression, and falls . Referrals and appointments  In  addition, I have reviewed and discussed with patient certain preventive protocols, quality metrics, and best practice recommendations. A written personalized care plan for preventive services as well as general preventive health recommendations were provided to patient.     Burnadette Pop, LPN   01/05/5808

## 2017-05-25 NOTE — Progress Notes (Signed)
Subjective:   Walter Boone is a 66 y.o. male who presents for a Welcome to Medicare exam.   Review of Systems: Review of Systems  Constitutional: Negative for chills and fever.  HENT: Negative for ear pain and tinnitus.   Eyes: Negative for blurred vision and pain.  Respiratory: Negative for cough, shortness of breath and wheezing.   Cardiovascular: Negative for chest pain, palpitations and leg swelling.  Gastrointestinal: Negative for abdominal pain, blood in stool, constipation, diarrhea and melena.  Genitourinary: Negative for dysuria and hematuria.  Musculoskeletal: Positive for back pain (Improved on current medication) and myalgias. Negative for joint pain.  Skin: Negative for rash.  Neurological: Negative for dizziness, sensory change, focal weakness, weakness and headaches.  Psychiatric/Behavioral: Negative for depression and suicidal ideas.          Objective:    Today's Vitals   05/25/17 1023  BP: 127/84  Pulse: 90  Temp: 97.1 F (36.2 C)  TempSrc: Oral  Weight: 204 lb (92.5 kg)  Height: 5\' 7"  (1.702 m)   Body mass index is 31.95 kg/m.  Medications Outpatient Encounter Prescriptions as of 05/25/2017  Medication Sig  . amLODipine (NORVASC) 10 MG tablet Take 1 tablet (10 mg total) by mouth daily.  . bicalutamide (CASODEX) 50 MG tablet Take 1 tablet (50 mg total) by mouth daily.  Marland Kitchen denosumab (PROLIA) 60 MG/ML SOLN injection Inject 60 mg into the skin every 6 (six) months. Administer in upper arm, thigh, or abdomen  . diclofenac (VOLTAREN) 75 MG EC tablet Take 1 tablet (75 mg total) by mouth 2 (two) times daily.  Marland Kitchen HYDROcodone-acetaminophen (NORCO/VICODIN) 5-325 MG tablet Take 1 tablet by mouth every 6 (six) hours as needed for moderate pain.  Marland Kitchen Leuprolide Acetate ([name] IJ) Inject as directed every 3 (three) months.  . meloxicam (MOBIC) 15 MG tablet Take 1 tablet (15 mg total) by mouth daily.  . methocarbamol (ROBAXIN) 500 MG tablet Take 1 tablet (500 mg  total) by mouth 4 (four) times daily.  . predniSONE (DELTASONE) 20 MG tablet Take 3 tabs daily for 1 week, then 2 tabs daily for week 2, then 1 tab daily for week 3.  . [DISCONTINUED] cyclobenzaprine (FLEXERIL) 10 MG tablet Take 1 tablet (10 mg total) by mouth 3 (three) times daily as needed for muscle spasms.   No facility-administered encounter medications on file as of 05/25/2017.      History: Past Medical History:  Diagnosis Date  . Fatty liver   . Gynecomastia, male 01/11/2013   Secondary to prostate ca Tx.   . H/O Bell's palsy   . Hemorrhoids    history  . High cholesterol   . History of back injury 11/19/2013  . Hyperglycemia   . Hyperlipidemia   . Hypertension   . Prostate cancer ([place])   . Prostate cancer ([place]) 06/20/2011  . Prostate carcinoma, recurrent ([place]) 08/10/2012   Past Surgical History:  Procedure Laterality Date  . [place]   left  . COLONOSCOPY  2008  . ESOPHAGOGASTRODUODENOSCOPY    . HERNIA REPAIR  2010  . I&D EXTREMITY Left 06/10/2015   Procedure: IRRIGATION AND DEBRIDEMENT EXTREMITY LEFT HAND EXPLORATION, nerve repair;  Surgeon: Charlotte Crumb, MD;  Location: [place];  Service: Orthopedics;  Laterality: Left;  . KNEE SURGERY     left  . PROSTATE BIOPSY  11/06    Family History  Problem Relation Age of Onset  . Heart failure Mother   . Diabetes Father   .  Cancer Sister   . Diabetes Brother    Social History   Occupational History  . Not on file.   Social History Main Topics  . Smoking status: Former Smoker    Packs/day: 2.50    Years: 3.00  . Smokeless tobacco: Never Used  . Alcohol use No     Comment: former drinker 20 years ago  . Drug use: No  . Sexual activity: Not on file   Tobacco Counseling Counseling given: Not Answered   Immunizations and Health Maintenance Immunization History  Administered Date(s) Administered  . DTaP 06/10/2015  . Influenza,inj,Quad PF,36+ Mos 10/29/2014, 09/12/2016   Health Maintenance Due    Topic Date Due  . Hepatitis C Screening  10/07/1951  . HIV Screening  05/26/1968  . COLON CANCER SCREENING ANNUAL FOBT  07/03/2013    Activities of Daily Living In your present state of health, do you have any difficulty performing the following activities: 05/25/2017  Hearing? N  Vision? Y  Difficulty concentrating or making decisions? N  Walking or climbing stairs? N  Dressing or bathing? Y  Doing errands, shopping? N  Some recent data might be hidden    Physical Exam  (optional), or other factors deemed appropriate based on the beneficiary's medical and social history and current clinical standards.  Advanced Directives: Does Patient Have a Medical Advance Directive?: No Would patient like information on creating a medical advance directive?: No - Patient declined    Assessment:    This is a routine wellness  examination for this patient .  Problem List Items Addressed This Visit    None    Visit Diagnoses    Medicare annual wellness visit, initial    -  Primary   Encounter for Medicare annual wellness exam           Vision/Hearing screen No exam data present  Dietary issues and exercise activities discussed:     Goals    . Exercise 3x per week (30 min per time)    . Have 3 meals a day       Depression Screen PHQ 2/9 Scores 05/25/2017 05/15/2017 04/26/2017 04/11/2017  PHQ - 2 Score 1 1 1  0  PHQ- 9 Score - - - -     Fall Risk Fall Risk  04/11/2017  Falls in the past year? No  Number falls in past yr: -  Injury with Fall? -  Risk Factor Category  -  Risk for fall due to : -  Risk for fall due to (comments): -    Cognitive Function MMSE - Mini Mental State Exam 05/25/2017  Orientation to time 5  Orientation to Place 5  Registration 3  Attention/ Calculation 5  Recall 3  Language- name 2 objects 2  Language- repeat 1  Language- follow 3 step command 3  Language- read & follow direction 1  Write a sentence 1  Copy design 1  Total score 30         Patient Care Team: Amerigo Mcglory, Fransisca Kaufmann, MD as PCP - General (Family Medicine) Baird Cancer, PA-C as Physician Assistant (Oncology) Marybelle Killings, MD as Consulting Physician (Orthopedic Surgery)     Plan:    I have personally reviewed and noted the following in the patient's chart:   . Medical and social history . Use of alcohol, tobacco or illicit drugs  . Current medications and supplements . Functional ability and status . Nutritional status . Physical activity . Advanced directives . List  of other physicians . Hospitalizations, surgeries, and ER visits in previous 12 months . Vitals . Screenings to include cognitive, depression, and falls . Referrals and appointments  In addition, I have reviewed and discussed with patient certain preventive protocols, quality metrics, and best practice recommendations. A written personalized care plan for preventive services as well as general preventive health recommendations were provided to patient.    Worthy Rancher, MD 05/25/2017

## 2017-05-30 ENCOUNTER — Ambulatory Visit: Payer: Medicare Other

## 2017-06-01 ENCOUNTER — Encounter (INDEPENDENT_AMBULATORY_CARE_PROVIDER_SITE_OTHER): Payer: Self-pay | Admitting: Orthopaedic Surgery

## 2017-06-01 ENCOUNTER — Ambulatory Visit (INDEPENDENT_AMBULATORY_CARE_PROVIDER_SITE_OTHER): Payer: Medicare Other

## 2017-06-01 ENCOUNTER — Ambulatory Visit (INDEPENDENT_AMBULATORY_CARE_PROVIDER_SITE_OTHER): Payer: Medicare Other | Admitting: Orthopaedic Surgery

## 2017-06-01 VITALS — BP 131/86 | HR 86 | Ht 66.0 in | Wt 205.0 lb

## 2017-06-01 DIAGNOSIS — G8929 Other chronic pain: Secondary | ICD-10-CM | POA: Diagnosis not present

## 2017-06-01 DIAGNOSIS — M545 Low back pain: Secondary | ICD-10-CM

## 2017-06-01 NOTE — Progress Notes (Signed)
Office Visit Note   Patient: Walter Boone           Date of Birth: 10-11-1951           MRN: [id number] Visit Date: 06/01/2017              Requested by: Dettinger, Fransisca Kaufmann, MD [place], [place] [address] PCP: Dettinger, Fransisca Kaufmann, MD   Assessment & Plan: Visit Diagnoses:  1. Chronic right-sided low back pain, with sciatica presence unspecified     Plan: She can continue the anti-inflammatory and Robaxin. With some effort course of physical therapy see him back in 5 weeks. He is having persistent symptoms we'll consider repeating his MRI scan. Previous MRI scan results from 2015 was reviewed with the patient.  Follow-Up Instructions: Return in about 5 weeks (around 07/06/2017).   Orders:  Orders Placed This Encounter  Procedures  . XR Lumbar Spine 2-3 Views   No orders of the defined types were placed in this encounter.     Procedures: No procedures performed   Clinical Data: No additional findings.   Subjective: Chief Complaint  Patient presents with  . 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 He had a scan that showed some spondylolysis at L5 and some disc degeneration at 4551. Had injections 2016 again him some relief. He is been improved with muscle relaxant and anti-inflammatory. He is a nonsmoker only smoked for 2 years . Pelvic and Robaxin seem to happen most he did have a prednisone pack. He states in the last few months his pain is increased significantly. Review of Systems review systems positive for gynecomastia, treatment for prostate cancer, prediabetes, osteopenia by x-ray, urinary frequency, GERD, hypertension, chronic back pain., L5 spondylolysis and L4-5 L5-S1 disc degeneration.   Objective: Vital Signs: BP 131/86   Pulse 86   Ht 5\' 6"  (1.676 m)   Wt 205 lb (93 kg)   BMI 33.09 kg/m   Physical Exam  Constitutional: He is oriented to person, place, and time. He appears  well-developed and well-nourished.  HENT:  Head: Normocephalic and atraumatic.  Eyes: EOM are normal. Pupils are equal, round, and reactive to light.  Neck: No tracheal deviation present. No thyromegaly present.  Cardiovascular: Normal rate.   Pulmonary/Chest: Effort normal. He has no wheezes.  Abdominal: Soft. Bowel sounds are normal.  Musculoskeletal:  Patient is ambulatory and heel and toe walk. He has pain with forward bending. He has some sciatic notch tenderness on the right side negative on the left. Pain with straight leg raising 70 positive operative compression test tenderness over the fibular neck proximally. Anterior tib EHL is strong minimal trochanteric bursal tenderness. Reflexes are symmetrical.  Neurological: He is alert and oriented to person, place, and time.  Skin: Skin is warm and dry. Capillary refill takes less than 2 seconds.  Psychiatric: He has a normal mood and affect. His behavior is normal. Judgment and thought content normal.    Ortho Exam  Specialty Comments:  No specialty comments available.  Imaging: No results found.   PMFS History: Patient Active Problem List   Diagnosis Date Noted  . GERD (gastroesophageal reflux disease) 11/22/2016  . Urinary frequency 09/13/2016  . Knee pain, left 02/11/2016  . Prediabetes 01/27/2016  . Osteopenia determined by x-ray 02/01/2015  . Hypertension   . Hyperlipidemia   . Gynecomastia, male 01/11/2013  . Prostate cancer ([place]) 06/20/2011  .  H N P-CERVICAL 10/05/2009  . CERVICAL SPASM 10/05/2009   Past Medical History:  Diagnosis Date  . Acid reflux   . Alcoholism ([place])   . Arthritis   . Fatty liver   . Gynecomastia, male 01/11/2013   Secondary to prostate ca Tx.   . H/O Bell's palsy   . Hemorrhoids    history  . High cholesterol   . History of back injury 11/19/2013  . Hyperglycemia   . Hyperlipidemia   . Hypertension   . Prostate cancer ([place])   . Prostate cancer ([place]) 06/20/2011  . Prostate  carcinoma, recurrent ([place]) 08/10/2012    Family History  Problem Relation Age of Onset  . Heart failure Mother   . Diabetes Father   . Cancer Sister   . Diabetes Brother     Past Surgical History:  Procedure Laterality Date  . [place]   left  . COLONOSCOPY  2008  . ESOPHAGOGASTRODUODENOSCOPY    . HERNIA REPAIR  2010  . I&D EXTREMITY Left 06/10/2015   Procedure: IRRIGATION AND DEBRIDEMENT EXTREMITY LEFT HAND EXPLORATION, nerve repair;  Surgeon: Charlotte Crumb, MD;  Location: [facility];  Service: Orthopedics;  Laterality: Left;  . KNEE SURGERY     left  . PROSTATE BIOPSY  11/06   Social History   Occupational History  . Not on file.   Social History Main Topics  . Smoking status: Former Smoker    Packs/day: 2.50    Years: 3.00  . Smokeless tobacco: Never Used  . Alcohol use No     Comment: former drinker 20 years ago  . Drug use: No  . Sexual activity: Not on file

## 2017-06-12 ENCOUNTER — Ambulatory Visit: Payer: Medicare Other | Attending: Orthopaedic Surgery | Admitting: Physical Therapy

## 2017-06-12 DIAGNOSIS — G8929 Other chronic pain: Secondary | ICD-10-CM

## 2017-06-12 DIAGNOSIS — M545 Low back pain: Secondary | ICD-10-CM | POA: Diagnosis not present

## 2017-06-12 NOTE — Therapy (Signed)
[facility] [place], [place], [address] Phone: [phone]   Fax:  [phone]  Physical Therapy Evaluation  Patient Details  Name: Walter Boone MRN: [id number] Date of Birth: 10-18-1951 Referring Provider: Rodell Perna MD  Encounter Date: 06/12/2017      PT End of Session - 06/12/17 1321    Visit Number 1   Number of Visits 12   Date for PT Re-Evaluation 07/24/17   PT Start Time 1115   PT Stop Time 1207   PT Time Calculation (min) 52 min   Activity Tolerance Patient tolerated treatment well   Behavior During Therapy [facility] for tasks assessed/performed      Past Medical History:  Diagnosis Date  . Acid reflux   . Alcoholism ([place])   . Arthritis   . Fatty liver   . Gynecomastia, male 01/11/2013   Secondary to prostate ca Tx.   . H/O Bell's palsy   . Hemorrhoids    history  . High cholesterol   . History of back injury 11/19/2013  . Hyperglycemia   . Hyperlipidemia   . Hypertension   . Prostate cancer ([place])   . Prostate cancer ([place]) 06/20/2011  . Prostate carcinoma, recurrent ([place]) 08/10/2012    Past Surgical History:  Procedure Laterality Date  . [place]   left  . COLONOSCOPY  2008  . ESOPHAGOGASTRODUODENOSCOPY    . HERNIA REPAIR  2010  . I&D EXTREMITY Left 06/10/2015   Procedure: IRRIGATION AND DEBRIDEMENT EXTREMITY LEFT HAND EXPLORATION, nerve repair;  Surgeon: Charlotte Crumb, MD;  Location: [facility];  Service: Orthopedics;  Laterality: Left;  . KNEE SURGERY     left  . PROSTATE BIOPSY  11/06    There were no vitals filed for this visit.       Subjective Assessment - 06/12/17 1335    Subjective The patient states approximately 11 months ago he began to experience low back pain with radiation into his right LE.  An X-ray revealed:  Lumbar disc degeneration as described above. L5 spondylolysis.  Please refer to X-ray under "imaging" tab.  His rated pain today is an 8/10.  Medication  decreases his pain just a bit for a few hours and walking, sitting, dricving and standing increases his pain.            [facility] PT Assessment - 06/12/17 0001      Assessment   Medical Diagnosis Low back pain with radiation to right leg.   Referring Provider Rodell Perna MD   Onset Date/Surgical Date --  11 months ago.     Precautions   Precautions --  No ultrasound or heat.     Restrictions   Weight Bearing Restrictions No     Balance Screen   Has the patient fallen in the past 6 months Yes   How many times? --  4.   Has the patient had a decrease in activity level because of a fear of falling?  Yes   Is the patient reluctant to leave their home because of a fear of falling?  Yes     [place] residence     Prior Function   Level of Independence Independent     Posture/Postural Control   Posture/Postural Control --  Generally good posture.     ROM / Strength   AROM / PROM / Strength AROM;Strength     AROM   Overall AROM Comments WFL into active  lumbar flexion and extension without increased pain.     Strength   Overall Strength Comments Normal bilateral LE strength.     Palpation   Palpation comment tendre to palpation over right QL and iliolumbar ligament.       Special Tests    Special Tests --  (-)SLR testing;(-)FABER testing.     Transfers   Transfers --  Indepedent but slow due to pain.     Ambulation/Gait   Gait Comments The patient walks in some spinal flexion due to pain.            Objective measurements completed on examination: See above findings.          [facility] Adult PT Treatment/Exercise - 06/12/17 0001      Modalities   Modalities Electrical Stimulation     Electrical Stimulation   Electrical Stimulation Location Right low back.   Electrical Stimulation Action Pre-mod.   Electrical Stimulation Parameters Constant at 80-150 Hz x 20 minutes.   Electrical Stimulation Goals Tone;Pain                   PT Short Term Goals - 06/12/17 1333      PT SHORT TERM GOAL #1   Title STG's=LTG's.           PT Long Term Goals - 06/12/17 1333      PT LONG TERM GOAL #1   Title Independent with a HEP.   Time 6   Period Weeks   Status New     PT LONG TERM GOAL #2   Title Sit 30 minutes with pain not > 3/10.   Time 6   Period Weeks   Status New     PT LONG TERM GOAL #3   Title Eliminate right LE pain/symptoms.   Time 6   Period Weeks   Status New     PT LONG TERM GOAL #4   Title Perform ADL's with pain not > 3/10.   Time 6   Period Weeks   Status New                Plan - 06/12/17 1327    Clinical Impression Statement The patient presenst with at time severe low back pain radiating doen the length of his right LE that has been worsening over the last 11 months.  His pain prohibits him from sitting, standing, walking and driving for prolonged periods of time.  His pain also impairs his ability to perform ADL's.  His right QL is in spasm and he is very tender over his right iliolumbar ligament.  Patient will benefit from skilled physical therapy to address deficits.     History and Personal Factors relevant to plan of care: Osteopenia; DDD; prostate cancer and subsquent treatment. L5 Spondlyosis.  Grade 1 anterolisthesis.    Clinical Presentation Evolving   Clinical Decision Making Moderate   Rehab Potential Fair   PT Frequency 2x / week   PT Duration 6 weeks   PT Treatment/Interventions ADLs/Self Care Home Management;Electrical Stimulation;Patient/family education;Therapeutic activities;Therapeutic exercise;Manual techniques   PT Next Visit Plan E'stim; STW/M to right QL and iliolumbar ligament.  Core and back stabilization exercises.   Consulted and Agree with Plan of Care Patient      Patient will benefit from skilled therapeutic intervention in order to improve the following deficits and impairments:  Abnormal gait, Decreased activity  tolerance, Decreased mobility, Pain  Visit Diagnosis: Chronic right-sided low back pain, with sciatica presence unspecified -  Plan: PT plan of care cert/re-cert      G-Codes - 80/88/11 1242    Functional Assessment Tool Used (Outpatient Only) FOTO...62% limitation.   Functional Limitation Mobility: Walking and moving around   Mobility: Walking and Moving Around Current Status [phone]) At least 60 percent but less than 80 percent impaired, limited or restricted   Mobility: Walking and Moving Around Goal Status [phone]) At least 20 percent but less than 40 percent impaired, limited or restricted       Problem List Patient Active Problem List   Diagnosis Date Noted  . GERD (gastroesophageal reflux disease) 11/22/2016  . Urinary frequency 09/13/2016  . Knee pain, left 02/11/2016  . Prediabetes 01/27/2016  . Osteopenia determined by x-ray 02/01/2015  . Hypertension   . Hyperlipidemia   . Gynecomastia, male 01/11/2013  . Prostate cancer ([place]) 06/20/2011  . H N P-CERVICAL 10/05/2009  . CERVICAL SPASM 10/05/2009    APPLEGATE, [place] MPT 06/12/2017, 1:38 PM  [facility] [place], [place], [address] Phone: [phone]   Fax:  [phone]  Name: JACOBE STUDY MRN: [id number] Date of Birth: 30-Oct-1951

## 2017-06-19 ENCOUNTER — Encounter (HOSPITAL_COMMUNITY): Payer: Medicare Other | Attending: Hematology & Oncology

## 2017-06-19 ENCOUNTER — Encounter (HOSPITAL_COMMUNITY): Payer: Self-pay

## 2017-06-19 ENCOUNTER — Encounter: Payer: Self-pay | Admitting: *Deleted

## 2017-06-19 VITALS — BP 144/84 | HR 106 | Temp 98.2°F | Resp 18

## 2017-06-19 DIAGNOSIS — M8580 Other specified disorders of bone density and structure, unspecified site: Secondary | ICD-10-CM | POA: Insufficient documentation

## 2017-06-19 DIAGNOSIS — Z5111 Encounter for antineoplastic chemotherapy: Secondary | ICD-10-CM | POA: Diagnosis present

## 2017-06-19 DIAGNOSIS — C61 Malignant neoplasm of prostate: Secondary | ICD-10-CM

## 2017-06-19 MED ORDER — LEUPROLIDE ACETATE (3 MONTH) 22.5 MG IM KIT
22.5000 mg | PACK | Freq: Once | INTRAMUSCULAR | Status: AC
  Administered 2017-06-19: 22.5 mg via INTRAMUSCULAR
  Filled 2017-06-19: qty 22.5

## 2017-06-19 NOTE — Progress Notes (Unsigned)
Walter Boone presents today for injection per the provider's orders.  [name] administration without incident; see MAR for injection details.  Patient tolerated procedure well and without incident.  No questions or complaints noted at this time. Discharged ambulatory.

## 2017-06-21 ENCOUNTER — Ambulatory Visit: Payer: Medicare Other | Admitting: Physical Therapy

## 2017-06-21 DIAGNOSIS — G8929 Other chronic pain: Secondary | ICD-10-CM

## 2017-06-21 DIAGNOSIS — M545 Low back pain: Secondary | ICD-10-CM | POA: Diagnosis not present

## 2017-06-21 NOTE — Therapy (Signed)
[facility] [place], [place], [address] Phone: [phone]   Fax:  [phone]  Physical Therapy Treatment  Patient Details  Name: Walter Boone MRN: [id number] Date of Birth: 1951/04/14 Referring Provider: Rodell Perna MD  Encounter Date: 06/21/2017    Past Medical History:  Diagnosis Date  . Acid reflux   . Alcoholism ([place])   . Arthritis   . Fatty liver   . Gynecomastia, male 01/11/2013   Secondary to prostate ca Tx.   . H/O Bell's palsy   . Hemorrhoids    history  . High cholesterol   . History of back injury 11/19/2013  . Hyperglycemia   . Hyperlipidemia   . Hypertension   . Prostate cancer ([place])   . Prostate cancer ([place]) 06/20/2011  . Prostate carcinoma, recurrent ([place]) 08/10/2012    Past Surgical History:  Procedure Laterality Date  . [place]   left  . COLONOSCOPY  2008  . ESOPHAGOGASTRODUODENOSCOPY    . HERNIA REPAIR  2010  . I&D EXTREMITY Left 06/10/2015   Procedure: IRRIGATION AND DEBRIDEMENT EXTREMITY LEFT HAND EXPLORATION, nerve repair;  Surgeon: Charlotte Crumb, MD;  Location: [place] [facility] [place];  Service: Orthopedics;  Laterality: Left;  . KNEE SURGERY     left  . PROSTATE BIOPSY  11/06    There were no vitals filed for this visit.      Subjective Assessment - 06/21/17 0939    Subjective The patient states his back is feeling about the same.   Pain Score 7    Pain Location Back   Pain Orientation Right   Pain Descriptors / Indicators Aching;Shooting   Pain Onset More than a month ago                         [facility] Adult PT Treatment/Exercise - 06/21/17 0001      Modalities   Modalities Electrical Stimulation     Electrical Stimulation   Electrical Stimulation Location --  Right low back   Electrical Stimulation Action Pre-mod.   Electrical Stimulation Parameters Constant 80-150 Hz x 20 minutes.   Electrical Stimulation Goals Tone;Pain     Manual Therapy   Manual Therapy Soft tissue mobilization   Soft tissue mobilization In prone:  STW/M and right QL release x 23 minutes.                  PT Short Term Goals - 06/12/17 1333      PT SHORT TERM GOAL #1   Title STG's=LTG's.           PT Long Term Goals - 06/12/17 1333      PT LONG TERM GOAL #1   Title Independent with a HEP.   Time 6   Period Weeks   Status New     PT LONG TERM GOAL #2   Title Sit 30 minutes with pain not > 3/10.   Time 6   Period Weeks   Status New     PT LONG TERM GOAL #3   Title Eliminate right LE pain/symptoms.   Time 6   Period Weeks   Status New     PT LONG TERM GOAL #4   Title Perform ADL's with pain not > 3/10.   Time 6   Period Weeks   Status New             Patient will benefit from skilled therapeutic intervention in order to  improve the following deficits and impairments:     Visit Diagnosis: Chronic right-sided low back pain, with sciatica presence unspecified     Problem List Patient Active Problem List   Diagnosis Date Noted  . GERD (gastroesophageal reflux disease) 11/22/2016  . Urinary frequency 09/13/2016  . Knee pain, left 02/11/2016  . Prediabetes 01/27/2016  . Osteopenia determined by x-ray 02/01/2015  . Hypertension   . Hyperlipidemia   . Gynecomastia, male 01/11/2013  . Prostate cancer ([place]) 06/20/2011  . H N P-CERVICAL 10/05/2009  . CERVICAL SPASM 10/05/2009    Adrinne Sze, [place] MPT 06/21/2017, 10:10 AM  [facility] [address] [place], [place], [address] Phone: [phone]   Fax:  [phone]  Name: JAXSUN CIAMPI MRN: [id number] Date of Birth: Dec 07, 1951

## 2017-06-22 ENCOUNTER — Ambulatory Visit: Payer: Medicare Other | Admitting: Physical Therapy

## 2017-06-22 ENCOUNTER — Encounter: Payer: Self-pay | Admitting: Physical Therapy

## 2017-06-22 DIAGNOSIS — M545 Low back pain: Secondary | ICD-10-CM | POA: Diagnosis not present

## 2017-06-22 DIAGNOSIS — G8929 Other chronic pain: Secondary | ICD-10-CM | POA: Diagnosis not present

## 2017-06-22 NOTE — Therapy (Signed)
[facility] [place], [place], [address] Phone: [phone]   Fax:  [phone]  Physical Therapy Treatment  Patient Details  Name: Walter Boone MRN: [id number] Date of Birth: May 24, 1951 Referring Provider: Rodell Perna MD  Encounter Date: 06/22/2017      PT End of Session - 06/22/17 0901    Visit Number 2   Number of Visits 12   Date for PT Re-Evaluation 07/24/17   PT Start Time 0901   PT Stop Time 0943   PT Time Calculation (min) 42 min   Activity Tolerance Patient tolerated treatment well   Behavior During Therapy [name] for tasks assessed/performed      Past Medical History:  Diagnosis Date  . Acid reflux   . Alcoholism ([place])   . Arthritis   . Fatty liver   . Gynecomastia, male 01/11/2013   Secondary to prostate ca Tx.   . H/O Bell's palsy   . Hemorrhoids    history  . High cholesterol   . History of back injury 11/19/2013  . Hyperglycemia   . Hyperlipidemia   . Hypertension   . Prostate cancer ([place])   . Prostate cancer ([place]) 06/20/2011  . Prostate carcinoma, recurrent ([place]) 08/10/2012    Past Surgical History:  Procedure Laterality Date  . [place]   left  . COLONOSCOPY  2008  . ESOPHAGOGASTRODUODENOSCOPY    . HERNIA REPAIR  2010  . I&D EXTREMITY Left 06/10/2015   Procedure: IRRIGATION AND DEBRIDEMENT EXTREMITY LEFT HAND EXPLORATION, nerve repair;  Surgeon: Charlotte Crumb, MD;  Location: [place];  Service: Orthopedics;  Laterality: Left;  . KNEE SURGERY     left  . PROSTATE BIOPSY  11/06    There were no vitals filed for this visit.      Subjective Assessment - 06/22/17 0900    Subjective Reports continued high level low back pain. Pain still approximately around R SI joint.   Pertinent History Prostate cancer (under treatment); Osteopenia.     Limitations Walking;Sitting   How long can you sit comfortably? 10-15 minutes.   How long can you stand comfortably? 10-15 minutes.   How  long can you walk comfortably? Short distances.   Diagnostic tests X-ray.     Patient Stated Goals Get out of pain.   Currently in Pain? Yes   Pain Score 7    Pain Orientation Right;Lower   Pain Descriptors / Indicators Sharp   Pain Type Chronic pain   Pain Onset More than a month ago   Pain Frequency Constant            [facility] PT Assessment - 06/22/17 0001      Assessment   Medical Diagnosis Low back pain with radiation to right leg.     Restrictions   Weight Bearing Restrictions No                     [facility] Adult PT Treatment/Exercise - 06/22/17 0001      Modalities   Modalities Electrical Stimulation     Electrical Stimulation   Electrical Stimulation Location R low back/ SI joint   Electrical Stimulation Action Pre-Mod   Electrical Stimulation Parameters 80-150 hz x15 min   Electrical Stimulation Goals Tone;Pain     Manual Therapy   Manual Therapy Soft tissue mobilization   Soft tissue mobilization STW/MFR to R lumbar paraspinals/QL/ glute into prone over one pillow to reduce tone and pain  PT Short Term Goals - 06/12/17 1333      PT SHORT TERM GOAL #1   Title STG's=LTG's.           PT Long Term Goals - 06/12/17 1333      PT LONG TERM GOAL #1   Title Independent with a HEP.   Time 6   Period Weeks   Status New     PT LONG TERM GOAL #2   Title Sit 30 minutes with pain not > 3/10.   Time 6   Period Weeks   Status New     PT LONG TERM GOAL #3   Title Eliminate right LE pain/symptoms.   Time 6   Period Weeks   Status New     PT LONG TERM GOAL #4   Title Perform ADL's with pain not > 3/10.   Time 6   Period Weeks   Status New               Plan - 06/22/17 0932    Clinical Impression Statement Patient presented in clinic with continued reports of high level R low back pain. Patient still sore and sensitive to palpation especially along R QL and SI joint region. Tightness palpable of R lumbar  paraspinals as well. Normal electrical stimulation response noted following removal of the modality.    Rehab Potential Fair   PT Frequency 2x / week   PT Duration 6 weeks   PT Treatment/Interventions ADLs/Self Care Home Management;Electrical Stimulation;Patient/family education;Therapeutic activities;Therapeutic exercise;Manual techniques   PT Next Visit Plan Continue with manual therapy as symptoms dictate and modalities per MPT POC.   Consulted and Agree with Plan of Care Patient      Patient will benefit from skilled therapeutic intervention in order to improve the following deficits and impairments:  Abnormal gait, Decreased activity tolerance, Decreased mobility, Pain  Visit Diagnosis: Chronic right-sided low back pain, with sciatica presence unspecified     Problem List Patient Active Problem List   Diagnosis Date Noted  . GERD (gastroesophageal reflux disease) 11/22/2016  . Urinary frequency 09/13/2016  . Knee pain, left 02/11/2016  . Prediabetes 01/27/2016  . Osteopenia determined by x-ray 02/01/2015  . Hypertension   . Hyperlipidemia   . Gynecomastia, male 01/11/2013  . Prostate cancer ([place]) 06/20/2011  . H N P-CERVICAL 10/05/2009  . CERVICAL SPASM 10/05/2009    Wynelle Fanny, PTA 06/22/2017, 9:50 AM  [facility] [place], [place], [address] Phone: [phone]   Fax:  [phone]  Name: NICOLAOS MITRANO MRN: [id number] Date of Birth: [birth year]

## 2017-06-27 ENCOUNTER — Encounter: Payer: Self-pay | Admitting: Physical Therapy

## 2017-06-27 ENCOUNTER — Ambulatory Visit: Payer: Medicare Other | Admitting: Physical Therapy

## 2017-06-27 DIAGNOSIS — G8929 Other chronic pain: Secondary | ICD-10-CM | POA: Diagnosis not present

## 2017-06-27 DIAGNOSIS — M545 Low back pain: Secondary | ICD-10-CM | POA: Diagnosis not present

## 2017-06-27 NOTE — Therapy (Signed)
[facility] [place], [place], [address] Phone: [phone]   Fax:  [phone]  Physical Therapy Treatment  Patient Details  Name: LEMOND GRIFFEE MRN: [id number] Date of Birth: 07/08/1951 Referring Provider: Rodell Perna MD  Encounter Date: 06/27/2017      PT End of Session - 06/27/17 1555    Visit Number 3   Number of Visits 12   Date for PT Re-Evaluation 07/24/17   PT Start Time 1518   PT Stop Time 1601   PT Time Calculation (min) 43 min   Activity Tolerance Patient tolerated treatment well;Patient limited by pain   Behavior During Therapy [facility] for tasks assessed/performed      Past Medical History:  Diagnosis Date  . Acid reflux   . Alcoholism ([place])   . Arthritis   . Fatty liver   . Gynecomastia, male 01/11/2013   Secondary to prostate ca Tx.   . H/O Bell's palsy   . Hemorrhoids    history  . High cholesterol   . History of back injury 11/19/2013  . Hyperglycemia   . Hyperlipidemia   . Hypertension   . Prostate cancer ([place])   . Prostate cancer ([place]) 06/20/2011  . Prostate carcinoma, recurrent ([place]) 08/10/2012    Past Surgical History:  Procedure Laterality Date  . [place]   left  . COLONOSCOPY  2008  . ESOPHAGOGASTRODUODENOSCOPY    . HERNIA REPAIR  2010  . I&D EXTREMITY Left 06/10/2015   Procedure: IRRIGATION AND DEBRIDEMENT EXTREMITY LEFT HAND EXPLORATION, nerve repair;  Surgeon: Charlotte Crumb, MD;  Location: [facility];  Service: Orthopedics;  Laterality: Left;  . KNEE SURGERY     left  . PROSTATE BIOPSY  11/06    There were no vitals filed for this visit.      Subjective Assessment - 06/27/17 1553    Subjective Reports no real improvement noted since beginning PT. Nothing has been found to help the pain and pain worse in the mornings and prior to bed.   Pertinent History Prostate cancer (under treatment); Osteopenia.     Limitations Walking;Sitting   How long can you sit  comfortably? 10-15 minutes.   How long can you stand comfortably? 10-15 minutes.   How long can you walk comfortably? Short distances.   Diagnostic tests X-ray.     Patient Stated Goals Get out of pain.   Currently in Pain? Yes   Pain Score 7    Pain Location Back   Pain Orientation Right;Lower   Pain Descriptors / Indicators Discomfort   Pain Type Chronic pain   Pain Onset More than a month ago            [facility] PT Assessment - 06/27/17 0001      Assessment   Medical Diagnosis Low back pain with radiation to right leg.     Restrictions   Weight Bearing Restrictions No                     [facility] Adult PT Treatment/Exercise - 06/27/17 0001      Exercises   Exercises Lumbar     Lumbar Exercises: Stretches   Passive Hamstring Stretch 3 reps;30 seconds   Single Knee to Chest Stretch 3 reps;30 seconds   Piriformis Stretch 3 reps;30 seconds     Lumbar Exercises: Supine   Ab Set 15 reps;5 seconds   Bent Knee Raise 10 reps  RLE only but stopped secondary to 7/10  rated pain     Modalities   Modalities Social worker Location R low back   Electrical Stimulation Action Pre-Mod   Electrical Stimulation Parameters 80-150 hz x15 min   Electrical Stimulation Goals Tone;Pain     Manual Therapy   Manual Therapy Soft tissue mobilization   Soft tissue mobilization STW/MFR to R lumbar paraspinals/QL/ glute in L SL with pillow between knees to reduce tone and pain                  PT Short Term Goals - 06/12/17 1333      PT SHORT TERM GOAL #1   Title STG's=LTG's.           PT Long Term Goals - 06/12/17 1333      PT LONG TERM GOAL #1   Title Independent with a HEP.   Time 6   Period Weeks   Status New     PT LONG TERM GOAL #2   Title Sit 30 minutes with pain not > 3/10.   Time 6   Period Weeks   Status New     PT LONG TERM GOAL #3   Title Eliminate right LE pain/symptoms.    Time 6   Period Weeks   Status New     PT LONG TERM GOAL #4   Title Perform ADL's with pain not > 3/10.   Time 6   Period Weeks   Status New               Plan - 06/27/17 1641    Clinical Impression Statement Patient tolerated today's treatment fair as no improvements noted by patient since beginning PT. Patient unable to determine any relieving factors at this time. Stretch sensation noted with all low back and hip stretches completed today. Patient instructed for core activation technique and low level core strengthening exercise which he reported 7/10 pain with supine marching thus exercise terminated. Patient presented with small areas of tightness along R lumbar paraspinals and QL today. Normal modality response noted following removal of the modality. Patient instructed to consult with MD regarding lack of progress and reduction of pain with PT.   Rehab Potential Fair   PT Frequency 2x / week   PT Duration 6 weeks   PT Treatment/Interventions ADLs/Self Care Home Management;Electrical Stimulation;Patient/family education;Therapeutic activities;Therapeutic exercise;Manual techniques   PT Next Visit Plan Route to Dr. Lorin Mercy for follow up appt   Consulted and Agree with Plan of Care Patient      Patient will benefit from skilled therapeutic intervention in order to improve the following deficits and impairments:  Abnormal gait, Decreased activity tolerance, Decreased mobility, Pain  Visit Diagnosis: Chronic right-sided low back pain, with sciatica presence unspecified     Problem List Patient Active Problem List   Diagnosis Date Noted  . GERD (gastroesophageal reflux disease) 11/22/2016  . Urinary frequency 09/13/2016  . Knee pain, left 02/11/2016  . Prediabetes 01/27/2016  . Osteopenia determined by x-ray 02/01/2015  . Hypertension   . Hyperlipidemia   . Gynecomastia, male 01/11/2013  . Prostate cancer ([place]) 06/20/2011  . H N P-CERVICAL 10/05/2009  . CERVICAL  SPASM 10/05/2009    Ahmed Prima, PTA 06/28/17 7:33 AM [place] MPT [facility] [place], [place], [address] Phone: [phone]   Fax:  [phone]  Name: RAFFAEL BUGARIN MRN: [id number] Date of Birth: 05/22/1951

## 2017-07-06 ENCOUNTER — Encounter (INDEPENDENT_AMBULATORY_CARE_PROVIDER_SITE_OTHER): Payer: Self-pay | Admitting: Orthopaedic Surgery

## 2017-07-06 ENCOUNTER — Ambulatory Visit (INDEPENDENT_AMBULATORY_CARE_PROVIDER_SITE_OTHER): Payer: Medicare Other | Admitting: Orthopaedic Surgery

## 2017-07-06 VITALS — BP 119/77 | HR 88 | Ht 66.0 in | Wt 210.0 lb

## 2017-07-06 DIAGNOSIS — G8929 Other chronic pain: Secondary | ICD-10-CM | POA: Diagnosis not present

## 2017-07-06 DIAGNOSIS — M5441 Lumbago with sciatica, right side: Secondary | ICD-10-CM | POA: Diagnosis not present

## 2017-07-06 NOTE — Progress Notes (Signed)
Office Visit Note   Patient: Walter Boone           Date of Birth: September 25, 1951           MRN: [id number] Visit Date: 07/06/2017              Requested by: Dettinger, Fransisca Kaufmann, MD [place], [place] [address] PCP: Dettinger, Fransisca Kaufmann, MD   Assessment & Plan: Visit Diagnoses:  1. Chronic right-sided low back pain with right-sided sciatica     Plan: Would recommend proceeding with lumbar MRI scan for comparison 2015 films. He has not responded to conservative treatment including therapy and is having persistent back and right leg pain. He has spondylolysis bilaterally at L5 on previous scan several years ago. He did have some disc degeneration at L4-5 as well.  Follow-Up Instructions: No Follow-up on file.   Orders:  Orders Placed This Encounter  Procedures  . MR Lumbar Spine w/o contrast   No orders of the defined types were placed in this encounter.     Procedures: No procedures performed   Clinical Data: No additional findings.   Subjective: Chief Complaint  Patient presents with  . Lower Back - Pain    HPI patient returns he been to a few weeks of therapy states his back still bothers him he has back pain that radiates into his right buttocks is significantly worse in the morning he states in the morning as difficulty even just walking. He has some osteoporosis and is on Prolene. He is undergoing treatment for his prostate cancer. Previous MRI 2015 showed bilateral pars defects at L5 and some disc degeneration L4-5.He denies associated bowel or bladder symptoms. He does have the diagnosis of prostate cancer associated treatment. He was stage IIC.  Review of Systems positive  prediabetes ,osteoporosis, prostate cancer, GERD, hypertension. Cervical spondylosis, prostate cancer with urinary frequency and chronic back pain otherwise negative as pertains is history of present illness 14 point uptake   Objective: Vital Signs: BP 119/77   Pulse 88   Ht 5'  6" (1.676 m)   Wt 210 lb (95.3 kg)   BMI 33.89 kg/m   Physical Exam  Constitutional: He is oriented to person, place, and time. He appears well-developed and well-nourished.  HENT:  Head: Normocephalic and atraumatic.  Eyes: Pupils are equal, round, and reactive to light. EOM are normal.  Neck: No tracheal deviation present. No thyromegaly present.  Cardiovascular: Normal rate.   Pulmonary/Chest: Effort normal. He has no wheezes.  Abdominal: Soft. Bowel sounds are normal.  Musculoskeletal:  Patient is able to take several steps with the walking and also on his heels. His balance is fair. He has pain with straight leg raising at 70 with sciatic notch tenderness on the right negative on the left, pain popliteal compression.  Neurological: He is alert and oriented to person, place, and time.  Skin: Skin is warm and dry. Capillary refill takes less than 2 seconds.  Psychiatric: He has a normal mood and affect. His behavior is normal. Judgment and thought content normal.    Ortho Exam deal ankle jerk are 1+ and symmetrical. He has palpable pulse posterior tib.  Specialty Comments:  No specialty comments available.  Imaging: No results found.   PMFS History: Patient Active Problem List   Diagnosis Date Noted  . Chronic right-sided low back pain with right-sided sciatica 07/06/2017  . GERD (gastroesophageal reflux disease) 11/22/2016  . Urinary frequency 09/13/2016  . Knee pain,  left 02/11/2016  . Prediabetes 01/27/2016  . Osteopenia determined by x-ray 02/01/2015  . Hypertension   . Hyperlipidemia   . Gynecomastia, male 01/11/2013  . Prostate cancer ([place]) 06/20/2011  . H N P-CERVICAL 10/05/2009  . CERVICAL SPASM 10/05/2009   Past Medical History:  Diagnosis Date  . Acid reflux   . Alcoholism ([place])   . Arthritis   . Fatty liver   . Gynecomastia, male 01/11/2013   Secondary to prostate ca Tx.   . H/O Bell's palsy   . Hemorrhoids    history  . High cholesterol   .  History of back injury 11/19/2013  . Hyperglycemia   . Hyperlipidemia   . Hypertension   . Prostate cancer ([place])   . Prostate cancer ([place]) 06/20/2011  . Prostate carcinoma, recurrent ([place]) 08/10/2012    Family History  Problem Relation Age of Onset  . Heart failure Mother   . Diabetes Father   . Cancer Sister   . Diabetes Brother     Past Surgical History:  Procedure Laterality Date  . [place]   left  . COLONOSCOPY  2008  . ESOPHAGOGASTRODUODENOSCOPY    . HERNIA REPAIR  2010  . I&D EXTREMITY Left 06/10/2015   Procedure: IRRIGATION AND DEBRIDEMENT EXTREMITY LEFT HAND EXPLORATION, nerve repair;  Surgeon: Charlotte Crumb, MD;  Location: [place];  Service: Orthopedics;  Laterality: Left;  . KNEE SURGERY     left  . PROSTATE BIOPSY  11/06   Social History   Occupational History  . Not on file.   Social History Main Topics  . Smoking status: Former Smoker    Packs/day: 2.50    Years: 3.00  . Smokeless tobacco: Never Used  . Alcohol use No     Comment: former drinker 20 years ago  . Drug use: No  . Sexual activity: Not on file

## 2017-07-11 ENCOUNTER — Other Ambulatory Visit (HOSPITAL_COMMUNITY): Payer: Self-pay | Admitting: *Deleted

## 2017-07-11 DIAGNOSIS — C61 Malignant neoplasm of prostate: Secondary | ICD-10-CM

## 2017-07-12 ENCOUNTER — Ambulatory Visit (HOSPITAL_COMMUNITY): Payer: Self-pay

## 2017-07-12 ENCOUNTER — Encounter (HOSPITAL_COMMUNITY): Payer: Medicare Other | Attending: Oncology

## 2017-07-12 ENCOUNTER — Encounter (HOSPITAL_BASED_OUTPATIENT_CLINIC_OR_DEPARTMENT_OTHER): Payer: Medicare Other | Admitting: Oncology

## 2017-07-12 ENCOUNTER — Other Ambulatory Visit (HOSPITAL_COMMUNITY): Payer: Self-pay

## 2017-07-12 ENCOUNTER — Telehealth (INDEPENDENT_AMBULATORY_CARE_PROVIDER_SITE_OTHER): Payer: Self-pay | Admitting: *Deleted

## 2017-07-12 ENCOUNTER — Encounter (HOSPITAL_BASED_OUTPATIENT_CLINIC_OR_DEPARTMENT_OTHER): Payer: Medicare Other

## 2017-07-12 VITALS — BP 128/89 | HR 83 | Temp 98.1°F | Resp 20 | Wt 209.0 lb

## 2017-07-12 DIAGNOSIS — Z809 Family history of malignant neoplasm, unspecified: Secondary | ICD-10-CM | POA: Insufficient documentation

## 2017-07-12 DIAGNOSIS — C61 Malignant neoplasm of prostate: Secondary | ICD-10-CM

## 2017-07-12 DIAGNOSIS — Z9221 Personal history of antineoplastic chemotherapy: Secondary | ICD-10-CM | POA: Diagnosis not present

## 2017-07-12 DIAGNOSIS — Z9889 Other specified postprocedural states: Secondary | ICD-10-CM | POA: Insufficient documentation

## 2017-07-12 DIAGNOSIS — R972 Elevated prostate specific antigen [PSA]: Secondary | ICD-10-CM | POA: Diagnosis not present

## 2017-07-12 DIAGNOSIS — M8580 Other specified disorders of bone density and structure, unspecified site: Secondary | ICD-10-CM | POA: Diagnosis present

## 2017-07-12 DIAGNOSIS — M545 Low back pain: Secondary | ICD-10-CM | POA: Diagnosis not present

## 2017-07-12 DIAGNOSIS — Z833 Family history of diabetes mellitus: Secondary | ICD-10-CM | POA: Diagnosis not present

## 2017-07-12 DIAGNOSIS — Z87891 Personal history of nicotine dependence: Secondary | ICD-10-CM | POA: Diagnosis not present

## 2017-07-12 DIAGNOSIS — Z8249 Family history of ischemic heart disease and other diseases of the circulatory system: Secondary | ICD-10-CM | POA: Diagnosis not present

## 2017-07-12 LAB — COMPREHENSIVE METABOLIC PANEL
ALK PHOS: 49 U/L (ref 38–126)
ALT: 19 U/L (ref 17–63)
ANION GAP: 10 (ref 5–15)
AST: 19 U/L (ref 15–41)
Albumin: 4.1 g/dL (ref 3.5–5.0)
BILIRUBIN TOTAL: 0.6 mg/dL (ref 0.3–1.2)
BUN: 17 mg/dL (ref 6–20)
CALCIUM: 9.1 mg/dL (ref 8.9–10.3)
CO2: 25 mmol/L (ref 22–32)
CREATININE: 0.79 mg/dL (ref 0.61–1.24)
Chloride: 103 mmol/L (ref 101–111)
GFR calc non Af Amer: >60 mL/min (ref >60)
Glucose, Bld: 115 mg/dL — ABNORMAL HIGH (ref 65–99)
Potassium: 3.8 mmol/L (ref 3.5–5.1)
Sodium: 138 mmol/L (ref 135–145)
TOTAL PROTEIN: 7.3 g/dL (ref 6.5–8.1)

## 2017-07-12 LAB — CBC WITH DIFFERENTIAL/PLATELET
Basophils Absolute: 0 10*3/uL (ref 0.0–0.1)
Basophils Relative: 1 %
EOS ABS: 0.4 10*3/uL (ref 0.0–0.7)
Eosinophils Relative: 5 %
HEMATOCRIT: 39 % (ref 39.0–52.0)
HEMOGLOBIN: 13.3 g/dL (ref 13.0–17.0)
LYMPHS ABS: 2.2 10*3/uL (ref 0.7–4.0)
LYMPHS PCT: 27 %
MCH: 31.4 pg (ref 26.0–34.0)
MCHC: 34.1 g/dL (ref 30.0–36.0)
MCV: 92.2 fL (ref 78.0–100.0)
Monocytes Absolute: 0.6 10*3/uL (ref 0.1–1.0)
Monocytes Relative: 8 %
NEUTROS ABS: 4.8 10*3/uL (ref 1.7–7.7)
NEUTROS PCT: 59 %
Platelets: 358 10*3/uL (ref 150–400)
RBC: 4.23 MIL/uL (ref 4.22–5.81)
RDW: 12.7 % (ref 11.5–15.5)
WBC: 8.1 10*3/uL (ref 4.0–10.5)

## 2017-07-12 LAB — PSA: Prostatic Specific Antigen: 3 ng/mL (ref 0.00–4.00)

## 2017-07-12 MED ORDER — DENOSUMAB 60 MG/ML ~~LOC~~ SOLN
60.0000 mg | Freq: Once | SUBCUTANEOUS | Status: AC
  Administered 2017-07-12: 60 mg via SUBCUTANEOUS
  Filled 2017-07-12: qty 1

## 2017-07-12 NOTE — Telephone Encounter (Signed)
Pt has appt MRI scheduled at [facility] on Wed Aug 8 at 9:00am, pt is to arrive 15 mins early to register, [facility] to pt for appt information.

## 2017-07-12 NOTE — Progress Notes (Signed)
Dettinger, Fransisca Kaufmann, [place] [id number]  Prostate cancer [facility]) - Plan: CBC with Differential, Comprehensive metabolic panel, PSA, Testosterone  CURRENT THERAPY: Depo-[name] every 3 months, Casodex daily.  Prolia every 6 months.  INTERVAL HISTORY: Walter Boone 66 y.o. male returns for followup of Stage IIC prostate cancer, S/P definitive XRT due to biochemical failure in 2012, started on [name] at that time. Casodex added in 2015.  More recently, PSA noted to climb and therefore casodex was discontinued in June 2017 in an attempt to drive PSA back down.  HOWEVER, his PSA continued to climb and doubled over 4 months.  Therefore, Casodex was re-instituted on 09/12/2016 in order to re-challenge for a response. Testosterone levels are in castrated range.  Depo-[name] every 3 months is continued.   Patient presents today for continued follow-up of his prostate cancer cancer. Patient complains of worsening of his chronic low back pain. He states that this is due to a birth defect which was exacerbated when he fell at work a few years ago. He states that recently his back pain has been getting worse, especially when he is sitting still or lying flat. Primary care physician has ordered a MRI of the back for him for further evaluation to see if he will potentially need surgery. Otherwise he has been doing well. He's been tolerating his [name] injections and Casodex as well. He does have hot flashes but they are not debilitating.  Review of Systems  Constitutional: Negative.  Negative for chills, fever, malaise/fatigue and weight loss.  HENT: Negative.   Eyes: Negative.   Respiratory: Negative.  Negative for cough.   Cardiovascular: Negative.  Negative for chest pain.  Gastrointestinal: Negative.  Negative for blood in stool, constipation, diarrhea, melena, nausea and vomiting.  Genitourinary: Negative.   Musculoskeletal: Positive for back pain. Negative for joint pain.   Skin: Negative.   Neurological: Negative.  Negative for weakness.  Endo/Heme/Allergies: Negative.   Psychiatric/Behavioral: Negative.     Past Medical History:  Diagnosis Date  . Acid reflux   . Alcoholism ([place])   . Arthritis   . Fatty liver   . Gynecomastia, male 01/11/2013   Secondary to prostate ca Tx.   . H/O Bell's palsy   . Hemorrhoids    history  . High cholesterol   . History of back injury 11/19/2013  . Hyperglycemia   . Hyperlipidemia   . Hypertension   . Prostate cancer ([place])   . Prostate cancer ([place]) 06/20/2011  . Prostate carcinoma, recurrent ([place]) 08/10/2012    Past Surgical History:  Procedure Laterality Date  . [place]   left  . COLONOSCOPY  2008  . ESOPHAGOGASTRODUODENOSCOPY    . HERNIA REPAIR  2010  . I&D EXTREMITY Left 06/10/2015   Procedure: IRRIGATION AND DEBRIDEMENT EXTREMITY LEFT HAND EXPLORATION, nerve repair;  Surgeon: Charlotte Crumb, MD;  Location: [place];  Service: Orthopedics;  Laterality: Left;  . KNEE SURGERY     left  . PROSTATE BIOPSY  11/06    Family History  Problem Relation Age of Onset  . Heart failure Mother   . Diabetes Father   . Cancer Sister   . Diabetes Brother     Social History   Social History  . Marital status: Married    Spouse name: N/A  . Number of children: N/A  . Years of education: N/A   Social History Main Topics  . Smoking status:  Former Smoker    Packs/day: 2.50    Years: 3.00  . Smokeless tobacco: Never Used  . Alcohol use No     Comment: former drinker 20 years ago  . Drug use: No  . Sexual activity: Not on file   Other Topics Concern  . Not on file   Social History Narrative  . No narrative on file     PHYSICAL EXAMINATION  ECOG PERFORMANCE STATUS: 0 - Asymptomatic  Vitals:   07/12/17 1002  BP: 128/89  Pulse: 83  Resp: 20  Temp: 98.1 F (36.7 C)    GENERAL:alert, no distress, well nourished, well developed, comfortable, cooperative, smiling and unaccompanied SKIN:  skin color, texture, turgor are normal, no rashes or significant lesions HEAD: Normocephalic, No masses, lesions, tenderness or abnormalities EYES: normal, EOMI, Conjunctiva are pink and non-injected EARS: External ears normal OROPHARYNX:lips, buccal mucosa, and tongue normal and mucous membranes are moist  NECK: supple, no adenopathy, trachea midline LYMPH:  no palpable lymphadenopathy LUNGS: clear to auscultation and percussion HEART: regular rate & rhythm, no murmurs, no gallops, S1 normal and S2 normal ABDOMEN:abdomen soft, non-tender and normal bowel sounds BACK: Back symmetric, no curvature. EXTREMITIES:less then 2 second capillary refill, no skin discoloration, no cyanosis. NEURO: alert & oriented x 3 with fluent speech, no focal motor/sensory deficits, gait normal   LABORATORY DATA: CBC    Component Value Date/Time   WBC 8.1 07/12/2017 0930   RBC 4.23 07/12/2017 0930   HGB 13.3 07/12/2017 0930   HGB 12.4 (L) 05/19/2016 1623   HCT 39.0 07/12/2017 0930   HCT 36.1 (L) 05/19/2016 1623   PLT 358 07/12/2017 0930   PLT 369 05/19/2016 1623   MCV 92.2 07/12/2017 0930   MCV 90 05/19/2016 1623   MCH 31.4 07/12/2017 0930   MCHC 34.1 07/12/2017 0930   RDW 12.7 07/12/2017 0930   RDW 13.3 05/19/2016 1623   LYMPHSABS 2.2 07/12/2017 0930   LYMPHSABS 2.3 05/19/2016 1623   MONOABS 0.6 07/12/2017 0930   EOSABS 0.4 07/12/2017 0930   EOSABS 0.3 05/19/2016 1623   BASOSABS 0.0 07/12/2017 0930   BASOSABS 0.0 05/19/2016 1623      Chemistry      Component Value Date/Time   NA 138 07/12/2017 0930   NA 140 11/11/2015 1547   K 3.8 07/12/2017 0930   CL 103 07/12/2017 0930   CO2 25 07/12/2017 0930   BUN 17 07/12/2017 0930   BUN 23 11/11/2015 1547   CREATININE 0.79 07/12/2017 0930   GLU 97 04/22/2016      Component Value Date/Time   CALCIUM 9.1 07/12/2017 0930   ALKPHOS 49 07/12/2017 0930   AST 19 07/12/2017 0930   ALT 19 07/12/2017 0930   BILITOT 0.6 07/12/2017 0930   BILITOT  <0.2 11/11/2015 1547           ASSESSMENT AND PLAN:  Stage IIC prostate cancer, S/P definitive XRT due to biochemical failure in 2012, started on [name] at that time. Casodex added in 2015.  More recently, PSA noted to climb and therefore casodex was discontinued in June 2017 in an attempt to drive PSA back down.  HOWEVER, his PSA continued to climb and doubled over 4 months.  Therefore, Casodex was re-instituted on 09/12/2016 in order to re-challenge for a response. Testosterone levels are in castrated range.  Depo-[name] every 3 months is continued.  PLAN: Continue lupron q3 months and daily casodex. PSA has been trending down. Awaiting PSA level from today. Continue proliq  q6 months. RTC in 3 months for follow up.   ORDERS PLACED FOR THIS ENCOUNTER: Orders Placed This Encounter  Procedures  . CBC with Differential  . Comprehensive metabolic panel  . PSA  . Testosterone    MEDICATIONS PRESCRIBED THIS ENCOUNTER:  THERAPY PLAN:  Continue [name] every 3 months, Casodex daily, and Prolia every 6 months.  All questions were answered. The patient knows to call the clinic with any problems, questions or concerns. We can certainly see the patient much sooner if necessary.    This note is electronically signed by: Twana First, MD 07/12/2017 11:08 AM

## 2017-07-12 NOTE — Patient Instructions (Signed)
[facility] at [facility] Discharge Instructions  RECOMMENDATIONS MADE BY THE CONSULTANT AND ANY TEST RESULTS WILL BE SENT TO YOUR REFERRING PHYSICIAN.    Thank you for choosing [facility] at [facility] to provide your oncology and hematology care.  To afford each patient quality time with our provider, please arrive at least 15 minutes before your scheduled appointment time.    If you have a lab appointment with the Cancer Center please come in thru the  Main Entrance and check in at the main information desk  You need to re-schedule your appointment should you arrive 10 or more minutes late.  We strive to give you quality time with our providers, and arriving late affects you and other patients whose appointments are after yours.  Also, if you no show three or more times for appointments you may be dismissed from the clinic at the providers discretion.     Again, thank you for choosing [facility].  Our hope is that these requests will decrease the amount of time that you wait before being seen by our physicians.       _____________________________________________________________  Should you have questions after your visit to [facility], please contact our office at [phone] between the hours of 8:30 a.m. and 4:30 p.m.  Voicemails left after 4:30 p.m. will not be returned until the following business day.  For prescription refill requests, have your pharmacy contact our office.       Resources For Cancer Patients and their Caregivers ? American Cancer Society: Can assist with transportation, wigs, general needs, runs Look Good Feel Better.        1-888-227-6333 ? Cancer Care: Provides financial assistance, online support groups, medication/co-pay assistance.  1-800-813-HOPE (4673) ? [facility] Assists [place] cancer patients and their families through emotional , educational  and financial support.  336-427-4357 ? [place] DSS Where to apply for food stamps, Medicaid and utility assistance. 336-342-1394 ? [facility]: Transportation to medical appointments. 336-347-2287 ? Social Security Administration: May apply for disability if have a Stage IV cancer. 336-342-7796 1-800-772-1213 ? [place] Aging, Disability and Transit Services: Assists with nutrition, care and transit needs. 336-349-2343  Cancer Center Support Programs: @10RELATIVEDAYS@ > Cancer Support Group  2nd Tuesday of the month 1pm-2pm, Journey Room  > Creative Journey  3rd Tuesday of the month 1130am-1pm, Journey Room  > Look Good Feel Better  1st Wednesday of the month 10am-12 noon, Journey Room (Call American Cancer Society to register 1-800-395-5775)   

## 2017-07-12 NOTE — Progress Notes (Signed)
Walter Boone presents today for injection per the provider's orders.  Prolia administration without incident; see MAR for injection details.  Patient tolerated procedure well and without incident.  No questions or complaints noted at this time.  Discharged ambulatory.

## 2017-07-14 NOTE — Telephone Encounter (Signed)
Called pt and he is aware of appt

## 2017-07-19 ENCOUNTER — Ambulatory Visit (HOSPITAL_COMMUNITY)
Admission: RE | Admit: 2017-07-19 | Discharge: 2017-07-19 | Disposition: A | Discharge status: Home / Self Care | Payer: Medicare Other | Source: Ambulatory Visit | Attending: Orthopaedic Surgery | Admitting: Orthopaedic Surgery

## 2017-07-19 DIAGNOSIS — M5126 Other intervertebral disc displacement, lumbar region: Secondary | ICD-10-CM | POA: Insufficient documentation

## 2017-07-19 DIAGNOSIS — M47816 Spondylosis without myelopathy or radiculopathy, lumbar region: Secondary | ICD-10-CM | POA: Diagnosis not present

## 2017-07-19 DIAGNOSIS — G8929 Other chronic pain: Secondary | ICD-10-CM | POA: Insufficient documentation

## 2017-07-19 DIAGNOSIS — M5125 Other intervertebral disc displacement, thoracolumbar region: Secondary | ICD-10-CM | POA: Diagnosis not present

## 2017-07-19 DIAGNOSIS — M5441 Lumbago with sciatica, right side: Secondary | ICD-10-CM | POA: Insufficient documentation

## 2017-07-19 DIAGNOSIS — M5136 Other intervertebral disc degeneration, lumbar region: Secondary | ICD-10-CM | POA: Diagnosis not present

## 2017-07-19 DIAGNOSIS — M5124 Other intervertebral disc displacement, thoracic region: Secondary | ICD-10-CM | POA: Diagnosis not present

## 2017-07-24 ENCOUNTER — Ambulatory Visit (INDEPENDENT_AMBULATORY_CARE_PROVIDER_SITE_OTHER): Payer: Medicare Other | Admitting: Orthopaedic Surgery

## 2017-07-27 ENCOUNTER — Ambulatory Visit (INDEPENDENT_AMBULATORY_CARE_PROVIDER_SITE_OTHER): Payer: Medicare Other | Admitting: Orthopaedic Surgery

## 2017-07-27 ENCOUNTER — Encounter (INDEPENDENT_AMBULATORY_CARE_PROVIDER_SITE_OTHER): Payer: Self-pay | Admitting: Orthopaedic Surgery

## 2017-07-27 VITALS — BP 125/75 | HR 84

## 2017-07-27 DIAGNOSIS — G8929 Other chronic pain: Secondary | ICD-10-CM | POA: Diagnosis not present

## 2017-07-27 DIAGNOSIS — M5441 Lumbago with sciatica, right side: Secondary | ICD-10-CM

## 2017-07-27 NOTE — Progress Notes (Signed)
Office Visit Note   Patient: Walter Boone           Date of Birth: 10-11-1951           MRN: [id number] Visit Date: 07/27/2017              Requested by: Dettinger, Fransisca Kaufmann, MD [place], [place] [id number] PCP: Dettinger, Fransisca Kaufmann, MD   Assessment & Plan: Visit Diagnoses:  1. Chronic right-sided low back pain with right-sided sciatica     Plan: Continue Norco walking program. Continue to work on weight loss and core strengthening. He developed progressive radicular symptoms he will return. We reviewed the MRI given a copy of the report. He does not have any areas of significant compression. He does have some anterolisthesis at L4-5 degenerative in nature and pars defect at L5 bilaterally but no anterolisthesis at L5-S1.  Follow-Up Instructions: No Follow-up on file.   Orders:  No orders of the defined types were placed in this encounter.  No orders of the defined types were placed in this encounter.     Procedures: No procedures performed   Clinical Data: No additional findings.   Subjective: Chief Complaint  Patient presents with  . Lower Back - Pain    HPI 66 year old male returns with ongoing chronic back pain symptoms. MRI scan has been obtained and is available for review. On anti-inflammatories and muscle relaxant. He gradually lost about 50 pounds over a few years.  Review of Systems 14 point review of systems updated. Of note his history of prostate cancer, hypertension and bilateral L5 pars defects otherwise negative as it pertains to history of present illness updated from 07/06/2017    Objective: Vital Signs: BP 125/75   Pulse 84   Physical Exam  Constitutional: He is oriented to person, place, and time. He appears well-developed and well-nourished.  HENT:  Head: Normocephalic and atraumatic.  Eyes: Pupils are equal, round, and reactive to light. EOM are normal.  Neck: No tracheal deviation present. No thyromegaly present.    Cardiovascular: Normal rate.   Pulmonary/Chest: Effort normal. He has no wheezes.  Abdominal: Soft. Bowel sounds are normal.  Musculoskeletal:  He'll toe gait negative straight leg raising. Anterior tib EHL is strong. Distal pulses are intact.  Neurological: He is alert and oriented to person, place, and time.  Skin: Skin is warm and dry. Capillary refill takes less than 2 seconds.  Psychiatric: He has a normal mood and affect. His behavior is normal. Judgment and thought content normal.    Ortho Exam pain with straight leg raising at 70. Sciatic notch tenderness present on the right negative on the left.  Specialty Comments:  No specialty comments available.  Imaging: Study Result   CLINICAL DATA:  Low back and bilateral leg pain for 3 months. History of prostate cancer.  EXAM: MRI LUMBAR SPINE WITHOUT CONTRAST  TECHNIQUE: Multiplanar, multisequence MR imaging of the lumbar spine was performed. No intravenous contrast was administered.  FINDINGS: Segmentation: 5 lumbar type vertebral bodies. The last full intervertebral disc space is labeled L5-S1.  Alignment: Degenerative of lumbar spondylosis with mild non isthmic anterolisthesis of L4. There are bilateral pars defects at L5 with minimal spondylolisthesis.  Vertebrae:  No fracture or bone lesion.  Conus medullaris: Extends to the bottom of T12. Level and appears normal.  Paraspinal and other soft tissues: No significant findings. Normal aortic caliber and no retroperitoneal mass.  Disc levels:  T11-12: No axial images through this  level but there is a small focal disc extrusion just above the disc space level with mild mass effect on the thecal sac.  T12-L1: Mild disc disease and facet disease. There is a small focal annular fissure and tiny right foraminal disc protrusion without definite neural compression.  L1-2: Mild bulging annulus with mild bilateral lateral recess encroachment but no  significant spinal or foraminal stenosis.  L2-3: Mild diffuse annular bulge. Mild bilateral lateral recess encroachment but no significant spinal or foraminal stenosis.  L3-4: Mild facet disease but no disc protrusions, spinal or foraminal stenosis.  L4-5: Degenerative anterolisthesis of L4 with a bulging uncovered disc. Minimal impression on the ventral thecal sac. No foraminal stenosis. Moderate bilateral facet disease.  L5-S1: Bilateral pars defects with severe facet disease. Small synovial cyst on the right side is projecting posteriorly. There is ligamentum flavum thickening and mild mass effect on both sides of the thecal sac posterolaterally. No foraminal stenosis.  IMPRESSION: 1. Small focal disc extrusion at T11-12. 2. Annular fissure and tiny right foraminal disc protrusion at T12-L1. 3. Mild bilateral lateral recess encroachment at L1-2 and L2-3. 4. Degenerative anterolisthesis of L4 with a bulging uncovered disc but no significant spinal or foraminal stenosis. 5. Bilateral pars defects at L5 with minimal anterolisthesis. Severe facet disease and ligamentum flavum thickening with mild mass effect on both sides of the thecal sac posterolaterally.   Electronically Signed   By: Marijo Sanes M.D.   On: 07/19/2017 10:36      PMFS History: Patient Active Problem List   Diagnosis Date Noted  . Chronic right-sided low back pain with right-sided sciatica 07/06/2017  . GERD (gastroesophageal reflux disease) 11/22/2016  . Urinary frequency 09/13/2016  . Knee pain, left 02/11/2016  . Prediabetes 01/27/2016  . Osteopenia determined by x-ray 02/01/2015  . Hypertension   . Hyperlipidemia   . Gynecomastia, male 01/11/2013  . Prostate cancer ([place]) 06/20/2011  . H N P-CERVICAL 10/05/2009  . CERVICAL SPASM 10/05/2009   Past Medical History:  Diagnosis Date  . Acid reflux   . Alcoholism ([place])   . Arthritis   . Fatty liver   . Gynecomastia, male 01/11/2013    Secondary to prostate ca Tx.   . H/O Bell's palsy   . Hemorrhoids    history  . High cholesterol   . History of back injury 11/19/2013  . Hyperglycemia   . Hyperlipidemia   . Hypertension   . Prostate cancer ([place])   . Prostate cancer ([place]) 06/20/2011  . Prostate carcinoma, recurrent ([place]) 08/10/2012    Family History  Problem Relation Age of Onset  . Heart failure Mother   . Diabetes Father   . Cancer Sister   . Diabetes Brother     Past Surgical History:  Procedure Laterality Date  . [place]   left  . COLONOSCOPY  2008  . ESOPHAGOGASTRODUODENOSCOPY    . HERNIA REPAIR  2010  . I&D EXTREMITY Left 06/10/2015   Procedure: IRRIGATION AND DEBRIDEMENT EXTREMITY LEFT HAND EXPLORATION, nerve repair;  Surgeon: Charlotte Crumb, MD;  Location: [facility];  Service: Orthopedics;  Laterality: Left;  . KNEE SURGERY     left  . PROSTATE BIOPSY  11/06   Social History   Occupational History  . Not on file.   Social History Main Topics  . Smoking status: Former Smoker    Packs/day: 2.50    Years: 3.00  . Smokeless tobacco: Never Used  . Alcohol use No  Comment: former drinker 20 years ago  . Drug use: No  . Sexual activity: Not on file

## 2017-07-30 ENCOUNTER — Emergency Department (HOSPITAL_COMMUNITY)
Admission: EM | Admit: 2017-07-30 | Discharge: 2017-07-30 | Disposition: A | Discharge status: Home / Self Care | Payer: Medicare Other | Attending: Emergency Medicine | Admitting: Emergency Medicine

## 2017-07-30 ENCOUNTER — Emergency Department (HOSPITAL_COMMUNITY): Payer: Medicare Other

## 2017-07-30 ENCOUNTER — Encounter (HOSPITAL_COMMUNITY): Payer: Self-pay | Admitting: Emergency Medicine

## 2017-07-30 DIAGNOSIS — L03012 Cellulitis of left finger: Secondary | ICD-10-CM | POA: Diagnosis not present

## 2017-07-30 DIAGNOSIS — Z79899 Other long term (current) drug therapy: Secondary | ICD-10-CM | POA: Diagnosis not present

## 2017-07-30 DIAGNOSIS — M79645 Pain in left finger(s): Secondary | ICD-10-CM | POA: Diagnosis present

## 2017-07-30 DIAGNOSIS — Z87891 Personal history of nicotine dependence: Secondary | ICD-10-CM | POA: Insufficient documentation

## 2017-07-30 DIAGNOSIS — I10 Essential (primary) hypertension: Secondary | ICD-10-CM | POA: Insufficient documentation

## 2017-07-30 DIAGNOSIS — M7989 Other specified soft tissue disorders: Secondary | ICD-10-CM | POA: Diagnosis not present

## 2017-07-30 MED ORDER — LIDOCAINE HCL (PF) 2 % IJ SOLN
INTRAMUSCULAR | Status: AC
  Filled 2017-07-30: qty 10

## 2017-07-30 MED ORDER — TRAMADOL HCL 50 MG PO TABS: 50.0000 mg | ORAL_TABLET | Freq: Four times a day (QID) | ORAL | 0 refills | Status: DC | PRN

## 2017-07-30 MED ORDER — OXYCODONE HCL 5 MG PO TABS
5.0000 mg | ORAL_TABLET | Freq: Once | ORAL | Status: AC
  Administered 2017-07-30: 5 mg via ORAL
  Filled 2017-07-30: qty 1

## 2017-07-30 MED ORDER — CEPHALEXIN 500 MG PO CAPS: 500.0000 mg | ORAL_CAPSULE | Freq: Four times a day (QID) | ORAL | 0 refills | Status: DC

## 2017-07-30 MED ORDER — LIDOCAINE HCL (PF) 2 % IJ SOLN
5.0000 mL | Freq: Once | INTRAMUSCULAR | Status: AC
  Administered 2017-07-30: 5 mL via INTRADERMAL

## 2017-07-30 MED ORDER — CEPHALEXIN 500 MG PO CAPS
500.0000 mg | ORAL_CAPSULE | Freq: Once | ORAL | Status: AC
  Administered 2017-07-30: 500 mg via ORAL
  Filled 2017-07-30: qty 1

## 2017-07-30 MED ORDER — POVIDONE-IODINE 10 % EX SOLN
CUTANEOUS | Status: AC
  Administered 2017-07-30: 23:00:00
  Filled 2017-07-30: qty 15

## 2017-07-30 NOTE — Discharge Instructions (Signed)
Warm water soaks on/off to your finger.  Keep your hand elevated when possible.  Do not squeeze it.  Return here for any worsening symptoms such as continued pain, increased redness, or red streaking

## 2017-07-30 NOTE — ED Triage Notes (Signed)
Pt c/o left index finger swelling and pain around the nail x 2 days. Pt states he has been squeezing pus out of the area.

## 2017-07-30 NOTE — ED Provider Notes (Signed)
[facility] DEPT Provider Note   CSN: [id number] Arrival date & time: 07/30/17  2044     History   Chief Complaint Chief Complaint  Patient presents with  . Hand Pain    HPI Walter Boone is a 66 y.o. male.  HPI   Walter Boone is a 66 y.o. male who presents to the Emergency Department complaining of pain, redness and swelling to the distal end of the left index finger.  Symptoms present for 2 days.  He states that he tried to "prick" the area with a needle and drained some pus from it, but it has continued to swell and throb.  He denies known foreign body, numbness, fever, chills or pain to the proximal finger.    Past Medical History:  Diagnosis Date  . Acid reflux   . Alcoholism ([place])   . Arthritis   . Fatty liver   . Gynecomastia, male 01/11/2013   Secondary to prostate ca Tx.   . H/O Bell's palsy   . Hemorrhoids    history  . High cholesterol   . History of back injury 11/19/2013  . Hyperglycemia   . Hyperlipidemia   . Hypertension   . Prostate cancer ([place])   . Prostate cancer ([place]) 06/20/2011  . Prostate carcinoma, recurrent ([place]) 08/10/2012    Patient Active Problem List   Diagnosis Date Noted  . Chronic right-sided low back pain with right-sided sciatica 07/06/2017  . GERD (gastroesophageal reflux disease) 11/22/2016  . Urinary frequency 09/13/2016  . Knee pain, left 02/11/2016  . Prediabetes 01/27/2016  . Osteopenia determined by x-ray 02/01/2015  . Hypertension   . Hyperlipidemia   . Gynecomastia, male 01/11/2013  . Prostate cancer ([place]) 06/20/2011  . H N P-CERVICAL 10/05/2009  . CERVICAL SPASM 10/05/2009    Past Surgical History:  Procedure Laterality Date  . [place]   left  . COLONOSCOPY  2008  . ESOPHAGOGASTRODUODENOSCOPY    . HERNIA REPAIR  2010  . I&D EXTREMITY Left 06/10/2015   Procedure: IRRIGATION AND DEBRIDEMENT EXTREMITY LEFT HAND EXPLORATION, nerve repair;  Surgeon: Charlotte Crumb, MD;  Location: [facility];   Service: Orthopedics;  Laterality: Left;  . KNEE SURGERY     left  . PROSTATE BIOPSY  11/06       Home Medications    Prior to Admission medications   Medication Sig Start Date End Date Taking? Authorizing Provider  amLODipine (NORVASC) 10 MG tablet Take 1 tablet (10 mg total) by mouth daily. 04/11/17   Dettinger, Fransisca Kaufmann, MD  bicalutamide (CASODEX) 50 MG tablet Take 1 tablet (50 mg total) by mouth daily. 04/11/17   Dettinger, Fransisca Kaufmann, MD  cephALEXin (KEFLEX) 500 MG capsule Take 1 capsule (500 mg total) by mouth 4 (four) times daily. For 7 days 07/30/17   Kem Parkinson, PA-C  Cholecalciferol (VITAMIN D-3) 1000 units CAPS Take by mouth.    [provider]  denosumab (PROLIA) 60 MG/ML SOLN injection Inject 60 mg into the skin every 6 (six) months. Administer in upper arm, thigh, or abdomen    [provider]  diclofenac (VOLTAREN) 75 MG EC tablet Take 1 tablet (75 mg total) by mouth 2 (two) times daily. 05/17/17   Dettinger, Fransisca Kaufmann, MD  diphenhydrAMINE (BENADRYL) 25 MG tablet Take 25 mg by mouth every 6 (six) hours as needed.    [provider]  HYDROcodone-acetaminophen (NORCO/VICODIN) 5-325 MG tablet Take 1 tablet by mouth every 6 (six) hours as needed for  moderate pain. 04/11/17   Dettinger, Fransisca Kaufmann, MD  Leuprolide Acetate ([name] IJ) Inject as directed every 3 (three) months.    [provider]  meloxicam (MOBIC) 15 MG tablet Take 1 tablet (15 mg total) by mouth daily. 05/24/17   Dettinger, Fransisca Kaufmann, MD  methocarbamol (ROBAXIN) 500 MG tablet Take 1 tablet (500 mg total) by mouth 4 (four) times daily. 05/17/17   Dettinger, Fransisca Kaufmann, MD  traMADol (ULTRAM) 50 MG tablet Take 1 tablet (50 mg total) by mouth every 6 (six) hours as needed. 07/30/17   Kem Parkinson, PA-C    Family History Family History  Problem Relation Age of Onset  . Heart failure Mother   . Diabetes Father   . Cancer Sister   . Diabetes Brother     Social History Social History    Substance Use Topics  . Smoking status: Former Smoker    Packs/day: 2.50    Years: 3.00  . Smokeless tobacco: Never Used  . Alcohol use No     Comment: former drinker 20 years ago     Allergies   Tylenol [acetaminophen]   Review of Systems Review of Systems  Constitutional: Negative for chills and fever.  Musculoskeletal: Positive for arthralgias (left index finger pain and swelling).  Skin: Positive for color change. Negative for wound.  Neurological: Negative for weakness and numbness.  All other systems reviewed and are negative.    Physical Exam Updated Vital Signs BP (!) 144/84 (BP Location: Right Arm)   Pulse 88   Temp 97.7 F (36.5 C) (Oral)   Resp 18   Ht 5\' 5"  (1.651 m)   Wt 94.3 kg (208 lb)   SpO2 95%   BMI 34.61 kg/m   Physical Exam  Constitutional: He is oriented to person, place, and time. He appears well-developed and well-nourished. No distress.  HENT:  Head: Normocephalic.  Cardiovascular: Normal rate, regular rhythm and intact distal pulses.   Pulmonary/Chest: Effort normal and breath sounds normal. No respiratory distress.  Musculoskeletal: Normal range of motion. He exhibits edema and tenderness. He exhibits no deformity.  Neurological: He is alert and oriented to person, place, and time. No sensory deficit.  Skin: Skin is warm. Capillary refill takes less than 2 seconds.  Focal edema, erythema to the radial aspect of the left index finger.  Erythema extends to the lateral eponychium with mild fluctuance.    Nursing note and vitals reviewed.    ED Treatments / Results  Labs (all labs ordered are listed, but only abnormal results are displayed) Labs Reviewed - No data to display  EKG  EKG Interpretation None       Radiology Dg Finger Index Left  Result Date: 07/30/2017 CLINICAL DATA:  Left index finger swelling and pain x2 days near the nail bed. EXAM: LEFT INDEX FINGER 2+V COMPARISON:  03/11/2015 FINDINGS: Soft tissue swelling and  thickening is noted about the distal phalanx of the left index finger without underlying bone destruction. Small enthesophyte is seen along the ulnar aspect of the DIP joint. Probable small capsular calcification is seen of the PIP joint, stable in appearance. Mild degenerative joint space narrowing at the base of the metacarpal and triscaphe joints. No radiopaque foreign body identified. IMPRESSION: Nonspecific soft tissue thickening and swelling of the left index finger likely to represent cellulitis. No underlying bone destruction or fracture. Electronically Signed   By: Ashley Royalty M.D.   On: 07/30/2017 22:22    Procedures Procedures (including critical care time)  INCISION AND DRAINAGE Performed by: Hale Bogus. Consent: Verbal consent obtained. Risks and benefits: risks, benefits and alternatives were discussed Type: paronychia  Body area: left index finger  Anesthesia: digital nerve block Single stab Incision was made with a #11 scalpel.  Local anesthetic: lidocaine 2 % w/o epinephrine  Anesthetic total: 2 ml  Complexity: simple  Drainage: purulent  Drainage amount: small   Patient tolerance: Patient tolerated the procedure well with no immediate complications.    Medications Ordered in ED Medications  povidone-iodine (BETADINE) 10 % external solution (not administered)  cephALEXin (KEFLEX) capsule 500 mg (not administered)  oxyCODONE (Oxy IR/ROXICODONE) immediate release tablet 5 mg (not administered)  lidocaine (XYLOCAINE) 2 % injection 5 mL (5 mLs Intradermal Given 07/30/17 2155)     Initial Impression / Assessment and Plan / ED Course  I have reviewed the triage vital signs and the nursing notes.  Pertinent labs & imaging results that were available during my care of the patient were reviewed by me and considered in my medical decision making (see chart for details).     No obvious FB of the skin.  Sx's localized to the radial aspect of the finger.   Likely paronychia.  NV intact.  No lymphangitis.    Pt agrees to warm soaks, elevate ER return if needed.   Final Clinical Impressions(s) / ED Diagnoses   Final diagnoses:  Paronychia of left index finger    New Prescriptions New Prescriptions   CEPHALEXIN (KEFLEX) 500 MG CAPSULE    Take 1 capsule (500 mg total) by mouth 4 (four) times daily. For 7 days   TRAMADOL (ULTRAM) 50 MG TABLET    Take 1 tablet (50 mg total) by mouth every 6 (six) hours as needed.     Kem Parkinson, PA-C 07/30/17 2310    Noemi Chapel, MD 07/30/17 [phone]

## 2017-08-09 ENCOUNTER — Encounter: Payer: Self-pay | Admitting: Family Medicine

## 2017-08-09 ENCOUNTER — Ambulatory Visit (INDEPENDENT_AMBULATORY_CARE_PROVIDER_SITE_OTHER): Payer: Medicare Other | Admitting: Family Medicine

## 2017-08-09 VITALS — BP 138/85 | HR 81 | Temp 98.0°F | Ht 65.0 in | Wt 206.0 lb

## 2017-08-09 DIAGNOSIS — M5441 Lumbago with sciatica, right side: Secondary | ICD-10-CM

## 2017-08-09 DIAGNOSIS — G8929 Other chronic pain: Secondary | ICD-10-CM | POA: Diagnosis not present

## 2017-08-09 MED ORDER — TRAMADOL HCL 50 MG PO TABS: 50.0000 mg | ORAL_TABLET | Freq: Two times a day (BID) | ORAL | 2 refills | Status: DC | PRN

## 2017-08-09 NOTE — Progress Notes (Signed)
   BP 138/85   Pulse 81   Temp 98 F (36.7 C) (Oral)   Ht 5\' 5"  (1.651 m)   Wt 206 lb (93.4 kg)   BMI 34.28 kg/m    Subjective:    [name], male    DOB: 13-May-1951, 66 y.o.   MRN: [id number]  HPI: Walter Boone is a 66 y.o. male presenting on 08/09/2017 for Back Pain (refill Norco, discuss Ultram)   HPI Chronic back pain, discussed tramadol Patient is coming in today for chronic low back pain for which she uses an anti-inflammatory and muscle relaxer and then when he gets really bad was using Norco but recently he went to the emergency department was given some tramadol and has been using that and feels like tramadol works better and he would like to do tramadol instead of [place]. He says his back pain for the most part is controlled and he does not have to use narcotics every day but every 2-3 days he uses 1.  Relevant past medical, surgical, family and social history reviewed and updated as indicated. Interim medical history since our last visit reviewed. Allergies and medications reviewed and updated.  Review of Systems  Constitutional: Negative for chills and fever.  Eyes: Negative for discharge.  Respiratory: Negative for shortness of breath and wheezing.   Cardiovascular: Negative for chest pain and leg swelling.  Musculoskeletal: Positive for back pain. Negative for gait problem.  Skin: Negative for rash.  All other systems reviewed and are negative.   Per HPI unless specifically indicated above     Objective:    BP 138/85   Pulse 81   Temp 98 F (36.7 C) (Oral)   Ht 5\' 5"  (1.651 m)   Wt 206 lb (93.4 kg)   BMI 34.28 kg/m   Wt Readings from Last 3 Encounters:  08/09/17 206 lb (93.4 kg)  07/30/17 208 lb (94.3 kg)  07/12/17 209 lb (94.8 kg)    Physical Exam  Constitutional: He is oriented to person, place, and time. He appears well-developed and well-nourished. No distress.  Eyes: Conjunctivae are normal. No scleral icterus.    Cardiovascular: Normal rate, regular rhythm, normal heart sounds and intact distal pulses.   No murmur heard. Pulmonary/Chest: Effort normal and breath sounds normal. No respiratory distress. He has no wheezes. He has no rales.  Musculoskeletal: Normal range of motion. He exhibits tenderness (Low back pain in a bandlike area across lower back.). He exhibits no edema.  Neurological: He is alert and oriented to person, place, and time. Coordination normal.  Skin: Skin is warm and dry. No rash noted. He is not diaphoretic.  Psychiatric: He has a normal mood and affect. His behavior is normal.  Nursing note and vitals reviewed.      Assessment & Plan:   Problem List Items Addressed This Visit      Nervous and Auditory   Chronic right-sided low back pain with right-sided sciatica - Primary   Relevant Medications   traMADol (ULTRAM) 50 MG tablet       Follow up plan: Return in about 3 months (around 11/09/2017), or if symptoms worsen or fail to improve, for Recheck chronic pain.  Counseling provided for all of the vaccine components No orders of the defined types were placed in this encounter.   Caryl Pina, MD [facility] 08/09/2017, 3:34 PM

## 2017-08-29 ENCOUNTER — Ambulatory Visit: Payer: Medicare Other | Admitting: Family Medicine

## 2017-09-18 ENCOUNTER — Other Ambulatory Visit (HOSPITAL_COMMUNITY): Payer: Self-pay | Admitting: *Deleted

## 2017-09-18 DIAGNOSIS — C61 Malignant neoplasm of prostate: Secondary | ICD-10-CM

## 2017-09-19 ENCOUNTER — Encounter (HOSPITAL_COMMUNITY): Payer: Medicare Other

## 2017-09-19 ENCOUNTER — Encounter (HOSPITAL_COMMUNITY): Payer: Self-pay

## 2017-09-19 ENCOUNTER — Encounter (HOSPITAL_COMMUNITY): Payer: Medicare Other | Attending: Oncology

## 2017-09-19 VITALS — BP 141/84 | HR 74 | Temp 98.1°F | Resp 18

## 2017-09-19 DIAGNOSIS — C61 Malignant neoplasm of prostate: Secondary | ICD-10-CM | POA: Diagnosis not present

## 2017-09-19 DIAGNOSIS — Z809 Family history of malignant neoplasm, unspecified: Secondary | ICD-10-CM | POA: Insufficient documentation

## 2017-09-19 DIAGNOSIS — Z833 Family history of diabetes mellitus: Secondary | ICD-10-CM | POA: Diagnosis not present

## 2017-09-19 DIAGNOSIS — Z23 Encounter for immunization: Secondary | ICD-10-CM

## 2017-09-19 DIAGNOSIS — Z9221 Personal history of antineoplastic chemotherapy: Secondary | ICD-10-CM | POA: Diagnosis not present

## 2017-09-19 DIAGNOSIS — Z8249 Family history of ischemic heart disease and other diseases of the circulatory system: Secondary | ICD-10-CM | POA: Insufficient documentation

## 2017-09-19 DIAGNOSIS — Z5111 Encounter for antineoplastic chemotherapy: Secondary | ICD-10-CM | POA: Diagnosis present

## 2017-09-19 DIAGNOSIS — Z87891 Personal history of nicotine dependence: Secondary | ICD-10-CM | POA: Diagnosis not present

## 2017-09-19 DIAGNOSIS — Z9889 Other specified postprocedural states: Secondary | ICD-10-CM | POA: Diagnosis not present

## 2017-09-19 DIAGNOSIS — R7303 Prediabetes: Secondary | ICD-10-CM

## 2017-09-19 DIAGNOSIS — M8580 Other specified disorders of bone density and structure, unspecified site: Secondary | ICD-10-CM

## 2017-09-19 LAB — COMPREHENSIVE METABOLIC PANEL
ALK PHOS: 56 U/L (ref 38–126)
ALT: 16 U/L — AB (ref 17–63)
AST: 21 U/L (ref 15–41)
Albumin: 4.2 g/dL (ref 3.5–5.0)
Anion gap: 9 (ref 5–15)
BILIRUBIN TOTAL: 0.4 mg/dL (ref 0.3–1.2)
BUN: 23 mg/dL — AB (ref 6–20)
CALCIUM: 9.2 mg/dL (ref 8.9–10.3)
CHLORIDE: 108 mmol/L (ref 101–111)
CO2: 24 mmol/L (ref 22–32)
CREATININE: 0.75 mg/dL (ref 0.61–1.24)
Glucose, Bld: 102 mg/dL — ABNORMAL HIGH (ref 65–99)
Potassium: 3.8 mmol/L (ref 3.5–5.1)
Sodium: 141 mmol/L (ref 135–145)
TOTAL PROTEIN: 7.3 g/dL (ref 6.5–8.1)

## 2017-09-19 LAB — CBC WITH DIFFERENTIAL/PLATELET
BASOS ABS: 0 10*3/uL (ref 0.0–0.1)
Basophils Relative: 0 %
EOS PCT: 4 %
Eosinophils Absolute: 0.4 10*3/uL (ref 0.0–0.7)
HEMATOCRIT: 37.8 % — AB (ref 39.0–52.0)
HEMOGLOBIN: 12.8 g/dL — AB (ref 13.0–17.0)
LYMPHS ABS: 2.7 10*3/uL (ref 0.7–4.0)
LYMPHS PCT: 29 %
MCH: 30.9 pg (ref 26.0–34.0)
MCHC: 33.9 g/dL (ref 30.0–36.0)
MCV: 91.3 fL (ref 78.0–100.0)
Monocytes Absolute: 0.6 10*3/uL (ref 0.1–1.0)
Monocytes Relative: 6 %
NEUTROS ABS: 5.5 10*3/uL (ref 1.7–7.7)
NEUTROS PCT: 61 %
PLATELETS: 366 10*3/uL (ref 150–400)
RBC: 4.14 MIL/uL — AB (ref 4.22–5.81)
RDW: 12.5 % (ref 11.5–15.5)
WBC: 9.2 10*3/uL (ref 4.0–10.5)

## 2017-09-19 MED ORDER — VITAMIN D-3 25 MCG (1000 UT) PO CAPS: 2000.0000 [IU] | ORAL_CAPSULE | Freq: Every day | ORAL | 0 refills | Status: DC

## 2017-09-19 MED ORDER — INFLUENZA VAC SPLIT QUAD 0.5 ML IM SUSY
0.5000 mL | PREFILLED_SYRINGE | Freq: Once | INTRAMUSCULAR | Status: AC
  Administered 2017-09-19: 0.5 mL via INTRAMUSCULAR

## 2017-09-19 MED ORDER — INFLUENZA VAC SPLIT QUAD 0.5 ML IM SUSY
PREFILLED_SYRINGE | INTRAMUSCULAR | Status: AC
  Filled 2017-09-19: qty 0.5

## 2017-09-19 MED ORDER — LEUPROLIDE ACETATE (3 MONTH) 22.5 MG IM KIT
22.5000 mg | PACK | Freq: Once | INTRAMUSCULAR | Status: AC
  Administered 2017-09-19: 22.5 mg via INTRAMUSCULAR
  Filled 2017-09-19: qty 22.5

## 2017-09-19 NOTE — Progress Notes (Signed)
Patient tolerated flu shot and lupron shot with no complaints voiced.  Both sites clean and dry with no bruising or swelling noted at sites.  Band aids applied.  VSS with discharge and left ambulatory with no s/s of distress noted.

## 2017-09-19 NOTE — Patient Instructions (Signed)
[facility] at [facility]  Discharge Instructions:  You received a flu shot and lupron shot today.  Keep all scheduled appointments and call for any questions or concerns.  _______________________________________________________________  Thank you for choosing [place] at [facility] to provide your oncology and hematology care.  To afford each patient quality time with our providers, please arrive at least 15 minutes before your scheduled appointment.  You need to re-schedule your appointment if you arrive 10 or more minutes late.  We strive to give you quality time with our providers, and arriving late affects you and other patients whose appointments are after yours.  Also, if you no show three or more times for appointments you may be dismissed from the clinic.  Again, thank you for choosing [facility] at [facility] hope is that these requests will allow you access to exceptional care and in a timely manner. _______________________________________________________________  If you have questions after your visit, please contact our office at [phone] between the hours of 8:30 a.m. and 5:00 p.m. Voicemails left after 4:30 p.m. will not be returned until the following business day. _______________________________________________________________  For prescription refill requests, have your pharmacy contact our office. _______________________________________________________________  Recommendations made by the consultant and any test results will be sent to your referring physician. _______________________________________________________________

## 2017-09-20 LAB — PSA: Prostatic Specific Antigen: 5.15 ng/mL — ABNORMAL HIGH (ref 0.00–4.00)

## 2017-10-13 ENCOUNTER — Other Ambulatory Visit (HOSPITAL_COMMUNITY): Payer: Self-pay

## 2017-10-13 ENCOUNTER — Ambulatory Visit (HOSPITAL_COMMUNITY): Payer: Self-pay

## 2017-10-24 ENCOUNTER — Ambulatory Visit (HOSPITAL_COMMUNITY): Payer: Self-pay | Admitting: Adult Health

## 2017-10-24 ENCOUNTER — Other Ambulatory Visit (HOSPITAL_COMMUNITY): Payer: Self-pay

## 2017-11-08 ENCOUNTER — Encounter (HOSPITAL_COMMUNITY): Payer: Medicare Other | Attending: Oncology

## 2017-11-08 ENCOUNTER — Encounter (HOSPITAL_COMMUNITY): Payer: Self-pay | Admitting: Adult Health

## 2017-11-08 ENCOUNTER — Other Ambulatory Visit: Payer: Self-pay

## 2017-11-08 ENCOUNTER — Encounter (HOSPITAL_BASED_OUTPATIENT_CLINIC_OR_DEPARTMENT_OTHER): Payer: Medicare Other | Admitting: Adult Health

## 2017-11-08 VITALS — BP 131/86 | HR 85 | Temp 97.9°F | Resp 20 | Wt 203.1 lb

## 2017-11-08 DIAGNOSIS — R339 Retention of urine, unspecified: Secondary | ICD-10-CM | POA: Insufficient documentation

## 2017-11-08 DIAGNOSIS — C61 Malignant neoplasm of prostate: Secondary | ICD-10-CM

## 2017-11-08 DIAGNOSIS — Z8249 Family history of ischemic heart disease and other diseases of the circulatory system: Secondary | ICD-10-CM | POA: Diagnosis not present

## 2017-11-08 DIAGNOSIS — R531 Weakness: Secondary | ICD-10-CM | POA: Insufficient documentation

## 2017-11-08 DIAGNOSIS — R350 Frequency of micturition: Secondary | ICD-10-CM

## 2017-11-08 DIAGNOSIS — Z809 Family history of malignant neoplasm, unspecified: Secondary | ICD-10-CM | POA: Diagnosis not present

## 2017-11-08 DIAGNOSIS — Z9221 Personal history of antineoplastic chemotherapy: Secondary | ICD-10-CM | POA: Diagnosis not present

## 2017-11-08 DIAGNOSIS — Z79899 Other long term (current) drug therapy: Secondary | ICD-10-CM | POA: Diagnosis not present

## 2017-11-08 DIAGNOSIS — R972 Elevated prostate specific antigen [PSA]: Secondary | ICD-10-CM | POA: Diagnosis not present

## 2017-11-08 DIAGNOSIS — I10 Essential (primary) hypertension: Secondary | ICD-10-CM | POA: Diagnosis not present

## 2017-11-08 DIAGNOSIS — Z833 Family history of diabetes mellitus: Secondary | ICD-10-CM | POA: Insufficient documentation

## 2017-11-08 DIAGNOSIS — G8929 Other chronic pain: Secondary | ICD-10-CM | POA: Diagnosis not present

## 2017-11-08 DIAGNOSIS — Z79891 Long term (current) use of opiate analgesic: Secondary | ICD-10-CM | POA: Insufficient documentation

## 2017-11-08 DIAGNOSIS — M8580 Other specified disorders of bone density and structure, unspecified site: Secondary | ICD-10-CM | POA: Insufficient documentation

## 2017-11-08 DIAGNOSIS — Z791 Long term (current) use of non-steroidal anti-inflammatories (NSAID): Secondary | ICD-10-CM | POA: Insufficient documentation

## 2017-11-08 DIAGNOSIS — F1020 Alcohol dependence, uncomplicated: Secondary | ICD-10-CM | POA: Diagnosis not present

## 2017-11-08 DIAGNOSIS — Z87891 Personal history of nicotine dependence: Secondary | ICD-10-CM | POA: Diagnosis not present

## 2017-11-08 DIAGNOSIS — E7800 Pure hypercholesterolemia, unspecified: Secondary | ICD-10-CM | POA: Insufficient documentation

## 2017-11-08 DIAGNOSIS — K219 Gastro-esophageal reflux disease without esophagitis: Secondary | ICD-10-CM | POA: Insufficient documentation

## 2017-11-08 DIAGNOSIS — Z9889 Other specified postprocedural states: Secondary | ICD-10-CM | POA: Diagnosis not present

## 2017-11-08 DIAGNOSIS — Z8546 Personal history of malignant neoplasm of prostate: Secondary | ICD-10-CM | POA: Insufficient documentation

## 2017-11-08 DIAGNOSIS — Z888 Allergy status to other drugs, medicaments and biological substances status: Secondary | ICD-10-CM | POA: Diagnosis not present

## 2017-11-08 DIAGNOSIS — Z923 Personal history of irradiation: Secondary | ICD-10-CM | POA: Insufficient documentation

## 2017-11-08 LAB — URINALYSIS, ROUTINE W REFLEX MICROSCOPIC
Bilirubin Urine: NEGATIVE
GLUCOSE, UA: NEGATIVE mg/dL
Hgb urine dipstick: NEGATIVE
KETONES UR: NEGATIVE mg/dL
LEUKOCYTES UA: NEGATIVE
NITRITE: NEGATIVE
PH: 5 (ref 5.0–8.0)
Protein, ur: NEGATIVE mg/dL
SPECIFIC GRAVITY, URINE: 1.023 (ref 1.005–1.030)

## 2017-11-08 LAB — CBC WITH DIFFERENTIAL/PLATELET
BASOS ABS: 0 10*3/uL (ref 0.0–0.1)
Basophils Relative: 0 %
EOS PCT: 5 %
Eosinophils Absolute: 0.4 10*3/uL (ref 0.0–0.7)
HCT: 40.4 % (ref 39.0–52.0)
HEMOGLOBIN: 13.2 g/dL (ref 13.0–17.0)
LYMPHS ABS: 1.7 10*3/uL (ref 0.7–4.0)
LYMPHS PCT: 23 %
MCH: 30.2 pg (ref 26.0–34.0)
MCHC: 32.7 g/dL (ref 30.0–36.0)
MCV: 92.4 fL (ref 78.0–100.0)
Monocytes Absolute: 0.5 10*3/uL (ref 0.1–1.0)
Monocytes Relative: 7 %
NEUTROS ABS: 4.9 10*3/uL (ref 1.7–7.7)
NEUTROS PCT: 65 %
PLATELETS: 371 10*3/uL (ref 150–400)
RBC: 4.37 MIL/uL (ref 4.22–5.81)
RDW: 12.6 % (ref 11.5–15.5)
WBC: 7.5 10*3/uL (ref 4.0–10.5)

## 2017-11-08 LAB — COMPREHENSIVE METABOLIC PANEL
ALT: 18 U/L (ref 17–63)
ANION GAP: 9 (ref 5–15)
AST: 22 U/L (ref 15–41)
Albumin: 4.1 g/dL (ref 3.5–5.0)
Alkaline Phosphatase: 77 U/L (ref 38–126)
BUN: 24 mg/dL — AB (ref 6–20)
CHLORIDE: 106 mmol/L (ref 101–111)
CO2: 23 mmol/L (ref 22–32)
Calcium: 9.1 mg/dL (ref 8.9–10.3)
Creatinine, Ser: 0.82 mg/dL (ref 0.61–1.24)
GFR calc Af Amer: >60 mL/min (ref >60)
GFR calc non Af Amer: >60 mL/min (ref >60)
GLUCOSE: 149 mg/dL — AB (ref 65–99)
POTASSIUM: 3.7 mmol/L (ref 3.5–5.1)
Sodium: 138 mmol/L (ref 135–145)
TOTAL PROTEIN: 7.4 g/dL (ref 6.5–8.1)
Total Bilirubin: 0.5 mg/dL (ref 0.3–1.2)

## 2017-11-08 LAB — PSA: Prostatic Specific Antigen: 10.64 ng/mL — ABNORMAL HIGH (ref 0.00–4.00)

## 2017-11-08 NOTE — Progress Notes (Signed)
[facility] [place], [place] [address]   CLINIC:  Medical Oncology/Hematology  PCP:  Dettinger, Fransisca Kaufmann, MD [place] [phone]   REASON FOR VISIT:  Follow-up for Stage IIC biochemically recurrent prostate cancer AND osteopenia   CURRENT THERAPY: Casodex 50 mg po daily/[name] inj every 3 months AND Prolia inj every 6 months    BRIEF ONCOLOGIC HISTORY:    Prostate cancer ([place])   06/20/2011 Initial Diagnosis    Prostate cancer ([place])      01/12/2016 Imaging    Bone density- This patient is considered osteopenic by World Healh Organization (WHO) Criteria.       03/16/2016 Imaging    CT CAP- No evidence of metastatic disease within the chest, abdomen, or pelvis. No other acute findings identified.      06/17/2016 Imaging    Bone scan- 1. Negative for metastatic pattern.      07/11/2016 Miscellaneous    Prolia every 6 months for osteopenia.      09/12/2016 -  Chemotherapy    Casodex/Depo-[name] every 3 months       09/27/2016 Imaging    CT CAP -No acute findings and no evidence for metastatic disease within the chest, abdomen or pelvis.        HISTORY OF PRESENT ILLNESS:  (From Dr. Laverle Patter note on 07/12/17)     INTERVAL HISTORY:  Mr. Mogle 66 y.o. male returns for routine follow-up for prostate cancer.   Overall, he tells me he has not been feeling that well.  Energy levels 25%; he feels very fatigued and weak.  He attributes some of this to his chronic pain, but also to the [name] injections.  He has intermittent hot flashes and sweats, which have been consistent issues with him on androgen deprivation therapy.  Remains on Casodex. However, he tells me that he sometimes forgets to take the medication and also tries to "stretch out" the prescription by taking the Casodex only every few days because he is concerned about cost.  He shares with me that with both his Medicare and Medicaid, the medication costs  about $3.50 per month.  However, he is on a fixed income on disability and his wife is unable to work; therefore they have very limited financial means.  He tells me that he had to borrow money from his daughter just to be able to put gas in his car to come to his appointment today.  These issues have caused him to miss follow-up appointments in the past.  His biggest complaint today is urinary frequency.  States that he "constantly feel like my bladder is full.  And when I tried to use the bathroom, only drips and dribble come out.  I wake up 6-8 times at night to go to the bathroom."  This severely affects his ability to sleep well at night.  Denies any dysuria or hematuria.  Denies fever and chills.  He has not seen a urologist that he is aware of.  He has chronic low back and leg pain (which is managed by an outside provider).  He feels like his pain is getting worse, particularly in recent months.  Pain 6/10 on pain scale today.  Denies any change in bowels, including bowel incontinence.  Denies any GI complaints.  No dizziness, headaches, or falls.    REVIEW OF SYSTEMS:  Review of Systems  Constitutional: Positive for diaphoresis and fatigue. Negative for chills and fever.  HENT:  Positive for trouble swallowing (Managed by outside provider).   Eyes: Negative.   Respiratory: Negative.  Negative for cough and shortness of breath.   Cardiovascular: Negative.   Gastrointestinal: Negative.  Negative for abdominal pain, blood in stool, constipation, diarrhea, nausea and vomiting.  Endocrine: Positive for hot flashes.  Genitourinary: Positive for bladder incontinence, difficulty urinating, frequency and nocturia. Negative for dysuria, hematuria and pelvic pain.   Musculoskeletal: Positive for arthralgias and back pain.  Skin: Negative.  Negative for rash.  Neurological: Negative for dizziness and headaches.  Hematological: Bruises/bleeds easily.  Psychiatric/Behavioral: Positive for sleep  disturbance.     PAST MEDICAL/SURGICAL HISTORY:  Past Medical History:  Diagnosis Date  . Acid reflux   . Alcoholism ([name])   . Arthritis   . Fatty liver   . Gynecomastia, male 01/11/2013   Secondary to prostate ca Tx.   . H/O Bell's palsy   . Hemorrhoids    history  . High cholesterol   . History of back injury 11/19/2013  . Hyperglycemia   . Hyperlipidemia   . Hypertension   . Prostate cancer ([place])   . Prostate cancer ([place]) 06/20/2011  . Prostate carcinoma, recurrent ([place]) 08/10/2012   Past Surgical History:  Procedure Laterality Date  . [place]   left  . COLONOSCOPY  2008  . ESOPHAGOGASTRODUODENOSCOPY    . HERNIA REPAIR  2010  . I&D EXTREMITY Left 06/10/2015   Procedure: IRRIGATION AND DEBRIDEMENT EXTREMITY LEFT HAND EXPLORATION, nerve repair;  Surgeon: Charlotte Crumb, MD;  Location: [facility];  Service: Orthopedics;  Laterality: Left;  . KNEE SURGERY     left  . PROSTATE BIOPSY  11/06     SOCIAL HISTORY:  Social History   Socioeconomic History  . Marital status: Married    Spouse name: Not on file  . Number of children: Not on file  . Years of education: Not on file  . Highest education level: Not on file  Social Needs  . Financial resource strain: Not on file  . Food insecurity - worry: Not on file  . Food insecurity - inability: Not on file  . Transportation needs - medical: Not on file  . Transportation needs - non-medical: Not on file  Occupational History  . Not on file  Tobacco Use  . Smoking status: Former Smoker    Packs/day: 2.50    Years: 3.00    Pack years: 7.50  . Smokeless tobacco: Never Used  Substance and Sexual Activity  . Alcohol use: No    Comment: former drinker 20 years ago  . Drug use: No  . Sexual activity: Not on file  Other Topics Concern  . Not on file  Social History Narrative  . Not on file    FAMILY HISTORY:  Family History  Problem Relation Age of Onset  . Heart failure Mother   . Diabetes Father   .  Cancer Sister   . Diabetes Brother     CURRENT MEDICATIONS:  Outpatient Encounter Medications as of 11/08/2017  Medication Sig Note  . amLODipine (NORVASC) 10 MG tablet Take 1 tablet (10 mg total) by mouth daily.   . bicalutamide (CASODEX) 50 MG tablet Take 1 tablet (50 mg total) by mouth daily.   . cephALEXin (KEFLEX) 500 MG capsule Take 1 capsule (500 mg total) by mouth 4 (four) times daily. For 7 days   . Cholecalciferol (VITAMIN D-3) 1000 units CAPS Take 2 capsules (2,000 Units total) by mouth daily.   Marland Kitchen  denosumab (PROLIA) 60 MG/ML SOLN injection Inject 60 mg into the skin every 6 (six) months. Administer in upper arm, thigh, or abdomen   . diclofenac (VOLTAREN) 75 MG EC tablet Take 1 tablet (75 mg total) by mouth 2 (two) times daily.   . diphenhydrAMINE (BENADRYL) 25 MG tablet Take 25 mg by mouth every 6 (six) hours as needed.   Marland Kitchen Leuprolide Acetate ([name] IJ) Inject as directed every 3 (three) months. 10/30/2015: DUE FOR INJECTION 11/2015   . meloxicam (MOBIC) 15 MG tablet Take 1 tablet (15 mg total) by mouth daily.   . traMADol (ULTRAM) 50 MG tablet Take 1 tablet (50 mg total) by mouth every 12 (twelve) hours as needed.    No facility-administered encounter medications on file as of 11/08/2017.     ALLERGIES:  Allergies  Allergen Reactions  . Tylenol [Acetaminophen]     Told not to take this because of problems with the cancer medications.     PHYSICAL EXAM:  ECOG Performance status: 1-2 - Symptomatic; may require occasional assistance  Vitals:   11/08/17 1119  BP: 131/86  Pulse: 85  Resp: 20  Temp: 97.9 F (36.6 C)  SpO2: 98%   Filed Weights   11/08/17 1119  Weight: 203 lb 1.6 oz (92.1 kg)    Physical Exam  Constitutional: He is oriented to person, place, and time.  He appears fatigued; no acute distress  HENT:  Head: Normocephalic.  Mouth/Throat: Oropharynx is clear and moist. No oropharyngeal exudate.  Eyes: Pupils are equal, round, and reactive to  light. No scleral icterus.  Conjunctivae mildly erythematous bilat  Neck: Normal range of motion. Neck supple.  Cardiovascular: Normal rate and regular rhythm.  Pulmonary/Chest: Effort normal and breath sounds normal. No respiratory distress. He has no wheezes.  Abdominal: Soft. Bowel sounds are normal. He exhibits distension (Mild distension appreciated). There is no tenderness.  Musculoskeletal: Normal range of motion. He exhibits no edema.  Lymphadenopathy:    He has no cervical adenopathy.       Right: No supraclavicular adenopathy present.       Left: No supraclavicular adenopathy present.  Neurological: He is alert and oriented to person, place, and time. No cranial nerve deficit.  Skin: Skin is warm and dry. No rash noted.  Psychiatric: Mood and affect normal.  Nursing note and vitals reviewed.    LABORATORY DATA:  I have reviewed the labs as listed.  CBC    Component Value Date/Time   WBC 7.5 11/08/2017 1048   RBC 4.37 11/08/2017 1048   HGB 13.2 11/08/2017 1048   HGB 12.4 (L) 05/19/2016 1623   HCT 40.4 11/08/2017 1048   HCT 36.1 (L) 05/19/2016 1623   PLT 371 11/08/2017 1048   PLT 369 05/19/2016 1623   MCV 92.4 11/08/2017 1048   MCV 90 05/19/2016 1623   MCH 30.2 11/08/2017 1048   MCHC 32.7 11/08/2017 1048   RDW 12.6 11/08/2017 1048   RDW 13.3 05/19/2016 1623   LYMPHSABS 1.7 11/08/2017 1048   LYMPHSABS 2.3 05/19/2016 1623   MONOABS 0.5 11/08/2017 1048   EOSABS 0.4 11/08/2017 1048   EOSABS 0.3 05/19/2016 1623   BASOSABS 0.0 11/08/2017 1048   BASOSABS 0.0 05/19/2016 1623   CMP Latest Ref Rng & Units 11/08/2017 09/19/2017 07/12/2017  Glucose 65 - 99 mg/dL 149(H) 102(H) 115(H)  BUN 6 - 20 mg/dL 24(H) 23(H) 17  Creatinine 0.61 - 1.24 mg/dL 0.82 0.75 0.79  Sodium 135 - 145 mmol/L 138 141 138  Potassium 3.5 - 5.1 mmol/L 3.7 3.8 3.8  Chloride 101 - 111 mmol/L 106 108 103  CO2 22 - 32 mmol/L 23 24 25   Calcium 8.9 - 10.3 mg/dL 9.1 9.2 9.1  Total Protein 6.5 - 8.1 g/dL  7.4 7.3 7.3  Total Bilirubin 0.3 - 1.2 mg/dL 0.5 0.4 0.6  Alkaline Phos 38 - 126 U/L 77 56 49  AST 15 - 41 U/L 22 21 19   ALT 17 - 63 U/L 18 16(L) 19       PENDING LABS:    DIAGNOSTIC IMAGING:  *The following radiologic images and reports have been reviewed independently and agree with below findings.  CT chest/abd/pelvis: 09/27/16 CLINICAL DATA:  Followup prostate cancer  EXAM: CT CHEST, ABDOMEN, AND PELVIS WITH CONTRAST  TECHNIQUE: Multidetector CT imaging of the chest, abdomen and pelvis was performed following the standard protocol during bolus administration of intravenous contrast.  CONTRAST:  126mL ISOVUE-300 IOPAMIDOL (ISOVUE-300) INJECTION 61%  COMPARISON:  03/16/2016  FINDINGS: CT CHEST FINDINGS  Cardiovascular: No significant vascular findings. Normal heart size. No pericardial effusion.  Mediastinum/Nodes: No enlarged mediastinal, hilar, or axillary lymph nodes. Thyroid gland, trachea, and esophagus demonstrate no significant findings.  Lungs/Pleura: Lungs are clear. No pleural effusion or pneumothorax.  Musculoskeletal: No chest wall mass or suspicious bone lesions identified.  CT ABDOMEN PELVIS FINDINGS  Hepatobiliary: No focal liver abnormality is seen. No gallstones, gallbladder wall thickening, or biliary dilatation.  Pancreas: Unremarkable. No pancreatic ductal dilatation or surrounding inflammatory changes.  Spleen: Normal in size without focal abnormality.  Adrenals/Urinary Tract: Normal adrenal glands. Small cyst arising from the posterior left kidney measures 1.5 cm and is unchanged from previous exam. No mass or hydronephrosis. The urinary bladder appears normal. Patent ureters.  Stomach/Bowel: Stomach is within normal limits. Appendix appears normal. No evidence of bowel wall thickening, distention, or inflammatory changes.  Vascular/Lymphatic: Calcified atherosclerotic disease involves the abdominal aorta. No  aneurysm. No enlarged retroperitoneal or mesenteric adenopathy. No enlarged pelvic or inguinal lymph nodes.  Reproductive: Fiducial markers noted within the prostate bed. No mass identified.  Other: There is no ascites or focal fluid collections within the abdomen or pelvis.  Musculoskeletal: No aggressive lytic or sclerotic bone lesions. Degenerative disc disease noted within the lumbar spine.  IMPRESSION: 1. No acute findings and no evidence for metastatic disease within the chest, abdomen or pelvis.   Electronically Signed   By: Kerby Moors M.D.   On: 09/27/2016 16:22   Bone scan: 06/17/16 CLINICAL DATA:  Prostate cancer with elevated PSA.  EXAM: NUCLEAR MEDICINE WHOLE BODY BONE SCAN  TECHNIQUE: Whole body anterior and posterior images were obtained approximately 3 hours after intravenous injection of radiopharmaceutical.  RADIOPHARMACEUTICALS:  23.8 mCi Technetium-35m MDP IV  COMPARISON:  02/11/2015  FINDINGS: Normal distribution of tracer with visible kidneys and bladder. Negative for metastatic pattern. Increased activity around chronic bilateral L5 pars defects. Subtle increased activity left ischial tuberosity has no correlate on recent abdomen pelvis CT. Mildly heterogeneous calvarial activity is stable since 2011. Degenerative activity to the shoulders, hands, knees, and feet.  IMPRESSION: 1. Negative for metastatic pattern. 2. Increased degenerative activity at chronic L5 pars defects.   Electronically Signed   By: Monte Fantasia M.D.   On: 06/17/2016 12:50   DEXA scan: 01/12/16 EXAM: DUAL X-RAY ABSORPTIOMETRY (DXA) FOR BONE MINERAL DENSITY  IMPRESSION: Ordering Physician:  Dr. Patrici Ranks,  Your patient Jeriko Kowalke completed a BMD test on 01/12/2016 using the [employer] Point (software  version: 14.10) manufactured by [employer]. The following summarizes the results of our evaluation. PATIENT  BIOGRAPHICAL: Name: DAMANI, KELEMEN Patient ID: [id number] Birth Date: 1951/11/22 Height: 66.0 in. Gender: Male Exam Date: 01/12/2016 Weight: 210.0 lbs. Indications: Follow up Osteopenia, Height Loss, History of Fracture (Adult), Low Calcium Intake, Prostate Cancer, Recurrent Falls Fractures: Wrist Treatments: Multivitamin DENSITOMETRY RESULTS: Site      Region    Measured Date Measured Age WHO Classification Young Adult T-score BMD         %Change vs. Previous Significant Change (*) AP Spine L1-L4 01/12/2016 62.6 N/A -0.4 1.171 g/cm2 5.1% - AP Spine L1-L4 02/24/2014 60.7 N/A -0.9 1.114 g/cm2 - -  DualFemur Neck Left 01/12/2016 62.6 N/A -1.8 0.838 g/cm2 -3.1% - DualFemur Neck Left 02/24/2014 60.7 N/A -1.6 0.865 g/cm2 - - ASSESSMENT: This patient is considered osteopenic by World Healh Organization (WHO) Criteria. Compared with the prior study on 02/24/2014, the BMD of the (lumbar spine/total hip/femoral neck) show (no statistically significant change ).    PATHOLOGY:     ASSESSMENT & PLAN:   Stage IIC biochemically recurrent prostate cancer:  -Found to have biochemical recurrence in 2012 and was started on [name] at that time. Casodex was added in 2015. His PSA began to rise and Casodex was dicontinued in 05/2016 to try to drive PSA back down. However, PSA continued to climb at that time. Therefore, Casodex was resumed in 09/2016 to try to re-challenge response.  Testosterone levels have been in castrate range.  He has continued on [name] injections every 3 months.   -PSA trends were declining and have begun to rise again. PSA 1.77 in 03/2017; PSA 3 in 07/2017; PSA 5.15 in 09/2017. PSA doubling time ~4-6 months.  Discussed with Dr. Talbert Cage.  Given his continued increase in PSA, will obtain restaging imaging with CT chest/abd/pelvis and bone scan as soon as able.   -Testosterone and PSA levels pending for today.  If he his testosterone remains at castrate level and he does not have  evidence of metastatic disease, then we would consider stopping Casodex and starting Erleada + [name].  If he does have metastatic disease, then he could start [place] or [place]. We briefly discussed both of these treatment options today. Will discuss in more detail after we have scan results.  -Continue Casodex 50 mg daily and [name] for now.  Oncology Flowsheet 09/19/2017  leuprolide ([name]) IM 22.5 mg  -Return to cancer center a few days after restaging imaging to review results and subsequent treatment planning.   Urinary frequency/Incomplete bladder emptying: -Likely due to previous radiation therapy. -Will collect urinalysis and urine culture today to evaluate for UTI.  We will contact the patient via phone with these results. -Urgent referral placed to urology for additional evaluation and management.  Financial concerns: -I will ask Durene Cal, our patient advocate, to see patient today to discuss possible resources available.  -Provided emotional support today with active listening, validation of concerns, and connection with possible resources.   Bone health:  -Last DEXA scan in 12/2015 showed osteopenia with T-score -1.8.  -Biennial DEXA scan due in 12/2017; orders placed today.  -Continue Prolia inj every 6 months.  Oncology Flowsheet 07/12/2017  denosumab (PROLIA) [place] 60 mg       Dispo:  -Urgent referral to urology for management of urinary frequency and incomplete bladder emptying -CT chest/abd/pelvis and bone scan as soon as able; orders placed today.   -Return to cancer center a few days after  restaging imaging to review results and subsequent treatment planning.  -Biennial DEXA scan due in 12/2017; orders placed today.    All questions were answered to patient's stated satisfaction. Encouraged patient to call with any new concerns or questions before his next visit to the cancer center and we can certain see him sooner, if needed.    Plan of care discussed with Dr. Talbert Cage,  who agrees with the above aforementioned.    A total of 35 minutes was spent in face-to-face care of this patient, with greater than 50% of that time spent in counseling and care-coordination.    Orders placed this encounter:  Orders Placed This Encounter  Procedures  . Urine culture  . CT Chest W Contrast  . CT Abdomen Pelvis W Contrast  . NM Bone Scan Whole Body  . DG Bone Density  . Urinalysis, Routine w reflex microscopic      Mike Craze, NP [facility] [phone]

## 2017-11-08 NOTE — Patient Instructions (Addendum)
[place] at [facility] Discharge Instructions  RECOMMENDATIONS MADE BY THE CONSULTANT AND ANY TEST RESULTS WILL BE SENT TO YOUR REFERRING PHYSICIAN.  You were seen today by Mike Craze, NP We have put in a referral to Urology, they will contact you for the appointment We will schedule you for CT scan and bone scan Follow up a few days after scans See schedulers up front for appointments   Thank you for choosing [place] at [facility] to provide your oncology and hematology care.  To afford each patient quality time with our provider, please arrive at least 15 minutes before your scheduled appointment time.    If you have a lab appointment with the [facility] please come in thru the  Main Entrance and check in at the main information desk  You need to re-schedule your appointment should you arrive 10 or more minutes late.  We strive to give you quality time with our providers, and arriving late affects you and other patients whose appointments are after yours.  Also, if you no show three or more times for appointments you may be dismissed from the clinic at the providers discretion.     Again, thank you for choosing [facility].  Our hope is that these requests will decrease the amount of time that you wait before being seen by our physicians.       _____________________________________________________________  Should you have questions after your visit to [facility], please contact our office at [phone] between the hours of 8:30 a.m. and 4:30 p.m.  Voicemails left after 4:30 p.m. will not be returned until the following business day.  For prescription refill requests, have your pharmacy contact our office.       Resources For Cancer Patients and their Caregivers ? American Cancer Society: Can assist with transportation, wigs, general needs, runs Look Good Feel Better.         (706)790-3561 ? Cancer Care: Provides financial assistance, online support groups, medication/co-pay assistance.  1-800-813-HOPE 629-830-8988) ? [facility] Assists [place] cancer patients and their families through emotional , educational and financial support.  717 312 5517 ? [place] DSS Where to apply for food stamps, Medicaid and utility assistance. 225-320-3409 ? [facility]: Transportation to medical appointments. 856-595-9862 ? Social Security Administration: May apply for disability if have a Stage IV cancer. (234)624-2074 (830)420-8974 ? [employer], Disability and Transit Services: Assists with nutrition, care and transit needs. [place] Support Programs: @10RELATIVEDAYS @ > Cancer Support Group  2nd Tuesday of the month 1pm-2pm, Journey Room  > Creative Journey  3rd Tuesday of the month 1130am-1pm, Journey Room  > Look Good Feel Better  1st Wednesday of the month 10am-12 noon, Journey Room (Call [facility] to register [phone])

## 2017-11-08 NOTE — Progress Notes (Signed)
Referred patient to [facility].  Appt w/Dr. Diona Fanti 01/02/2018 @ 3:00 ([place] office). Patient will be placed on a call list for an earlier appt if one becomes available.  New patient packet will be mailed to patient from [facility].    Records faxed to [phone].

## 2017-11-09 LAB — TESTOSTERONE

## 2017-11-10 LAB — URINE CULTURE: Culture: NO GROWTH

## 2017-11-13 ENCOUNTER — Encounter (HOSPITAL_COMMUNITY)
Admission: RE | Admit: 2017-11-13 | Discharge: 2017-11-13 | Disposition: A | Discharge status: Home / Self Care | Payer: Medicare Other | Source: Ambulatory Visit | Attending: Adult Health | Admitting: Adult Health

## 2017-11-13 ENCOUNTER — Encounter (HOSPITAL_COMMUNITY): Payer: Self-pay

## 2017-11-13 DIAGNOSIS — C61 Malignant neoplasm of prostate: Secondary | ICD-10-CM | POA: Insufficient documentation

## 2017-11-13 DIAGNOSIS — R972 Elevated prostate specific antigen [PSA]: Secondary | ICD-10-CM | POA: Insufficient documentation

## 2017-11-13 MED ORDER — TECHNETIUM TC 99M MEDRONATE IV KIT
20.0000 | PACK | Freq: Once | INTRAVENOUS | Status: AC | PRN
  Administered 2017-11-13: 21 via INTRAVENOUS

## 2017-11-16 ENCOUNTER — Ambulatory Visit (HOSPITAL_COMMUNITY)
Admission: RE | Admit: 2017-11-16 | Discharge: 2017-11-16 | Disposition: A | Discharge status: Home / Self Care | Payer: Medicare Other | Source: Ambulatory Visit | Attending: Adult Health | Admitting: Adult Health

## 2017-11-16 DIAGNOSIS — I700 Atherosclerosis of aorta: Secondary | ICD-10-CM | POA: Insufficient documentation

## 2017-11-16 DIAGNOSIS — C7951 Secondary malignant neoplasm of bone: Secondary | ICD-10-CM | POA: Diagnosis not present

## 2017-11-16 DIAGNOSIS — C61 Malignant neoplasm of prostate: Secondary | ICD-10-CM

## 2017-11-16 DIAGNOSIS — R972 Elevated prostate specific antigen [PSA]: Secondary | ICD-10-CM | POA: Diagnosis not present

## 2017-11-16 MED ORDER — IOPAMIDOL (ISOVUE-300) INJECTION 61%
100.0000 mL | Freq: Once | INTRAVENOUS | Status: AC | PRN
  Administered 2017-11-16: 100 mL via INTRAVENOUS

## 2017-11-17 ENCOUNTER — Encounter (HOSPITAL_COMMUNITY): Payer: Self-pay | Admitting: Oncology

## 2017-11-17 ENCOUNTER — Encounter (HOSPITAL_COMMUNITY): Payer: Medicare Other | Attending: Oncology | Admitting: Oncology

## 2017-11-17 VITALS — BP 135/87 | HR 97 | Resp 16

## 2017-11-17 DIAGNOSIS — I10 Essential (primary) hypertension: Secondary | ICD-10-CM | POA: Insufficient documentation

## 2017-11-17 DIAGNOSIS — R350 Frequency of micturition: Secondary | ICD-10-CM | POA: Diagnosis not present

## 2017-11-17 DIAGNOSIS — Z9889 Other specified postprocedural states: Secondary | ICD-10-CM | POA: Insufficient documentation

## 2017-11-17 DIAGNOSIS — C7951 Secondary malignant neoplasm of bone: Secondary | ICD-10-CM

## 2017-11-17 DIAGNOSIS — Z8249 Family history of ischemic heart disease and other diseases of the circulatory system: Secondary | ICD-10-CM | POA: Insufficient documentation

## 2017-11-17 DIAGNOSIS — G8929 Other chronic pain: Secondary | ICD-10-CM | POA: Insufficient documentation

## 2017-11-17 DIAGNOSIS — Z8546 Personal history of malignant neoplasm of prostate: Secondary | ICD-10-CM | POA: Insufficient documentation

## 2017-11-17 DIAGNOSIS — Z87891 Personal history of nicotine dependence: Secondary | ICD-10-CM | POA: Insufficient documentation

## 2017-11-17 DIAGNOSIS — Z923 Personal history of irradiation: Secondary | ICD-10-CM | POA: Insufficient documentation

## 2017-11-17 DIAGNOSIS — F1020 Alcohol dependence, uncomplicated: Secondary | ICD-10-CM | POA: Insufficient documentation

## 2017-11-17 DIAGNOSIS — C61 Malignant neoplasm of prostate: Secondary | ICD-10-CM | POA: Diagnosis not present

## 2017-11-17 DIAGNOSIS — Z79891 Long term (current) use of opiate analgesic: Secondary | ICD-10-CM | POA: Insufficient documentation

## 2017-11-17 DIAGNOSIS — Z888 Allergy status to other drugs, medicaments and biological substances status: Secondary | ICD-10-CM | POA: Insufficient documentation

## 2017-11-17 DIAGNOSIS — Z791 Long term (current) use of non-steroidal anti-inflammatories (NSAID): Secondary | ICD-10-CM | POA: Insufficient documentation

## 2017-11-17 DIAGNOSIS — Z833 Family history of diabetes mellitus: Secondary | ICD-10-CM | POA: Insufficient documentation

## 2017-11-17 DIAGNOSIS — Z809 Family history of malignant neoplasm, unspecified: Secondary | ICD-10-CM | POA: Insufficient documentation

## 2017-11-17 DIAGNOSIS — Z9221 Personal history of antineoplastic chemotherapy: Secondary | ICD-10-CM | POA: Insufficient documentation

## 2017-11-17 DIAGNOSIS — R531 Weakness: Secondary | ICD-10-CM | POA: Insufficient documentation

## 2017-11-17 DIAGNOSIS — R339 Retention of urine, unspecified: Secondary | ICD-10-CM | POA: Insufficient documentation

## 2017-11-17 DIAGNOSIS — K219 Gastro-esophageal reflux disease without esophagitis: Secondary | ICD-10-CM | POA: Insufficient documentation

## 2017-11-17 DIAGNOSIS — E7800 Pure hypercholesterolemia, unspecified: Secondary | ICD-10-CM | POA: Insufficient documentation

## 2017-11-17 DIAGNOSIS — Z79899 Other long term (current) drug therapy: Secondary | ICD-10-CM | POA: Insufficient documentation

## 2017-11-17 DIAGNOSIS — M8580 Other specified disorders of bone density and structure, unspecified site: Secondary | ICD-10-CM | POA: Insufficient documentation

## 2017-11-17 MED ORDER — PREDNISONE 5 MG PO TABS: 5.0000 mg | ORAL_TABLET | Freq: Two times a day (BID) | ORAL | 5 refills | Status: DC

## 2017-11-17 MED ORDER — TAMSULOSIN HCL 0.4 MG PO CAPS: 0.4000 mg | ORAL_CAPSULE | Freq: Every day | ORAL | 2 refills | Status: DC

## 2017-11-17 MED ORDER — ABIRATERONE ACETATE 250 MG PO TABS
1000.0000 mg | ORAL_TABLET | Freq: Every day | ORAL | 0 refills | Status: DC
Start: 2017-11-17 — End: 2017-12-01

## 2017-11-17 NOTE — Progress Notes (Signed)
[place], [place] [address]   CLINIC:  Medical Oncology/Hematology  PCP:  Dettinger, Fransisca Kaufmann, MD [place] [id number] [phone]   REASON FOR VISIT:  Follow-up for Stage IIC biochemically recurrent prostate cancer AND osteopenia   CURRENT THERAPY: Casodex 50 mg po daily/[name] inj every 3 months AND Prolia inj every 6 months    BRIEF ONCOLOGIC HISTORY:    Prostate cancer ([place])   06/20/2011 Initial Diagnosis    Prostate cancer ([place])      01/12/2016 Imaging    Bone density- This patient is considered osteopenic by World Healh Organization (WHO) Criteria.       03/16/2016 Imaging    CT CAP- No evidence of metastatic disease within the chest, abdomen, or pelvis. No other acute findings identified.      06/17/2016 Imaging    Bone scan- 1. Negative for metastatic pattern.      07/11/2016 Miscellaneous    Prolia every 6 months for osteopenia.      09/12/2016 -  Chemotherapy    Casodex/Depo-[name] every 3 months       09/27/2016 Imaging    CT CAP -No acute findings and no evidence for metastatic disease within the chest, abdomen or pelvis.      11/13/2017 Progression    Bone Scan: Multiple new abnormal sites of increased osseous tracer accumulation are identified as above consistent with osseous metastatic disease.  These include new foci of uptake at the proximal femora bilaterally.      11/16/2017 Progression    CT CAP-IMPRESSION: 1. New enlarged right pelvic sidewall lymph node and borderline left periaortic lymph node worrisome for metastatic adenopathy. 2. Subtle areas of sclerosis within the lumbar spine are more conspicuous than on the previous exam and worrisome for metastatic disease. Overall the areas of bone metastasis are more better demonstrated on bone scan from 11/13/2017 which demonstrate a progression of bone metastasis.        HISTORY OF PRESENT ILLNESS:  (From Dr. Laverle Patter note on  07/12/17)     INTERVAL HISTORY:  Mr. Teichert 66 y.o. male returns for routine follow-up for prostate cancer.   Patient presents today for follow-up and to review his restaging scans.  He states that he has been having problems with difficulty with stream for the past few months.  He has urinary frequency especially at night however he has difficulty starting stream in his urine and also once he starts only a small amount of urine comes out.  He states he previously took Flomax and help with this problem, however he ran out of his medication and has not had a for the past few months.  He also states that he has pain in his buttocks when he sits down for which he is currently taking Aleve.  He denies any chest pain, shortness of breath, abdominal pain, nausea, vomiting, diarrhea, focal weakness, fatigue.  His appetite and energy level are at 100%.    REVIEW OF SYSTEMS:  Review of Systems  Constitutional: Negative for chills, diaphoresis, fatigue and fever.  HENT:   Negative for trouble swallowing.   Eyes: Negative.   Respiratory: Negative.  Negative for cough and shortness of breath.   Cardiovascular: Negative.   Gastrointestinal: Negative.  Negative for abdominal pain, blood in stool, constipation, diarrhea, nausea and vomiting.  Endocrine: Negative for hot flashes.  Genitourinary: Positive for difficulty urinating, frequency and nocturia. Negative for bladder incontinence, dysuria, hematuria and pelvic pain.  Musculoskeletal: Negative for arthralgias and back pain.  Skin: Negative.  Negative for rash.  Neurological: Negative for dizziness and headaches.  Hematological: Does not bruise/bleed easily.  Psychiatric/Behavioral: Negative for sleep disturbance.     PAST MEDICAL/SURGICAL HISTORY:  Past Medical History:  Diagnosis Date  . Acid reflux   . Alcoholism ([place])   . Arthritis   . Fatty liver   . Gynecomastia, male 01/11/2013   Secondary to prostate ca Tx.   . H/O Bell's palsy     . Hemorrhoids    history  . High cholesterol   . History of back injury 11/19/2013  . Hyperglycemia   . Hyperlipidemia   . Hypertension   . Prostate cancer ([place])   . Prostate cancer ([place]) 06/20/2011  . Prostate carcinoma, recurrent ([place]) 08/10/2012   Past Surgical History:  Procedure Laterality Date  . [place]   left  . COLONOSCOPY  2008  . ESOPHAGOGASTRODUODENOSCOPY    . HERNIA REPAIR  2010  . I&D EXTREMITY Left 06/10/2015   Procedure: IRRIGATION AND DEBRIDEMENT EXTREMITY LEFT HAND EXPLORATION, nerve repair;  Surgeon: Charlotte Crumb, MD;  Location: [facility];  Service: Orthopedics;  Laterality: Left;  . KNEE SURGERY     left  . PROSTATE BIOPSY  11/06     SOCIAL HISTORY:  Social History   Socioeconomic History  . Marital status: Married    Spouse name: Not on file  . Number of children: Not on file  . Years of education: Not on file  . Highest education level: Not on file  Social Needs  . Financial resource strain: Not on file  . Food insecurity - worry: Not on file  . Food insecurity - inability: Not on file  . Transportation needs - medical: Not on file  . Transportation needs - non-medical: Not on file  Occupational History  . Not on file  Tobacco Use  . Smoking status: Former Smoker    Packs/day: 2.50    Years: 3.00    Pack years: 7.50  . Smokeless tobacco: Never Used  Substance and Sexual Activity  . Alcohol use: No    Comment: former drinker 20 years ago  . Drug use: No  . Sexual activity: Not on file  Other Topics Concern  . Not on file  Social History Narrative  . Not on file    FAMILY HISTORY:  Family History  Problem Relation Age of Onset  . Heart failure Mother   . Diabetes Father   . Cancer Sister   . Diabetes Brother     CURRENT MEDICATIONS:  Outpatient Encounter Medications as of 11/17/2017  Medication Sig Note  . abiraterone acetate (ZYTIGA) 250 MG tablet Take 4 tablets (1,000 mg total) by mouth daily. Take on an empty  stomach 1 hour before or 2 hours after a meal   . amLODipine (NORVASC) 10 MG tablet Take 1 tablet (10 mg total) by mouth daily.   . cephALEXin (KEFLEX) 500 MG capsule Take 1 capsule (500 mg total) by mouth 4 (four) times daily. For 7 days   . Cholecalciferol (VITAMIN D-3) 1000 units CAPS Take 2 capsules (2,000 Units total) by mouth daily.   Marland Kitchen denosumab (PROLIA) 60 MG/ML SOLN injection Inject 60 mg into the skin every 6 (six) months. Administer in upper arm, thigh, or abdomen   . diclofenac (VOLTAREN) 75 MG EC tablet Take 1 tablet (75 mg total) by mouth 2 (two) times daily.   . diphenhydrAMINE (BENADRYL) 25 MG tablet  Take 25 mg by mouth every 6 (six) hours as needed.   Marland Kitchen Leuprolide Acetate ([name] IJ) Inject as directed every 3 (three) months. 10/30/2015: DUE FOR INJECTION 11/2015   . meloxicam (MOBIC) 15 MG tablet Take 1 tablet (15 mg total) by mouth daily.   . predniSONE (DELTASONE) 5 MG tablet Take 1 tablet (5 mg total) by mouth 2 (two) times daily with a meal.   . tamsulosin (FLOMAX) 0.4 MG CAPS capsule Take 1 capsule (0.4 mg total) by mouth daily.   . traMADol (ULTRAM) 50 MG tablet Take 1 tablet (50 mg total) by mouth every 12 (twelve) hours as needed.   . [DISCONTINUED] bicalutamide (CASODEX) 50 MG tablet Take 1 tablet (50 mg total) by mouth daily.    No facility-administered encounter medications on file as of 11/17/2017.     ALLERGIES:  Allergies  Allergen Reactions  . Tylenol [Acetaminophen]     Told not to take this because of problems with the cancer medications.  . Tramadol Rash    Pt said it gave him blisters     PHYSICAL EXAM:  ECOG Performance status: 1-2 - Symptomatic; may require occasional assistance  Vitals:   11/17/17 1511  BP: 135/87  Pulse: 97  Resp: 16  SpO2: 100%   There were no vitals filed for this visit.  Physical Exam  Constitutional: He is oriented to person, place, and time and well-developed, well-nourished, and in no distress.  HENT:  Head:  Normocephalic.  Mouth/Throat: Oropharynx is clear and moist. No oropharyngeal exudate.  Eyes: Pupils are equal, round, and reactive to light. No scleral icterus.  Neck: Normal range of motion. Neck supple.  Cardiovascular: Normal rate and regular rhythm.  Pulmonary/Chest: Effort normal and breath sounds normal. No respiratory distress. He has no wheezes.  Abdominal: Soft. Bowel sounds are normal. He exhibits no distension. There is no tenderness.  Musculoskeletal: Normal range of motion. He exhibits no edema.  Lymphadenopathy:    He has no cervical adenopathy.       Right: No supraclavicular adenopathy present.       Left: No supraclavicular adenopathy present.  Neurological: He is alert and oriented to person, place, and time. No cranial nerve deficit.  Skin: Skin is warm and dry. No rash noted.  Psychiatric: Mood and affect normal.  Nursing note and vitals reviewed.    LABORATORY DATA:  I have reviewed the labs as listed.  CBC    Component Value Date/Time   WBC 7.5 11/08/2017 1048   RBC 4.37 11/08/2017 1048   HGB 13.2 11/08/2017 1048   HGB 12.4 (L) 05/19/2016 1623   HCT 40.4 11/08/2017 1048   HCT 36.1 (L) 05/19/2016 1623   PLT 371 11/08/2017 1048   PLT 369 05/19/2016 1623   MCV 92.4 11/08/2017 1048   MCV 90 05/19/2016 1623   MCH 30.2 11/08/2017 1048   MCHC 32.7 11/08/2017 1048   RDW 12.6 11/08/2017 1048   RDW 13.3 05/19/2016 1623   LYMPHSABS 1.7 11/08/2017 1048   LYMPHSABS 2.3 05/19/2016 1623   MONOABS 0.5 11/08/2017 1048   EOSABS 0.4 11/08/2017 1048   EOSABS 0.3 05/19/2016 1623   BASOSABS 0.0 11/08/2017 1048   BASOSABS 0.0 05/19/2016 1623   CMP Latest Ref Rng & Units 11/08/2017 09/19/2017 07/12/2017  Glucose 65 - 99 mg/dL 149(H) 102(H) 115(H)  BUN 6 - 20 mg/dL 24(H) 23(H) 17  Creatinine 0.61 - 1.24 mg/dL 0.82 0.75 0.79  Sodium 135 - 145 mmol/L 138 141 138  Potassium  3.5 - 5.1 mmol/L 3.7 3.8 3.8  Chloride 101 - 111 mmol/L 106 108 103  CO2 22 - 32 mmol/L 23 24 25     Calcium 8.9 - 10.3 mg/dL 9.1 9.2 9.1  Total Protein 6.5 - 8.1 g/dL 7.4 7.3 7.3  Total Bilirubin 0.3 - 1.2 mg/dL 0.5 0.4 0.6  Alkaline Phos 38 - 126 U/L 77 56 49  AST 15 - 41 U/L 22 21 19   ALT 17 - 63 U/L 18 16(L) 19       PENDING LABS:    DIAGNOSTIC IMAGING:  *The following radiologic images and reports have been reviewed independently and agree with below findings.  CT chest/abd/pelvis: 09/27/16 CLINICAL DATA:  Followup prostate cancer  EXAM: CT CHEST, ABDOMEN, AND PELVIS WITH CONTRAST  TECHNIQUE: Multidetector CT imaging of the chest, abdomen and pelvis was performed following the standard protocol during bolus administration of intravenous contrast.  CONTRAST:  159mL ISOVUE-300 IOPAMIDOL (ISOVUE-300) INJECTION 61%  COMPARISON:  03/16/2016  FINDINGS: CT CHEST FINDINGS  Cardiovascular: No significant vascular findings. Normal heart size. No pericardial effusion.  Mediastinum/Nodes: No enlarged mediastinal, hilar, or axillary lymph nodes. Thyroid gland, trachea, and esophagus demonstrate no significant findings.  Lungs/Pleura: Lungs are clear. No pleural effusion or pneumothorax.  Musculoskeletal: No chest wall mass or suspicious bone lesions identified.  CT ABDOMEN PELVIS FINDINGS  Hepatobiliary: No focal liver abnormality is seen. No gallstones, gallbladder wall thickening, or biliary dilatation.  Pancreas: Unremarkable. No pancreatic ductal dilatation or surrounding inflammatory changes.  Spleen: Normal in size without focal abnormality.  Adrenals/Urinary Tract: Normal adrenal glands. Small cyst arising from the posterior left kidney measures 1.5 cm and is unchanged from previous exam. No mass or hydronephrosis. The urinary bladder appears normal. Patent ureters.  Stomach/Bowel: Stomach is within normal limits. Appendix appears normal. No evidence of bowel wall thickening, distention, or inflammatory changes.  Vascular/Lymphatic:  Calcified atherosclerotic disease involves the abdominal aorta. No aneurysm. No enlarged retroperitoneal or mesenteric adenopathy. No enlarged pelvic or inguinal lymph nodes.  Reproductive: Fiducial markers noted within the prostate bed. No mass identified.  Other: There is no ascites or focal fluid collections within the abdomen or pelvis.  Musculoskeletal: No aggressive lytic or sclerotic bone lesions. Degenerative disc disease noted within the lumbar spine.  IMPRESSION: 1. No acute findings and no evidence for metastatic disease within the chest, abdomen or pelvis.   Electronically Signed   By: Kerby Moors M.D.   On: 09/27/2016 16:22   Bone scan: 06/17/16 CLINICAL DATA:  Prostate cancer with elevated PSA.  EXAM: NUCLEAR MEDICINE WHOLE BODY BONE SCAN  TECHNIQUE: Whole body anterior and posterior images were obtained approximately 3 hours after intravenous injection of radiopharmaceutical.  RADIOPHARMACEUTICALS:  23.8 mCi Technetium-69m MDP IV  COMPARISON:  02/11/2015  FINDINGS: Normal distribution of tracer with visible kidneys and bladder. Negative for metastatic pattern. Increased activity around chronic bilateral L5 pars defects. Subtle increased activity left ischial tuberosity has no correlate on recent abdomen pelvis CT. Mildly heterogeneous calvarial activity is stable since 2011. Degenerative activity to the shoulders, hands, knees, and feet.  IMPRESSION: 1. Negative for metastatic pattern. 2. Increased degenerative activity at chronic L5 pars defects.   Electronically Signed   By: Monte Fantasia M.D.   On: 06/17/2016 12:50   DEXA scan: 01/12/16 EXAM: DUAL X-RAY ABSORPTIOMETRY (DXA) FOR BONE MINERAL DENSITY  IMPRESSION: Ordering Physician:  Dr. Patrici Ranks,  Your patient Khadeem Rockett completed a BMD test on 01/12/2016 using the [place] (software  version: 14.10) manufactured by [employer]. The following summarizes the results of our evaluation. PATIENT BIOGRAPHICAL: Name: ALDIN, DREES Patient ID: [id number] Birth Date: 1951-03-13 Height: 66.0 in. Gender: Male Exam Date: 01/12/2016 Weight: 210.0 lbs. Indications: Follow up Osteopenia, Height Loss, History of Fracture (Adult), Low Calcium Intake, Prostate Cancer, Recurrent Falls Fractures: Wrist Treatments: Multivitamin DENSITOMETRY RESULTS: Site      Region    Measured Date Measured Age WHO Classification Young Adult T-score BMD         %Change vs. Previous Significant Change (*) AP Spine L1-L4 01/12/2016 62.6 N/A -0.4 1.171 g/cm2 5.1% - AP Spine L1-L4 02/24/2014 60.7 N/A -0.9 1.114 g/cm2 - -  DualFemur Neck Left 01/12/2016 62.6 N/A -1.8 0.838 g/cm2 -3.1% - DualFemur Neck Left 02/24/2014 60.7 N/A -1.6 0.865 g/cm2 - - ASSESSMENT: This patient is considered osteopenic by World Healh Organization (WHO) Criteria. Compared with the prior study on 02/24/2014, the BMD of the (lumbar spine/total hip/femoral neck) show (no statistically significant change ).    PATHOLOGY:     ASSESSMENT & PLAN:   Stage IIC biochemically recurrent prostate cancer, now with metastatic disease to his bone and pelvic lymph node:  -Found to have biochemical recurrence in 2012 and was started on [name] at that time. Casodex was added in 2015. His PSA began to rise and Casodex was dicontinued in 05/2016 to try to drive PSA back down. However, PSA continued to climb at that time. Therefore, Casodex was resumed in 09/2016 to try to re-challenge response.  Testosterone levels have been in castrate range.  He has continued on [name] injections every 3 months.   -PSA trends were declining and have begun to rise again. PSA 1.77 in 03/2017; PSA 3 in 07/2017; PSA 5.15 in 09/2017. PSA doubling time ~4-6 months.  Discussed with Dr. Talbert Cage.  Given his continued increase in PSA, will obtain restaging imaging with CT chest/abd/pelvis and bone scan as  soon as able.   -I have reviewed patient's CT chest abdomen pelvis as well as bone scan in detail with him today.  He now has metastatic disease with multiple osseous bone lesions on his bone scan as well as new pelvic lymph node on his CT chest abdomen pelvis. -I have advised him that now that he has metastatic disease is no longer curable however there is treatment for his prostate cancer.  I have discussed placing him on Zytiga with prednisone daily and have reviewed side effects of Zytiga and prednisone in detail with him today.  We will get him financial assistance for the Zytiga.  I have advised him not to take the prednisone until he gets his Zytiga in the mail. -Plan to see him back in 6 months to assess tolerance to Zytiga and prednisone. -Most recent testosterone level shows that patient is at castrate levels.  PSA had gone up from 1.77 in April 2018 to 10.64 in November 2018.  We will repeat his testosterone and PSA level on his next visit. -I have advised him to stop Casodex now that he is going to start Zytiga and prednisone. -Continue [name] every 3 months. -Patient has metastatic bone disease I will start him on monthly Xgeva have reviewed side effects of Xgeva in detail with the patient today.  He last received Prolia in August 2018, therefore will plan to start his Delton See on his next visit.  Urinary frequency/Incomplete bladder emptying: -Likely due to previous radiation therapy. -I have refilled his Flomax.  Recommended he follow-up  with urology at his scheduled appointment in January 2019.  Financial concerns: -I will ask Durene Cal, our patient advocate, to see patient today to discuss possible resources available.   Bone health:  -Last DEXA scan in 12/2015 showed osteopenia with T-score -1.8.  -Biennial DEXA scan scheduled for 12/21/2017  -Previously on prolia, last given on 07/12/17. Will switch him to monthly xgeva, first dose to be given on next visit.  Dispo:  -RTC in 6  weeks for follow up with labs.  Orders placed this encounter:  Orders Placed This Encounter  Procedures  . CBC with Differential  . Comprehensive metabolic panel  . PSA  . Testosterone    All questions were answered to patient's stated satisfaction. Encouraged patient to call with any new concerns or questions before his next visit to the cancer center and we can certain see him sooner, if needed.    Twana First, MD

## 2017-11-17 NOTE — Patient Instructions (Signed)
[place] at [facility]  Discharge Instructions:  Seen by dr. Talbert Cage today _______________________________________________________________  Thank you for choosing [place] at [facility] to provide your oncology and hematology care.  To afford each patient quality time with our providers, please arrive at least 15 minutes before your scheduled appointment.  You need to re-schedule your appointment if you arrive 10 or more minutes late.  We strive to give you quality time with our providers, and arriving late affects you and other patients whose appointments are after yours.  Also, if you no show three or more times for appointments you may be dismissed from the clinic.  Again, thank you for choosing [place] at [place] hope is that these requests will allow you access to exceptional care and in a timely manner. _______________________________________________________________  If you have questions after your visit, please contact our office at [phone] between the hours of 8:30 a.m. and 5:00 p.m. Voicemails left after 4:30 p.m. will not be returned until the following business day. _______________________________________________________________  For prescription refill requests, have your pharmacy contact our office. _______________________________________________________________  Recommendations made by the consultant and any test results will be sent to your referring physician. _______________________________________________________________

## 2017-11-21 ENCOUNTER — Other Ambulatory Visit (HOSPITAL_COMMUNITY): Payer: Self-pay | Admitting: *Deleted

## 2017-11-21 ENCOUNTER — Telehealth (HOSPITAL_COMMUNITY): Payer: Self-pay | Admitting: Oncology

## 2017-11-21 DIAGNOSIS — C61 Malignant neoplasm of prostate: Secondary | ICD-10-CM

## 2017-11-21 NOTE — Telephone Encounter (Signed)
FAXED ZYTIGA SCRIPT TO [facility] °

## 2017-11-22 ENCOUNTER — Encounter (HOSPITAL_BASED_OUTPATIENT_CLINIC_OR_DEPARTMENT_OTHER): Payer: Medicare Other

## 2017-11-22 ENCOUNTER — Encounter (HOSPITAL_COMMUNITY): Payer: Medicare Other

## 2017-11-22 ENCOUNTER — Encounter (HOSPITAL_COMMUNITY): Payer: Self-pay

## 2017-11-22 VITALS — BP 148/86 | HR 83 | Temp 97.6°F | Resp 20

## 2017-11-22 DIAGNOSIS — C61 Malignant neoplasm of prostate: Secondary | ICD-10-CM

## 2017-11-22 DIAGNOSIS — Z9221 Personal history of antineoplastic chemotherapy: Secondary | ICD-10-CM | POA: Diagnosis not present

## 2017-11-22 DIAGNOSIS — Z791 Long term (current) use of non-steroidal anti-inflammatories (NSAID): Secondary | ICD-10-CM | POA: Diagnosis not present

## 2017-11-22 DIAGNOSIS — M8580 Other specified disorders of bone density and structure, unspecified site: Secondary | ICD-10-CM

## 2017-11-22 DIAGNOSIS — Z809 Family history of malignant neoplasm, unspecified: Secondary | ICD-10-CM | POA: Diagnosis not present

## 2017-11-22 DIAGNOSIS — F1020 Alcohol dependence, uncomplicated: Secondary | ICD-10-CM | POA: Diagnosis not present

## 2017-11-22 DIAGNOSIS — R339 Retention of urine, unspecified: Secondary | ICD-10-CM | POA: Diagnosis not present

## 2017-11-22 DIAGNOSIS — Z8546 Personal history of malignant neoplasm of prostate: Secondary | ICD-10-CM | POA: Diagnosis not present

## 2017-11-22 DIAGNOSIS — E7800 Pure hypercholesterolemia, unspecified: Secondary | ICD-10-CM | POA: Diagnosis not present

## 2017-11-22 DIAGNOSIS — I10 Essential (primary) hypertension: Secondary | ICD-10-CM | POA: Diagnosis not present

## 2017-11-22 DIAGNOSIS — Z923 Personal history of irradiation: Secondary | ICD-10-CM | POA: Diagnosis not present

## 2017-11-22 DIAGNOSIS — Z79891 Long term (current) use of opiate analgesic: Secondary | ICD-10-CM | POA: Diagnosis not present

## 2017-11-22 DIAGNOSIS — Z87891 Personal history of nicotine dependence: Secondary | ICD-10-CM | POA: Diagnosis not present

## 2017-11-22 DIAGNOSIS — R531 Weakness: Secondary | ICD-10-CM | POA: Diagnosis not present

## 2017-11-22 DIAGNOSIS — Z833 Family history of diabetes mellitus: Secondary | ICD-10-CM | POA: Diagnosis not present

## 2017-11-22 DIAGNOSIS — K219 Gastro-esophageal reflux disease without esophagitis: Secondary | ICD-10-CM | POA: Diagnosis not present

## 2017-11-22 DIAGNOSIS — Z79899 Other long term (current) drug therapy: Secondary | ICD-10-CM | POA: Diagnosis not present

## 2017-11-22 DIAGNOSIS — Z8249 Family history of ischemic heart disease and other diseases of the circulatory system: Secondary | ICD-10-CM | POA: Diagnosis not present

## 2017-11-22 DIAGNOSIS — Z888 Allergy status to other drugs, medicaments and biological substances status: Secondary | ICD-10-CM | POA: Diagnosis not present

## 2017-11-22 DIAGNOSIS — G8929 Other chronic pain: Secondary | ICD-10-CM | POA: Diagnosis not present

## 2017-11-22 DIAGNOSIS — Z9889 Other specified postprocedural states: Secondary | ICD-10-CM | POA: Diagnosis not present

## 2017-11-22 LAB — CBC WITH DIFFERENTIAL/PLATELET
Basophils Absolute: 0 10*3/uL (ref 0.0–0.1)
Basophils Relative: 1 %
EOS ABS: 0.3 10*3/uL (ref 0.0–0.7)
EOS PCT: 3 %
HCT: 39.4 % (ref 39.0–52.0)
Hemoglobin: 12.9 g/dL — ABNORMAL LOW (ref 13.0–17.0)
LYMPHS ABS: 2 10*3/uL (ref 0.7–4.0)
Lymphocytes Relative: 23 %
MCH: 30.3 pg (ref 26.0–34.0)
MCHC: 32.7 g/dL (ref 30.0–36.0)
MCV: 92.5 fL (ref 78.0–100.0)
MONO ABS: 0.7 10*3/uL (ref 0.1–1.0)
Monocytes Relative: 8 %
Neutro Abs: 5.8 10*3/uL (ref 1.7–7.7)
Neutrophils Relative %: 65 %
Platelets: 396 10*3/uL (ref 150–400)
RBC: 4.26 MIL/uL (ref 4.22–5.81)
RDW: 12.7 % (ref 11.5–15.5)
WBC: 8.8 10*3/uL (ref 4.0–10.5)

## 2017-11-22 LAB — COMPREHENSIVE METABOLIC PANEL
ALT: 16 U/L — AB (ref 17–63)
AST: 18 U/L (ref 15–41)
Albumin: 3.9 g/dL (ref 3.5–5.0)
Alkaline Phosphatase: 90 U/L (ref 38–126)
Anion gap: 8 (ref 5–15)
BILIRUBIN TOTAL: 0.6 mg/dL (ref 0.3–1.2)
BUN: 25 mg/dL — AB (ref 6–20)
CHLORIDE: 103 mmol/L (ref 101–111)
CO2: 26 mmol/L (ref 22–32)
CREATININE: 0.8 mg/dL (ref 0.61–1.24)
Calcium: 9.5 mg/dL (ref 8.9–10.3)
Glucose, Bld: 117 mg/dL — ABNORMAL HIGH (ref 65–99)
POTASSIUM: 3.7 mmol/L (ref 3.5–5.1)
Sodium: 137 mmol/L (ref 135–145)
TOTAL PROTEIN: 7.3 g/dL (ref 6.5–8.1)

## 2017-11-22 LAB — PSA: PROSTATIC SPECIFIC ANTIGEN: 17.45 ng/mL — AB (ref 0.00–4.00)

## 2017-11-22 MED ORDER — DENOSUMAB 120 MG/1.7ML ~~LOC~~ SOLN
120.0000 mg | Freq: Once | SUBCUTANEOUS | Status: AC
  Administered 2017-11-22: 120 mg via SUBCUTANEOUS
  Filled 2017-11-22: qty 1.7

## 2017-11-22 NOTE — Patient Instructions (Signed)
[facility] at [facility] Discharge Instructions  RECOMMENDATIONS MADE BY THE CONSULTANT AND ANY TEST RESULTS WILL BE SENT TO YOUR REFERRING PHYSICIAN.  Received Xgeva injection today. Follow-up as scheduled. Call clinic for any questions or concerns  Thank you for choosing [facility] at [facility] to provide your oncology and hematology care.  To afford each patient quality time with our provider, please arrive at least 15 minutes before your scheduled appointment time.    If you have a lab appointment with the Cancer Center please come in thru the  Main Entrance and check in at the main information desk  You need to re-schedule your appointment should you arrive 10 or more minutes late.  We strive to give you quality time with our providers, and arriving late affects you and other patients whose appointments are after yours.  Also, if you no show three or more times for appointments you may be dismissed from the clinic at the providers discretion.     Again, thank you for choosing [facility].  Our hope is that these requests will decrease the amount of time that you wait before being seen by our physicians.       _____________________________________________________________  Should you have questions after your visit to [facility], please contact our office at [phone] between the hours of 8:30 a.m. and 4:30 p.m.  Voicemails left after 4:30 p.m. will not be returned until the following business day.  For prescription refill requests, have your pharmacy contact our office.       Resources For Cancer Patients and their Caregivers ? American Cancer Society: Can assist with transportation, wigs, general needs, runs Look Good Feel Better.        1-888-227-6333 ? Cancer Care: Provides financial assistance, online support groups, medication/co-pay assistance.  1-800-813-HOPE (4673) ? [facility] Assists [place] cancer patients and their families through emotional , educational and financial support.  336-427-4357 ? [place] DSS Where to apply for food stamps, Medicaid and utility assistance. 336-342-1394 ? [facility]: Transportation to medical appointments. 336-347-2287 ? Social Security Administration: May apply for disability if have a Stage IV cancer. 336-342-7796 1-800-772-1213 ? [place] Aging, Disability and Transit Services: Assists with nutrition, care and transit needs. 336-349-2343  Cancer Center Support Programs: @10RELATIVEDAYS@ > Cancer Support Group  2nd Tuesday of the month 1pm-2pm, Journey Room  > Creative Journey  3rd Tuesday of the month 1130am-1pm, Journey Room  > Look Good Feel Better  1st Wednesday of the month 10am-12 noon, Journey Room (Call American Cancer Society to register 1-800-395-5775)   

## 2017-11-22 NOTE — Progress Notes (Signed)
Walter Boone tolerated Xgeva injection well without complaints or incident Calcium 9.5 today and pt denied any tooth,jaw or leg pain as well as any recent or future dental appts prior to administering this medication. Reviewed purpose,administration and side effects of the Xgeva with pt who verbalized understanding and written information given to pt also. Permit for [employer] signed as well.VSS Pt discharged self ambulatory in satisfactory condition

## 2017-11-23 ENCOUNTER — Telehealth (HOSPITAL_COMMUNITY): Payer: Self-pay | Admitting: Oncology

## 2017-11-23 NOTE — Telephone Encounter (Signed)
PER [employer] REP RX IS STILL IN PROCESS. TAKES 24-72 HRS AUTH PENDING AS WELL

## 2017-11-30 ENCOUNTER — Encounter: Payer: Self-pay | Admitting: Family Medicine

## 2017-11-30 ENCOUNTER — Ambulatory Visit (INDEPENDENT_AMBULATORY_CARE_PROVIDER_SITE_OTHER): Payer: Medicare Other | Admitting: Family Medicine

## 2017-11-30 VITALS — BP 119/78 | HR 104 | Temp 97.9°F | Ht 65.0 in | Wt 206.0 lb

## 2017-11-30 DIAGNOSIS — C7951 Secondary malignant neoplasm of bone: Secondary | ICD-10-CM

## 2017-11-30 DIAGNOSIS — G8929 Other chronic pain: Secondary | ICD-10-CM

## 2017-11-30 DIAGNOSIS — M5441 Lumbago with sciatica, right side: Secondary | ICD-10-CM | POA: Diagnosis not present

## 2017-11-30 DIAGNOSIS — R7303 Prediabetes: Secondary | ICD-10-CM | POA: Diagnosis not present

## 2017-11-30 DIAGNOSIS — E782 Mixed hyperlipidemia: Secondary | ICD-10-CM

## 2017-11-30 DIAGNOSIS — I10 Essential (primary) hypertension: Secondary | ICD-10-CM | POA: Diagnosis not present

## 2017-11-30 MED ORDER — DULOXETINE HCL 30 MG PO CPEP: 30.0000 mg | ORAL_CAPSULE | Freq: Every day | ORAL | 1 refills | Status: DC

## 2017-11-30 MED ORDER — TRAMADOL HCL 50 MG PO TABS: 50.0000 mg | ORAL_TABLET | Freq: Four times a day (QID) | ORAL | 2 refills | Status: DC | PRN

## 2017-11-30 NOTE — Progress Notes (Signed)
BP 119/78   Pulse (!) 104   Temp 97.9 F (36.6 C) (Oral)   Ht 5\' 5"  (1.651 m)   Wt 206 lb (93.4 kg)   BMI 34.28 kg/m    Subjective:    [name], male    DOB: 08/30/1951, 66 y.o.   MRN: [id number]  HPI: Walter Boone is a 66 y.o. male presenting on 11/30/2017 for Refill Tramadol   HPI Prostate cancer that has metastasized to his bones Patient is having increased pain because he has prostate cancer that is now metastasized to his bone.  They have switched the medications that he is going on for this and are trying to suppress it more but he is still waiting for 1 of the medications to.  He says is just having a lot of pain throughout his body because of where the cancer is gotten into his bones.  He did say that the tramadol was working but he is out of it and needs more of it.  Hypertension Patient is currently on amlodipine, and their blood pressure today is 119/78. Patient denies any lightheadedness or dizziness. Patient denies headaches, blurred vision, chest pains, shortness of breath, or weakness. Denies any side effects from medication and is content with current medication.   Hyperlipidemia Patient is coming in for recheck of his hyperlipidemia. The patient is currently taking no medication for it and we are monitoring. They deny any issues with myalgias or history of liver damage from it. They deny any focal numbness or weakness or chest pain.   Prediabetes Patient comes in today for recheck of his diabetes. Patient has been currently taking no medication and we are monitoring especially while he is on prednisone. Patient is not currently on an ACE inhibitor/ARB. Patient has not seen an ophthalmologist this year. Patient denies any issues with their feet.   Relevant past medical, surgical, family and social history reviewed and updated as indicated. Interim medical history since our last visit reviewed. Allergies and medications reviewed and  updated.  Review of Systems  Constitutional: Negative for chills and fever.  Respiratory: Negative for shortness of breath and wheezing.   Cardiovascular: Negative for chest pain and leg swelling.  Musculoskeletal: Positive for arthralgias and back pain. Negative for gait problem and joint swelling.  Skin: Negative for rash.  Neurological: Negative for dizziness, weakness, light-headedness, numbness and headaches.  All other systems reviewed and are negative.   Per HPI unless specifically indicated above        Objective:    BP 119/78   Pulse (!) 104   Temp 97.9 F (36.6 C) (Oral)   Ht 5\' 5"  (1.651 m)   Wt 206 lb (93.4 kg)   BMI 34.28 kg/m   Wt Readings from Last 3 Encounters:  11/30/17 206 lb (93.4 kg)  11/08/17 203 lb 1.6 oz (92.1 kg)  08/09/17 206 lb (93.4 kg)    Physical Exam  Constitutional: He is oriented to person, place, and time. He appears well-developed and well-nourished. No distress.  Eyes: Conjunctivae are normal. No scleral icterus.  Neck: Neck supple. No thyromegaly present.  Cardiovascular: Normal rate, regular rhythm, normal heart sounds and intact distal pulses.  No murmur heard. Pulmonary/Chest: Effort normal and breath sounds normal. No respiratory distress. He has no wheezes.  Musculoskeletal: Normal range of motion. He exhibits no edema or tenderness (No tenderness on exam, he says pain is deep in his bones).  Lymphadenopathy:    He has no  cervical adenopathy.  Neurological: He is alert and oriented to person, place, and time. Coordination normal.  Skin: Skin is warm and dry. No rash noted. He is not diaphoretic.  Psychiatric: He has a normal mood and affect. His behavior is normal.  Nursing note and vitals reviewed.       Assessment & Plan:   Problem List Items Addressed This Visit      Cardiovascular and Mediastinum   Hypertension     Nervous and Auditory   Chronic right-sided low back pain with right-sided sciatica   Relevant  Medications   traMADol (ULTRAM) 50 MG tablet   DULoxetine (CYMBALTA) 30 MG capsule     Other   Hyperlipidemia   Prediabetes - Primary   Relevant Orders   Bayer DCA Hb A1c Waived    Other Visit Diagnoses    Bone metastases ([place])       From prostate cancer       Follow up plan: Return in about 4 weeks (around 12/28/2017), or if symptoms worsen or fail to improve, for pain recheck.  Counseling provided for all of the vaccine components Orders Placed This Encounter  Procedures  . Bayer [facility] Hb A1c Waived    Caryl Pina, MD [facility] 11/30/2017, 2:06 PM

## 2017-12-01 ENCOUNTER — Other Ambulatory Visit (HOSPITAL_COMMUNITY): Payer: Self-pay | Admitting: Oncology

## 2017-12-01 DIAGNOSIS — C61 Malignant neoplasm of prostate: Secondary | ICD-10-CM

## 2017-12-01 MED ORDER — ABIRATERONE ACETATE 250 MG PO TABS: 1000.0000 mg | ORAL_TABLET | Freq: Every day | ORAL | 0 refills | Status: DC

## 2017-12-15 ENCOUNTER — Other Ambulatory Visit: Payer: Self-pay | Admitting: Oncology

## 2017-12-15 DIAGNOSIS — C61 Malignant neoplasm of prostate: Secondary | ICD-10-CM

## 2017-12-15 MED ORDER — ABIRATERONE ACETATE 250 MG PO TABS: 1000.0000 mg | ORAL_TABLET | Freq: Every day | ORAL | 0 refills | Status: DC

## 2017-12-20 ENCOUNTER — Inpatient Hospital Stay (HOSPITAL_COMMUNITY): Payer: Medicare Other

## 2017-12-20 ENCOUNTER — Telehealth (HOSPITAL_COMMUNITY): Payer: Self-pay | Admitting: Adult Health

## 2017-12-20 ENCOUNTER — Encounter (HOSPITAL_COMMUNITY): Payer: Self-pay

## 2017-12-20 ENCOUNTER — Inpatient Hospital Stay (HOSPITAL_COMMUNITY): Payer: Medicare Other | Attending: Oncology

## 2017-12-20 VITALS — BP 118/71 | HR 87 | Temp 98.3°F | Resp 20

## 2017-12-20 DIAGNOSIS — Z79899 Other long term (current) drug therapy: Secondary | ICD-10-CM | POA: Diagnosis not present

## 2017-12-20 DIAGNOSIS — C61 Malignant neoplasm of prostate: Secondary | ICD-10-CM | POA: Insufficient documentation

## 2017-12-20 DIAGNOSIS — Z79818 Long term (current) use of other agents affecting estrogen receptors and estrogen levels: Secondary | ICD-10-CM | POA: Insufficient documentation

## 2017-12-20 DIAGNOSIS — M8580 Other specified disorders of bone density and structure, unspecified site: Secondary | ICD-10-CM

## 2017-12-20 LAB — CBC WITH DIFFERENTIAL/PLATELET
Basophils Absolute: 0 10*3/uL (ref 0.0–0.1)
Basophils Relative: 0 %
Eosinophils Absolute: 0.2 10*3/uL (ref 0.0–0.7)
Eosinophils Relative: 2 %
HEMATOCRIT: 35.7 % — AB (ref 39.0–52.0)
HEMOGLOBIN: 11.5 g/dL — AB (ref 13.0–17.0)
LYMPHS ABS: 2 10*3/uL (ref 0.7–4.0)
LYMPHS PCT: 23 %
MCH: 29.3 pg (ref 26.0–34.0)
MCHC: 32.2 g/dL (ref 30.0–36.0)
MCV: 90.8 fL (ref 78.0–100.0)
MONO ABS: 0.9 10*3/uL (ref 0.1–1.0)
MONOS PCT: 10 %
NEUTROS ABS: 5.9 10*3/uL (ref 1.7–7.7)
NEUTROS PCT: 66 %
Platelets: 429 10*3/uL — ABNORMAL HIGH (ref 150–400)
RBC: 3.93 MIL/uL — ABNORMAL LOW (ref 4.22–5.81)
RDW: 12.5 % (ref 11.5–15.5)
WBC: 8.9 10*3/uL (ref 4.0–10.5)

## 2017-12-20 LAB — COMPREHENSIVE METABOLIC PANEL
ALK PHOS: 174 U/L — AB (ref 38–126)
ALT: 16 U/L — ABNORMAL LOW (ref 17–63)
ANION GAP: 10 (ref 5–15)
AST: 20 U/L (ref 15–41)
Albumin: 3.7 g/dL (ref 3.5–5.0)
BILIRUBIN TOTAL: 0.4 mg/dL (ref 0.3–1.2)
BUN: 19 mg/dL (ref 6–20)
CALCIUM: 8.9 mg/dL (ref 8.9–10.3)
CO2: 24 mmol/L (ref 22–32)
Chloride: 104 mmol/L (ref 101–111)
Creatinine, Ser: 0.85 mg/dL (ref 0.61–1.24)
GFR calc Af Amer: >60 mL/min (ref >60)
Glucose, Bld: 108 mg/dL — ABNORMAL HIGH (ref 65–99)
POTASSIUM: 4.3 mmol/L (ref 3.5–5.1)
Sodium: 138 mmol/L (ref 135–145)
TOTAL PROTEIN: 7.5 g/dL (ref 6.5–8.1)

## 2017-12-20 MED ORDER — LEUPROLIDE ACETATE (3 MONTH) 22.5 MG IM KIT
22.5000 mg | PACK | Freq: Once | INTRAMUSCULAR | Status: AC
  Administered 2017-12-20: 22.5 mg via INTRAMUSCULAR
  Filled 2017-12-20: qty 22.5

## 2017-12-20 MED ORDER — DENOSUMAB 120 MG/1.7ML ~~LOC~~ SOLN
120.0000 mg | Freq: Once | SUBCUTANEOUS | Status: AC
  Administered 2017-12-20: 120 mg via SUBCUTANEOUS
  Filled 2017-12-20: qty 1.7

## 2017-12-20 NOTE — Telephone Encounter (Signed)
Contacted [facility] to check the status of pts Zytiga. Per Pat/csr they have been trying to set up delivery but the pt has not answered or returned their call. copay is $3.40

## 2017-12-20 NOTE — Patient Instructions (Signed)
[place] [facility]  Discharge Instructions:  You received xgeva and lupron today.  _______________________________________________________________  Thank you for choosing [place] at [facility] to provide your oncology and hematology care.  To afford each patient quality time with our providers, please arrive at least 15 minutes before your scheduled appointment.  You need to re-schedule your appointment if you arrive 10 or more minutes late.  We strive to give you quality time with our providers, and arriving late affects you and other patients whose appointments are after yours.  Also, if you no show three or more times for appointments you may be dismissed from the clinic.  Again, thank you for choosing [place] [facility] [place] hope is that these requests will allow you access to exceptional care and in a timely manner. _______________________________________________________________  If you have questions after your visit, please contact our office at [phone] between the hours of 8:30 a.m. and 5:00 p.m. Voicemails left after 4:30 p.m. will not be returned until the following business day. _______________________________________________________________  For prescription refill requests, have your pharmacy contact our office. _______________________________________________________________  Recommendations made by the consultant and any test results will be sent to your referring physician. _______________________________________________________________

## 2017-12-20 NOTE — Progress Notes (Signed)
Patient stated his zytiga will be delivered this Friday by [employer].  This will be a new medication for the patient.  Reviewed directions and side effects with the patient and when to call the office. Patient also has his prednisone at home.  Patient verbalized understanding with medications.   Patient tolerated shots with no complaints voiced.  Both sites clean and dry with no bruising or swelling noted at site.  Band aids applied.  VSS with discharge and left ambulatory with family.  No s/s of distress noted.

## 2017-12-20 NOTE — Telephone Encounter (Signed)
I called the pt he said he just spoke with [facility] and they are going to deliver it on Friday.

## 2017-12-21 ENCOUNTER — Ambulatory Visit (HOSPITAL_COMMUNITY)
Admission: RE | Admit: 2017-12-21 | Discharge: 2017-12-21 | Disposition: A | Discharge status: Home / Self Care | Payer: Medicare Other | Source: Ambulatory Visit | Attending: Adult Health | Admitting: Adult Health

## 2017-12-21 DIAGNOSIS — Z79899 Other long term (current) drug therapy: Secondary | ICD-10-CM | POA: Diagnosis not present

## 2017-12-21 DIAGNOSIS — M8580 Other specified disorders of bone density and structure, unspecified site: Secondary | ICD-10-CM | POA: Diagnosis present

## 2017-12-21 DIAGNOSIS — M85852 Other specified disorders of bone density and structure, left thigh: Secondary | ICD-10-CM | POA: Diagnosis not present

## 2017-12-21 DIAGNOSIS — Z13820 Encounter for screening for osteoporosis: Secondary | ICD-10-CM | POA: Diagnosis not present

## 2017-12-21 LAB — PSA: Prostatic Specific Antigen: 29.84 ng/mL — ABNORMAL HIGH (ref 0.00–4.00)

## 2017-12-29 ENCOUNTER — Encounter: Payer: Self-pay | Admitting: Family Medicine

## 2017-12-29 ENCOUNTER — Ambulatory Visit (INDEPENDENT_AMBULATORY_CARE_PROVIDER_SITE_OTHER): Payer: Medicare Other | Admitting: Family Medicine

## 2017-12-29 VITALS — BP 126/82 | HR 90 | Temp 97.9°F | Ht 65.0 in | Wt 199.0 lb

## 2017-12-29 DIAGNOSIS — C61 Malignant neoplasm of prostate: Secondary | ICD-10-CM | POA: Diagnosis not present

## 2017-12-29 DIAGNOSIS — R7303 Prediabetes: Secondary | ICD-10-CM

## 2017-12-29 DIAGNOSIS — I10 Essential (primary) hypertension: Secondary | ICD-10-CM | POA: Diagnosis not present

## 2017-12-29 LAB — BAYER DCA HB A1C WAIVED: HB A1C: 6.2 % (ref <7.0)

## 2017-12-29 MED ORDER — IBUPROFEN 800 MG PO TABS: 800.0000 mg | ORAL_TABLET | Freq: Three times a day (TID) | ORAL | 3 refills | Status: DC | PRN

## 2017-12-29 NOTE — Progress Notes (Signed)
BP 126/82   Pulse 90   Temp 97.9 F (36.6 C) (Oral)   Ht 5' 5"  (1.651 m)   Wt 199 lb (90.3 kg)   BMI 33.12 kg/m    Subjective:    [name], male    DOB: 05/30/1951, 67 y.o.   MRN: [id number]  HPI: Walter Boone is a 67 y.o. male presenting on 12/29/2017 for Pain (follow up)   HPI Prediabetes Patient comes in today for recheck of his diabetes. Patient has been currently taking diet control and is not on medication, it has been controlled but we will have to see where it goes now that he is on prednisone daily. Patient is not currently on an ACE inhibitor/ARB. Patient has not seen an ophthalmologist this year. Patient denies any issues with their feet.   Hypertension Patient is currently on amlodipine, and their blood pressure today is 126/82. Patient denies any lightheadedness or dizziness. Patient denies headaches, blurred vision, chest pains, shortness of breath, or weakness. Denies any side effects from medication and is content with current medication.   Low back pain from prostate metastasis Patient is coming in for recheck on his low back pain.  He says that he use some of the tramadol but since he has been put on prednisone because of 1 of his chemotherapy agents he has not needed the tramadol and the prednisone is keeping everything at [place].  We will does have to keep a close eye on his prediabetes while he is currently on the prednisone.  He denies any pain today and says he is doing very well.  Relevant past medical, surgical, family and social history reviewed and updated as indicated. Interim medical history since our last visit reviewed. Allergies and medications reviewed and updated.  Review of Systems  Constitutional: Negative for chills and fever.  Respiratory: Negative for shortness of breath and wheezing.   Cardiovascular: Negative for chest pain and leg swelling.  Musculoskeletal: Negative for arthralgias, back pain, gait problem and  joint swelling.  Skin: Negative for rash.  Neurological: Negative for dizziness, weakness, light-headedness and headaches.  All other systems reviewed and are negative.  Per HPI unless specifically indicated above     Objective:    BP 126/82   Pulse 90   Temp 97.9 F (36.6 C) (Oral)   Ht 5' 5"  (1.651 m)   Wt 199 lb (90.3 kg)   BMI 33.12 kg/m   Wt Readings from Last 3 Encounters:  12/29/17 199 lb (90.3 kg)  11/30/17 206 lb (93.4 kg)  11/08/17 203 lb 1.6 oz (92.1 kg)    Physical Exam  Constitutional: He is oriented to person, place, and time. He appears well-developed and well-nourished. No distress.  Eyes: Conjunctivae are normal. No scleral icterus.  Neck: Neck supple. No thyromegaly present.  Cardiovascular: Normal rate, regular rhythm, normal heart sounds and intact distal pulses.  No murmur heard. Pulmonary/Chest: Effort normal and breath sounds normal. No respiratory distress. He has no wheezes. He has no rales.  Musculoskeletal: Normal range of motion. He exhibits no edema.  Lymphadenopathy:    He has no cervical adenopathy.  Neurological: He is alert and oriented to person, place, and time. Coordination normal.  Skin: Skin is warm and dry. No rash noted. He is not diaphoretic.  Psychiatric: He has a normal mood and affect. His behavior is normal.  Nursing note and vitals reviewed.     Assessment & Plan:   Problem List Items Addressed  This Visit      Cardiovascular and Mediastinum   Hypertension   Relevant Orders   CMP14+EGFR (Completed)     Genitourinary   Prostate cancer ([place]) - Primary   Relevant Medications   ibuprofen (ADVIL,MOTRIN) 800 MG tablet     Other   Prediabetes   Relevant Orders   Bayer DCA Hb A1c Waived (Completed)   CMP14+EGFR (Completed)     Follow up plan: Return if symptoms worsen or fail to improve.  Counseling provided for all of the vaccine components Orders Placed This Encounter  Procedures  . Bayer DCA Hb A1c Waived  .  [place], MD [facility] 12/29/2017, 3:49 PM

## 2017-12-30 LAB — CMP14+EGFR
ALBUMIN: 4.5 g/dL (ref 3.6–4.8)
ALT: 14 IU/L (ref 0–44)
AST: 17 IU/L (ref 0–40)
Albumin/Globulin Ratio: 1.7 (ref 1.2–2.2)
Alkaline Phosphatase: 208 IU/L — ABNORMAL HIGH (ref 39–117)
BUN / CREAT RATIO: 23 (ref 10–24)
BUN: 18 mg/dL (ref 8–27)
Bilirubin Total: <0.2 mg/dL (ref 0.0–1.2)
CALCIUM: 8.7 mg/dL (ref 8.6–10.2)
CO2: 23 mmol/L (ref 20–29)
CREATININE: 0.78 mg/dL (ref 0.76–1.27)
Chloride: 101 mmol/L (ref 96–106)
GFR, EST AFRICAN AMERICAN: 110 mL/min/{1.73_m2} (ref >59)
GFR, EST NON AFRICAN AMERICAN: 95 mL/min/{1.73_m2} (ref >59)
GLOBULIN, TOTAL: 2.7 g/dL (ref 1.5–4.5)
Glucose: 104 mg/dL — ABNORMAL HIGH (ref 65–99)
Potassium: 5 mmol/L (ref 3.5–5.2)
SODIUM: 138 mmol/L (ref 134–144)
TOTAL PROTEIN: 7.2 g/dL (ref 6.0–8.5)

## 2018-01-01 ENCOUNTER — Inpatient Hospital Stay (HOSPITAL_COMMUNITY): Payer: Medicare Other

## 2018-01-01 ENCOUNTER — Inpatient Hospital Stay (HOSPITAL_BASED_OUTPATIENT_CLINIC_OR_DEPARTMENT_OTHER): Payer: Medicare Other | Admitting: Oncology

## 2018-01-01 ENCOUNTER — Ambulatory Visit (HOSPITAL_COMMUNITY): Payer: Self-pay

## 2018-01-01 ENCOUNTER — Other Ambulatory Visit: Payer: Self-pay

## 2018-01-01 ENCOUNTER — Encounter (HOSPITAL_COMMUNITY): Payer: Self-pay | Admitting: Oncology

## 2018-01-01 DIAGNOSIS — R350 Frequency of micturition: Secondary | ICD-10-CM | POA: Diagnosis not present

## 2018-01-01 DIAGNOSIS — Z923 Personal history of irradiation: Secondary | ICD-10-CM

## 2018-01-01 DIAGNOSIS — C775 Secondary and unspecified malignant neoplasm of intrapelvic lymph nodes: Secondary | ICD-10-CM

## 2018-01-01 DIAGNOSIS — Z79899 Other long term (current) drug therapy: Secondary | ICD-10-CM | POA: Diagnosis not present

## 2018-01-01 DIAGNOSIS — C7951 Secondary malignant neoplasm of bone: Secondary | ICD-10-CM

## 2018-01-01 DIAGNOSIS — Z79818 Long term (current) use of other agents affecting estrogen receptors and estrogen levels: Secondary | ICD-10-CM | POA: Diagnosis not present

## 2018-01-01 DIAGNOSIS — C61 Malignant neoplasm of prostate: Secondary | ICD-10-CM | POA: Diagnosis not present

## 2018-01-01 LAB — COMPREHENSIVE METABOLIC PANEL
ALT: 17 U/L (ref 17–63)
AST: 27 U/L (ref 15–41)
Albumin: 4.2 g/dL (ref 3.5–5.0)
Alkaline Phosphatase: 264 U/L — ABNORMAL HIGH (ref 38–126)
Anion gap: 13 (ref 5–15)
BUN: 29 mg/dL — ABNORMAL HIGH (ref 6–20)
CHLORIDE: 102 mmol/L (ref 101–111)
CO2: 22 mmol/L (ref 22–32)
CREATININE: 0.85 mg/dL (ref 0.61–1.24)
Calcium: 9.6 mg/dL (ref 8.9–10.3)
GFR calc non Af Amer: >60 mL/min (ref >60)
Glucose, Bld: 130 mg/dL — ABNORMAL HIGH (ref 65–99)
POTASSIUM: 4.2 mmol/L (ref 3.5–5.1)
SODIUM: 137 mmol/L (ref 135–145)
Total Bilirubin: 0.4 mg/dL (ref 0.3–1.2)
Total Protein: 8.1 g/dL (ref 6.5–8.1)

## 2018-01-01 LAB — CBC WITH DIFFERENTIAL/PLATELET
Basophils Absolute: 0 10*3/uL (ref 0.0–0.1)
Basophils Relative: 0 %
EOS ABS: 0.1 10*3/uL (ref 0.0–0.7)
Eosinophils Relative: 1 %
HCT: 38.4 % — ABNORMAL LOW (ref 39.0–52.0)
Hemoglobin: 12.4 g/dL — ABNORMAL LOW (ref 13.0–17.0)
Lymphocytes Relative: 18 %
Lymphs Abs: 2.4 10*3/uL (ref 0.7–4.0)
MCH: 29.4 pg (ref 26.0–34.0)
MCHC: 32.3 g/dL (ref 30.0–36.0)
MCV: 91 fL (ref 78.0–100.0)
MONO ABS: 0.7 10*3/uL (ref 0.1–1.0)
Monocytes Relative: 5 %
NEUTROS PCT: 76 %
Neutro Abs: 9.9 10*3/uL — ABNORMAL HIGH (ref 1.7–7.7)
PLATELETS: 438 10*3/uL — AB (ref 150–400)
RBC: 4.22 MIL/uL (ref 4.22–5.81)
RDW: 12.9 % (ref 11.5–15.5)
WBC: 13.1 10*3/uL — AB (ref 4.0–10.5)

## 2018-01-01 LAB — PSA: Prostatic Specific Antigen: 52.04 ng/mL — ABNORMAL HIGH (ref 0.00–4.00)

## 2018-01-01 NOTE — Progress Notes (Signed)
[place], [place] [address]   CLINIC:  Medical Oncology/Hematology  PCP:  Dettinger, Fransisca Kaufmann, MD [place] [address] [phone]   REASON FOR VISIT:  Follow-up for Stage IIC biochemically recurrent prostate cancer AND osteopenia   CURRENT THERAPY: Casodex 50 mg po daily/[name] inj every 3 months AND Prolia inj every 6 months    BRIEF ONCOLOGIC HISTORY:    Prostate cancer ([place])   06/20/2011 Initial Diagnosis    Prostate cancer ([place])      01/12/2016 Imaging    Bone density- This patient is considered osteopenic by World Healh Organization (WHO) Criteria.       03/16/2016 Imaging    CT CAP- No evidence of metastatic disease within the chest, abdomen, or pelvis. No other acute findings identified.      06/17/2016 Imaging    Bone scan- 1. Negative for metastatic pattern.      07/11/2016 Miscellaneous    Prolia every 6 months for osteopenia.      09/12/2016 -  Chemotherapy    Casodex/Depo-[name] every 3 months       09/27/2016 Imaging    CT CAP -No acute findings and no evidence for metastatic disease within the chest, abdomen or pelvis.      11/13/2017 Progression    Bone Scan: Multiple new abnormal sites of increased osseous tracer accumulation are identified as above consistent with osseous metastatic disease.  These include new foci of uptake at the proximal femora bilaterally.      11/16/2017 Progression    CT CAP-IMPRESSION: 1. New enlarged right pelvic sidewall lymph node and borderline left periaortic lymph node worrisome for metastatic adenopathy. 2. Subtle areas of sclerosis within the lumbar spine are more conspicuous than on the previous exam and worrisome for metastatic disease. Overall the areas of bone metastasis are more better demonstrated on bone scan from 11/13/2017 which demonstrate a progression of bone metastasis.        HISTORY OF PRESENT ILLNESS:  (From Dr. Laverle Patter note on  07/12/17)     INTERVAL HISTORY:  Mr. Walter Boone 67 y.o. male returns for routine follow-up for prostate cancer.   Patient presents today for follow-up and review of recent DEXA scan.  He recently (12/20/17) had his 3 month [name] and monthly Xgeva.  He states he continues to have problems with his urinary stream.  He has recently had an occasional incontinent episodes.  He states his stream is weak even after initiating Flomax.  Continues to have nocturia.  He is scheduled to see his urologist on Friday.  He was recently started on Zytiga and prednisone and is tolerating well.  States pain to neck and left shoulder and extremity have improved since being on the steroid.  Overall he "feels well".  He tells me he recently started drinking a protein shake in the morning which gives him energy.  He also has decreased his sugar intake.  He denies any chest pain, shortness of breath, abdominal pain, nausea, vomiting, diarrhea, weakness or fatigue.  His appetite and energy level are 100%.  REVIEW OF SYSTEMS:  Review of Systems  Constitutional: Negative for chills, diaphoresis, fatigue and fever.  HENT:   Negative for trouble swallowing.   Eyes: Negative.   Respiratory: Negative.  Negative for cough and shortness of breath.   Cardiovascular: Negative.   Gastrointestinal: Negative.  Negative for abdominal pain, blood in stool, constipation, diarrhea, nausea and vomiting.  Endocrine: Negative for hot flashes.  Genitourinary: Positive for difficulty urinating, frequency and nocturia. Negative for bladder incontinence, dysuria, hematuria and pelvic pain.   Musculoskeletal: Negative for arthralgias, back pain, myalgias and neck pain.  Skin: Negative.  Negative for rash.  Neurological: Negative for dizziness and headaches.  Hematological: Does not bruise/bleed easily.  Psychiatric/Behavioral: Positive for sleep disturbance.       Due to nocturia     PAST MEDICAL/SURGICAL HISTORY:  Past Medical History:    Diagnosis Date  . Acid reflux   . Alcoholism ([place])   . Arthritis   . Fatty liver   . Gynecomastia, male 01/11/2013   Secondary to prostate ca Tx.   . H/O Bell's palsy   . Hemorrhoids    history  . High cholesterol   . History of back injury 11/19/2013  . Hyperglycemia   . Hyperlipidemia   . Hypertension   . Prostate cancer ([place])   . Prostate cancer ([place]) 06/20/2011  . Prostate carcinoma, recurrent ([place]) 08/10/2012   Past Surgical History:  Procedure Laterality Date  . [place]   left  . COLONOSCOPY  2008  . ESOPHAGOGASTRODUODENOSCOPY    . HERNIA REPAIR  2010  . I&D EXTREMITY Left 06/10/2015   Procedure: IRRIGATION AND DEBRIDEMENT EXTREMITY LEFT HAND EXPLORATION, nerve repair;  Surgeon: Charlotte Crumb, MD;  Location: [facility];  Service: Orthopedics;  Laterality: Left;  . KNEE SURGERY     left  . PROSTATE BIOPSY  11/06     SOCIAL HISTORY:  Social History   Socioeconomic History  . Marital status: Married    Spouse name: Not on file  . Number of children: Not on file  . Years of education: Not on file  . Highest education level: Not on file  Social Needs  . Financial resource strain: Not on file  . Food insecurity - worry: Not on file  . Food insecurity - inability: Not on file  . Transportation needs - medical: Not on file  . Transportation needs - non-medical: Not on file  Occupational History  . Not on file  Tobacco Use  . Smoking status: Former Smoker    Packs/day: 2.50    Years: 3.00    Pack years: 7.50  . Smokeless tobacco: Never Used  Substance and Sexual Activity  . Alcohol use: No    Comment: former drinker 20 years ago  . Drug use: No  . Sexual activity: Not on file  Other Topics Concern  . Not on file  Social History Narrative  . Not on file    FAMILY HISTORY:  Family History  Problem Relation Age of Onset  . Heart failure Mother   . Diabetes Father   . Cancer Sister   . Diabetes Brother     CURRENT MEDICATIONS:  Outpatient  Encounter Medications as of 01/01/2018  Medication Sig Note  . abiraterone acetate (ZYTIGA) 250 MG tablet Take 4 tablets (1,000 mg total) by mouth daily. Take on an empty stomach 1 hour before or 2 hours after a meal   . amLODipine (NORVASC) 10 MG tablet Take 1 tablet (10 mg total) by mouth daily.   . Calcium-Magnesium-Vitamin D (CALCIUM 1200+D3 PO) Take 1 tablet by mouth daily.   . Cholecalciferol (VITAMIN D-3) 1000 units CAPS Take 2 capsules (2,000 Units total) by mouth daily.   . Denosumab (XGEVA [place]) Inject into the skin every 30 (thirty) days.    Marland Kitchen ibuprofen (ADVIL,MOTRIN) 800 MG tablet Take 1 tablet (800 mg total) by mouth every 8 (  eight) hours as needed. 01/01/2018: Only takes once in a while  . Leuprolide Acetate ([name] IJ) Inject as directed every 3 (three) months. 10/30/2015: DUE FOR INJECTION 11/2015   . predniSONE (DELTASONE) 5 MG tablet Take 1 tablet (5 mg total) by mouth 2 (two) times daily with a meal.   . tamsulosin (FLOMAX) 0.4 MG CAPS capsule Take 1 capsule (0.4 mg total) by mouth daily.   . traMADol (ULTRAM) 50 MG tablet Take 1 tablet (50 mg total) by mouth every 6 (six) hours as needed. 01/01/2018: Takes once in a while   No facility-administered encounter medications on file as of 01/01/2018.     ALLERGIES:  Allergies  Allergen Reactions  . Tylenol [Acetaminophen]     Told not to take this because of problems with the cancer medications.  . Tramadol Rash    Pt said it gave him blisters     PHYSICAL EXAM:  ECOG Performance status: 1-2 - Symptomatic; may require occasional assistance  There were no vitals filed for this visit. There were no vitals filed for this visit.  Physical Exam  Constitutional: He is oriented to person, place, and time and well-developed, well-nourished, and in no distress.  HENT:  Head: Normocephalic.  Mouth/Throat: Oropharynx is clear and moist. No oropharyngeal exudate.  Eyes: Pupils are equal, round, and reactive to light. No scleral  icterus.  Neck: Normal range of motion. Neck supple.  Cardiovascular: Normal rate and regular rhythm.  Pulmonary/Chest: Effort normal and breath sounds normal. No respiratory distress. He has no wheezes.  Abdominal: Soft. Bowel sounds are normal. He exhibits no distension. There is no tenderness.  Musculoskeletal: Normal range of motion. He exhibits no edema.  Lymphadenopathy:    He has no cervical adenopathy.       Right: No supraclavicular adenopathy present.       Left: No supraclavicular adenopathy present.  Neurological: He is alert and oriented to person, place, and time. No cranial nerve deficit.  Skin: Skin is warm and dry. No rash noted.  Psychiatric: Mood and affect normal.  Nursing note and vitals reviewed.    LABORATORY DATA:  I have reviewed the labs as listed.  CBC    Component Value Date/Time   WBC 13.1 (H) 01/01/2018 1333   RBC 4.22 01/01/2018 1333   HGB 12.4 (L) 01/01/2018 1333   HGB 12.4 (L) 05/19/2016 1623   HCT 38.4 (L) 01/01/2018 1333   HCT 36.1 (L) 05/19/2016 1623   PLT 438 (H) 01/01/2018 1333   PLT 369 05/19/2016 1623   MCV 91.0 01/01/2018 1333   MCV 90 05/19/2016 1623   MCH 29.4 01/01/2018 1333   MCHC 32.3 01/01/2018 1333   RDW 12.9 01/01/2018 1333   RDW 13.3 05/19/2016 1623   LYMPHSABS 2.4 01/01/2018 1333   LYMPHSABS 2.3 05/19/2016 1623   MONOABS 0.7 01/01/2018 1333   EOSABS 0.1 01/01/2018 1333   EOSABS 0.3 05/19/2016 1623   BASOSABS 0.0 01/01/2018 1333   BASOSABS 0.0 05/19/2016 1623   CMP Latest Ref Rng & Units 01/01/2018 12/29/2017 12/20/2017  Glucose 65 - 99 mg/dL 130(H) 104(H) 108(H)  BUN 6 - 20 mg/dL 29(H) 18 19  Creatinine 0.61 - 1.24 mg/dL 0.85 0.78 0.85  Sodium 135 - 145 mmol/L 137 138 138  Potassium 3.5 - 5.1 mmol/L 4.2 5.0 4.3  Chloride 101 - 111 mmol/L 102 101 104  CO2 22 - 32 mmol/L 22 23 24   Calcium 8.9 - 10.3 mg/dL 9.6 8.7 8.9  Total Protein 6.5 -  8.1 g/dL 8.1 7.2 7.5  Total Bilirubin 0.3 - 1.2 mg/dL 0.4 <0.2 0.4  Alkaline Phos  38 - 126 U/L 264(H) 208(H) 174(H)  AST 15 - 41 U/L 27 17 20   ALT 17 - 63 U/L 17 14 16(L)       PENDING LABS:    DIAGNOSTIC IMAGING:  *The following radiologic images and reports have been reviewed independently and agree with below findings.  CT chest/abd/pelvis: 09/27/16 CLINICAL DATA:  Followup prostate cancer  EXAM: CT CHEST, ABDOMEN, AND PELVIS WITH CONTRAST  TECHNIQUE: Multidetector CT imaging of the chest, abdomen and pelvis was performed following the standard protocol during bolus administration of intravenous contrast.  CONTRAST:  180mL ISOVUE-300 IOPAMIDOL (ISOVUE-300) INJECTION 61%  COMPARISON:  03/16/2016  FINDINGS: CT CHEST FINDINGS  Cardiovascular: No significant vascular findings. Normal heart size. No pericardial effusion.  Mediastinum/Nodes: No enlarged mediastinal, hilar, or axillary lymph nodes. Thyroid gland, trachea, and esophagus demonstrate no significant findings.  Lungs/Pleura: Lungs are clear. No pleural effusion or pneumothorax.  Musculoskeletal: No chest wall mass or suspicious bone lesions identified.  CT ABDOMEN PELVIS FINDINGS  Hepatobiliary: No focal liver abnormality is seen. No gallstones, gallbladder wall thickening, or biliary dilatation.  Pancreas: Unremarkable. No pancreatic ductal dilatation or surrounding inflammatory changes.  Spleen: Normal in size without focal abnormality.  Adrenals/Urinary Tract: Normal adrenal glands. Small cyst arising from the posterior left kidney measures 1.5 cm and is unchanged from previous exam. No mass or hydronephrosis. The urinary bladder appears normal. Patent ureters.  Stomach/Bowel: Stomach is within normal limits. Appendix appears normal. No evidence of bowel wall thickening, distention, or inflammatory changes.  Vascular/Lymphatic: Calcified atherosclerotic disease involves the abdominal aorta. No aneurysm. No enlarged retroperitoneal or mesenteric adenopathy.  No enlarged pelvic or inguinal lymph nodes.  Reproductive: Fiducial markers noted within the prostate bed. No mass identified.  Other: There is no ascites or focal fluid collections within the abdomen or pelvis.  Musculoskeletal: No aggressive lytic or sclerotic bone lesions. Degenerative disc disease noted within the lumbar spine.  IMPRESSION: 1. No acute findings and no evidence for metastatic disease within the chest, abdomen or pelvis.   Electronically Signed   By: Kerby Moors M.D.   On: 09/27/2016 16:22   Bone scan:  CLINICAL DATA:  Prostate cancer with elevated PSA.  EXAM: NUCLEAR MEDICINE WHOLE BODY BONE SCAN  TECHNIQUE: Whole body anterior and posterior images were obtained approximately 3 hours after intravenous injection of radiopharmaceutical.  RADIOPHARMACEUTICALS:  23.8 mCi Technetium-25m MDP IV  COMPARISON:  02/11/2015  FINDINGS: Normal distribution of tracer with visible kidneys and bladder. Negative for metastatic pattern. Increased activity around chronic bilateral L5 pars defects. Subtle increased activity left ischial tuberosity has no correlate on recent abdomen pelvis CT. Mildly heterogeneous calvarial activity is stable since 2011. Degenerative activity to the shoulders, hands, knees, and feet.  IMPRESSION: 1. Negative for metastatic pattern. 2. Increased degenerative activity at chronic L5 pars defects.   Electronically Signed   By: Monte Fantasia M.D.   On: 06/17/2016 12:50   DEXA scan: 12/20/17  ASSESSMENT: BMD as determined from Femur Neck Left is 0.925 g/cm2 with a T-score of -1.1.This patient is considered osteopenic by World Healh Organization (WHO) Criteria.Compared with the prior study on 01/12/2016 , the BMD of the total mean shows a statistically significant increase. (L-4 was excluded due to advanced degenerative changes.) World Health Organization [facility]) criteria for post-menopausal, Caucasian  Women: Normal:       T-score at or above -1 SD  Osteopenia:   T-score between -1 and -2.5 SD Osteoporosis: T-score at or below -2.5 SD  PATHOLOGY:   ASSESSMENT & PLAN:   Stage IIC biochemically recurrent prostate cancer, now with metastatic disease to his bone and pelvic lymph node:  -Found to have biochemical recurrence in 2012 and was started on [name] at that time. Casodex was added in 2015. His PSA began to rise and Casodex was dicontinued in 05/2016 to try to drive PSA back down. However, PSA continued to climb at that time. Therefore, Casodex was resumed in 09/2016 to try to re-challenge response.  Testosterone levels have been in castrate range.  He has continued on [name] injections every 3 months.     -PSA trends were declining and have begun to rise again.  PSA as follows: 01/01/2018: 52.04 12/20/2017: 29.84 11/22/2017: 17.45 11/08/2017: 10.64  Patient was started on Zytiga and prednisone approximately 10 days ago. He stopped Casodex at this time.  Most recent CT imaging due to PSA doubling time was on 11/16/2017 indicating new enlarged right pelvic sidewall lymph node and borderline left periaortic lymph node which were worrisome for metastatic adenopathy.  Bone scan from 11/13/2017 demonstrated progression of bone metastases.   -He was started on Zytiga and prednisone daily and side effects were reviewed.  He will receive financial assistance for the Zytiga. -He is tolerating  Zytiga and prednisone well.  He notes less pain in his neck, left shoulder and small joints. -Most recent testosterone level shows that patient is at castrate levels.  -Continue [name] every 3 months.  Urinary frequency/Incomplete bladder emptying: -Due to radiation therapy. -Continue Flomax as prescribed.  Has urology appointment on Friday.  Financial concerns: -Lendell Caprice our patient advocate to follow patient for financial concerns and additional resources available.  Bone health:  -Dexa scan  12/20/16 showed T score of -1.1.  -Continue monthly Xgeva.  Dispo:  -RTC in 3 months for follow up with labs.  Orders placed this encounter:  No orders of the defined types were placed in this encounter.   All questions were answered to patient's stated satisfaction. Encouraged patient to call with any new concerns or questions before his next visit to the cancer center and we can certain see him sooner, if needed.    Faythe Casa, NP 01/02/2018 12:48 PM

## 2018-01-01 NOTE — Patient Instructions (Signed)
[facility] at [facility] Discharge Instructions  RECOMMENDATIONS MADE BY THE CONSULTANT AND ANY TEST RESULTS WILL BE SENT TO YOUR REFERRING PHYSICIAN.    Thank you for choosing [facility] at [facility] to provide your oncology and hematology care.  To afford each patient quality time with our provider, please arrive at least 15 minutes before your scheduled appointment time.    If you have a lab appointment with the Cancer Center please come in thru the  Main Entrance and check in at the main information desk  You need to re-schedule your appointment should you arrive 10 or more minutes late.  We strive to give you quality time with our providers, and arriving late affects you and other patients whose appointments are after yours.  Also, if you no show three or more times for appointments you may be dismissed from the clinic at the providers discretion.     Again, thank you for choosing [facility].  Our hope is that these requests will decrease the amount of time that you wait before being seen by our physicians.       _____________________________________________________________  Should you have questions after your visit to [facility], please contact our office at [phone] between the hours of 8:30 a.m. and 4:30 p.m.  Voicemails left after 4:30 p.m. will not be returned until the following business day.  For prescription refill requests, have your pharmacy contact our office.       Resources For Cancer Patients and their Caregivers ? American Cancer Society: Can assist with transportation, wigs, general needs, runs Look Good Feel Better.        1-888-227-6333 ? Cancer Care: Provides financial assistance, online support groups, medication/co-pay assistance.  1-800-813-HOPE (4673) ? [facility] Assists [place] cancer patients and their families through emotional , educational  and financial support.  336-427-4357 ? [place] DSS Where to apply for food stamps, Medicaid and utility assistance. 336-342-1394 ? [facility]: Transportation to medical appointments. 336-347-2287 ? Social Security Administration: May apply for disability if have a Stage IV cancer. 336-342-7796 1-800-772-1213 ? [place] Aging, Disability and Transit Services: Assists with nutrition, care and transit needs. 336-349-2343  Cancer Center Support Programs: @10RELATIVEDAYS@ > Cancer Support Group  2nd Tuesday of the month 1pm-2pm, Journey Room  > Creative Journey  3rd Tuesday of the month 1130am-1pm, Journey Room  > Look Good Feel Better  1st Wednesday of the month 10am-12 noon, Journey Room (Call American Cancer Society to register 1-800-395-5775)   

## 2018-01-01 NOTE — Progress Notes (Signed)
Patient taking Zytiga and Prednisone as prescribed and has not missed any doses. Patient does not report any side effects related to treatment.

## 2018-01-02 ENCOUNTER — Ambulatory Visit (INDEPENDENT_AMBULATORY_CARE_PROVIDER_SITE_OTHER): Payer: Medicare Other | Admitting: Urology

## 2018-01-02 DIAGNOSIS — R9721 Rising PSA following treatment for malignant neoplasm of prostate: Secondary | ICD-10-CM

## 2018-01-02 DIAGNOSIS — Z79899 Other long term (current) drug therapy: Secondary | ICD-10-CM | POA: Diagnosis not present

## 2018-01-02 DIAGNOSIS — C61 Malignant neoplasm of prostate: Secondary | ICD-10-CM

## 2018-01-02 DIAGNOSIS — R3912 Poor urinary stream: Secondary | ICD-10-CM

## 2018-01-02 DIAGNOSIS — R350 Frequency of micturition: Secondary | ICD-10-CM | POA: Diagnosis not present

## 2018-01-02 LAB — TESTOSTERONE

## 2018-01-03 ENCOUNTER — Telehealth (HOSPITAL_COMMUNITY): Payer: Self-pay | Admitting: *Deleted

## 2018-01-03 ENCOUNTER — Other Ambulatory Visit (HOSPITAL_COMMUNITY): Payer: Self-pay | Admitting: *Deleted

## 2018-01-03 MED ORDER — TAMSULOSIN HCL 0.4 MG PO CAPS: 0.4000 mg | ORAL_CAPSULE | Freq: Two times a day (BID) | ORAL | 2 refills | Status: DC

## 2018-01-03 NOTE — Telephone Encounter (Signed)
-----   Message from Jacquelin Hawking, NP sent at 01/02/2018  1:09 PM EST ----- 1 be able to call this patient and let him know his PSA results?  They have increased again but we will not see a significant decline until he has been on the Zytiga/prednisone for at least a month.  Otherwise all of his labs look good.  Thanks Sonia Baller

## 2018-01-03 NOTE — Telephone Encounter (Signed)
Left detailed message on patient's voicemail. Advised to return call to clinic with any questions.

## 2018-01-09 ENCOUNTER — Other Ambulatory Visit (HOSPITAL_COMMUNITY): Payer: Self-pay | Admitting: *Deleted

## 2018-01-09 DIAGNOSIS — C61 Malignant neoplasm of prostate: Secondary | ICD-10-CM

## 2018-01-09 MED ORDER — ABIRATERONE ACETATE 250 MG PO TABS: 1000.0000 mg | ORAL_TABLET | Freq: Every day | ORAL | 0 refills | Status: DC

## 2018-01-12 ENCOUNTER — Ambulatory Visit (HOSPITAL_COMMUNITY): Payer: Self-pay

## 2018-01-12 ENCOUNTER — Other Ambulatory Visit (HOSPITAL_COMMUNITY): Payer: Self-pay

## 2018-01-15 ENCOUNTER — Other Ambulatory Visit: Payer: Self-pay | Admitting: Family Medicine

## 2018-01-17 ENCOUNTER — Inpatient Hospital Stay (HOSPITAL_COMMUNITY): Payer: Medicare HMO

## 2018-01-17 ENCOUNTER — Inpatient Hospital Stay (HOSPITAL_COMMUNITY): Payer: Medicare HMO | Attending: Oncology

## 2018-01-17 DIAGNOSIS — C61 Malignant neoplasm of prostate: Secondary | ICD-10-CM

## 2018-01-17 DIAGNOSIS — C775 Secondary and unspecified malignant neoplasm of intrapelvic lymph nodes: Secondary | ICD-10-CM | POA: Diagnosis not present

## 2018-01-17 DIAGNOSIS — C7951 Secondary malignant neoplasm of bone: Secondary | ICD-10-CM | POA: Insufficient documentation

## 2018-01-17 LAB — COMPREHENSIVE METABOLIC PANEL
ALBUMIN: 3.8 g/dL (ref 3.5–5.0)
ALT: 16 U/L — ABNORMAL LOW (ref 17–63)
ANION GAP: 10 (ref 5–15)
AST: 20 U/L (ref 15–41)
Alkaline Phosphatase: 285 U/L — ABNORMAL HIGH (ref 38–126)
BILIRUBIN TOTAL: 0.4 mg/dL (ref 0.3–1.2)
BUN: 29 mg/dL — AB (ref 6–20)
CHLORIDE: 105 mmol/L (ref 101–111)
CO2: 22 mmol/L (ref 22–32)
Calcium: 8.3 mg/dL — ABNORMAL LOW (ref 8.9–10.3)
Creatinine, Ser: 0.84 mg/dL (ref 0.61–1.24)
GFR calc Af Amer: >60 mL/min (ref >60)
GFR calc non Af Amer: >60 mL/min (ref >60)
GLUCOSE: 125 mg/dL — AB (ref 65–99)
POTASSIUM: 4.4 mmol/L (ref 3.5–5.1)
SODIUM: 137 mmol/L (ref 135–145)
TOTAL PROTEIN: 7.2 g/dL (ref 6.5–8.1)

## 2018-01-17 LAB — CBC WITH DIFFERENTIAL/PLATELET
BASOS ABS: 0 10*3/uL (ref 0.0–0.1)
BASOS PCT: 0 %
EOS ABS: 0.2 10*3/uL (ref 0.0–0.7)
Eosinophils Relative: 2 %
HEMATOCRIT: 36.8 % — AB (ref 39.0–52.0)
Hemoglobin: 11.8 g/dL — ABNORMAL LOW (ref 13.0–17.0)
Lymphocytes Relative: 25 %
Lymphs Abs: 2.3 10*3/uL (ref 0.7–4.0)
MCH: 29.3 pg (ref 26.0–34.0)
MCHC: 32.1 g/dL (ref 30.0–36.0)
MCV: 91.3 fL (ref 78.0–100.0)
MONO ABS: 0.6 10*3/uL (ref 0.1–1.0)
MONOS PCT: 6 %
NEUTROS ABS: 6.3 10*3/uL (ref 1.7–7.7)
NEUTROS PCT: 67 %
Platelets: 304 10*3/uL (ref 150–400)
RBC: 4.03 MIL/uL — ABNORMAL LOW (ref 4.22–5.81)
RDW: 13.9 % (ref 11.5–15.5)
WBC: 9.3 10*3/uL (ref 4.0–10.5)

## 2018-01-17 LAB — PSA: Prostatic Specific Antigen: 67.49 ng/mL — ABNORMAL HIGH (ref 0.00–4.00)

## 2018-01-17 NOTE — Progress Notes (Signed)
Patient is taking zytiga and has not missed any doses and reports no side effects at this time.   

## 2018-01-17 NOTE — Progress Notes (Signed)
No xgeva today per pharmacist Milbert Coulter. Gave information sheet on xgeva to make sure he is taking the correct amount of calcium and vit. D.   Follow up as scheduled.

## 2018-01-28 ENCOUNTER — Other Ambulatory Visit: Payer: Self-pay

## 2018-01-28 ENCOUNTER — Emergency Department (HOSPITAL_COMMUNITY): Payer: Medicare HMO

## 2018-01-28 ENCOUNTER — Encounter (HOSPITAL_COMMUNITY): Payer: Self-pay | Admitting: Emergency Medicine

## 2018-01-28 ENCOUNTER — Emergency Department (HOSPITAL_COMMUNITY)
Admission: EM | Admit: 2018-01-28 | Discharge: 2018-01-29 | Disposition: A | Discharge status: Home / Self Care | Payer: Medicare HMO | Attending: Emergency Medicine | Admitting: Emergency Medicine

## 2018-01-28 DIAGNOSIS — Z8546 Personal history of malignant neoplasm of prostate: Secondary | ICD-10-CM | POA: Diagnosis not present

## 2018-01-28 DIAGNOSIS — C419 Malignant neoplasm of bone and articular cartilage, unspecified: Secondary | ICD-10-CM | POA: Diagnosis not present

## 2018-01-28 DIAGNOSIS — C7951 Secondary malignant neoplasm of bone: Secondary | ICD-10-CM | POA: Diagnosis not present

## 2018-01-28 DIAGNOSIS — R079 Chest pain, unspecified: Secondary | ICD-10-CM | POA: Insufficient documentation

## 2018-01-28 DIAGNOSIS — I10 Essential (primary) hypertension: Secondary | ICD-10-CM | POA: Insufficient documentation

## 2018-01-28 DIAGNOSIS — R0602 Shortness of breath: Secondary | ICD-10-CM | POA: Diagnosis not present

## 2018-01-28 DIAGNOSIS — Z87891 Personal history of nicotine dependence: Secondary | ICD-10-CM | POA: Insufficient documentation

## 2018-01-28 DIAGNOSIS — Z79899 Other long term (current) drug therapy: Secondary | ICD-10-CM | POA: Insufficient documentation

## 2018-01-28 DIAGNOSIS — K5730 Diverticulosis of large intestine without perforation or abscess without bleeding: Secondary | ICD-10-CM | POA: Diagnosis not present

## 2018-01-28 LAB — CBC
HCT: 35.3 % — ABNORMAL LOW (ref 39.0–52.0)
Hemoglobin: 11.4 g/dL — ABNORMAL LOW (ref 13.0–17.0)
MCH: 29.3 pg (ref 26.0–34.0)
MCHC: 32.3 g/dL (ref 30.0–36.0)
MCV: 90.7 fL (ref 78.0–100.0)
Platelets: 296 10*3/uL (ref 150–400)
RBC: 3.89 MIL/uL — ABNORMAL LOW (ref 4.22–5.81)
RDW: 14.1 % (ref 11.5–15.5)
WBC: 10.7 10*3/uL — ABNORMAL HIGH (ref 4.0–10.5)

## 2018-01-28 LAB — BASIC METABOLIC PANEL
Anion gap: 8 (ref 5–15)
BUN: 21 mg/dL — ABNORMAL HIGH (ref 6–20)
CO2: 24 mmol/L (ref 22–32)
Calcium: 9.7 mg/dL (ref 8.9–10.3)
Chloride: 108 mmol/L (ref 101–111)
Creatinine, Ser: 0.77 mg/dL (ref 0.61–1.24)
GFR calc Af Amer: >60 mL/min (ref >60)
GFR calc non Af Amer: >60 mL/min (ref >60)
Glucose, Bld: 148 mg/dL — ABNORMAL HIGH (ref 65–99)
Potassium: 3.6 mmol/L (ref 3.5–5.1)
Sodium: 140 mmol/L (ref 135–145)

## 2018-01-28 LAB — TROPONIN I
Troponin I: <0.03 ng/mL (ref <0.03)
Troponin I: <0.03 ng/mL (ref <0.03)

## 2018-01-28 MED ORDER — OXYCODONE-ACETAMINOPHEN 5-325 MG PO TABS: 1.0000 | ORAL_TABLET | ORAL | 0 refills | Status: DC | PRN

## 2018-01-28 MED ORDER — IOPAMIDOL (ISOVUE-370) INJECTION 76%
100.0000 mL | Freq: Once | INTRAVENOUS | Status: AC | PRN
  Administered 2018-01-28: 100 mL via INTRAVENOUS

## 2018-01-28 MED ORDER — HYDROMORPHONE HCL 1 MG/ML IJ SOLN
0.5000 mg | Freq: Once | INTRAMUSCULAR | Status: AC
  Administered 2018-01-28: 0.5 mg via INTRAVENOUS
  Filled 2018-01-28: qty 1

## 2018-01-28 MED ORDER — SODIUM CHLORIDE 0.9 % IV BOLUS (SEPSIS)
1000.0000 mL | Freq: Once | INTRAVENOUS | Status: AC
  Administered 2018-01-28: 1000 mL via INTRAVENOUS

## 2018-01-28 NOTE — ED Triage Notes (Signed)
Pt reports generalized CP that radiates into neck, RT shoulder, and down RT arm. Pt also c/o nausea.

## 2018-01-30 ENCOUNTER — Other Ambulatory Visit (HOSPITAL_COMMUNITY): Payer: Self-pay | Admitting: Emergency Medicine

## 2018-01-30 DIAGNOSIS — C61 Malignant neoplasm of prostate: Secondary | ICD-10-CM

## 2018-01-30 MED ORDER — ABIRATERONE ACETATE 250 MG PO TABS: 1000.0000 mg | ORAL_TABLET | Freq: Every day | ORAL | 0 refills | Status: DC

## 2018-01-30 NOTE — Progress Notes (Signed)
zytiga refilled 

## 2018-01-30 NOTE — ED Provider Notes (Signed)
[facility] EMERGENCY DEPARTMENT Provider Note   CSN: [id number] Arrival date & time: 01/28/18  1601     History   Chief Complaint Chief Complaint  Patient presents with  . Chest Pain    HPI Walter Boone is a 67 y.o. male.  HPI   39yM with CP. Radiates into neck and R shoulder. Sometimes down into R arm. Worse with certain movements. Worsens over past several days. Nonexertional. No cough. No fever or chills. No n/v.   Past Medical History:  Diagnosis Date  . Acid reflux   . Alcoholism ([place])   . Arthritis   . Fatty liver   . Gynecomastia, male 01/11/2013   Secondary to prostate ca Tx.   . H/O Bell's palsy   . Hemorrhoids    history  . High cholesterol   . History of back injury 11/19/2013  . Hyperglycemia   . Hyperlipidemia   . Hypertension   . Prostate cancer ([place])   . Prostate cancer ([place]) 06/20/2011  . Prostate carcinoma, recurrent ([place]) 08/10/2012    Patient Active Problem List   Diagnosis Date Noted  . Chronic right-sided low back pain with right-sided sciatica 07/06/2017  . GERD (gastroesophageal reflux disease) 11/22/2016  . Urinary frequency 09/13/2016  . Knee pain, left 02/11/2016  . Prediabetes 01/27/2016  . Osteopenia determined by x-ray 02/01/2015  . Hypertension   . Hyperlipidemia   . Gynecomastia, male 01/11/2013  . Prostate cancer ([place]) 06/20/2011  . H N P-CERVICAL 10/05/2009  . CERVICAL SPASM 10/05/2009    Past Surgical History:  Procedure Laterality Date  . [place]   left  . COLONOSCOPY  2008  . ESOPHAGOGASTRODUODENOSCOPY    . HERNIA REPAIR  2010  . I&D EXTREMITY Left 06/10/2015   Procedure: IRRIGATION AND DEBRIDEMENT EXTREMITY LEFT HAND EXPLORATION, nerve repair;  Surgeon: Charlotte Crumb, MD;  Location: [place];  Service: Orthopedics;  Laterality: Left;  . KNEE SURGERY     left  . PROSTATE BIOPSY  11/06       Home Medications    Prior to Admission medications   Medication Sig Start Date End Date Taking?  Authorizing Provider  amLODipine (NORVASC) 10 MG tablet TAKE 1 TABLET DAILY 01/15/18  Yes Dettinger, Fransisca Kaufmann, MD  Calcium-Magnesium-Vitamin D (CALCIUM 1200+D3 PO) Take 3 tablets by mouth daily.    Yes [provider]  ibuprofen (ADVIL,MOTRIN) 800 MG tablet Take 1 tablet (800 mg total) by mouth every 8 (eight) hours as needed. Patient taking differently: Take 800 mg by mouth daily as needed.  12/29/17  Yes Dettinger, Fransisca Kaufmann, MD  Leuprolide Acetate ([name] IJ) Inject as directed every 3 (three) months.   Yes [provider]  predniSONE (DELTASONE) 5 MG tablet Take 1 tablet (5 mg total) by mouth 2 (two) times daily with a meal. 11/17/17  Yes Twana First, MD  tamsulosin (FLOMAX) 0.4 MG CAPS capsule Take 1 capsule (0.4 mg total) by mouth 2 (two) times daily. 01/03/18  Yes Holley Bouche, NP  traMADol (ULTRAM) 50 MG tablet Take 1 tablet (50 mg total) by mouth every 6 (six) hours as needed. 11/30/17  Yes Dettinger, Fransisca Kaufmann, MD  abiraterone acetate (ZYTIGA) 250 MG tablet Take 4 tablets (1,000 mg total) by mouth daily. Take on an empty stomach 1 hour before or 2 hours after a meal 01/30/18   Jacquelin Hawking, NP  Denosumab (XGEVA ) Inject into the skin every 30 (thirty) days.     [provider]  oxyCODONE-acetaminophen (PERCOCET/ROXICET) 5-325 MG tablet Take 1-2 tablets by mouth every 4 (four) hours as needed. 01/28/18   Virgel Manifold, MD    Family History Family History  Problem Relation Age of Onset  . Heart failure Mother   . Diabetes Father   . Cancer Sister   . Diabetes Brother     Social History Social History   Tobacco Use  . Smoking status: Former Smoker    Packs/day: 2.50    Years: 3.00    Pack years: 7.50  . Smokeless tobacco: Never Used  Substance Use Topics  . Alcohol use: No    Comment: former drinker 20 years ago  . Drug use: No     Allergies   Patient has no known allergies.   Review of Systems Review of Systems  All systems  reviewed and negative, other than as noted in HPI.  Physical Exam Updated Vital Signs BP 104/65   Pulse 79   Temp 98 F (36.7 C) (Oral)   Resp (!) 22   Ht 5\' 6"  (1.676 m)   Wt 85.7 kg (189 lb)   SpO2 100%   BMI 30.51 kg/m   Physical Exam  Constitutional: He appears well-developed and well-nourished. No distress.  HENT:  Head: Normocephalic and atraumatic.  Eyes: Conjunctivae are normal. Right eye exhibits no discharge. Left eye exhibits no discharge.  Neck: Neck supple.  Cardiovascular: Normal rate, regular rhythm and normal heart sounds. Exam reveals no gallop and no friction rub.  No murmur heard. Pulmonary/Chest: Effort normal and breath sounds normal. No respiratory distress.  Abdominal: Soft. He exhibits no distension. There is no tenderness.  Musculoskeletal: He exhibits no edema or tenderness.  Lower extremities symmetric as compared to each other. No calf tenderness. Negative Homan's. No palpable cords.   Neurological: He is alert.  Skin: Skin is warm and dry.  Psychiatric: He has a normal mood and affect. His behavior is normal. Thought content normal.  Nursing note and vitals reviewed.    ED Treatments / Results  Labs (all labs ordered are listed, but only abnormal results are displayed) Labs Reviewed  BASIC METABOLIC PANEL - Abnormal; Notable for the following components:      Result Value   Glucose, Bld 148 (*)    BUN 21 (*)    All other components within normal limits  CBC - Abnormal; Notable for the following components:   WBC 10.7 (*)    RBC 3.89 (*)    Hemoglobin 11.4 (*)    HCT 35.3 (*)    All other components within normal limits  TROPONIN I  TROPONIN I    EKG  EKG Interpretation  Date/Time:  Sunday January 28 2018 16:21:54 EST Ventricular Rate:  97 PR Interval:  150 QRS Duration: 86 QT Interval:  358 QTC Calculation: 454 R Axis:   47 Text Interpretation:  Normal sinus rhythm Normal ECG Since last tracing QT has shortened Confirmed by  Daleen Bo [phone]) on 01/28/2018 6:53:50 PM       Radiology Ct Angio Chest/abd/pel For Dissection W And/or Wo Contrast  Result Date: 01/28/2018 CLINICAL DATA:  Generalized chest pain radiating into neck, shoulder and down right arm. Nausea, shortness of breath and weakness for 1 day which is intermittent and severe today. History of prostate cancer 13 years ago. Patient states that prostate cancer is now metastatic to the pelvic bones, neck and right shoulder. EXAM: CT ANGIOGRAPHY CHEST, ABDOMEN AND PELVIS TECHNIQUE: Multidetector CT imaging through the chest,  abdomen and pelvis was performed using the standard protocol during bolus administration of intravenous contrast. Multiplanar reconstructed images and MIPs were obtained and reviewed to evaluate the vascular anatomy. CONTRAST:  188mL ISOVUE-370 IOPAMIDOL (ISOVUE-370) INJECTION 76% COMPARISON:  CT chest, abdomen and pelvis dated 11/16/2017. FINDINGS: CTA CHEST FINDINGS Cardiovascular: Thoracic aorta is normal in caliber and configuration. No aneurysm or dissection. Heart size is within normal limits. No pericardial effusion. No pulmonary embolism identified. Mediastinum/Nodes: No mass or enlarged lymph nodes within the mediastinum or perihilar regions. Esophagus appears normal. Trachea and central bronchi are unremarkable. Lungs/Pleura: Mild scarring/atelectasis at the left lung base. Lungs otherwise clear. No pulmonary nodule or mass seen. No pleural effusion or pneumothorax. Musculoskeletal: Patchy sclerotic changes throughout the thoracic spine, consistent with metastatic disease. Similar sclerotic changes within nearly all ribs bilaterally indicating metastatic disease. Right scapular metastasis better demonstrated on earlier bone scan. Subtle metastasis noted within the lower sternum. No osseous fracture or dislocation appreciated. Probable bilateral gynecomastia. Superficial soft tissues otherwise unremarkable. Review of the MIP images confirms  the above findings. CTA ABDOMEN AND PELVIS FINDINGS VASCULAR Aorta: Abdominal aorta is normal in caliber and configuration. No aneurysm or dissection. Celiac: Patent without evidence of aneurysm, dissection, vasculitis or significant stenosis. SMA: Patent without evidence of aneurysm, dissection, vasculitis or significant stenosis. Renals: Both renal arteries are patent without evidence of aneurysm, dissection, vasculitis, fibromuscular dysplasia or significant stenosis. IMA: Patent without evidence of aneurysm, dissection, vasculitis or significant stenosis. Inflow: Patent without evidence of aneurysm, dissection, vasculitis or significant stenosis. Veins: No obvious venous abnormality within the limitations of this arterial phase study. Review of the MIP images confirms the above findings. NON-VASCULAR Hepatobiliary: No focal liver abnormality is seen. No gallstones, gallbladder wall thickening, or biliary dilatation. Pancreas: Unremarkable. No pancreatic ductal dilatation or surrounding inflammatory changes. Spleen: Normal in size without focal abnormality. Adrenals/Urinary Tract: Adrenal glands appear normal. Kidneys appear normal without mass, stone or hydronephrosis. No ureteral or bladder calculi identified. Bladder appears normal. Stomach/Bowel: Bowel is normal in caliber. Diverticulosis of the descending and sigmoid colon without evidence of acute diverticulitis. No bowel wall thickening or evidence of bowel wall inflammation. Appendix is normal. Stomach is unremarkable, partially decompressed. Lymphatic: No significant vascular findings are present. No enlarged abdominal or pelvic lymph nodes. Reproductive: Prostate gland is slightly irregular superiorly, with slight mass effect on the bladder base. Brachy therapy seeds in place. Other: No free fluid or abscess collection. No free intraperitoneal air. Musculoskeletal: Blastic metastases throughout the lumbar spine. Prominent blastic metastasis within the  left iliac bone. Additional subtle metastases scattered throughout the remainder of the osseous pelvis and proximal femurs. No acute appearing osseous abnormality. No acute appearing fracture line or displaced fracture fragment identified. Incidental note made of chronic bilateral pars interarticularis defects at the L5-S1 level with resultant grade 1 spondylolisthesis. Review of the MIP images confirms the above findings. IMPRESSION: 1. Diffuse blastic osseous metastases throughout the chest, abdomen and pelvis, compatible with the given history of prostate cancer. No fracture line or displaced fracture fragment identified. 2. No acute findings within the chest, abdomen or pelvis. 3. No acute appearing vascular abnormality. No thoracic or abdominal aortic aneurysm or dissection. Scattered aortic atherosclerosis. 4. Colonic diverticulosis without evidence of acute diverticulitis. 5. Slightly irregular contours of the prostate gland, compatible with the given history of prostate cancer, with associated mild mass effect on the bladder base. Electronically Signed   By: Franki Cabot M.D.   On: 01/28/2018 23:10    Procedures  Procedures (including critical care time)  Medications Ordered in ED Medications  sodium chloride 0.9 % bolus 1,000 mL (0 mLs Intravenous Stopped 01/29/18 0000)  HYDROmorphone (DILAUDID) injection 0.5 mg (0.5 mg Intravenous Given 01/28/18 2208)  iopamidol (ISOVUE-370) 76 % injection 100 mL (100 mLs Intravenous Contrast Given 01/28/18 2235)     Initial Impression / Assessment and Plan / ED Course  I have reviewed the triage vital signs and the nursing notes.  Pertinent labs & imaging results that were available during my care of the patient were reviewed by me and considered in my medical decision making (see chart for details).     66yM with CP, R neck, shoulder and RUE pain. Possible cervical radiculopathy but suspect some may be coming from bony mets as well. Doubt ACS, PE,  dissection or other emergent process.   Final Clinical Impressions(s) / ED Diagnoses   Final diagnoses:  Chest pain, unspecified type  Osseous metastasis [facility])    ED Discharge Orders        Ordered    oxyCODONE-acetaminophen (PERCOCET/ROXICET) 5-325 MG tablet  Every 4 hours PRN     01/28/18 2334       Virgel Manifold, MD 01/30/18 2027

## 2018-02-06 ENCOUNTER — Ambulatory Visit (INDEPENDENT_AMBULATORY_CARE_PROVIDER_SITE_OTHER): Payer: Medicare HMO | Admitting: Urology

## 2018-02-06 DIAGNOSIS — C778 Secondary and unspecified malignant neoplasm of lymph nodes of multiple regions: Secondary | ICD-10-CM

## 2018-02-06 DIAGNOSIS — C7951 Secondary malignant neoplasm of bone: Secondary | ICD-10-CM

## 2018-02-06 DIAGNOSIS — Z192 Hormone resistant malignancy status: Secondary | ICD-10-CM | POA: Diagnosis not present

## 2018-02-06 DIAGNOSIS — C61 Malignant neoplasm of prostate: Secondary | ICD-10-CM

## 2018-02-06 DIAGNOSIS — R3911 Hesitancy of micturition: Secondary | ICD-10-CM

## 2018-02-06 DIAGNOSIS — N401 Enlarged prostate with lower urinary tract symptoms: Secondary | ICD-10-CM

## 2018-02-07 ENCOUNTER — Encounter: Payer: Self-pay | Admitting: Radiation Oncology

## 2018-02-13 DIAGNOSIS — H5200 Hypermetropia, unspecified eye: Secondary | ICD-10-CM | POA: Diagnosis not present

## 2018-02-13 DIAGNOSIS — Z0100 Encounter for examination of eyes and vision without abnormal findings: Secondary | ICD-10-CM | POA: Diagnosis not present

## 2018-02-13 DIAGNOSIS — I10 Essential (primary) hypertension: Secondary | ICD-10-CM | POA: Diagnosis not present

## 2018-02-14 ENCOUNTER — Encounter (HOSPITAL_COMMUNITY): Payer: Self-pay | Admitting: *Deleted

## 2018-02-14 ENCOUNTER — Encounter (HOSPITAL_COMMUNITY): Payer: Self-pay | Admitting: Internal Medicine

## 2018-02-14 ENCOUNTER — Other Ambulatory Visit: Payer: Self-pay

## 2018-02-14 ENCOUNTER — Inpatient Hospital Stay (HOSPITAL_BASED_OUTPATIENT_CLINIC_OR_DEPARTMENT_OTHER): Payer: Medicare HMO | Admitting: Internal Medicine

## 2018-02-14 ENCOUNTER — Inpatient Hospital Stay (HOSPITAL_COMMUNITY): Payer: Medicare HMO | Attending: Internal Medicine

## 2018-02-14 ENCOUNTER — Inpatient Hospital Stay (HOSPITAL_COMMUNITY): Payer: Medicare HMO

## 2018-02-14 VITALS — BP 124/69 | HR 77 | Temp 97.7°F | Resp 16 | Ht 66.0 in | Wt 202.3 lb

## 2018-02-14 DIAGNOSIS — Z923 Personal history of irradiation: Secondary | ICD-10-CM | POA: Insufficient documentation

## 2018-02-14 DIAGNOSIS — I10 Essential (primary) hypertension: Secondary | ICD-10-CM

## 2018-02-14 DIAGNOSIS — R350 Frequency of micturition: Secondary | ICD-10-CM | POA: Diagnosis not present

## 2018-02-14 DIAGNOSIS — Z7952 Long term (current) use of systemic steroids: Secondary | ICD-10-CM | POA: Insufficient documentation

## 2018-02-14 DIAGNOSIS — C61 Malignant neoplasm of prostate: Secondary | ICD-10-CM | POA: Diagnosis not present

## 2018-02-14 DIAGNOSIS — Z79818 Long term (current) use of other agents affecting estrogen receptors and estrogen levels: Secondary | ICD-10-CM | POA: Insufficient documentation

## 2018-02-14 DIAGNOSIS — C7951 Secondary malignant neoplasm of bone: Secondary | ICD-10-CM

## 2018-02-14 DIAGNOSIS — Z87891 Personal history of nicotine dependence: Secondary | ICD-10-CM | POA: Insufficient documentation

## 2018-02-14 DIAGNOSIS — E7800 Pure hypercholesterolemia, unspecified: Secondary | ICD-10-CM

## 2018-02-14 DIAGNOSIS — Z79899 Other long term (current) drug therapy: Secondary | ICD-10-CM

## 2018-02-14 DIAGNOSIS — K219 Gastro-esophageal reflux disease without esophagitis: Secondary | ICD-10-CM | POA: Insufficient documentation

## 2018-02-14 DIAGNOSIS — M8580 Other specified disorders of bone density and structure, unspecified site: Secondary | ICD-10-CM | POA: Insufficient documentation

## 2018-02-14 DIAGNOSIS — C775 Secondary and unspecified malignant neoplasm of intrapelvic lymph nodes: Secondary | ICD-10-CM

## 2018-02-14 LAB — COMPREHENSIVE METABOLIC PANEL
ALBUMIN: 4 g/dL (ref 3.5–5.0)
ALK PHOS: 404 U/L — AB (ref 38–126)
ALT: 23 U/L (ref 17–63)
AST: 24 U/L (ref 15–41)
Anion gap: 11 (ref 5–15)
BILIRUBIN TOTAL: 0.5 mg/dL (ref 0.3–1.2)
BUN: 20 mg/dL (ref 6–20)
CALCIUM: 8.8 mg/dL — AB (ref 8.9–10.3)
CO2: 22 mmol/L (ref 22–32)
Chloride: 102 mmol/L (ref 101–111)
Creatinine, Ser: 0.83 mg/dL (ref 0.61–1.24)
GFR calc Af Amer: >60 mL/min (ref >60)
GFR calc non Af Amer: >60 mL/min (ref >60)
GLUCOSE: 149 mg/dL — AB (ref 65–99)
Potassium: 3.8 mmol/L (ref 3.5–5.1)
SODIUM: 135 mmol/L (ref 135–145)
TOTAL PROTEIN: 7.2 g/dL (ref 6.5–8.1)

## 2018-02-14 LAB — CBC WITH DIFFERENTIAL/PLATELET
BASOS ABS: 0 10*3/uL (ref 0.0–0.1)
Basophils Relative: 0 %
Eosinophils Absolute: 0.1 10*3/uL (ref 0.0–0.7)
Eosinophils Relative: 1 %
HEMATOCRIT: 37 % — AB (ref 39.0–52.0)
HEMOGLOBIN: 11.8 g/dL — AB (ref 13.0–17.0)
LYMPHS PCT: 27 %
Lymphs Abs: 2.2 10*3/uL (ref 0.7–4.0)
MCH: 29.3 pg (ref 26.0–34.0)
MCHC: 31.9 g/dL (ref 30.0–36.0)
MCV: 91.8 fL (ref 78.0–100.0)
MONOS PCT: 5 %
Monocytes Absolute: 0.4 10*3/uL (ref 0.1–1.0)
NEUTROS ABS: 5.4 10*3/uL (ref 1.7–7.7)
NEUTROS PCT: 67 %
Platelets: 289 10*3/uL (ref 150–400)
RBC: 4.03 MIL/uL — AB (ref 4.22–5.81)
RDW: 14.8 % (ref 11.5–15.5)
WBC: 8.1 10*3/uL (ref 4.0–10.5)

## 2018-02-14 LAB — PSA: Prostatic Specific Antigen: 68.34 ng/mL — ABNORMAL HIGH (ref 0.00–4.00)

## 2018-02-14 MED ORDER — DENOSUMAB 120 MG/1.7ML ~~LOC~~ SOLN
120.0000 mg | Freq: Once | SUBCUTANEOUS | Status: AC
  Administered 2018-02-14: 120 mg via SUBCUTANEOUS
  Filled 2018-02-14: qty 1.7

## 2018-02-14 NOTE — Progress Notes (Signed)
Patient seen by oncologist with lab review.  Ok to treat with xgeva today.  Patient taking calcium tabs as directed.  Denied tooth,jaw, and leg pain.  Patient received an xgeva shot today with no complaints voiced.  Injection site clean and dry with no bruising or swelling noted at site.  Band aid applied.  VSS with discharge and left ambulatory with wife at side.  No s/s of distress noted.

## 2018-02-14 NOTE — Patient Instructions (Signed)
[facility] at [facility]  Discharge Instructions: You received an xgeva shot today.  _______________________________________________________________  Thank you for choosing [facility] at [facility] to provide your oncology and hematology care.  To afford each patient quality time with our providers, please arrive at least 15 minutes before your scheduled appointment.  You need to re-schedule your appointment if you arrive 10 or more minutes late.  We strive to give you quality time with our providers, and arriving late affects you and other patients whose appointments are after yours.  Also, if you no show three or more times for appointments you may be dismissed from the clinic.  Again, thank you for choosing [facility] at [facility]. Our hope is that these requests will allow you access to exceptional care and in a timely manner. _______________________________________________________________  If you have questions after your visit, please contact our office at [phone] between the hours of 8:30 a.m. and 5:00 p.m. Voicemails left after 4:30 p.m. will not be returned until the following business day. _______________________________________________________________  For prescription refill requests, have your pharmacy contact our office. _______________________________________________________________  Recommendations made by the consultant and any test results will be sent to your referring physician. _______________________________________________________________ 

## 2018-02-14 NOTE — Progress Notes (Signed)
Mr. Mwangi presented today with a letter from the court system, summoning him to [employer].  Per Dr. Walden Field we wrote patient a letter to the court stating that due to his medical condition we felt he can not serve in this role.  Letter is saved in patient's record and a copy has been mailed to patient for him to submit to the court.

## 2018-02-14 NOTE — Patient Instructions (Addendum)
[place] at [facility] Discharge Instructions   You were seen today by Dr. Mathis Dad Higgs Try to increase your exercise daily, just to include walking Continue taking your Zytiga daily We will see you monthly for labs and injections We will schedule you for more scans in May or June and follow up a few days after to review results.  I have given you some information on the medication you will receive with radiation, Xofigo.   Thank you for choosing [place] at [facility] to provide your oncology and hematology care.  To afford each patient quality time with our provider, please arrive at least 15 minutes before your scheduled appointment time.    If you have a lab appointment with the [facility] please come in thru the  Main Entrance and check in at the main information desk  You need to re-schedule your appointment should you arrive 10 or more minutes late.  We strive to give you quality time with our providers, and arriving late affects you and other patients whose appointments are after yours.  Also, if you no show three or more times for appointments you may be dismissed from the clinic at the providers discretion.     Again, thank you for choosing [facility].  Our hope is that these requests will decrease the amount of time that you wait before being seen by our physicians.       _____________________________________________________________  Should you have questions after your visit to [facility], please contact our office at [phone] between the hours of 8:30 a.m. and 4:30 p.m.  Voicemails left after 4:30 p.m. will not be returned until the following business day.  For prescription refill requests, have your pharmacy contact our office.       Resources For Cancer Patients and their Caregivers ? American Cancer Society: Can assist with transportation, wigs, general needs, runs Look Good Feel  Better.        (279)160-0749 ? Cancer Care: Provides financial assistance, online support groups, medication/co-pay assistance.  1-800-813-HOPE (902)865-0384) ? [place] Assists [place] cancer patients and their families through emotional , educational and financial support.  8542424276 ? [place] DSS Where to apply for food stamps, Medicaid and utility assistance. 4243321117 ? [facility]: Transportation to medical appointments. 573 215 9508 ? Social Security Administration: May apply for disability if have a Stage IV cancer. (402) 282-5604 270-354-7261 ? [employer], Disability and Transit Services: Assists with nutrition, care and transit needs. [place] Support Programs:   > Cancer Support Group  2nd Tuesday of the month 1pm-2pm, Journey Room   > Creative Journey  3rd Tuesday of the month 1130am-1pm, Journey Room

## 2018-02-26 ENCOUNTER — Encounter: Payer: Self-pay | Admitting: Family Medicine

## 2018-02-26 ENCOUNTER — Ambulatory Visit (INDEPENDENT_AMBULATORY_CARE_PROVIDER_SITE_OTHER): Payer: Medicare HMO | Admitting: Family Medicine

## 2018-02-26 VITALS — BP 128/89 | HR 86 | Temp 97.0°F | Ht 66.0 in | Wt 205.0 lb

## 2018-02-26 DIAGNOSIS — I10 Essential (primary) hypertension: Secondary | ICD-10-CM | POA: Diagnosis not present

## 2018-02-26 DIAGNOSIS — R7303 Prediabetes: Secondary | ICD-10-CM | POA: Diagnosis not present

## 2018-02-26 DIAGNOSIS — C61 Malignant neoplasm of prostate: Secondary | ICD-10-CM | POA: Diagnosis not present

## 2018-02-26 LAB — BAYER DCA HB A1C WAIVED: HB A1C (BAYER DCA - WAIVED): 6 % (ref <7.0)

## 2018-02-26 MED ORDER — OXYCODONE-ACETAMINOPHEN 5-325 MG PO TABS: 1.0000 | ORAL_TABLET | Freq: Four times a day (QID) | ORAL | 0 refills | Status: DC | PRN

## 2018-02-26 NOTE — Progress Notes (Signed)
BP 128/89   Pulse 86   Temp (!) 97 F (36.1 C) (Oral)   Ht 5\' 6"  (1.676 m)   Wt 205 lb (93 kg)   BMI 33.09 kg/m    Subjective:    [name], male    DOB: March 21, 1951, 67 y.o.   MRN: [id number]  HPI: Walter Boone is a 67 y.o. male presenting on 02/26/2018 for Prediabetic; Hypertension; and Pain (cancer has spread to bones; went to ER, given 15 tabs of Percocet 5/325 on 01/29/18 )   HPI Prediabetes Patient comes in today for recheck of his diabetes. Patient has been currently taking no medication and we have been watching it closely because he is on prednisone for his cancer medication. Patient is not currently on an ACE inhibitor/ARB. Patient has not seen an ophthalmologist this year. Patient denies any issues with their feet.   Hypertension Patient is currently on amlodipine, and their blood pressure today is 128/89. Patient denies any lightheadedness or dizziness. Patient denies headaches, blurred vision, chest pains, shortness of breath, or weakness. Denies any side effects from medication and is content with current medication.   Chronic pain secondary to prostate cancer with bone metastases Patient is coming in with complaints of chronic pain is in his chest and his back lower back and both of his legs and pelvis that is from bony metastasis of his prostate cancer.  He was on tramadol before that was doing fine but now it is not sufficient and he went to the emergency department got some oxycodone and would like to be increased on that.  Relevant past medical, surgical, family and social history reviewed and updated as indicated. Interim medical history since our last visit reviewed. Allergies and medications reviewed and updated.  Review of Systems  Constitutional: Negative for chills and fever.  Respiratory: Positive for chest tightness (Chest pain with deep inspiration and sternum and ribs from metastasis). Negative for shortness of breath and  wheezing.   Cardiovascular: Negative for chest pain and leg swelling.  Musculoskeletal: Positive for arthralgias, back pain and myalgias. Negative for gait problem.  Skin: Negative for rash.  Neurological: Negative for dizziness, weakness and light-headedness.  All other systems reviewed and are negative.   Per HPI unless specifically indicated above   Allergies as of 02/26/2018   No Known Allergies     Medication List        Accurate as of 02/26/18  2:27 PM. Always use your most recent med list.          abiraterone acetate 250 MG tablet Commonly known as:  ZYTIGA Take 4 tablets (1,000 mg total) by mouth daily. Take on an empty stomach 1 hour before or 2 hours after a meal   amLODipine 10 MG tablet Commonly known as:  NORVASC TAKE 1 TABLET DAILY   CALCIUM 1200+D3 PO Take 3 tablets by mouth daily.   ibuprofen 800 MG tablet Commonly known as:  ADVIL,MOTRIN Take 1 tablet (800 mg total) by mouth every 8 (eight) hours as needed.   [name] IJ Inject as directed every 3 (three) months.   oxyCODONE-acetaminophen 5-325 MG tablet Commonly known as:  PERCOCET/ROXICET Take 1 tablet by mouth every 6 (six) hours as needed for severe pain.   oxyCODONE-acetaminophen 5-325 MG tablet Commonly known as:  PERCOCET/ROXICET Take 1 tablet by mouth every 6 (six) hours as needed for severe pain. Do not refill for 30 days from prescription date   oxyCODONE-acetaminophen 5-325 MG tablet  Commonly known as:  PERCOCET/ROXICET Take 1 tablet by mouth every 6 (six) hours as needed for severe pain. Do not refill until 60 days from prescription date   predniSONE 5 MG tablet Commonly known as:  DELTASONE Take 1 tablet (5 mg total) by mouth 2 (two) times daily with a meal.   tamsulosin 0.4 MG Caps capsule Commonly known as:  FLOMAX Take 1 capsule (0.4 mg total) by mouth 2 (two) times daily.   traMADol 50 MG tablet Commonly known as:  ULTRAM Take 1 tablet (50 mg total) by mouth every 6 (six)  hours as needed.   XGEVA [place] Inject into the skin every 30 (thirty) days.          Objective:    BP 128/89   Pulse 86   Temp (!) 97 F (36.1 C) (Oral)   Ht 5\' 6"  (1.676 m)   Wt 205 lb (93 kg)   BMI 33.09 kg/m   Wt Readings from Last 3 Encounters:  02/26/18 205 lb (93 kg)  02/14/18 202 lb 4.8 oz (91.8 kg)  01/28/18 189 lb (85.7 kg)    Physical Exam  Constitutional: He is oriented to person, place, and time. He appears well-developed and well-nourished. No distress.  Eyes: Conjunctivae are normal. No scleral icterus.  Neck: Neck supple. No thyromegaly present.  Cardiovascular: Normal rate, regular rhythm, normal heart sounds and intact distal pulses.  No murmur heard. Pulmonary/Chest: Effort normal and breath sounds normal. No respiratory distress. He has no wheezes. He has no rales.  Musculoskeletal: Normal range of motion. He exhibits no edema or tenderness (None reproducible tenderness).  Lymphadenopathy:    He has no cervical adenopathy.  Neurological: He is alert and oriented to person, place, and time. Coordination normal.  Skin: Skin is warm and dry. No rash noted. He is not diaphoretic.  Psychiatric: He has a normal mood and affect. His behavior is normal.  Nursing note and vitals reviewed.   Results for orders placed or performed in visit on 02/14/18  CBC with Differential  Result Value Ref Range   WBC 8.1 4.0 - 10.5 K/uL   RBC 4.03 (L) 4.22 - 5.81 MIL/uL   Hemoglobin 11.8 (L) 13.0 - 17.0 g/dL   HCT 37.0 (L) 39.0 - 52.0 %   MCV 91.8 78.0 - 100.0 fL   MCH 29.3 26.0 - 34.0 pg   MCHC 31.9 30.0 - 36.0 g/dL   RDW 14.8 11.5 - 15.5 %   Platelets 289 150 - 400 K/uL   Neutrophils Relative % 67 %   Lymphocytes Relative 27 %   Monocytes Relative 5 %   Eosinophils Relative 1 %   Basophils Relative 0 %   Neutro Abs 5.4 1.7 - 7.7 K/uL   Lymphs Abs 2.2 0.7 - 4.0 K/uL   Monocytes Absolute 0.4 0.1 - 1.0 K/uL   Eosinophils Absolute 0.1 0.0 - 0.7 K/uL   Basophils  Absolute 0.0 0.0 - 0.1 K/uL   WBC Morphology MILD LEFT SHIFT (1-5% METAS, OCC MYELO, OCC BANDS)   Comprehensive metabolic panel  Result Value Ref Range   Sodium 135 135 - 145 mmol/L   Potassium 3.8 3.5 - 5.1 mmol/L   Chloride 102 101 - 111 mmol/L   CO2 22 22 - 32 mmol/L   Glucose, Bld 149 (H) 65 - 99 mg/dL   BUN 20 6 - 20 mg/dL   Creatinine, Ser 0.83 0.61 - 1.24 mg/dL   Calcium 8.8 (L) 8.9 - 10.3 mg/dL  Total Protein 7.2 6.5 - 8.1 g/dL   Albumin 4.0 3.5 - 5.0 g/dL   AST 24 15 - 41 U/L   ALT 23 17 - 63 U/L   Alkaline Phosphatase 404 (H) 38 - 126 U/L   Total Bilirubin 0.5 0.3 - 1.2 mg/dL   GFR calc non Af Amer >60 >60 mL/min   GFR calc Af Amer >60 >60 mL/min   Anion gap 11 5 - 15  PSA  Result Value Ref Range   Prostatic Specific Antigen 68.34 (H) 0.00 - 4.00 ng/mL      Assessment & Plan:   Problem List Items Addressed This Visit      Cardiovascular and Mediastinum   Hypertension     Genitourinary   Prostate cancer ([place]) - Primary   Relevant Medications   oxyCODONE-acetaminophen (PERCOCET/ROXICET) 5-325 MG tablet   oxyCODONE-acetaminophen (PERCOCET/ROXICET) 5-325 MG tablet   oxyCODONE-acetaminophen (PERCOCET/ROXICET) 5-325 MG tablet     Other   Prediabetes   Relevant Orders   Bayer DCA Hb A1c Waived       Follow up plan: Return in about 3 months (around 05/29/2018), or if symptoms worsen or fail to improve, for Recheck pain and diabetes and hypertension.  Counseling provided for all of the vaccine components Orders Placed This Encounter  Procedures  . [facility] Hb A1c [place], MD [facility] 02/26/2018, 2:27 PM

## 2018-02-27 ENCOUNTER — Other Ambulatory Visit (HOSPITAL_COMMUNITY): Payer: Self-pay | Admitting: Emergency Medicine

## 2018-02-27 DIAGNOSIS — C61 Malignant neoplasm of prostate: Secondary | ICD-10-CM

## 2018-02-27 MED ORDER — ABIRATERONE ACETATE 250 MG PO TABS: 1000.0000 mg | ORAL_TABLET | Freq: Every day | ORAL | 0 refills | Status: DC

## 2018-02-27 NOTE — Progress Notes (Signed)
zytiga refilled 

## 2018-03-05 ENCOUNTER — Ambulatory Visit: Payer: Medicare HMO | Admitting: Radiation Oncology

## 2018-03-05 ENCOUNTER — Ambulatory Visit: Payer: Medicare HMO

## 2018-03-12 NOTE — Progress Notes (Signed)
Diagnosis No diagnosis found.  Staging Cancer Staging Prostate cancer [facility]) Staging form: Prostate, AJCC 7th Edition - Clinical: Stage IIC (pT2c, N0, M0) - Signed by Baird Cancer, PA on 06/20/2011   Assessment and Plan:  1.  Stage IIC biochemically recurrent prostate cancer, now with metastatic disease to his bone and pelvic lymph node:  -Found to have biochemical recurrence in 2012 and was started on [name] at that time. Casodex was added in 2015. His PSA began to rise and Casodex was dicontinued in 05/2016 to try to drive PSA back down. However, PSA continued to climb at that time. Therefore, Casodex was resumed in 09/2016 to try to re-challenge response.  Testosterone levels have been in castrate range.  He has continued on [name] injections every 3 months.     -PSA trends were declining and have begun to rise again.  PSA as follows: 01/01/2018: 52.04 12/20/2017: 29.84 11/22/2017: 17.45 11/08/2017: 10.64  Patient was started on Zytiga and prednisone.  Casodex has been discontinued.    Most recent CT imaging due to PSA doubling time was on 11/16/2017 indicating new enlarged right pelvic sidewall lymph node and borderline left periaortic lymph node which were worrisome for metastatic adenopathy.  Bone scan from 11/13/2017 demonstrated progression of bone metastases.   He is tolerating  Zytiga and prednisone well which was started in 12/2017.  He notes less pain in his neck, left shoulder and small joints. -Most recent testosterone level shows that patient is at castrate levels.  He is on [name] every 3 months. PSA elevated at 68.  He will be set up for repeat imaging in May 2019.    2.  Urinary frequency/Incomplete bladder emptying: -Due to radiation therapy. -Continue Flomax as prescribed.  Continue follow-up with urology.    3.  Bone mets.  He is on Xgeva monthly.     Current status: Pt is seen today for follow-up prior to [facility].  He remains on Zytiga and Prednisone.       Prostate cancer ([place])   06/20/2011 Initial Diagnosis    Prostate cancer ([place])      01/12/2016 Imaging    Bone density- This patient is considered osteopenic by World Healh Organization (WHO) Criteria.       03/16/2016 Imaging    CT CAP- No evidence of metastatic disease within the chest, abdomen, or pelvis. No other acute findings identified.      06/17/2016 Imaging    Bone scan- 1. Negative for metastatic pattern.      07/11/2016 Miscellaneous    Prolia every 6 months for osteopenia.      09/12/2016 -  Chemotherapy    Casodex/Depo-[name] every 3 months       09/27/2016 Imaging    CT CAP -No acute findings and no evidence for metastatic disease within the chest, abdomen or pelvis.      11/13/2017 Progression    Bone Scan: Multiple new abnormal sites of increased osseous tracer accumulation are identified as above consistent with osseous metastatic disease.  These include new foci of uptake at the proximal femora bilaterally.      11/16/2017 Progression    CT CAP-IMPRESSION: 1. New enlarged right pelvic sidewall lymph node and borderline left periaortic lymph node worrisome for metastatic adenopathy. 2. Subtle areas of sclerosis within the lumbar spine are more conspicuous than on the previous exam and worrisome for metastatic disease. Overall the areas of bone metastasis are more better demonstrated on bone scan from 11/13/2017 which demonstrate a progression  of bone metastasis.        Problem List Patient Active Problem List   Diagnosis Date Noted  . Chronic right-sided low back pain with right-sided sciatica [M54.41, G89.29] 07/06/2017  . GERD (gastroesophageal reflux disease) [K21.9] 11/22/2016  . Urinary frequency [R35.0] 09/13/2016  . Knee pain, left [M25.562] 02/11/2016  . Prediabetes [R73.03] 01/27/2016  . Osteopenia determined by x-ray [M85.80] 02/01/2015  . Hypertension [I10]   . Hyperlipidemia [E78.5]   . Gynecomastia, male [N62] 01/11/2013  .  Prostate cancer ([place]) [C61] 06/20/2011  . H N P-CERVICAL [M50.20] 10/05/2009  . CERVICAL SPASM [S13.9XXA] 10/05/2009    Past Medical History Past Medical History:  Diagnosis Date  . Acid reflux   . Alcoholism ([place])   . Arthritis   . Fatty liver   . Gynecomastia, male 01/11/2013   Secondary to prostate ca Tx.   . H/O Bell's palsy   . Hemorrhoids    history  . High cholesterol   . History of back injury 11/19/2013  . Hyperglycemia   . Hyperlipidemia   . Hypertension   . Prostate cancer ([place])   . Prostate cancer ([place]) 06/20/2011  . Prostate carcinoma, recurrent ([place]) 08/10/2012    Past Surgical History Past Surgical History:  Procedure Laterality Date  . [place]   left  . COLONOSCOPY  2008  . ESOPHAGOGASTRODUODENOSCOPY    . HERNIA REPAIR  2010  . I&D EXTREMITY Left 06/10/2015   Procedure: IRRIGATION AND DEBRIDEMENT EXTREMITY LEFT HAND EXPLORATION, nerve repair;  Surgeon: Charlotte Crumb, MD;  Location: [place];  Service: Orthopedics;  Laterality: Left;  . KNEE SURGERY     left  . PROSTATE BIOPSY  11/06    Family History Family History  Problem Relation Age of Onset  . Heart failure Mother   . Diabetes Father   . Cancer Sister   . Diabetes Brother      Social History  reports that he has quit smoking. He has a 7.50 pack-year smoking history. He has never used smokeless tobacco. He reports that he does not drink alcohol or use drugs.  Medications  Current Outpatient Medications:  .  amLODipine (NORVASC) 10 MG tablet, TAKE 1 TABLET DAILY, Disp: 30 tablet, Rfl: 5 .  Calcium-Magnesium-Vitamin D (CALCIUM 1200+D3 PO), Take 3 tablets by mouth daily. , Disp: , Rfl:  .  Denosumab (XGEVA [place]), Inject into the skin every 30 (thirty) days. , Disp: , Rfl:  .  ibuprofen (ADVIL,MOTRIN) 800 MG tablet, Take 1 tablet (800 mg total) by mouth every 8 (eight) hours as needed. (Patient taking differently: Take 800 mg by mouth daily as needed. ), Disp: 30 tablet, Rfl: 3 .   Leuprolide Acetate ([name] IJ), Inject as directed every 3 (three) months., Disp: , Rfl:  .  predniSONE (DELTASONE) 5 MG tablet, Take 1 tablet (5 mg total) by mouth 2 (two) times daily with a meal., Disp: 60 tablet, Rfl: 5 .  tamsulosin (FLOMAX) 0.4 MG CAPS capsule, Take 1 capsule (0.4 mg total) by mouth 2 (two) times daily., Disp: 60 capsule, Rfl: 2 .  traMADol (ULTRAM) 50 MG tablet, Take 1 tablet (50 mg total) by mouth every 6 (six) hours as needed., Disp: 120 tablet, Rfl: 2 .  abiraterone acetate (ZYTIGA) 250 MG tablet, Take 4 tablets (1,000 mg total) by mouth daily. Take on an empty stomach 1 hour before or 2 hours after a meal, Disp: 120 tablet, Rfl: 0 .  oxyCODONE-acetaminophen (PERCOCET/ROXICET) 5-325 MG tablet, Take 1  tablet by mouth every 6 (six) hours as needed for severe pain., Disp: 120 tablet, Rfl: 0 .  oxyCODONE-acetaminophen (PERCOCET/ROXICET) 5-325 MG tablet, Take 1 tablet by mouth every 6 (six) hours as needed for severe pain. Do not refill for 30 days from prescription date, Disp: 120 tablet, Rfl: 0 .  oxyCODONE-acetaminophen (PERCOCET/ROXICET) 5-325 MG tablet, Take 1 tablet by mouth every 6 (six) hours as needed for severe pain. Do not refill until 60 days from prescription date, Disp: 120 tablet, Rfl: 0  Allergies Patient has no known allergies.  Review of Systems Review of Systems - Oncology ROS as per HPI otherwise 12 point ROS is negative.   Physical Exam  Vitals Wt Readings from Last 3 Encounters:  02/26/18 205 lb (93 kg)  02/14/18 202 lb 4.8 oz (91.8 kg)  01/28/18 189 lb (85.7 kg)   Temp Readings from Last 3 Encounters:  02/26/18 (!) 97 F (36.1 C) (Oral)  02/14/18 97.7 F (36.5 C) (Oral)  01/28/18 98 F (36.7 C) (Oral)   BP Readings from Last 3 Encounters:  02/26/18 128/89  02/14/18 124/69  01/29/18 104/65   Pulse Readings from Last 3 Encounters:  02/26/18 86  02/14/18 77  01/29/18 79    Constitutional: Well-developed, well-nourished, and in no  distress.   HENT: Head: Normocephalic and atraumatic.  Mouth/Throat: No oropharyngeal exudate. Mucosa moist. Eyes: Pupils are equal, round, and reactive to light. Conjunctivae are normal. No scleral icterus.  Neck: Normal range of motion. Neck supple. No JVD present.  Cardiovascular: Normal rate, regular rhythm and normal heart sounds.  Exam reveals no gallop and no friction rub.   No murmur heard. Pulmonary/Chest: Effort normal and breath sounds normal. No respiratory distress. No wheezes.No rales.  Abdominal: Soft. Bowel sounds are normal. No distension. There is no tenderness. There is no guarding.  Musculoskeletal: No edema or tenderness.  Lymphadenopathy: No cervical, axillary or supraclavicular adenopathy.  Neurological: Alert and oriented to person, place, and time. No cranial nerve deficit.  Skin: Skin is warm and dry. No rash noted. No erythema. No pallor.  Psychiatric: Affect and judgment normal.   Labs Appointment on 02/14/2018  Component Date Value Ref Range Status  . WBC 02/14/2018 8.1  4.0 - 10.5 K/uL Final  . RBC 02/14/2018 4.03* 4.22 - 5.81 MIL/uL Final  . Hemoglobin 02/14/2018 11.8* 13.0 - 17.0 g/dL Final  . HCT 02/14/2018 37.0* 39.0 - 52.0 % Final  . MCV 02/14/2018 91.8  78.0 - 100.0 fL Final  . MCH 02/14/2018 29.3  26.0 - 34.0 pg Final  . MCHC 02/14/2018 31.9  30.0 - 36.0 g/dL Final  . RDW 02/14/2018 14.8  11.5 - 15.5 % Final  . Platelets 02/14/2018 289  150 - 400 K/uL Final  . Neutrophils Relative % 02/14/2018 67  % Final  . Lymphocytes Relative 02/14/2018 27  % Final  . Monocytes Relative 02/14/2018 5  % Final  . Eosinophils Relative 02/14/2018 1  % Final  . Basophils Relative 02/14/2018 0  % Final  . Neutro Abs 02/14/2018 5.4  1.7 - 7.7 K/uL Final  . Lymphs Abs 02/14/2018 2.2  0.7 - 4.0 K/uL Final  . Monocytes Absolute 02/14/2018 0.4  0.1 - 1.0 K/uL Final  . Eosinophils Absolute 02/14/2018 0.1  0.0 - 0.7 K/uL Final  . Basophils Absolute 02/14/2018 0.0  0.0 -  0.1 K/uL Final  . WBC Morphology 02/14/2018 MILD LEFT SHIFT (1-5% METAS, OCC MYELO, OCC BANDS)   Final   Performed at [facility], [address], [place], [place] [address]  . Sodium 02/14/2018 135  135 - 145 mmol/L Final  . Potassium 02/14/2018 3.8  3.5 - 5.1 mmol/L Final  . Chloride 02/14/2018 102  101 - 111 mmol/L Final  . CO2 02/14/2018 22  22 - 32 mmol/L Final  . Glucose, Bld 02/14/2018 149* 65 - 99 mg/dL Final  . BUN 02/14/2018 20  6 - 20 mg/dL Final  . Creatinine, Ser 02/14/2018 0.83  0.61 - 1.24 mg/dL Final  . Calcium 02/14/2018 8.8* 8.9 - 10.3 mg/dL Final  . Total Protein 02/14/2018 7.2  6.5 - 8.1 g/dL Final  . Albumin 02/14/2018 4.0  3.5 - 5.0 g/dL Final  . AST 02/14/2018 24  15 - 41 U/L Final  . ALT 02/14/2018 23  17 - 63 U/L Final  . Alkaline Phosphatase 02/14/2018 404* 38 - 126 U/L Final  . Total Bilirubin 02/14/2018 0.5  0.3 - 1.2 mg/dL Final  . GFR calc non Af Amer 02/14/2018 >60  >60 mL/min Final  . GFR calc Af Amer 02/14/2018 >60  >60 mL/min Final   Comment: (NOTE) The eGFR has been calculated using the CKD EPI equation. This calculation has not been validated in all clinical situations. eGFR's persistently <60 mL/min signify possible Chronic Kidney Disease.   Georgiann Hahn gap 02/14/2018 11  5 - 15 Final   Performed at [facility], [address], [place], [place] [address]  . Prostatic Specific Antigen 02/14/2018 68.34* 0.00 - 4.00 ng/mL Final   Comment: (NOTE) While PSA levels of <=4.0 ng/ml are reported as reference range, some men with levels below 4.0 ng/ml can have prostate cancer and many men with PSA above 4.0 ng/ml do not have prostate cancer.  Other tests such as free PSA, age specific reference ranges, PSA velocity and PSA doubling time may be helpful especially in men less than 37 years old. Performed at [facility], [place] [address] [place], [place] [address]      Pathology No orders of the defined types were placed in this  encounter.      Zoila Shutter MD

## 2018-03-14 ENCOUNTER — Other Ambulatory Visit (HOSPITAL_COMMUNITY): Payer: Self-pay

## 2018-03-14 ENCOUNTER — Inpatient Hospital Stay (HOSPITAL_COMMUNITY): Payer: Medicare HMO | Attending: Internal Medicine

## 2018-03-14 ENCOUNTER — Ambulatory Visit (HOSPITAL_COMMUNITY): Payer: Self-pay

## 2018-03-14 ENCOUNTER — Encounter (HOSPITAL_COMMUNITY): Payer: Self-pay

## 2018-03-14 ENCOUNTER — Ambulatory Visit (HOSPITAL_COMMUNITY): Payer: Self-pay | Admitting: Hematology

## 2018-03-14 ENCOUNTER — Inpatient Hospital Stay (HOSPITAL_COMMUNITY): Payer: Medicare HMO

## 2018-03-14 VITALS — BP 131/67 | HR 83 | Temp 98.7°F | Resp 20

## 2018-03-14 DIAGNOSIS — C7951 Secondary malignant neoplasm of bone: Secondary | ICD-10-CM | POA: Insufficient documentation

## 2018-03-14 DIAGNOSIS — C61 Malignant neoplasm of prostate: Secondary | ICD-10-CM

## 2018-03-14 DIAGNOSIS — Z79818 Long term (current) use of other agents affecting estrogen receptors and estrogen levels: Secondary | ICD-10-CM | POA: Insufficient documentation

## 2018-03-14 DIAGNOSIS — C775 Secondary and unspecified malignant neoplasm of intrapelvic lymph nodes: Secondary | ICD-10-CM | POA: Diagnosis not present

## 2018-03-14 DIAGNOSIS — Z923 Personal history of irradiation: Secondary | ICD-10-CM | POA: Insufficient documentation

## 2018-03-14 DIAGNOSIS — Z79899 Other long term (current) drug therapy: Secondary | ICD-10-CM | POA: Diagnosis not present

## 2018-03-14 DIAGNOSIS — M8580 Other specified disorders of bone density and structure, unspecified site: Secondary | ICD-10-CM

## 2018-03-14 LAB — COMPREHENSIVE METABOLIC PANEL
ALT: 20 U/L (ref 17–63)
ANION GAP: 12 (ref 5–15)
AST: 22 U/L (ref 15–41)
Albumin: 3.9 g/dL (ref 3.5–5.0)
Alkaline Phosphatase: 364 U/L — ABNORMAL HIGH (ref 38–126)
BUN: 24 mg/dL — ABNORMAL HIGH (ref 6–20)
CALCIUM: 8.9 mg/dL (ref 8.9–10.3)
CHLORIDE: 107 mmol/L (ref 101–111)
CO2: 20 mmol/L — AB (ref 22–32)
Creatinine, Ser: 0.83 mg/dL (ref 0.61–1.24)
Glucose, Bld: 153 mg/dL — ABNORMAL HIGH (ref 65–99)
Potassium: 3.8 mmol/L (ref 3.5–5.1)
SODIUM: 139 mmol/L (ref 135–145)
Total Bilirubin: 0.6 mg/dL (ref 0.3–1.2)
Total Protein: 7.2 g/dL (ref 6.5–8.1)

## 2018-03-14 LAB — CBC WITH DIFFERENTIAL/PLATELET
Basophils Absolute: 0 10*3/uL (ref 0.0–0.1)
Basophils Relative: 0 %
EOS ABS: 0.2 10*3/uL (ref 0.0–0.7)
Eosinophils Relative: 2 %
HCT: 34.9 % — ABNORMAL LOW (ref 39.0–52.0)
Hemoglobin: 11.4 g/dL — ABNORMAL LOW (ref 13.0–17.0)
LYMPHS ABS: 2.4 10*3/uL (ref 0.7–4.0)
Lymphocytes Relative: 33 %
MCH: 29.7 pg (ref 26.0–34.0)
MCHC: 32.7 g/dL (ref 30.0–36.0)
MCV: 90.9 fL (ref 78.0–100.0)
MONO ABS: 0.4 10*3/uL (ref 0.1–1.0)
MONOS PCT: 5 %
Neutro Abs: 4.3 10*3/uL (ref 1.7–7.7)
Neutrophils Relative %: 60 %
PLATELETS: 306 10*3/uL (ref 150–400)
RBC: 3.84 MIL/uL — ABNORMAL LOW (ref 4.22–5.81)
RDW: 15.3 % (ref 11.5–15.5)
WBC: 7.2 10*3/uL (ref 4.0–10.5)

## 2018-03-14 LAB — PSA: PROSTATIC SPECIFIC ANTIGEN: 67.59 ng/mL — AB (ref 0.00–4.00)

## 2018-03-14 MED ORDER — DENOSUMAB 120 MG/1.7ML ~~LOC~~ SOLN
120.0000 mg | Freq: Once | SUBCUTANEOUS | Status: AC
  Administered 2018-03-14: 120 mg via SUBCUTANEOUS
  Filled 2018-03-14: qty 1.7

## 2018-03-14 MED ORDER — LEUPROLIDE ACETATE (3 MONTH) 22.5 MG IM KIT
22.5000 mg | PACK | Freq: Once | INTRAMUSCULAR | Status: AC
  Administered 2018-03-14: 22.5 mg via INTRAMUSCULAR
  Filled 2018-03-14: qty 22.5

## 2018-03-14 NOTE — Patient Instructions (Signed)
[place] at [facility]  Discharge Instructions:  You received Delton See and [name] today.  _______________________________________________________________  Thank you for choosing [place] at [facility] to provide your oncology and hematology care.  To afford each patient quality time with our providers, please arrive at least 15 minutes before your scheduled appointment.  You need to re-schedule your appointment if you arrive 10 or more minutes late.  We strive to give you quality time with our providers, and arriving late affects you and other patients whose appointments are after yours.  Also, if you no show three or more times for appointments you may be dismissed from the clinic.  Again, thank you for choosing [place] at [place] hope is that these requests will allow you access to exceptional care and in a timely manner. _______________________________________________________________  If you have questions after your visit, please contact our office at [phone] between the hours of 8:30 a.m. and 5:00 p.m. Voicemails left after 4:30 p.m. will not be returned until the following business day. _______________________________________________________________  For prescription refill requests, have your pharmacy contact our office. _______________________________________________________________  Recommendations made by the consultant and any test results will be sent to your referring physician. _______________________________________________________________

## 2018-03-14 NOTE — Progress Notes (Signed)
Patient to treatment room for lupron and xgeva.  Stated he is taking a calcium tab as directed.  Denied tooth, jaw, and leg pain. No recent or upcoming dental visits.  Taking Zytiga and prednisone as directed.  No problems stated with the zytiga.  No constipation or diarrhea.  No problems with SOB.    Patient tolerated injections with no complaints voiced.  Both sites clean and dry with no bruising or swelling noted.  No complaints of pain.  Band aids applied.  VSS with discharge and left ambulatory with no s/s of distress note.

## 2018-03-26 ENCOUNTER — Other Ambulatory Visit (HOSPITAL_COMMUNITY): Payer: Self-pay | Admitting: Emergency Medicine

## 2018-03-26 DIAGNOSIS — C61 Malignant neoplasm of prostate: Secondary | ICD-10-CM

## 2018-03-26 MED ORDER — ABIRATERONE ACETATE 250 MG PO TABS: 1000.0000 mg | ORAL_TABLET | Freq: Every day | ORAL | 0 refills | Status: DC

## 2018-03-26 NOTE — Progress Notes (Signed)
zytiga refilled 

## 2018-04-11 ENCOUNTER — Inpatient Hospital Stay (HOSPITAL_COMMUNITY): Payer: Medicare HMO | Attending: Hematology

## 2018-04-11 ENCOUNTER — Inpatient Hospital Stay (HOSPITAL_COMMUNITY): Payer: Medicare HMO

## 2018-04-11 ENCOUNTER — Ambulatory Visit (HOSPITAL_COMMUNITY): Payer: Self-pay

## 2018-04-11 ENCOUNTER — Other Ambulatory Visit (HOSPITAL_COMMUNITY): Payer: Self-pay

## 2018-04-11 DIAGNOSIS — Y842 Radiological procedure and radiotherapy as the cause of abnormal reaction of the patient, or of later complication, without mention of misadventure at the time of the procedure: Secondary | ICD-10-CM | POA: Diagnosis not present

## 2018-04-11 DIAGNOSIS — Z79899 Other long term (current) drug therapy: Secondary | ICD-10-CM | POA: Diagnosis not present

## 2018-04-11 DIAGNOSIS — R350 Frequency of micturition: Secondary | ICD-10-CM | POA: Diagnosis not present

## 2018-04-11 DIAGNOSIS — C7951 Secondary malignant neoplasm of bone: Secondary | ICD-10-CM | POA: Diagnosis not present

## 2018-04-11 DIAGNOSIS — Z79818 Long term (current) use of other agents affecting estrogen receptors and estrogen levels: Secondary | ICD-10-CM | POA: Insufficient documentation

## 2018-04-11 DIAGNOSIS — E7800 Pure hypercholesterolemia, unspecified: Secondary | ICD-10-CM | POA: Diagnosis not present

## 2018-04-11 DIAGNOSIS — M8580 Other specified disorders of bone density and structure, unspecified site: Secondary | ICD-10-CM | POA: Insufficient documentation

## 2018-04-11 DIAGNOSIS — K219 Gastro-esophageal reflux disease without esophagitis: Secondary | ICD-10-CM | POA: Insufficient documentation

## 2018-04-11 DIAGNOSIS — C61 Malignant neoplasm of prostate: Secondary | ICD-10-CM

## 2018-04-11 DIAGNOSIS — I10 Essential (primary) hypertension: Secondary | ICD-10-CM | POA: Insufficient documentation

## 2018-04-11 DIAGNOSIS — Z87891 Personal history of nicotine dependence: Secondary | ICD-10-CM | POA: Diagnosis not present

## 2018-04-11 DIAGNOSIS — I700 Atherosclerosis of aorta: Secondary | ICD-10-CM | POA: Insufficient documentation

## 2018-04-11 DIAGNOSIS — R110 Nausea: Secondary | ICD-10-CM | POA: Insufficient documentation

## 2018-04-11 LAB — COMPREHENSIVE METABOLIC PANEL
ALT: 19 U/L (ref 17–63)
ANION GAP: 9 (ref 5–15)
AST: 21 U/L (ref 15–41)
Albumin: 3.5 g/dL (ref 3.5–5.0)
Alkaline Phosphatase: 213 U/L — ABNORMAL HIGH (ref 38–126)
BUN: 27 mg/dL — ABNORMAL HIGH (ref 6–20)
CO2: 21 mmol/L — AB (ref 22–32)
Calcium: 7.8 mg/dL — ABNORMAL LOW (ref 8.9–10.3)
Chloride: 106 mmol/L (ref 101–111)
Creatinine, Ser: 0.93 mg/dL (ref 0.61–1.24)
Glucose, Bld: 145 mg/dL — ABNORMAL HIGH (ref 65–99)
POTASSIUM: 4 mmol/L (ref 3.5–5.1)
SODIUM: 136 mmol/L (ref 135–145)
Total Bilirubin: 0.8 mg/dL (ref 0.3–1.2)
Total Protein: 6.9 g/dL (ref 6.5–8.1)

## 2018-04-11 LAB — CBC WITH DIFFERENTIAL/PLATELET
Basophils Absolute: 0 10*3/uL (ref 0.0–0.1)
Basophils Relative: 0 %
EOS ABS: 0.1 10*3/uL (ref 0.0–0.7)
EOS PCT: 2 %
HCT: 31.7 % — ABNORMAL LOW (ref 39.0–52.0)
Hemoglobin: 10.3 g/dL — ABNORMAL LOW (ref 13.0–17.0)
LYMPHS ABS: 1.9 10*3/uL (ref 0.7–4.0)
Lymphocytes Relative: 27 %
MCH: 29.7 pg (ref 26.0–34.0)
MCHC: 32.5 g/dL (ref 30.0–36.0)
MCV: 91.4 fL (ref 78.0–100.0)
MONO ABS: 0.5 10*3/uL (ref 0.1–1.0)
MONOS PCT: 7 %
Neutro Abs: 4.4 10*3/uL (ref 1.7–7.7)
Neutrophils Relative %: 64 %
PLATELETS: 193 10*3/uL (ref 150–400)
RBC: 3.47 MIL/uL — AB (ref 4.22–5.81)
RDW: 15.5 % (ref 11.5–15.5)
WBC: 6.9 10*3/uL (ref 4.0–10.5)

## 2018-04-11 NOTE — Progress Notes (Signed)
Reviewed lab work with Dr. Walden Field and calcium 7.8 today.  Hold Xgeva today and repeat in 3 weeks with xgeva shot.  Pharmacy notified and patient with understanding verbalized.     Patient stated he has not been taking his calcium three times a day as directed.  Only when he remembers to take the calcium.  Instructed the patient to take it TID and we will recheck in 3 weeks.

## 2018-04-16 ENCOUNTER — Other Ambulatory Visit (HOSPITAL_COMMUNITY): Payer: Self-pay | Admitting: Emergency Medicine

## 2018-04-16 DIAGNOSIS — C61 Malignant neoplasm of prostate: Secondary | ICD-10-CM

## 2018-04-16 MED ORDER — ABIRATERONE ACETATE 250 MG PO TABS: 1000.0000 mg | ORAL_TABLET | Freq: Every day | ORAL | 0 refills | Status: DC

## 2018-04-16 NOTE — Progress Notes (Signed)
zytiga refilled 

## 2018-04-18 ENCOUNTER — Other Ambulatory Visit: Payer: Self-pay | Admitting: Family Medicine

## 2018-04-18 DIAGNOSIS — G8929 Other chronic pain: Secondary | ICD-10-CM

## 2018-04-18 DIAGNOSIS — M5441 Lumbago with sciatica, right side: Principal | ICD-10-CM

## 2018-04-19 ENCOUNTER — Other Ambulatory Visit (HOSPITAL_COMMUNITY): Payer: Self-pay | Admitting: Adult Health

## 2018-04-25 ENCOUNTER — Other Ambulatory Visit (HOSPITAL_COMMUNITY): Payer: Self-pay | Admitting: Internal Medicine

## 2018-04-25 ENCOUNTER — Encounter: Payer: Self-pay | Admitting: Family Medicine

## 2018-04-25 ENCOUNTER — Ambulatory Visit (INDEPENDENT_AMBULATORY_CARE_PROVIDER_SITE_OTHER): Payer: Medicare HMO | Admitting: Family Medicine

## 2018-04-25 ENCOUNTER — Telehealth (HOSPITAL_COMMUNITY): Payer: Self-pay

## 2018-04-25 VITALS — BP 127/81 | HR 80 | Temp 97.2°F | Ht 66.0 in | Wt 202.0 lb

## 2018-04-25 DIAGNOSIS — M5441 Lumbago with sciatica, right side: Secondary | ICD-10-CM | POA: Diagnosis not present

## 2018-04-25 DIAGNOSIS — C61 Malignant neoplasm of prostate: Secondary | ICD-10-CM

## 2018-04-25 DIAGNOSIS — G8929 Other chronic pain: Secondary | ICD-10-CM

## 2018-04-25 DIAGNOSIS — R7303 Prediabetes: Secondary | ICD-10-CM

## 2018-04-25 DIAGNOSIS — E782 Mixed hyperlipidemia: Secondary | ICD-10-CM | POA: Diagnosis not present

## 2018-04-25 DIAGNOSIS — I10 Essential (primary) hypertension: Secondary | ICD-10-CM | POA: Diagnosis not present

## 2018-04-25 LAB — BAYER DCA HB A1C WAIVED: HB A1C (BAYER DCA - WAIVED): 6.1 % (ref <7.0)

## 2018-04-25 MED ORDER — TRAMADOL HCL 50 MG PO TABS: 50.0000 mg | ORAL_TABLET | Freq: Four times a day (QID) | ORAL | 0 refills | Status: DC | PRN

## 2018-04-25 MED ORDER — AMLODIPINE BESYLATE 10 MG PO TABS: 10.0000 mg | ORAL_TABLET | Freq: Every day | ORAL | 3 refills | Status: DC

## 2018-04-25 MED ORDER — TAMSULOSIN HCL 0.4 MG PO CAPS: 0.4000 mg | ORAL_CAPSULE | Freq: Two times a day (BID) | ORAL | 3 refills | Status: DC

## 2018-04-25 MED ORDER — IBUPROFEN 800 MG PO TABS: 800.0000 mg | ORAL_TABLET | Freq: Three times a day (TID) | ORAL | 3 refills | Status: DC | PRN

## 2018-04-25 NOTE — Telephone Encounter (Signed)
Patient called today to see if Dr. Walden Field wanted to see him before [place] goes out of the country because he had stopped his zytiga about 3 weeks ago. Per patient " it made me feel weak all the time and nauseated. Per patient I am feeling better now. Dr. Walden Field made aware, she wanted more scans and tests done before she sees him if possible.  Patient called to let him know what the doctor  had ordered and he stated that he did not know if he could get the tests done on Friday or anything done on Friday.   He is scheduled for labs and injection on Friday as well and the patient is well aware of this. Will inform Dr. Walden Field.

## 2018-04-25 NOTE — Progress Notes (Signed)
BP 127/81   Pulse 80   Temp (!) 97.2 F (36.2 C) (Oral)   Ht 5\' 6"  (1.676 m)   Wt 202 lb (91.6 kg)   BMI 32.60 kg/m    Subjective:    [name], male    DOB: November 01, 1951, 67 y.o.   MRN: [id number]  HPI: Walter Boone is a 67 y.o. male presenting on 04/25/2018 for Discuss medications (Leaving this Sunday to go to [place], will be gone for over a month, concerned about getting his medications; is not taking Oxycodone, is taking Tramadol instead, feels it helps him better)   HPI Prediabetes Patient comes in today for recheck of his diabetes. Patient has been currently taking no medication and we have been monitoring, he is on prednisone for his cancer and is likely the reason he is been showing up is prediabetic at times.. Patient is not currently on an ACE inhibitor/ARB. Patient has not seen an ophthalmologist this year. Patient denies any issues with their feet.   Hypertension Patient is currently on amlodipine, and their blood pressure today is 147/81. Patient denies any lightheadedness or dizziness. Patient denies headaches, blurred vision, chest pains, shortness of breath, or weakness. Denies any side effects from medication and is content with current medication.   Hyperlipidemia Patient is coming in for recheck of his hyperlipidemia. The patient is currently taking no medications because we are just monitoring and because 10-year life span is not very high with stage IV prostate cancer. They deny any issues with myalgias or history of liver damage from it. They deny any focal numbness or weakness or chest pain.   Chronic back pain and chest pain from metastasis from prostate cancer Patient has been having trouble getting back into his oncology we will give them a call to help get him back but he did stop his Zytiga because it made him too ill he was to get back and see them.  Patient is coming in for medication refill to manage his pain.  He says the oxycodone  was too strong for him and made him sleepy and dizzy and he cannot drive and so he wants to go back to the tramadol which did help and did not make him feel that way.  A lot of his pain is from the bone metastasis in his spine sternum lower head and other bony areas throughout his body.  Relevant past medical, surgical, family and social history reviewed and updated as indicated. Interim medical history since our last visit reviewed. Allergies and medications reviewed and updated.  Review of Systems  Constitutional: Negative for chills and fever.  Eyes: Negative for discharge.  Respiratory: Negative for shortness of breath and wheezing.   Cardiovascular: Positive for chest pain. Negative for leg swelling.  Musculoskeletal: Positive for arthralgias and back pain. Negative for gait problem.  Skin: Negative for rash.  Neurological: Negative for dizziness, weakness, light-headedness and numbness.  Psychiatric/Behavioral: Negative for decreased concentration and dysphoric mood.  All other systems reviewed and are negative.   Per HPI unless specifically indicated above   Allergies as of 04/25/2018   No Known Allergies     Medication List        Accurate as of 04/25/18  9:04 AM. Always use your most recent med list.          abiraterone acetate 250 MG tablet Commonly known as:  ZYTIGA Take 4 tablets (1,000 mg total) by mouth daily. Take on an empty stomach  1 hour before or 2 hours after a meal   amLODipine 10 MG tablet Commonly known as:  NORVASC Take 1 tablet (10 mg total) by mouth daily.   CALCIUM 1200+D3 PO Take 3 tablets by mouth daily.   ibuprofen 800 MG tablet Commonly known as:  ADVIL,MOTRIN Take 1 tablet (800 mg total) by mouth every 8 (eight) hours as needed.   [name] IJ Inject as directed every 3 (three) months.   predniSONE 5 MG tablet Commonly known as:  DELTASONE Take 1 tablet (5 mg total) by mouth 2 (two) times daily with a meal.   tamsulosin 0.4 MG Caps  capsule Commonly known as:  FLOMAX Take 1 capsule (0.4 mg total) by mouth 2 (two) times daily.   traMADol 50 MG tablet Commonly known as:  ULTRAM Take 1 tablet (50 mg total) by mouth every 6 (six) hours as needed.   XGEVA [place] Inject into the skin every 30 (thirty) days.          Objective:    BP 127/81   Pulse 80   Temp (!) 97.2 F (36.2 C) (Oral)   Ht 5\' 6"  (1.676 m)   Wt 202 lb (91.6 kg)   BMI 32.60 kg/m   Wt Readings from Last 3 Encounters:  04/25/18 202 lb (91.6 kg)  02/26/18 205 lb (93 kg)  02/14/18 202 lb 4.8 oz (91.8 kg)    Physical Exam  Constitutional: He is oriented to person, place, and time. He appears well-developed and well-nourished. No distress.  Eyes: Conjunctivae are normal. No scleral icterus.  Neck: Neck supple. No thyromegaly present.  Cardiovascular: Normal rate, regular rhythm, normal heart sounds and intact distal pulses.  No murmur heard. Pulmonary/Chest: Effort normal and breath sounds normal. No respiratory distress. He has no wheezes.  Musculoskeletal: Normal range of motion. He exhibits tenderness (Low back tenderness bilaterally upon palpation, no other reproducible tenderness). He exhibits no edema.  Lymphadenopathy:    He has no cervical adenopathy.  Neurological: He is alert and oriented to person, place, and time. Coordination normal.  Skin: Skin is warm and dry. No rash noted. He is not diaphoretic.  Psychiatric: He has a normal mood and affect. His behavior is normal.  Nursing note and vitals reviewed.       Assessment & Plan:   Problem List Items Addressed This Visit      Cardiovascular and Mediastinum   Hypertension   Relevant Medications   amLODipine (NORVASC) 10 MG tablet     Nervous and Auditory   Chronic right-sided low back pain with right-sided sciatica   Relevant Medications   ibuprofen (ADVIL,MOTRIN) 800 MG tablet   traMADol (ULTRAM) 50 MG tablet     Genitourinary   Primary prostate cancer with metastasis  from prostate to other site [facility])   Relevant Medications   ibuprofen (ADVIL,MOTRIN) 800 MG tablet   tamsulosin (FLOMAX) 0.4 MG CAPS capsule     Other   Hyperlipidemia   Relevant Medications   amLODipine (NORVASC) 10 MG tablet   Prediabetes - Primary   Relevant Orders   Bayer DCA Hb A1c Waived    Other Visit Diagnoses    Prostate cancer ([place])       Low back pain is improved now that he is on steroids.  Will give ibuprofen   Relevant Medications   ibuprofen (ADVIL,MOTRIN) 800 MG tablet       Patient says that oxycodone was too much to be to feel dizzy and he could not  drive and he was always sleepy and would like to switch back to tramadol.  Will do refill for tramadol but that he is going to [place] for a month and 1/2 to 2 months so instructed him to call in from [place] with a pharmacy number that he wants [place] to call in his medication next month.  Gave 90-day prescriptions for all other medications so that he can have sufficient. Follow up plan: Return in about 3 months (around 07/26/2018), or if symptoms worsen or fail to improve, for Diabetes and pain management follow-up.  Counseling provided for all of the vaccine components Orders Placed This Encounter  Procedures  . [facility] Hb A1c [place], MD [facility] 04/25/2018, 9:04 AM

## 2018-04-26 ENCOUNTER — Ambulatory Visit (HOSPITAL_COMMUNITY)
Admission: RE | Admit: 2018-04-26 | Discharge: 2018-04-26 | Disposition: A | Discharge status: Home / Self Care | Payer: Medicare HMO | Source: Ambulatory Visit | Attending: Internal Medicine | Admitting: Internal Medicine

## 2018-04-26 DIAGNOSIS — I700 Atherosclerosis of aorta: Secondary | ICD-10-CM | POA: Diagnosis not present

## 2018-04-26 DIAGNOSIS — R911 Solitary pulmonary nodule: Secondary | ICD-10-CM | POA: Diagnosis not present

## 2018-04-26 DIAGNOSIS — C7951 Secondary malignant neoplasm of bone: Secondary | ICD-10-CM | POA: Diagnosis not present

## 2018-04-26 DIAGNOSIS — Z8546 Personal history of malignant neoplasm of prostate: Secondary | ICD-10-CM | POA: Diagnosis not present

## 2018-04-26 DIAGNOSIS — C61 Malignant neoplasm of prostate: Secondary | ICD-10-CM | POA: Diagnosis not present

## 2018-04-26 MED ORDER — IOPAMIDOL (ISOVUE-300) INJECTION 61%
100.0000 mL | Freq: Once | INTRAVENOUS | Status: AC | PRN
  Administered 2018-04-26: 100 mL via INTRAVENOUS

## 2018-04-27 ENCOUNTER — Inpatient Hospital Stay (HOSPITAL_COMMUNITY): Payer: Medicare HMO

## 2018-04-27 ENCOUNTER — Other Ambulatory Visit: Payer: Self-pay

## 2018-04-27 ENCOUNTER — Encounter (HOSPITAL_COMMUNITY): Payer: Self-pay | Admitting: Internal Medicine

## 2018-04-27 ENCOUNTER — Encounter (HOSPITAL_COMMUNITY)
Admission: RE | Admit: 2018-04-27 | Discharge: 2018-04-27 | Disposition: A | Discharge status: Home / Self Care | Payer: Medicare HMO | Source: Ambulatory Visit | Attending: Internal Medicine | Admitting: Internal Medicine

## 2018-04-27 ENCOUNTER — Inpatient Hospital Stay (HOSPITAL_BASED_OUTPATIENT_CLINIC_OR_DEPARTMENT_OTHER): Payer: Medicare HMO | Admitting: Internal Medicine

## 2018-04-27 VITALS — BP 125/68 | HR 80 | Temp 98.4°F | Resp 16 | Wt 205.2 lb

## 2018-04-27 DIAGNOSIS — C61 Malignant neoplasm of prostate: Secondary | ICD-10-CM

## 2018-04-27 DIAGNOSIS — I700 Atherosclerosis of aorta: Secondary | ICD-10-CM

## 2018-04-27 DIAGNOSIS — M8580 Other specified disorders of bone density and structure, unspecified site: Secondary | ICD-10-CM

## 2018-04-27 DIAGNOSIS — K219 Gastro-esophageal reflux disease without esophagitis: Secondary | ICD-10-CM

## 2018-04-27 DIAGNOSIS — R110 Nausea: Secondary | ICD-10-CM | POA: Diagnosis not present

## 2018-04-27 DIAGNOSIS — E7800 Pure hypercholesterolemia, unspecified: Secondary | ICD-10-CM

## 2018-04-27 DIAGNOSIS — Y842 Radiological procedure and radiotherapy as the cause of abnormal reaction of the patient, or of later complication, without mention of misadventure at the time of the procedure: Secondary | ICD-10-CM

## 2018-04-27 DIAGNOSIS — Z79899 Other long term (current) drug therapy: Secondary | ICD-10-CM

## 2018-04-27 DIAGNOSIS — R350 Frequency of micturition: Secondary | ICD-10-CM | POA: Diagnosis not present

## 2018-04-27 DIAGNOSIS — C7951 Secondary malignant neoplasm of bone: Secondary | ICD-10-CM

## 2018-04-27 DIAGNOSIS — I10 Essential (primary) hypertension: Secondary | ICD-10-CM

## 2018-04-27 DIAGNOSIS — Z79818 Long term (current) use of other agents affecting estrogen receptors and estrogen levels: Secondary | ICD-10-CM | POA: Diagnosis not present

## 2018-04-27 DIAGNOSIS — Z87891 Personal history of nicotine dependence: Secondary | ICD-10-CM

## 2018-04-27 LAB — CBC WITH DIFFERENTIAL/PLATELET
BASOS PCT: 0 %
Basophils Absolute: 0 10*3/uL (ref 0.0–0.1)
EOS ABS: 0.2 10*3/uL (ref 0.0–0.7)
EOS PCT: 3 %
HCT: 31.2 % — ABNORMAL LOW (ref 39.0–52.0)
HEMOGLOBIN: 10.3 g/dL — AB (ref 13.0–17.0)
Lymphocytes Relative: 31 %
Lymphs Abs: 2.2 10*3/uL (ref 0.7–4.0)
MCH: 30.4 pg (ref 26.0–34.0)
MCHC: 33 g/dL (ref 30.0–36.0)
MCV: 92 fL (ref 78.0–100.0)
MONOS PCT: 8 %
Monocytes Absolute: 0.5 10*3/uL (ref 0.1–1.0)
Neutro Abs: 4 10*3/uL (ref 1.7–7.7)
Neutrophils Relative %: 58 %
PLATELETS: 226 10*3/uL (ref 150–400)
RBC: 3.39 MIL/uL — ABNORMAL LOW (ref 4.22–5.81)
RDW: 14.9 % (ref 11.5–15.5)
WBC: 6.9 10*3/uL (ref 4.0–10.5)

## 2018-04-27 LAB — COMPREHENSIVE METABOLIC PANEL
ALBUMIN: 3.7 g/dL (ref 3.5–5.0)
ALT: 16 U/L — ABNORMAL LOW (ref 17–63)
AST: 21 U/L (ref 15–41)
Alkaline Phosphatase: 195 U/L — ABNORMAL HIGH (ref 38–126)
Anion gap: 7 (ref 5–15)
BUN: 23 mg/dL — ABNORMAL HIGH (ref 6–20)
CHLORIDE: 106 mmol/L (ref 101–111)
CO2: 24 mmol/L (ref 22–32)
Calcium: 8.3 mg/dL — ABNORMAL LOW (ref 8.9–10.3)
Creatinine, Ser: 0.82 mg/dL (ref 0.61–1.24)
GFR calc non Af Amer: >60 mL/min (ref >60)
GLUCOSE: 132 mg/dL — AB (ref 65–99)
POTASSIUM: 4.3 mmol/L (ref 3.5–5.1)
SODIUM: 137 mmol/L (ref 135–145)
Total Bilirubin: 0.5 mg/dL (ref 0.3–1.2)
Total Protein: 7 g/dL (ref 6.5–8.1)

## 2018-04-27 MED ORDER — TECHNETIUM TC 99M MEDRONATE IV KIT
18.0000 | PACK | Freq: Once | INTRAVENOUS | Status: AC | PRN
  Administered 2018-04-27: 18 via INTRAVENOUS

## 2018-04-27 MED ORDER — PROMETHAZINE HCL 25 MG PO TABS: 25.0000 mg | ORAL_TABLET | ORAL | 0 refills | Status: DC | PRN

## 2018-04-27 MED ORDER — PREDNISONE 5 MG PO TABS: 5.0000 mg | ORAL_TABLET | Freq: Two times a day (BID) | ORAL | 5 refills | Status: DC

## 2018-04-27 NOTE — Progress Notes (Signed)
Diagnosis Prostate cancer ([place]) - Plan: CBC with Differential/Platelet, Comprehensive metabolic panel, Lactate dehydrogenase, Testosterone, PSA  Staging Cancer Staging Primary prostate cancer with metastasis from prostate to other site [facility]) Staging form: Prostate, AJCC 7th Edition - Clinical: Stage IIC (pT2c, N0, M0) - Signed by Baird Cancer, PA on 06/20/2011   Assessment and Plan:  1.  Stage IIC biochemically recurrent prostate cancer, now with metastatic disease to his bone and pelvic lymph node:  -Found to have biochemical recurrence in 2012 and was started on [name] at that time. Casodex was added in 2015. His PSA began to rise and Casodex was dicontinued in 05/2016 to try to drive PSA back down. However, PSA continued to climb at that time. Therefore, Casodex was resumed in 09/2016 to try to re-challenge response.  Testosterone levels have been in castrate range.  He has continued on [name] injections every 3 months.     -PSA trends were declining and have begun to rise again.  PSA as follows: 03/14/2018:  67.59 02/14/2018:  68.34 01/17/2018:  67.49 01/01/2018: 52.04 12/20/2017: 29.84 11/22/2017: 17.45 11/08/2017: 10.64  Patient was started on Zytiga and prednisone.  Casodex has been discontinued.    Most recent CT imaging due to PSA doubling time was on 11/16/2017 indicating new enlarged right pelvic sidewall lymph node and borderline left periaortic lymph node which were worrisome for metastatic adenopathy.  Bone scan from 11/13/2017 demonstrated progression of bone metastases.   Zytiga and prednisone started in 12/2017. Testosterone level was < 3 in 12/2017  shows that patient was at castrate levels.  He was on [name] every 3 months. PSA elevated at 68.  He was recommended for CT CAP and bone scan in May 2019.    CT CAP done 04/26/2018 showed  IMPRESSION: 1. Findings are compatible with progressive metastatic disease to the bones, as discussed above. 2. No extraskeletal metastatic  disease noted elsewhere in the chest, abdomen or pelvis. 3. Aortic atherosclerosis. 4. Additional incidental findings, as above.  Bone scan done 04/27/2018 showed  IMPRESSION: Progressive osseous metastatic disease since previous exam.  The pt reports today he has stopped taking Zytiga due to problems with nausea.  I have discussed  With him PSA continues to increase and CT scan and bone scan show evidence of progression.  He is  Recommended to resume Zytiga and prednisone as directed.  He reports today he is scheduled to go out Of town for 6-8 weeks.  I have discussed with him if PSA continues to rise, he will have to consider options of  Chemotherapy with Taxotere.  He reports he will not take chemotherapy.  He will RTC in 8 weeks and will repeat PSA and Testosterone levels at that time.  Rx for prednisone sent to pharmacy.  Pt reports he will resume medications.    2.  Bone mets.  Pt is showing evidence of progressive disease.  He is advised to take Calcium and vitamin D.  Will resume Xgeva on RTC to allow improvement in calcium level.    3.  Nausea.  He is given a prescription for Phenergan 25 mg po q 4 hr prn.    4.  Urinary frequency/Incomplete bladder emptying felt due to radiation therapy.  Continue Flomax as prescribed.  Continue follow-up with urology.    5.  Hypocalcemia.  He is advised to take calcium and vitamin D as previously directed.  Will hold Delton See today as he reports he will be out of town for an extended period.  Current status: Pt is seen today for follow-up to go over scans and labs.  He reports he stopped Zytiga on his own for over a month.  He reports he is going out of town for 8 weeks.     Prostate cancer ([place])   06/20/2011 Initial Diagnosis    Prostate cancer ([place])      01/12/2016 Imaging    Bone density- This patient is considered osteopenic by World Healh Organization (WHO) Criteria.       Primary prostate cancer with metastasis from prostate to  other site [facility])   06/20/2011 Initial Diagnosis    Prostate cancer ([place])      01/12/2016 Imaging    Bone density- This patient is considered osteopenic by World Healh Organization (WHO) Criteria.       03/16/2016 Imaging    CT CAP- No evidence of metastatic disease within the chest, abdomen, or pelvis. No other acute findings identified.      06/17/2016 Imaging    Bone scan- 1. Negative for metastatic pattern.      07/11/2016 Miscellaneous    Prolia every 6 months for osteopenia.      09/12/2016 -  Chemotherapy    Casodex/Depo-[name] every 3 months       09/27/2016 Imaging    CT CAP -No acute findings and no evidence for metastatic disease within the chest, abdomen or pelvis.      11/13/2017 Progression    Bone Scan: Multiple new abnormal sites of increased osseous tracer accumulation are identified as above consistent with osseous metastatic disease.  These include new foci of uptake at the proximal femora bilaterally.      11/16/2017 Progression    CT CAP-IMPRESSION: 1. New enlarged right pelvic sidewall lymph node and borderline left periaortic lymph node worrisome for metastatic adenopathy. 2. Subtle areas of sclerosis within the lumbar spine are more conspicuous than on the previous exam and worrisome for metastatic disease. Overall the areas of bone metastasis are more better demonstrated on bone scan from 11/13/2017 which demonstrate a progression of bone metastasis.        Problem List Patient Active Problem List   Diagnosis Date Noted  . Chronic right-sided low back pain with right-sided sciatica [M54.41, G89.29] 07/06/2017  . GERD (gastroesophageal reflux disease) [K21.9] 11/22/2016  . Knee pain, left [M25.562] 02/11/2016  . Prediabetes [R73.03] 01/27/2016  . Osteopenia determined by x-ray [M85.80] 02/01/2015  . Hypertension [I10]   . Hyperlipidemia [E78.5]   . Gynecomastia, male [N62] 01/11/2013  . Primary prostate cancer with metastasis from prostate  to other site [facility]) [C61] 06/20/2011  . H N P-CERVICAL [M50.20] 10/05/2009  . CERVICAL SPASM [S13.9XXA] 10/05/2009    Past Medical History Past Medical History:  Diagnosis Date  . Acid reflux   . Alcoholism ([place])   . Arthritis   . Fatty liver   . Gynecomastia, male 01/11/2013   Secondary to prostate ca Tx.   . H/O Bell's palsy   . Hemorrhoids    history  . High cholesterol   . History of back injury 11/19/2013  . Hyperglycemia   . Hyperlipidemia   . Hypertension   . Prostate cancer ([place])   . Prostate cancer ([place]) 06/20/2011  . Prostate carcinoma, recurrent ([place]) 08/10/2012    Past Surgical History Past Surgical History:  Procedure Laterality Date  . [place]   left  . COLONOSCOPY  2008  . ESOPHAGOGASTRODUODENOSCOPY    . HERNIA REPAIR  2010  . I&D EXTREMITY  Left 06/10/2015   Procedure: IRRIGATION AND DEBRIDEMENT EXTREMITY LEFT HAND EXPLORATION, nerve repair;  Surgeon: Charlotte Crumb, MD;  Location: [place];  Service: Orthopedics;  Laterality: Left;  . KNEE SURGERY     left  . PROSTATE BIOPSY  11/06    Family History Family History  Problem Relation Age of Onset  . Heart failure Mother   . Diabetes Father   . Cancer Sister   . Diabetes Brother      Social History  reports that he has quit smoking. He has a 7.50 pack-year smoking history. He has never used smokeless tobacco. He reports that he does not drink alcohol or use drugs.  Medications  Current Outpatient Medications:  .  amLODipine (NORVASC) 10 MG tablet, Take 1 tablet (10 mg total) by mouth daily., Disp: 90 tablet, Rfl: 3 .  Calcium-Magnesium-Vitamin D (CALCIUM 1200+D3 PO), Take 3 tablets by mouth daily. , Disp: , Rfl:  .  Denosumab (XGEVA [place]), Inject into the skin every 30 (thirty) days. , Disp: , Rfl:  .  ibuprofen (ADVIL,MOTRIN) 800 MG tablet, Take 1 tablet (800 mg total) by mouth every 8 (eight) hours as needed., Disp: 270 tablet, Rfl: 3 .  Leuprolide Acetate ([name] IJ), Inject as directed  every 3 (three) months., Disp: , Rfl:  .  tamsulosin (FLOMAX) 0.4 MG CAPS capsule, Take 1 capsule (0.4 mg total) by mouth 2 (two) times daily., Disp: 180 capsule, Rfl: 3 .  traMADol (ULTRAM) 50 MG tablet, Take 1 tablet (50 mg total) by mouth every 6 (six) hours as needed., Disp: 120 tablet, Rfl: 0 .  abiraterone acetate (ZYTIGA) 250 MG tablet, Take 4 tablets (1,000 mg total) by mouth daily. Take on an empty stomach 1 hour before or 2 hours after a meal (Patient not taking: Reported on 04/25/2018), Disp: 120 tablet, Rfl: 0 .  predniSONE (DELTASONE) 5 MG tablet, Take 1 tablet (5 mg total) by mouth 2 (two) times daily with a meal., Disp: 60 tablet, Rfl: 5 .  promethazine (PHENERGAN) 25 MG tablet, Take 1 tablet (25 mg total) by mouth every 4 (four) hours as needed for nausea or vomiting., Disp: 60 tablet, Rfl: 0  Allergies Patient has no known allergies.  Review of Systems Review of Systems - Oncology ROS as per HPI otherwise 12 point ROS is negative.   Physical Exam  Vitals Wt Readings from Last 3 Encounters:  04/27/18 205 lb 3.2 oz (93.1 kg)  04/25/18 202 lb (91.6 kg)  02/26/18 205 lb (93 kg)   Temp Readings from Last 3 Encounters:  04/27/18 98.4 F (36.9 C) (Oral)  04/25/18 (!) 97.2 F (36.2 C) (Oral)  03/14/18 98.7 F (37.1 C) (Oral)   BP Readings from Last 3 Encounters:  04/27/18 125/68  04/25/18 127/81  03/14/18 131/67   Pulse Readings from Last 3 Encounters:  04/27/18 80  04/25/18 80  03/14/18 83   Constitutional: Well-developed, well-nourished, and in no distress.   HENT: Head: Normocephalic and atraumatic.  Mouth/Throat: No oropharyngeal exudate. Mucosa moist. Eyes: Pupils are equal, round, and reactive to light. Conjunctivae are normal. No scleral icterus.  Neck: Normal range of motion. Neck supple. No JVD present.  Cardiovascular: Normal rate, regular rhythm and normal heart sounds.  Exam reveals no gallop and no friction rub.   No murmur heard. Pulmonary/Chest:  Effort normal and breath sounds normal. No respiratory distress. No wheezes.No rales.  Abdominal: Soft. Bowel sounds are normal. No distension. There is no tenderness. There is no guarding.  Musculoskeletal: No edema or tenderness.  Lymphadenopathy: No cervical. Axillary or supraclavicular adenopathy.  Neurological: Alert and oriented to person, place, and time. No cranial nerve deficit.  Skin: Skin is warm and dry. No rash noted. No erythema. No pallor.  Psychiatric: Affect and judgment normal.   Labs Appointment on 04/27/2018  Component Date Value Ref Range Status  . WBC 04/27/2018 6.9  4.0 - 10.5 K/uL Final  . RBC 04/27/2018 3.39* 4.22 - 5.81 MIL/uL Final  . Hemoglobin 04/27/2018 10.3* 13.0 - 17.0 g/dL Final  . HCT 04/27/2018 31.2* 39.0 - 52.0 % Final  . MCV 04/27/2018 92.0  78.0 - 100.0 fL Final  . MCH 04/27/2018 30.4  26.0 - 34.0 pg Final  . MCHC 04/27/2018 33.0  30.0 - 36.0 g/dL Final  . RDW 04/27/2018 14.9  11.5 - 15.5 % Final  . Platelets 04/27/2018 226  150 - 400 K/uL Final  . Neutrophils Relative % 04/27/2018 58  % Final  . Neutro Abs 04/27/2018 4.0  1.7 - 7.7 K/uL Final  . Lymphocytes Relative 04/27/2018 31  % Final  . Lymphs Abs 04/27/2018 2.2  0.7 - 4.0 K/uL Final  . Monocytes Relative 04/27/2018 8  % Final  . Monocytes Absolute 04/27/2018 0.5  0.1 - 1.0 K/uL Final  . Eosinophils Relative 04/27/2018 3  % Final  . Eosinophils Absolute 04/27/2018 0.2  0.0 - 0.7 K/uL Final  . Basophils Relative 04/27/2018 0  % Final  . Basophils Absolute 04/27/2018 0.0  0.0 - 0.1 K/uL Final   Performed at [facility], [address], [place], [place] [address]  . Sodium 04/27/2018 137  135 - 145 mmol/L Final  . Potassium 04/27/2018 4.3  3.5 - 5.1 mmol/L Final  . Chloride 04/27/2018 106  101 - 111 mmol/L Final  . CO2 04/27/2018 24  22 - 32 mmol/L Final  . Glucose, Bld 04/27/2018 132* 65 - 99 mg/dL Final  . BUN 04/27/2018 23* 6 - 20 mg/dL Final  . Creatinine, Ser 04/27/2018 0.82  0.61 -  1.24 mg/dL Final  . Calcium 04/27/2018 8.3* 8.9 - 10.3 mg/dL Final  . Total Protein 04/27/2018 7.0  6.5 - 8.1 g/dL Final  . Albumin 04/27/2018 3.7  3.5 - 5.0 g/dL Final  . AST 04/27/2018 21  15 - 41 U/L Final  . ALT 04/27/2018 16* 17 - 63 U/L Final  . Alkaline Phosphatase 04/27/2018 195* 38 - 126 U/L Final  . Total Bilirubin 04/27/2018 0.5  0.3 - 1.2 mg/dL Final  . GFR calc non Af Amer 04/27/2018 >60  >60 mL/min Final  . GFR calc Af Amer 04/27/2018 >60  >60 mL/min Final   Comment: (NOTE) The eGFR has been calculated using the CKD EPI equation. This calculation has not been validated in all clinical situations. eGFR's persistently <60 mL/min signify possible Chronic Kidney Disease.   Georgiann Hahn gap 04/27/2018 7  5 - 15 Final   Performed at [facility], [address], [place], [place] [address]  Office Visit on 04/25/2018  Component Date Value Ref Range Status  . Bayer DCA Hb A1c Waived 04/25/2018 6.1  <7.0 % Final   Comment:                                       Diabetic Adult            <7.0  Healthy Adult        4.3 - 5.7                                                           (DCCT/NGSP) American Diabetes Association's Summary of Glycemic Recommendations for Adults with Diabetes: Hemoglobin A1c <7.0%. More stringent glycemic goals (A1c <6.0%) may further reduce complications at the cost of increased risk of hypoglycemia.      Pathology Orders Placed This Encounter  Procedures  . CBC with Differential/Platelet    Standing Status:   Future    Standing Expiration Date:   04/28/2019  . Comprehensive metabolic panel    Standing Status:   Future    Standing Expiration Date:   04/28/2019  . Lactate dehydrogenase    Standing Status:   Future    Standing Expiration Date:   04/28/2019  . Testosterone    Standing Status:   Future    Standing Expiration Date:   04/28/2019  . PSA    Standing Status:   Future    Standing Expiration Date:    04/28/2019       Zoila Shutter MD

## 2018-04-27 NOTE — Progress Notes (Signed)
Corrected calcium 8.54 . Will hold xgeva today per Dr. Walden Field.

## 2018-05-08 ENCOUNTER — Other Ambulatory Visit (HOSPITAL_COMMUNITY): Payer: Self-pay

## 2018-05-08 DIAGNOSIS — C61 Malignant neoplasm of prostate: Secondary | ICD-10-CM

## 2018-05-08 MED ORDER — ABIRATERONE ACETATE 250 MG PO TABS: 1000.0000 mg | ORAL_TABLET | Freq: Every day | ORAL | 0 refills | Status: DC

## 2018-05-08 NOTE — Telephone Encounter (Signed)
Received refill request from patients pharmacy for [place]. Reviewed with provider, chart checked and refilled.

## 2018-05-09 ENCOUNTER — Ambulatory Visit (HOSPITAL_COMMUNITY): Payer: Self-pay | Admitting: Hematology

## 2018-05-09 ENCOUNTER — Ambulatory Visit (HOSPITAL_COMMUNITY): Payer: Self-pay

## 2018-05-09 ENCOUNTER — Other Ambulatory Visit (HOSPITAL_COMMUNITY): Payer: Self-pay

## 2018-05-15 ENCOUNTER — Ambulatory Visit: Payer: Self-pay | Admitting: Urology

## 2018-05-28 ENCOUNTER — Other Ambulatory Visit (HOSPITAL_COMMUNITY): Payer: Self-pay

## 2018-05-28 DIAGNOSIS — C61 Malignant neoplasm of prostate: Secondary | ICD-10-CM

## 2018-05-28 MED ORDER — ABIRATERONE ACETATE 250 MG PO TABS: 1000.0000 mg | ORAL_TABLET | Freq: Every day | ORAL | 0 refills | Status: DC

## 2018-05-28 NOTE — Telephone Encounter (Signed)
Received refill request from patients pharmacy for Walter Boone. Reviewed with provider, chart checked and refilled.

## 2018-05-30 ENCOUNTER — Ambulatory Visit (INDEPENDENT_AMBULATORY_CARE_PROVIDER_SITE_OTHER): Payer: Medicare HMO | Admitting: Family Medicine

## 2018-05-30 ENCOUNTER — Encounter: Payer: Self-pay | Admitting: Family Medicine

## 2018-05-30 VITALS — BP 117/72 | HR 89 | Temp 97.8°F | Ht 66.0 in | Wt 203.0 lb

## 2018-05-30 DIAGNOSIS — E669 Obesity, unspecified: Secondary | ICD-10-CM | POA: Diagnosis not present

## 2018-05-30 DIAGNOSIS — I10 Essential (primary) hypertension: Secondary | ICD-10-CM

## 2018-05-30 DIAGNOSIS — R7303 Prediabetes: Secondary | ICD-10-CM

## 2018-05-30 DIAGNOSIS — E782 Mixed hyperlipidemia: Secondary | ICD-10-CM | POA: Diagnosis not present

## 2018-05-30 DIAGNOSIS — K5900 Constipation, unspecified: Secondary | ICD-10-CM | POA: Diagnosis not present

## 2018-05-30 DIAGNOSIS — M5441 Lumbago with sciatica, right side: Secondary | ICD-10-CM

## 2018-05-30 DIAGNOSIS — G8929 Other chronic pain: Secondary | ICD-10-CM

## 2018-05-30 DIAGNOSIS — K2100 Gastro-esophageal reflux disease with esophagitis, without bleeding: Secondary | ICD-10-CM

## 2018-05-30 LAB — BAYER DCA HB A1C WAIVED: HB A1C: 6.1 % (ref <7.0)

## 2018-05-30 MED ORDER — TRAMADOL HCL 50 MG PO TABS: 50.0000 mg | ORAL_TABLET | Freq: Four times a day (QID) | ORAL | 0 refills | Status: DC | PRN

## 2018-05-30 MED ORDER — DOCUSATE SODIUM 50 MG/5ML PO LIQD: 100.0000 mg | Freq: Two times a day (BID) | ORAL | 2 refills | Status: DC

## 2018-05-30 NOTE — Progress Notes (Signed)
BP 117/72   Pulse 89   Temp 97.8 F (36.6 C) (Oral)   Ht _0  (1.676 m)   Wt 203 lb (92.1 kg)   BMI 32.77 kg/m    Subjective:    [name], male    DOB: 1951/01/30, 67 y.o.   MRN: [id number]  HPI: Walter Boone is a 67 y.o. male presenting on 05/30/2018 for Hyperlipidemia and Hypertension   HPI Hyperlipidemia Patient is coming in for recheck of his hyperlipidemia. The patient is currently taking no medication currently, has stage III-IV prostate cancer with his treatment resistant. They deny any issues with myalgias or history of liver damage from it. They deny any focal numbness or weakness or chest pain.   Hypertension Patient is currently on amlodipine, and their blood pressure today is 117/72. Patient denies any lightheadedness or dizziness. Patient denies headaches, blurred vision, chest pains, shortness of breath, or weakness. Denies any side effects from medication and is content with current medication.   Prediabetes Patient comes in today for recheck of his diabetes. Patient has been currently taking no medication but is on prednisone for his cancer and that is likely why he has prediabetes, will recheck today. Patient is not currently on an ACE inhibitor/ARB. Patient has not seen an ophthalmologist this year. Patient denies any issues with their feet.  GERD Patient is currently on Phenergan as needed.  She denies any major symptoms or abdominal pain or belching or burping. She denies any blood in her stool or lightheadedness or dizziness.   Low back pain and metastasis of cancer pain Patient has chronic low back pain and now pain throughout the bones of his body from the metastasis of prostate cancer throughout his body and even into his skull.  He is taking tramadol for that and says that it is controlling the pain sufficiently.  He does get some constipation from it and wants to know if there is something that could help with that.  Relevant  past medical, surgical, family and social history reviewed and updated as indicated. Interim medical history since our last visit reviewed. Allergies and medications reviewed and updated.  Review of Systems  Constitutional: Negative for chills and fever.  Respiratory: Negative for shortness of breath and wheezing.   Cardiovascular: Negative for chest pain and leg swelling.  Gastrointestinal: Positive for constipation. Negative for abdominal pain.  Musculoskeletal: Positive for arthralgias and back pain. Negative for gait problem.  Skin: Negative for rash.  All other systems reviewed and are negative.   Per HPI unless specifically indicated above   Allergies as of 05/30/2018   No Known Allergies     Medication List        Accurate as of 05/30/18  3:58 PM. Always use your most recent med list.          abiraterone acetate 250 MG tablet Commonly known as:  ZYTIGA Take 4 tablets (1,000 mg total) by mouth daily. Take on an empty stomach 1 hour before or 2 hours after a meal   amLODipine 10 MG tablet Commonly known as:  NORVASC Take 1 tablet (10 mg total) by mouth daily.   CALCIUM 1200+D3 PO Take 3 tablets by mouth daily.   docusate 50 MG/5ML liquid Commonly known as:  COLACE Take 10 mLs (100 mg total) by mouth 2 (two) times daily.   ibuprofen 800 MG tablet Commonly known as:  ADVIL,MOTRIN Take 1 tablet (800 mg total) by mouth every 8 (eight) hours  as needed.   [name] IJ Inject as directed every 3 (three) months.   promethazine 25 MG tablet Commonly known as:  PHENERGAN Take 1 tablet (25 mg total) by mouth every 4 (four) hours as needed for nausea or vomiting.   tamsulosin 0.4 MG Caps capsule Commonly known as:  FLOMAX Take 1 capsule (0.4 mg total) by mouth 2 (two) times daily.   traMADol 50 MG tablet Commonly known as:  ULTRAM Take 1 tablet (50 mg total) by mouth every 6 (six) hours as needed.   XGEVA [place] Inject into the skin every 30 (thirty) days.           Objective:    BP 117/72   Pulse 89   Temp 97.8 F (36.6 C) (Oral)   Ht _0  (1.676 m)   Wt 203 lb (92.1 kg)   BMI 32.77 kg/m   Wt Readings from Last 3 Encounters:  05/30/18 203 lb (92.1 kg)  04/27/18 205 lb 3.2 oz (93.1 kg)  04/25/18 202 lb (91.6 kg)    Physical Exam  Constitutional: He is oriented to person, place, and time. He appears well-developed and well-nourished. No distress.  Eyes: Pupils are equal, round, and reactive to light. Conjunctivae and EOM are normal. Right eye exhibits no discharge. No scleral icterus.  Neck: Neck supple. No thyromegaly present.  Cardiovascular: Normal rate, regular rhythm, normal heart sounds and intact distal pulses.  No murmur heard. Pulmonary/Chest: Effort normal and breath sounds normal. No respiratory distress. He has no wheezes.  Abdominal: Soft. Bowel sounds are normal. He exhibits no distension. There is no tenderness.  Musculoskeletal: Normal range of motion. He exhibits tenderness (Low back pain and other arthralgias diffusely, nontender to palpation in other places, likely bone pain in deeper.). He exhibits no edema.  Lymphadenopathy:    He has no cervical adenopathy.  Neurological: He is alert and oriented to person, place, and time. Coordination normal.  Skin: Skin is warm and dry. No rash noted. He is not diaphoretic.  Psychiatric: He has a normal mood and affect. His behavior is normal.  Nursing note and vitals reviewed.       Assessment & Plan:   Problem List Items Addressed This Visit      Cardiovascular and Mediastinum   Hypertension   Relevant Orders   CMP14+EGFR     Digestive   GERD (gastroesophageal reflux disease)   Relevant Medications   docusate (COLACE) 50 MG/5ML liquid     Nervous and Auditory   Chronic right-sided low back pain with right-sided sciatica   Relevant Medications   traMADol (ULTRAM) 50 MG tablet     Other   Hyperlipidemia   Relevant Orders   Lipid panel   Prediabetes - Primary    Relevant Orders   Bayer DCA Hb A1c Waived   Obesity (BMI 30.0-34.9)    Other Visit Diagnoses    Constipation, unspecified constipation type       Relevant Medications   docusate (COLACE) 50 MG/5ML liquid       Follow up plan: Return in about 3 months (around 08/30/2018), or if symptoms worsen or fail to improve, for Pain recheck.  Counseling provided for all of the vaccine components Orders Placed This Encounter  Procedures  . Bayer DCA Hb A1c Waived  . CMP14+EGFR  . Lipid panel    Caryl Pina, MD [facility] 05/30/2018, 3:58 PM

## 2018-05-31 LAB — CMP14+EGFR
ALT: 13 IU/L (ref 0–44)
AST: 17 IU/L (ref 0–40)
Albumin/Globulin Ratio: 1.7 (ref 1.2–2.2)
Albumin: 4.1 g/dL (ref 3.6–4.8)
Alkaline Phosphatase: 161 IU/L — ABNORMAL HIGH (ref 39–117)
BUN/Creatinine Ratio: 25 — ABNORMAL HIGH (ref 10–24)
BUN: 26 mg/dL (ref 8–27)
Bilirubin Total: 0.2 mg/dL (ref 0.0–1.2)
CALCIUM: 8.5 mg/dL — AB (ref 8.6–10.2)
CO2: 21 mmol/L (ref 20–29)
Chloride: 107 mmol/L — ABNORMAL HIGH (ref 96–106)
Creatinine, Ser: 1.03 mg/dL (ref 0.76–1.27)
GFR calc Af Amer: 88 mL/min/{1.73_m2} (ref >59)
GFR calc non Af Amer: 76 mL/min/{1.73_m2} (ref >59)
GLOBULIN, TOTAL: 2.4 g/dL (ref 1.5–4.5)
Glucose: 125 mg/dL — ABNORMAL HIGH (ref 65–99)
Potassium: 4.1 mmol/L (ref 3.5–5.2)
SODIUM: 144 mmol/L (ref 134–144)
Total Protein: 6.5 g/dL (ref 6.0–8.5)

## 2018-05-31 LAB — LIPID PANEL
Chol/HDL Ratio: 4.7 ratio (ref 0.0–5.0)
Cholesterol, Total: 217 mg/dL — ABNORMAL HIGH (ref 100–199)
HDL: 46 mg/dL (ref >39)
LDL CALC: 123 mg/dL — AB (ref 0–99)
TRIGLYCERIDES: 239 mg/dL — AB (ref 0–149)
VLDL Cholesterol Cal: 48 mg/dL — ABNORMAL HIGH (ref 5–40)

## 2018-06-13 ENCOUNTER — Encounter: Payer: Self-pay | Admitting: Physician Assistant

## 2018-06-13 ENCOUNTER — Ambulatory Visit (INDEPENDENT_AMBULATORY_CARE_PROVIDER_SITE_OTHER): Payer: Medicare HMO | Admitting: Physician Assistant

## 2018-06-13 VITALS — BP 95/51 | HR 79 | Temp 97.0°F | Ht 66.0 in | Wt 199.4 lb

## 2018-06-13 DIAGNOSIS — H9209 Otalgia, unspecified ear: Secondary | ICD-10-CM

## 2018-06-13 DIAGNOSIS — K047 Periapical abscess without sinus: Secondary | ICD-10-CM

## 2018-06-13 DIAGNOSIS — R6884 Jaw pain: Secondary | ICD-10-CM | POA: Diagnosis not present

## 2018-06-13 MED ORDER — CEPHALEXIN 500 MG PO CAPS: 500.0000 mg | ORAL_CAPSULE | Freq: Two times a day (BID) | ORAL | 0 refills | Status: DC

## 2018-06-13 NOTE — Progress Notes (Signed)
  Subjective:     [name], male   DOB: 11-26-1951, 67 y.o.   MRN: [id number]  HPI R side jaw and ear pain for several days Sx better this am  Review of Systems  Constitutional: Negative for activity change, appetite change, chills, fatigue and fever.  HENT: Positive for dental problem and ear pain. Negative for congestion, ear discharge, facial swelling, hearing loss, mouth sores, nosebleeds, postnasal drip, rhinorrhea, sinus pressure, sinus pain, sneezing, sore throat, tinnitus and voice change.   Eyes: Negative.   Respiratory: Negative.   Cardiovascular: Negative.        Objective:   Physical Exam  Constitutional: He appears well-developed and well-nourished. No distress.  HENT:  Right Ear: External ear normal.  Left Ear: External ear normal.  Mouth/Throat: Oropharynx is clear and moist. No oropharyngeal exudate.  Caries noted to bottom R last molar TM's and canals normal bilat  Neck: Neck supple.  Large posterior lipoma palp  Lymphadenopathy:    He has no cervical adenopathy.  Skin: He is not diaphoretic.  Nursing note and vitals reviewed.      Assessment:     1. Tooth abscess        Plan:     Salt water gargles Keflex 500mg  bid x 10 days Appt with dentist OTC meds for sx F/U prn

## 2018-06-13 NOTE — Patient Instructions (Signed)
Dental Abscess A dental abscess is pus in or around a tooth. Follow these instructions at home:  Take medicines only as told by your dentist.  If you were prescribed antibiotic medicine, finish all of it even if you start to feel better.  Rinse your mouth (gargle) often with salt water.  Do not drive or use heavy machinery, like a lawn mower, while taking pain medicine.  Do not apply heat to the outside of your mouth.  Keep all follow-up visits as told by your dentist. This is important. Contact a doctor if:  Your pain is worse, and medicine does not help. Get help right away if:  You have a fever or chills.  Your symptoms suddenly get worse.  You have a very bad headache.  You have problems breathing or swallowing.  You have trouble opening your mouth.  You have puffiness (swelling) in your neck or around your eye. This information is not intended to replace advice given to you by your health care provider. Make sure you discuss any questions you have with your health care provider. Document Released: 04/14/2015 Document Revised: 05/05/2016 Document Reviewed: 11/25/2014 Elsevier Interactive Patient Education  2018 Elsevier Inc.  

## 2018-06-15 ENCOUNTER — Encounter (HOSPITAL_COMMUNITY): Payer: Self-pay | Admitting: *Deleted

## 2018-06-18 ENCOUNTER — Other Ambulatory Visit (HOSPITAL_COMMUNITY): Payer: Self-pay | Admitting: *Deleted

## 2018-06-18 DIAGNOSIS — C61 Malignant neoplasm of prostate: Secondary | ICD-10-CM

## 2018-06-18 MED ORDER — ABIRATERONE ACETATE 250 MG PO TABS: 1000.0000 mg | ORAL_TABLET | Freq: Every day | ORAL | 0 refills | Status: DC

## 2018-06-18 NOTE — Telephone Encounter (Signed)
Chart reviewed, Zytiga refilled based on Dr. Walden Field last note.

## 2018-06-25 ENCOUNTER — Inpatient Hospital Stay (HOSPITAL_COMMUNITY): Payer: Medicare HMO | Attending: Hematology

## 2018-06-25 DIAGNOSIS — Z9221 Personal history of antineoplastic chemotherapy: Secondary | ICD-10-CM | POA: Insufficient documentation

## 2018-06-25 DIAGNOSIS — K0889 Other specified disorders of teeth and supporting structures: Secondary | ICD-10-CM | POA: Insufficient documentation

## 2018-06-25 DIAGNOSIS — F1020 Alcohol dependence, uncomplicated: Secondary | ICD-10-CM | POA: Insufficient documentation

## 2018-06-25 DIAGNOSIS — Z79899 Other long term (current) drug therapy: Secondary | ICD-10-CM | POA: Diagnosis not present

## 2018-06-25 DIAGNOSIS — K219 Gastro-esophageal reflux disease without esophagitis: Secondary | ICD-10-CM | POA: Diagnosis not present

## 2018-06-25 DIAGNOSIS — C7951 Secondary malignant neoplasm of bone: Secondary | ICD-10-CM | POA: Diagnosis not present

## 2018-06-25 DIAGNOSIS — C61 Malignant neoplasm of prostate: Secondary | ICD-10-CM | POA: Insufficient documentation

## 2018-06-25 DIAGNOSIS — Z87891 Personal history of nicotine dependence: Secondary | ICD-10-CM | POA: Insufficient documentation

## 2018-06-25 DIAGNOSIS — R42 Dizziness and giddiness: Secondary | ICD-10-CM | POA: Diagnosis not present

## 2018-06-25 DIAGNOSIS — Z79818 Long term (current) use of other agents affecting estrogen receptors and estrogen levels: Secondary | ICD-10-CM | POA: Diagnosis not present

## 2018-06-25 DIAGNOSIS — M8580 Other specified disorders of bone density and structure, unspecified site: Secondary | ICD-10-CM | POA: Diagnosis not present

## 2018-06-25 DIAGNOSIS — Z800 Family history of malignant neoplasm of digestive organs: Secondary | ICD-10-CM | POA: Insufficient documentation

## 2018-06-25 DIAGNOSIS — E7800 Pure hypercholesterolemia, unspecified: Secondary | ICD-10-CM | POA: Diagnosis not present

## 2018-06-25 DIAGNOSIS — I10 Essential (primary) hypertension: Secondary | ICD-10-CM | POA: Diagnosis not present

## 2018-06-25 DIAGNOSIS — Z923 Personal history of irradiation: Secondary | ICD-10-CM | POA: Insufficient documentation

## 2018-06-25 DIAGNOSIS — Z809 Family history of malignant neoplasm, unspecified: Secondary | ICD-10-CM | POA: Insufficient documentation

## 2018-06-25 LAB — CBC WITH DIFFERENTIAL/PLATELET
BASOS ABS: 0.1 10*3/uL (ref 0.0–0.1)
Basophils Relative: 2 %
Eosinophils Absolute: 0.2 10*3/uL (ref 0.0–0.7)
Eosinophils Relative: 3 %
HCT: 27.6 % — ABNORMAL LOW (ref 39.0–52.0)
HEMOGLOBIN: 8.8 g/dL — AB (ref 13.0–17.0)
LYMPHS PCT: 34 %
Lymphs Abs: 2 10*3/uL (ref 0.7–4.0)
MCH: 30.9 pg (ref 26.0–34.0)
MCHC: 31.9 g/dL (ref 30.0–36.0)
MCV: 96.8 fL (ref 78.0–100.0)
MONOS PCT: 7 %
Monocytes Absolute: 0.4 10*3/uL (ref 0.1–1.0)
NEUTROS PCT: 54 %
Neutro Abs: 3.2 10*3/uL (ref 1.7–7.7)
Platelets: 145 10*3/uL — ABNORMAL LOW (ref 150–400)
RBC: 2.85 MIL/uL — AB (ref 4.22–5.81)
RDW: 16.2 % — ABNORMAL HIGH (ref 11.5–15.5)
WBC: 5.9 10*3/uL (ref 4.0–10.5)

## 2018-06-25 LAB — COMPREHENSIVE METABOLIC PANEL
ALBUMIN: 3.8 g/dL (ref 3.5–5.0)
ALK PHOS: 104 U/L (ref 38–126)
ALT: 15 U/L (ref 0–44)
AST: 25 U/L (ref 15–41)
Anion gap: 11 (ref 5–15)
BUN: 21 mg/dL (ref 8–23)
CALCIUM: 8.8 mg/dL — AB (ref 8.9–10.3)
CO2: 23 mmol/L (ref 22–32)
Chloride: 106 mmol/L (ref 98–111)
Creatinine, Ser: 1.03 mg/dL (ref 0.61–1.24)
GFR calc Af Amer: >60 mL/min (ref >60)
GFR calc non Af Amer: >60 mL/min (ref >60)
GLUCOSE: 161 mg/dL — AB (ref 70–99)
Potassium: 4 mmol/L (ref 3.5–5.1)
Sodium: 140 mmol/L (ref 135–145)
Total Bilirubin: 0.6 mg/dL (ref 0.3–1.2)
Total Protein: 7.1 g/dL (ref 6.5–8.1)

## 2018-06-25 LAB — LACTATE DEHYDROGENASE: LDH: 336 U/L — ABNORMAL HIGH (ref 98–192)

## 2018-06-25 LAB — PSA: Prostatic Specific Antigen: 140.55 ng/mL — ABNORMAL HIGH (ref 0.00–4.00)

## 2018-06-26 ENCOUNTER — Other Ambulatory Visit: Payer: Self-pay

## 2018-06-26 ENCOUNTER — Inpatient Hospital Stay (HOSPITAL_COMMUNITY): Payer: Medicare HMO

## 2018-06-26 ENCOUNTER — Encounter (HOSPITAL_COMMUNITY): Payer: Self-pay | Admitting: Hematology

## 2018-06-26 ENCOUNTER — Inpatient Hospital Stay (HOSPITAL_BASED_OUTPATIENT_CLINIC_OR_DEPARTMENT_OTHER): Payer: Medicare HMO | Admitting: Hematology

## 2018-06-26 VITALS — BP 120/61 | HR 81 | Temp 98.4°F | Resp 18 | Wt 196.2 lb

## 2018-06-26 DIAGNOSIS — C7951 Secondary malignant neoplasm of bone: Secondary | ICD-10-CM | POA: Diagnosis not present

## 2018-06-26 DIAGNOSIS — M8580 Other specified disorders of bone density and structure, unspecified site: Secondary | ICD-10-CM | POA: Diagnosis not present

## 2018-06-26 DIAGNOSIS — R42 Dizziness and giddiness: Secondary | ICD-10-CM

## 2018-06-26 DIAGNOSIS — Z9221 Personal history of antineoplastic chemotherapy: Secondary | ICD-10-CM | POA: Diagnosis not present

## 2018-06-26 DIAGNOSIS — Z87891 Personal history of nicotine dependence: Secondary | ICD-10-CM

## 2018-06-26 DIAGNOSIS — K0889 Other specified disorders of teeth and supporting structures: Secondary | ICD-10-CM

## 2018-06-26 DIAGNOSIS — C61 Malignant neoplasm of prostate: Secondary | ICD-10-CM

## 2018-06-26 DIAGNOSIS — F1020 Alcohol dependence, uncomplicated: Secondary | ICD-10-CM

## 2018-06-26 DIAGNOSIS — K219 Gastro-esophageal reflux disease without esophagitis: Secondary | ICD-10-CM

## 2018-06-26 DIAGNOSIS — Z923 Personal history of irradiation: Secondary | ICD-10-CM

## 2018-06-26 DIAGNOSIS — E7800 Pure hypercholesterolemia, unspecified: Secondary | ICD-10-CM

## 2018-06-26 DIAGNOSIS — Z79899 Other long term (current) drug therapy: Secondary | ICD-10-CM

## 2018-06-26 DIAGNOSIS — Z79818 Long term (current) use of other agents affecting estrogen receptors and estrogen levels: Secondary | ICD-10-CM

## 2018-06-26 DIAGNOSIS — I10 Essential (primary) hypertension: Secondary | ICD-10-CM | POA: Diagnosis not present

## 2018-06-26 DIAGNOSIS — Z809 Family history of malignant neoplasm, unspecified: Secondary | ICD-10-CM

## 2018-06-26 DIAGNOSIS — Z800 Family history of malignant neoplasm of digestive organs: Secondary | ICD-10-CM

## 2018-06-26 LAB — TESTOSTERONE

## 2018-06-26 MED ORDER — LEUPROLIDE ACETATE (3 MONTH) 22.5 MG IM KIT
22.5000 mg | PACK | Freq: Once | INTRAMUSCULAR | Status: AC
  Administered 2018-06-26: 22.5 mg via INTRAMUSCULAR
  Filled 2018-06-26: qty 22.5

## 2018-06-26 NOTE — Progress Notes (Signed)
Walter Boone tolerated [name] injection well without complaints or incident Xgeva injection held today per MD orders due to future dental work to be done. VSS Pt discharged self ambulatory in satisfactory condition

## 2018-06-26 NOTE — Patient Instructions (Signed)
[facility] at [facility] Discharge Instructions  Today you saw Dr. K.   Thank you for choosing [facility] at [facility] to provide your oncology and hematology care.  To afford each patient quality time with our provider, please arrive at least 15 minutes before your scheduled appointment time.   If you have a lab appointment with the Cancer Center please come in thru the  Main Entrance and check in at the main information desk  You need to re-schedule your appointment should you arrive 10 or more minutes late.  We strive to give you quality time with our providers, and arriving late affects you and other patients whose appointments are after yours.  Also, if you no show three or more times for appointments you may be dismissed from the clinic at the providers discretion.     Again, thank you for choosing [facility].  Our hope is that these requests will decrease the amount of time that you wait before being seen by our physicians.       _____________________________________________________________  Should you have questions after your visit to [facility], please contact our office at [phone] between the hours of 8:30 a.m. and 4:30 p.m.  Voicemails left after 4:30 p.m. will not be returned until the following business day.  For prescription refill requests, have your pharmacy contact our office.       Resources For Cancer Patients and their Caregivers ? American Cancer Society: Can assist with transportation, wigs, general needs, runs Look Good Feel Better.        1-888-227-6333 ? Cancer Care: Provides financial assistance, online support groups, medication/co-pay assistance.  1-800-813-HOPE (4673) ? [facility] Assists [place] cancer patients and their families through emotional , educational and financial support.  336-427-4357 ? [place] DSS Where to apply for food  stamps, Medicaid and utility assistance. 336-342-1394 ? [facility]: Transportation to medical appointments. 336-347-2287 ? Social Security Administration: May apply for disability if have a Stage IV cancer. 336-342-7796 1-800-772-1213 ? [place] Aging, Disability and Transit Services: Assists with nutrition, care and transit needs. 336-349-2343  Cancer Center Support Programs:   > Cancer Support Group  2nd Tuesday of the month 1pm-2pm, Journey Room   > Creative Journey  3rd Tuesday of the month 1130am-1pm, Journey Room    

## 2018-06-26 NOTE — Patient Instructions (Signed)
[place] at [facility] Discharge Instructions  Received [name] injection today. Follow-up as scheduled. Call clinic for any questions or concerns   Thank you for choosing [place] at [facility] to provide your oncology and hematology care.  To afford each patient quality time with our provider, please arrive at least 15 minutes before your scheduled appointment time.   If you have a lab appointment with the [facility] please come in thru the  Main Entrance and check in at the main information desk  You need to re-schedule your appointment should you arrive 10 or more minutes late.  We strive to give you quality time with our providers, and arriving late affects you and other patients whose appointments are after yours.  Also, if you no show three or more times for appointments you may be dismissed from the clinic at the providers discretion.     Again, thank you for choosing [facility].  Our hope is that these requests will decrease the amount of time that you wait before being seen by our physicians.       _____________________________________________________________  Should you have questions after your visit to [facility], please contact our office at [phone] between the hours of 8:30 a.m. and 4:30 p.m.  Voicemails left after 4:30 p.m. will not be returned until the following business day.  For prescription refill requests, have your pharmacy contact our office.       Resources For Cancer Patients and their Caregivers ? American Cancer Society: Can assist with transportation, wigs, general needs, runs Look Good Feel Better.        (614)136-4891 ? Cancer Care: Provides financial assistance, online support groups, medication/co-pay assistance.  1-800-813-HOPE 414 578 7071) ? [facility] Assists [place] cancer patients and their families through emotional , educational and  financial support.  (786) 126-2989 ? [place] DSS Where to apply for food stamps, Medicaid and utility assistance. 408-684-6703 ? [facility]: Transportation to medical appointments. 318-521-0116 ? Social Security Administration: May apply for disability if have a Stage IV cancer. 910-298-0621 (202)779-7060 ? [employer], Disability and Transit Services: Assists with nutrition, care and transit needs. [facility] Support Programs:   > Cancer Support Group  2nd Tuesday of the month 1pm-2pm, Journey Room   > Creative Journey  3rd Tuesday of the month 1130am-1pm, Journey Room

## 2018-06-26 NOTE — Assessment & Plan Note (Signed)
1.  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 - Zytiga and prednisone started in January 2019. -Patient could not tolerate it well secondary to nausea and severe tiredness.  Has not been taking them as prescribed.  He completely discontinued it on May 17 when he went to visit family in [place]. -Last bone scan and CT scan on 04/27/2018 shows worsening bone metastasis with no visceral metastasis. - His PSA went up to 140 during this visit from 62.  Patient reports that he has not been taking the pill for the last 2 months.  I have told him to start taking at 2 tablets daily and increase it to 3 tablets daily in a week if he tolerates it well.  He is agreeable to this plan.  He does not want to start chemotherapy.  We have also talked about another option of switching him to enzalutamide for better tolerance.  But he will try low-dose Zytiga at this time.  I will monitor him in 1 month with repeat PSA.  2.  Bone metastasis: - He has been receiving denosumab monthly.  Last injection was 2 to 3 months ago and was held due to low calcium.  Today's calcium is adequate, but he has some problem with his right lower jaw teeth.  We will hold it until he sees the dentist.  He is continuing calcium 3 times a day.  3.  Family history: - Sister died of metastatic cancer.  Primary is not known to the patient.  Another sister had colon cancer.  We will refer him for genetic evaluation for BRCA 1/2 and PALB2 mutations.  If he is positive it will give [place] another option (PARP-1 inhibitor) for treatment.

## 2018-06-26 NOTE — Progress Notes (Signed)
[place], [place] [address]   CLINIC:  Medical Oncology/Hematology  PCP:  Dettinger, Fransisca Kaufmann, MD [place] [address] [phone]   REASON FOR VISIT:  Follow-up for prostate cancer  CURRENT THERAPY: Zytiga and prednisone patient stopped medication 04/23/2018   BRIEF ONCOLOGIC HISTORY:    Primary prostate cancer with metastasis from prostate to other site [facility])   06/20/2011 Initial Diagnosis    Prostate cancer ([place])      01/12/2016 Imaging    Bone density- This patient is considered osteopenic by World Healh Organization (WHO) Criteria.       03/16/2016 Imaging    CT CAP- No evidence of metastatic disease within the chest, abdomen, or pelvis. No other acute findings identified.      06/17/2016 Imaging    Bone scan- 1. Negative for metastatic pattern.      07/11/2016 Miscellaneous    Prolia every 6 months for osteopenia.      09/12/2016 -  Chemotherapy    Casodex/Depo-[name] every 3 months       09/27/2016 Imaging    CT CAP -No acute findings and no evidence for metastatic disease within the chest, abdomen or pelvis.      11/13/2017 Progression    Bone Scan: Multiple new abnormal sites of increased osseous tracer accumulation are identified as above consistent with osseous metastatic disease.  These include new foci of uptake at the proximal femora bilaterally.      11/16/2017 Progression    CT CAP-IMPRESSION: 1. New enlarged right pelvic sidewall lymph node and borderline left periaortic lymph node worrisome for metastatic adenopathy. 2. Subtle areas of sclerosis within the lumbar spine are more conspicuous than on the previous exam and worrisome for metastatic disease. Overall the areas of bone metastasis are more better demonstrated on bone scan from 11/13/2017 which demonstrate a progression of bone metastasis.        CANCER STAGING: Cancer Staging Primary prostate cancer with metastasis from  prostate to other site [facility]) Staging form: Prostate, AJCC 7th Edition - Clinical: Stage IIC (pT2c, N0, M0) - Signed by Baird Cancer, PA on 06/20/2011    INTERVAL HISTORY:  Mr. Delamater 67 y.o. male returns for routine follow-up for prostate cancer. Patient stopped taking his zytiga. He was not tolerating the medication. He had weakness, fatigue, nausea, and no appetite. Patient decided to stop it on his own. He does not wish to start chemo. Patient is having right lower jaw pain due to a cavity. Wishes to hold off on his [place] See so he can get his dental work done. Denies any nausea, vomiting, or diarrhea since stopping medication.    REVIEW OF SYSTEMS:  Review of Systems  Constitutional: Negative.   HENT:  Negative.   Eyes: Negative.   Respiratory: Negative.   Cardiovascular: Negative.   Gastrointestinal: Negative.   Endocrine: Negative.   Genitourinary: Negative.    Musculoskeletal: Negative.   Skin: Negative.   Neurological: Positive for dizziness.  Hematological: Negative.   Psychiatric/Behavioral: Negative.      PAST MEDICAL/SURGICAL HISTORY:  Past Medical History:  Diagnosis Date  . Acid reflux   . Alcoholism ([place])   . Arthritis   . Fatty liver   . Gynecomastia, male 01/11/2013   Secondary to prostate ca Tx.   . H/O Bell's palsy   . Hemorrhoids    history  . High cholesterol   . History of back injury 11/19/2013  . Hyperglycemia   .  Hyperlipidemia   . Hypertension   . Prostate cancer ([place])   . Prostate cancer ([place]) 06/20/2011  . Prostate carcinoma, recurrent ([place]) 08/10/2012   Past Surgical History:  Procedure Laterality Date  . [place]   left  . COLONOSCOPY  2008  . ESOPHAGOGASTRODUODENOSCOPY    . HERNIA REPAIR  2010  . I&D EXTREMITY Left 06/10/2015   Procedure: IRRIGATION AND DEBRIDEMENT EXTREMITY LEFT HAND EXPLORATION, nerve repair;  Surgeon: Charlotte Crumb, MD;  Location: [facility];  Service: Orthopedics;  Laterality: Left;  . KNEE SURGERY      left  . PROSTATE BIOPSY  11/06     SOCIAL HISTORY:  Social History   Socioeconomic History  . Marital status: Married    Spouse name: Not on file  . Number of children: Not on file  . Years of education: Not on file  . Highest education level: Not on file  Occupational History  . Not on file  Social Needs  . Financial resource strain: Not on file  . Food insecurity:    Worry: Not on file    Inability: Not on file  . Transportation needs:    Medical: Not on file    Non-medical: Not on file  Tobacco Use  . Smoking status: Former Smoker    Packs/day: 2.50    Years: 3.00    Pack years: 7.50  . Smokeless tobacco: Never Used  Substance and Sexual Activity  . Alcohol use: No    Comment: former drinker 20 years ago  . Drug use: No  . Sexual activity: Not on file  Lifestyle  . Physical activity:    Days per week: Not on file    Minutes per session: Not on file  . Stress: Not on file  Relationships  . Social connections:    Talks on phone: Not on file    Gets together: Not on file    Attends religious service: Not on file    Active member of club or organization: Not on file    Attends meetings of clubs or organizations: Not on file    Relationship status: Not on file  . Intimate partner violence:    Fear of current or ex partner: Not on file    Emotionally abused: Not on file    Physically abused: Not on file    Forced sexual activity: Not on file  Other Topics Concern  . Not on file  Social History Narrative  . Not on file    FAMILY HISTORY:  Family History  Problem Relation Age of Onset  . Heart failure Mother   . Diabetes Father   . Cancer Sister   . Diabetes Brother     CURRENT MEDICATIONS:  Outpatient Encounter Medications as of 06/26/2018  Medication Sig Note  . abiraterone acetate (ZYTIGA) 250 MG tablet Take 4 tablets (1,000 mg total) by mouth daily. Take on an empty stomach 1 hour before or 2 hours after a meal   . amLODipine (NORVASC) 10 MG  tablet Take 1 tablet (10 mg total) by mouth daily.   . Calcium-Magnesium-Vitamin D (CALCIUM 1200+D3 PO) Take 3 tablets by mouth daily.    . cephALEXin (KEFLEX) 500 MG capsule Take 1 capsule (500 mg total) by mouth 2 (two) times daily.   . Denosumab (XGEVA [place]) Inject into the skin every 30 (thirty) days.  01/28/2018: Patient was able to receive injection for February due to low calcium levels  . ibuprofen (ADVIL,MOTRIN) 800 MG tablet  Take 1 tablet (800 mg total) by mouth every 8 (eight) hours as needed.   Marland Kitchen Leuprolide Acetate ([name] IJ) Inject as directed every 3 (three) months. 01/28/2018: Due for injection  . promethazine (PHENERGAN) 25 MG tablet Take 1 tablet (25 mg total) by mouth every 4 (four) hours as needed for nausea or vomiting.   . tamsulosin (FLOMAX) 0.4 MG CAPS capsule Take 1 capsule (0.4 mg total) by mouth 2 (two) times daily.   . traMADol (ULTRAM) 50 MG tablet Take 1 tablet (50 mg total) by mouth every 6 (six) hours as needed.    No facility-administered encounter medications on file as of 06/26/2018.     ALLERGIES:  No Known Allergies   PHYSICAL EXAM:  ECOG Performance status: 1  Vitals:   06/26/18 1512  BP: 120/61  Pulse: 81  Resp: 18  Temp: 98.4 F (36.9 C)  SpO2: 100%   Filed Weights   06/26/18 1512  Weight: 196 lb 3.2 oz (89 kg)    Physical Exam  Constitutional: He is oriented to person, place, and time.  Cardiovascular: Normal rate, regular rhythm and normal heart sounds.  Pulmonary/Chest: Effort normal and breath sounds normal.  Neurological: He is alert and oriented to person, place, and time.  Skin: Skin is warm and dry.     LABORATORY DATA:  I have reviewed the labs as listed.  CBC    Component Value Date/Time   WBC 5.9 06/25/2018 1032   RBC 2.85 (L) 06/25/2018 1032   HGB 8.8 (L) 06/25/2018 1032   HGB 12.4 (L) 05/19/2016 1623   HCT 27.6 (L) 06/25/2018 1032   HCT 36.1 (L) 05/19/2016 1623   PLT 145 (L) 06/25/2018 1032   PLT 369 05/19/2016  1623   MCV 96.8 06/25/2018 1032   MCV 90 05/19/2016 1623   MCH 30.9 06/25/2018 1032   MCHC 31.9 06/25/2018 1032   RDW 16.2 (H) 06/25/2018 1032   RDW 13.3 05/19/2016 1623   LYMPHSABS 2.0 06/25/2018 1032   LYMPHSABS 2.3 05/19/2016 1623   MONOABS 0.4 06/25/2018 1032   EOSABS 0.2 06/25/2018 1032   EOSABS 0.3 05/19/2016 1623   BASOSABS 0.1 06/25/2018 1032   BASOSABS 0.0 05/19/2016 1623   CMP Latest Ref Rng & Units 06/25/2018 05/30/2018 04/27/2018  Glucose 70 - 99 mg/dL 161(H) 125(H) 132(H)  BUN 8 - 23 mg/dL 21 26 23(H)  Creatinine 0.61 - 1.24 mg/dL 1.03 1.03 0.82  Sodium 135 - 145 mmol/L 140 144 137  Potassium 3.5 - 5.1 mmol/L 4.0 4.1 4.3  Chloride 98 - 111 mmol/L 106 107(H) 106  CO2 22 - 32 mmol/L 23 21 24   Calcium 8.9 - 10.3 mg/dL 8.8(L) 8.5(L) 8.3(L)  Total Protein 6.5 - 8.1 g/dL 7.1 6.5 7.0  Total Bilirubin 0.3 - 1.2 mg/dL 0.6 0.2 0.5  Alkaline Phos 38 - 126 U/L 104 161(H) 195(H)  AST 15 - 41 U/L 25 17 21   ALT 0 - 44 U/L 15 13 16(L)       DIAGNOSTIC IMAGING:  I have independently reviewed the scans including bone scan and CT scan dated 04/27/2018 and discussed the results with the patient.  I reviewed the images with him.     ASSESSMENT & PLAN:   Primary prostate cancer with metastasis from prostate to other site [facility]) 1.  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 - Zytiga and prednisone started in January 2019. -Patient could not tolerate it well secondary to nausea and severe tiredness.  Has not been taking them as prescribed.  He completely discontinued it on May 17 when he went to visit family in [place]. -Last bone scan and CT scan on 04/27/2018 shows worsening bone metastasis with no visceral metastasis. - His PSA went up to 140 during this visit from 47.  Patient  reports that he has not been taking the pill for the last 2 months.  I have told him to start taking at 2 tablets daily and increase it to 3 tablets daily in a week if he tolerates it well.  He is agreeable to this plan.  He does not want to start chemotherapy.  We have also talked about another option of switching him to enzalutamide for better tolerance.  But he will try low-dose Zytiga at this time.  I will monitor him in 1 month with repeat PSA.  2.  Bone metastasis: - He has been receiving denosumab monthly.  Last injection was 2 to 3 months ago and was held due to low calcium.  Today's calcium is adequate, but he has some problem with his right lower jaw teeth.  We will hold it until he sees the dentist.  He is continuing calcium 3 times a day.  3.  Family history: - Sister died of metastatic cancer.  Primary is not known to the patient.  Another sister had colon cancer.  We will refer him for genetic evaluation for BRCA 1/2 and PALB2 mutations.  If he is positive it will give [place] another option (PARP-1 inhibitor) for treatment.      Orders placed this encounter:  Orders Placed This Encounter  Procedures  . CBC with Differential/Platelet  . Comprehensive metabolic panel  . PSA      [name], MD [place] [phone]

## 2018-07-02 ENCOUNTER — Other Ambulatory Visit (HOSPITAL_COMMUNITY): Payer: Self-pay | Admitting: *Deleted

## 2018-07-13 ENCOUNTER — Ambulatory Visit (INDEPENDENT_AMBULATORY_CARE_PROVIDER_SITE_OTHER): Payer: Medicare HMO | Admitting: Family Medicine

## 2018-07-13 ENCOUNTER — Encounter: Payer: Self-pay | Admitting: Family Medicine

## 2018-07-13 VITALS — BP 124/70 | HR 84 | Temp 98.0°F | Ht 66.0 in | Wt 195.2 lb

## 2018-07-13 DIAGNOSIS — M542 Cervicalgia: Secondary | ICD-10-CM | POA: Diagnosis not present

## 2018-07-13 MED ORDER — PREDNISONE 20 MG PO TABS: ORAL_TABLET | ORAL | 0 refills | Status: DC

## 2018-07-13 MED ORDER — CYCLOBENZAPRINE HCL 10 MG PO TABS: 10.0000 mg | ORAL_TABLET | Freq: Three times a day (TID) | ORAL | 0 refills | Status: DC | PRN

## 2018-07-13 MED ORDER — DICLOFENAC SODIUM 1 % TD GEL: 2.0000 g | Freq: Four times a day (QID) | TRANSDERMAL | 2 refills | Status: AC

## 2018-07-13 MED ORDER — TRAMADOL HCL 50 MG PO TABS: 50.0000 mg | ORAL_TABLET | Freq: Four times a day (QID) | ORAL | 1 refills | Status: DC | PRN

## 2018-07-13 MED ORDER — METHYLPREDNISOLONE ACETATE 80 MG/ML IJ SUSP
80.0000 mg | Freq: Once | INTRAMUSCULAR | Status: AC
  Administered 2018-07-13: 80 mg via INTRAMUSCULAR

## 2018-07-13 NOTE — Progress Notes (Signed)
BP 124/70   Pulse 84   Temp 98 F (36.7 C) (Oral)   Ht 5\' 6"  (1.676 m)   Wt 195 lb 3.2 oz (88.5 kg)   BMI 31.51 kg/m    Subjective:    [name], male    DOB: April 08, 1951, 67 y.o.   MRN: [id number]  HPI: Walter Boone is a 67 y.o. male presenting on 07/13/2018 for Shoulder Pain (Bilateral x 4 days and also neck pain. Pateint states that he did do some wood work.)   HPI Neck and shoulder pain bilateral Patient is coming in today with complaints of bilateral neck and shoulder pain that is been going on for the past 4 days.  He usually has a certain amount of neck and back pain because he has stage IV prosthetic cancer with metastasis to the bones including his spine.  He takes tramadol for this and it usually controls but 4 days ago he was helping his sons lift some logs and move some things and since then he has been having significant pain in his back and his neck going down both sides from the middle all the way to both shoulders.  He has pain with range of motion of his arms including across the body and behind his body and pain when he sleeps at night because of his neck as well.  He denies any numbness or weakness in either of his arms or going anywhere else in his body.  Relevant past medical, surgical, family and social history reviewed and updated as indicated. Interim medical history since our last visit reviewed. Allergies and medications reviewed and updated.  Review of Systems  Constitutional: Negative for chills and fever.  Respiratory: Negative for shortness of breath and wheezing.   Cardiovascular: Negative for chest pain and leg swelling.  Musculoskeletal: Positive for myalgias and neck pain. Negative for back pain and gait problem.  Skin: Negative for color change and rash.  Neurological: Negative for weakness and numbness.  All other systems reviewed and are negative.   Per HPI unless specifically indicated above   Allergies as of  07/13/2018   No Known Allergies     Medication List        Accurate as of 07/13/18  4:28 PM. Always use your most recent med list.          amLODipine 10 MG tablet Commonly known as:  NORVASC Take 1 tablet (10 mg total) by mouth daily.   CALCIUM 1200+D3 PO Take 3 tablets by mouth daily.   cyclobenzaprine 10 MG tablet Commonly known as:  FLEXERIL Take 1 tablet (10 mg total) by mouth 3 (three) times daily as needed for muscle spasms.   diclofenac sodium 1 % Gel Commonly known as:  VOLTAREN Apply 2 g topically 4 (four) times daily.   ibuprofen 800 MG tablet Commonly known as:  ADVIL,MOTRIN Take 1 tablet (800 mg total) by mouth every 8 (eight) hours as needed.   [name] IJ Inject as directed every 3 (three) months.   predniSONE 20 MG tablet Commonly known as:  DELTASONE Take 3 tabs daily for 1 week, then 2 tabs daily for week 2, then 1 tab daily for week 3.   tamsulosin 0.4 MG Caps capsule Commonly known as:  FLOMAX Take 1 capsule (0.4 mg total) by mouth 2 (two) times daily.   traMADol 50 MG tablet Commonly known as:  ULTRAM Take 1-2 tablets (50-100 mg total) by mouth every 6 (six) hours  as needed.   XGEVA [place] Inject into the skin every 30 (thirty) days.          Objective:    BP 124/70   Pulse 84   Temp 98 F (36.7 C) (Oral)   Ht 5\' 6"  (1.676 m)   Wt 195 lb 3.2 oz (88.5 kg)   BMI 31.51 kg/m   Wt Readings from Last 3 Encounters:  07/13/18 195 lb 3.2 oz (88.5 kg)  06/26/18 196 lb 3.2 oz (89 kg)  06/13/18 199 lb 6 oz (90.4 kg)    Physical Exam  Constitutional: He is oriented to person, place, and time. He appears well-developed and well-nourished. No distress.  Eyes: Conjunctivae are normal. No scleral icterus.  Cardiovascular: Normal rate, regular rhythm, normal heart sounds and intact distal pulses.  No murmur heard. Pulmonary/Chest: Effort normal and breath sounds normal. No respiratory distress. He has no wheezes.  Musculoskeletal: Normal range of  motion. He exhibits no edema.       Cervical back: He exhibits tenderness (Bilateral neck tenderness extending from the midline of his neck all the way across the upper parts of both shoulders.  Worse with cross body and behind body range of motion of the arms, improves with overhead range of motion of the arms.  Right side worse). He exhibits normal range of motion, no swelling, no deformity and normal pulse.  Neurological: He is alert and oriented to person, place, and time. Coordination normal.  Skin: Skin is warm and dry. No rash noted. He is not diaphoretic.  Psychiatric: He has a normal mood and affect. His behavior is normal.  Nursing note and vitals reviewed.       Assessment & Plan:   Problem List Items Addressed This Visit    None    Visit Diagnoses    Neck pain, bilateral    -  Primary   Acutely worsening chronic neck pain, normally takes tramadol but he was woodworking and is been in so much pain over 4 days.   Relevant Medications   methylPREDNISolone acetate (DEPO-MEDROL) injection 80 mg (Completed)   predniSONE (DELTASONE) 20 MG tablet   cyclobenzaprine (FLEXERIL) 10 MG tablet   diclofenac sodium (VOLTAREN) 1 % GEL   traMADol (ULTRAM) 50 MG tablet       Follow up plan: Return if symptoms worsen or fail to improve.  Counseling provided for all of the vaccine components No orders of the defined types were placed in this encounter.   Caryl Pina, MD [facility] 07/13/2018, 4:28 PM

## 2018-07-17 ENCOUNTER — Ambulatory Visit (HOSPITAL_COMMUNITY): Payer: Self-pay

## 2018-07-17 ENCOUNTER — Other Ambulatory Visit (HOSPITAL_COMMUNITY): Payer: Self-pay

## 2018-07-19 ENCOUNTER — Encounter (HOSPITAL_COMMUNITY): Payer: Self-pay | Admitting: Genetic Counselor

## 2018-07-23 ENCOUNTER — Other Ambulatory Visit (HOSPITAL_COMMUNITY): Payer: Self-pay

## 2018-07-24 ENCOUNTER — Ambulatory Visit (HOSPITAL_COMMUNITY): Payer: Self-pay

## 2018-07-24 ENCOUNTER — Ambulatory Visit (HOSPITAL_COMMUNITY): Payer: Self-pay | Admitting: Hematology

## 2018-07-25 ENCOUNTER — Other Ambulatory Visit (HOSPITAL_COMMUNITY): Payer: Self-pay | Admitting: *Deleted

## 2018-07-25 ENCOUNTER — Telehealth: Payer: Self-pay | Admitting: Family Medicine

## 2018-07-25 DIAGNOSIS — C61 Malignant neoplasm of prostate: Secondary | ICD-10-CM

## 2018-07-25 MED ORDER — ABIRATERONE ACETATE 250 MG PO TABS: 750.0000 mg | ORAL_TABLET | Freq: Every day | ORAL | 0 refills | Status: DC

## 2018-07-25 NOTE — Telephone Encounter (Signed)
Chart reviewed, patient to take Zytiga on a reduced dose 3 tablets daily.  Zytiga refilled with new directions and quantity.

## 2018-07-27 ENCOUNTER — Ambulatory Visit: Payer: Medicare HMO | Admitting: Family Medicine

## 2018-07-29 ENCOUNTER — Emergency Department (HOSPITAL_COMMUNITY)
Admission: EM | Admit: 2018-07-29 | Discharge: 2018-07-29 | Disposition: A | Discharge status: Home / Self Care | Payer: Medicare HMO | Attending: Emergency Medicine | Admitting: Emergency Medicine

## 2018-07-29 ENCOUNTER — Encounter (HOSPITAL_COMMUNITY): Payer: Self-pay | Admitting: *Deleted

## 2018-07-29 ENCOUNTER — Other Ambulatory Visit: Payer: Self-pay

## 2018-07-29 DIAGNOSIS — Z87891 Personal history of nicotine dependence: Secondary | ICD-10-CM | POA: Insufficient documentation

## 2018-07-29 DIAGNOSIS — R339 Retention of urine, unspecified: Secondary | ICD-10-CM | POA: Diagnosis not present

## 2018-07-29 DIAGNOSIS — R42 Dizziness and giddiness: Secondary | ICD-10-CM | POA: Insufficient documentation

## 2018-07-29 DIAGNOSIS — I10 Essential (primary) hypertension: Secondary | ICD-10-CM | POA: Insufficient documentation

## 2018-07-29 DIAGNOSIS — Z8546 Personal history of malignant neoplasm of prostate: Secondary | ICD-10-CM | POA: Diagnosis not present

## 2018-07-29 DIAGNOSIS — Z79899 Other long term (current) drug therapy: Secondary | ICD-10-CM | POA: Diagnosis not present

## 2018-07-29 DIAGNOSIS — D649 Anemia, unspecified: Secondary | ICD-10-CM | POA: Diagnosis not present

## 2018-07-29 LAB — URINALYSIS, ROUTINE W REFLEX MICROSCOPIC
BILIRUBIN URINE: NEGATIVE
Glucose, UA: 50 mg/dL — AB
HGB URINE DIPSTICK: NEGATIVE
Ketones, ur: NEGATIVE mg/dL
Leukocytes, UA: NEGATIVE
Nitrite: NEGATIVE
PROTEIN: NEGATIVE mg/dL
Specific Gravity, Urine: 1.016 (ref 1.005–1.030)
pH: 5 (ref 5.0–8.0)

## 2018-07-29 LAB — CBC WITH DIFFERENTIAL/PLATELET
Basophils Absolute: 0.2 10*3/uL (ref 0.0–0.1)
Basophils Relative: 0 %
EOS PCT: 2 %
Eosinophils Absolute: 0.1 10*3/uL (ref 0.0–0.7)
HCT: 26.6 % — ABNORMAL LOW (ref 39.0–52.0)
HEMOGLOBIN: 8.5 g/dL — AB (ref 13.0–17.0)
LYMPHS PCT: 22 %
Lymphs Abs: 2.4 10*3/uL (ref 0.7–4.0)
MCH: 31 pg (ref 26.0–34.0)
MCHC: 32 g/dL (ref 30.0–36.0)
MCV: 97.1 fL (ref 78.0–100.0)
Monocytes Absolute: 1.5 10*3/uL (ref 0.1–1.0)
Monocytes Relative: 21 %
NEUTROS PCT: 55 %
Neutro Abs: 6 10*3/uL (ref 1.7–7.7)
Platelets: 138 10*3/uL — ABNORMAL LOW (ref 150–400)
RBC: 2.74 MIL/uL — AB (ref 4.22–5.81)
RDW: 17.4 % — AB (ref 11.5–15.5)
WBC: 10.1 10*3/uL (ref 4.0–10.5)
nRBC: 9 /100 WBC — ABNORMAL HIGH

## 2018-07-29 LAB — COMPREHENSIVE METABOLIC PANEL
ALT: 16 U/L (ref 0–44)
ANION GAP: 9 (ref 5–15)
AST: 22 U/L (ref 15–41)
Albumin: 4 g/dL (ref 3.5–5.0)
Alkaline Phosphatase: 130 U/L — ABNORMAL HIGH (ref 38–126)
BILIRUBIN TOTAL: 0.6 mg/dL (ref 0.3–1.2)
BUN: 25 mg/dL — AB (ref 8–23)
CO2: 22 mmol/L (ref 22–32)
Calcium: 8.7 mg/dL — ABNORMAL LOW (ref 8.9–10.3)
Chloride: 107 mmol/L (ref 98–111)
Creatinine, Ser: 0.87 mg/dL (ref 0.61–1.24)
Glucose, Bld: 102 mg/dL — ABNORMAL HIGH (ref 70–99)
POTASSIUM: 4 mmol/L (ref 3.5–5.1)
Sodium: 138 mmol/L (ref 135–145)
TOTAL PROTEIN: 7.3 g/dL (ref 6.5–8.1)

## 2018-07-29 LAB — CK: Total CK: 51 U/L (ref 49–397)

## 2018-07-29 MED ORDER — SODIUM CHLORIDE 0.9 % IV BOLUS
1000.0000 mL | Freq: Once | INTRAVENOUS | Status: AC
  Administered 2018-07-29: 1000 mL via INTRAVENOUS

## 2018-07-29 NOTE — ED Provider Notes (Signed)
[facility] EMERGENCY DEPARTMENT Provider Note   CSN: [id number] Arrival date & time: 07/29/18  1843     History   Chief Complaint Chief Complaint  Patient presents with  . Dizziness    HPI Walter Boone is a 67 y.o. male.  HPI Patient presents with pain in his legs.  States it is in his legs are little in his lower back.  States more like joints.  Also states he now has some pain meds up into her shoulders.  States more like it is in the muscles.  No nausea or vomiting.  States when he feels up he feels lightheaded feels like he could fall.  States it is worse after his been sitting for a while.  History of prostate cancer.  No fevers or chills.  Has had good oral intake. Past Medical History:  Diagnosis Date  . Acid reflux   . Alcoholism ([place])   . Arthritis   . Fatty liver   . Gynecomastia, male 01/11/2013   Secondary to prostate ca Tx.   . H/O Bell's palsy   . Hemorrhoids    history  . High cholesterol   . History of back injury 11/19/2013  . Hyperglycemia   . Hyperlipidemia   . Hypertension   . Prostate cancer ([place])   . Prostate cancer ([place]) 06/20/2011  . Prostate carcinoma, recurrent ([place]) 08/10/2012    Patient Active Problem List   Diagnosis Date Noted  . Obesity (BMI 30.0-34.9) 05/30/2018  . Chronic right-sided low back pain with right-sided sciatica 07/06/2017  . GERD (gastroesophageal reflux disease) 11/22/2016  . Knee pain, left 02/11/2016  . Prediabetes 01/27/2016  . Osteopenia determined by x-ray 02/01/2015  . Hypertension   . Hyperlipidemia   . Gynecomastia, male 01/11/2013  . Primary prostate cancer with metastasis from prostate to other site [facility]) 06/20/2011  . H N P-CERVICAL 10/05/2009  . CERVICAL SPASM 10/05/2009    Past Surgical History:  Procedure Laterality Date  . [place]   left  . COLONOSCOPY  2008  . ESOPHAGOGASTRODUODENOSCOPY    . HAND SURGERY     nerve was torn - left hand  . HERNIA REPAIR  2010  . I&D EXTREMITY  Left 06/10/2015   Procedure: IRRIGATION AND DEBRIDEMENT EXTREMITY LEFT HAND EXPLORATION, nerve repair;  Surgeon: Charlotte Crumb, MD;  Location: [place];  Service: Orthopedics;  Laterality: Left;  . KNEE SURGERY     left  . PROSTATE BIOPSY  11/06        Home Medications    Prior to Admission medications   Medication Sig Start Date End Date Taking? Authorizing Provider  amLODipine (NORVASC) 10 MG tablet Take 1 tablet (10 mg total) by mouth daily. 04/25/18  Yes Dettinger, Fransisca Kaufmann, MD  Calcium-Magnesium-Vitamin D (CALCIUM 1200+D3 PO) Take 3 tablets by mouth daily.    Yes [provider]  Denosumab (XGEVA [place]) Inject into the skin every 30 (thirty) days.    Yes [provider]  diclofenac sodium (VOLTAREN) 1 % GEL Apply 2 g topically 4 (four) times daily. 07/13/18  Yes Dettinger, Fransisca Kaufmann, MD  ibuprofen (ADVIL,MOTRIN) 800 MG tablet Take 1 tablet (800 mg total) by mouth every 8 (eight) hours as needed. 04/25/18  Yes Dettinger, Fransisca Kaufmann, MD  Leuprolide Acetate ([name] IJ) Inject as directed every 3 (three) months.   Yes [provider]  Menthol-Methyl Salicylate (MUSCLE RUB) 10-15 % CREA Apply 1 application topically as needed for muscle pain.   Yes  [provider]  predniSONE (DELTASONE) 20 MG tablet Take 3 tabs daily for 1 week, then 2 tabs daily for week 2, then 1 tab daily for week 3. Patient taking differently: Take 20 mg by mouth.  07/13/18  Yes Dettinger, Fransisca Kaufmann, MD  tamsulosin (FLOMAX) 0.4 MG CAPS capsule Take 1 capsule (0.4 mg total) by mouth 2 (two) times daily. 04/25/18  Yes Dettinger, Fransisca Kaufmann, MD  traMADol (ULTRAM) 50 MG tablet Take 1-2 tablets (50-100 mg total) by mouth every 6 (six) hours as needed. 07/13/18  Yes Dettinger, Fransisca Kaufmann, MD  abiraterone acetate (ZYTIGA) 250 MG tablet Take 3 tablets (750 mg total) by mouth daily. Take on an empty stomach 1 hour before or 2 hours after a meal Patient not taking: Reported on 07/29/2018 07/25/18   [name], MD  cyclobenzaprine (FLEXERIL) 10 MG tablet Take 1 tablet (10 mg total) by mouth 3 (three) times daily as needed for muscle spasms. Patient not taking: Reported on 07/29/2018 07/13/18   Dettinger, Fransisca Kaufmann, MD    Family History Family History  Problem Relation Age of Onset  . Heart failure Mother   . Diabetes Father   . Cancer Sister   . Diabetes Brother     Social History Social History   Tobacco Use  . Smoking status: Former Smoker    Packs/day: 2.50    Years: 3.00    Pack years: 7.50  . Smokeless tobacco: Never Used  Substance Use Topics  . Alcohol use: No    Comment: former drinker 20 years ago  . Drug use: No     Allergies   Patient has no known allergies.   Review of Systems Review of Systems  Constitutional: Negative for appetite change and fever.  HENT: Negative for congestion.   Respiratory: Negative for shortness of breath.   Cardiovascular: Negative for chest pain.  Gastrointestinal: Negative for abdominal pain.  Genitourinary: Negative for dysuria and flank pain.  Musculoskeletal: Positive for myalgias and neck pain. Negative for back pain.  Skin: Negative for wound.  Neurological: Positive for light-headedness.  Hematological: Negative for adenopathy.  Psychiatric/Behavioral: Negative for confusion.     Physical Exam Updated Vital Signs BP (!) 123/92   Pulse 96   Temp 98.1 F (36.7 C) (Oral)   Resp 17   Ht 5\' 6"  (1.676 m)   Wt 83.9 kg   SpO2 97%   BMI 29.86 kg/m   Physical Exam  Constitutional: He appears well-developed.  HENT:  Head: Atraumatic.  Neck: Neck supple.  Cardiovascular: Normal rate.  Pulmonary/Chest: Effort normal.  Abdominal: Soft.  Musculoskeletal: He exhibits no edema or tenderness.  No muscle tenderness.  Strength intact in his extremities.  Neurological: He is alert.  Skin: Skin is warm. Capillary refill takes less than 2 seconds.  Psychiatric: He has a normal mood and affect.     ED Treatments /  Results  Labs (all labs ordered are listed, but only abnormal results are displayed) Labs Reviewed  COMPREHENSIVE METABOLIC PANEL - Abnormal; Notable for the following components:      Result Value   Glucose, Bld 102 (*)    BUN 25 (*)    Calcium 8.7 (*)    Alkaline Phosphatase 130 (*)    All other components within normal limits  CBC WITH DIFFERENTIAL/PLATELET - Abnormal; Notable for the following components:   RBC 2.74 (*)    Hemoglobin 8.5 (*)    HCT 26.6 (*)    RDW 17.4 (*)  Platelets 138 (*)    nRBC 9 (*)    All other components within normal limits  URINALYSIS, ROUTINE W REFLEX MICROSCOPIC - Abnormal; Notable for the following components:   Glucose, UA 50 (*)    All other components within normal limits  CK    EKG EKG Interpretation  Date/Time:  Sunday July 29 2018 18:57:38 EDT Ventricular Rate:  89 PR Interval:  160 QRS Duration: 94 QT Interval:  378 QTC Calculation: 459 R Axis:   1 Text Interpretation:  Normal sinus rhythm Normal ECG Confirmed by Davonna Belling [phone]) on 07/29/2018 8:15:32 PM   Radiology No results found.  Procedures Procedures (including critical care time)  Medications Ordered in ED Medications  sodium chloride 0.9 % bolus 1,000 mL (0 mLs Intravenous Stopped 07/29/18 2215)     Initial Impression / Assessment and Plan / ED Course  I have reviewed the triage vital signs and the nursing notes.  Pertinent labs & imaging results that were available during my care of the patient were reviewed by me and considered in my medical decision making (see chart for details).     Patient with dizziness and lightheadedness.  Found to have anemia.  Recently seen by his oncologist with similar lab results.  No GI bleeding.  Can follow-up with them.  Feels better after IV fluids.  However found to have urinary retention.  History of prostate cancer.  Eventually was able to get Foley catheter placed after some effort.  Patient will take Foley  catheter home and follow with his urologist.  Final Clinical Impressions(s) / ED Diagnoses   Final diagnoses:  Dizziness  Anemia, unspecified type  Urinary retention    ED Discharge Orders    None       Davonna Belling, MD 07/29/18 2332

## 2018-07-29 NOTE — ED Notes (Signed)
Was unable to in & out cath pt. No resistance met but no return. Bladder scan revealed greater than 200 mL

## 2018-07-29 NOTE — Discharge Instructions (Addendum)
Keep yourself hydrated.  Follow-up with your oncologist for your anemia.  Follow-up with urology for your Foley catheter and urinary retention.

## 2018-07-29 NOTE — ED Notes (Signed)
Pt c/o not being able to urinate- 1000mg  NS has completed. Dr Alvino Chapel made aware- new orders received for in/out cath.

## 2018-07-29 NOTE — ED Triage Notes (Signed)
Pt c/o pain that started in bilateral legs a few days ago and it has now progressed to all over his body. Pt reports dizziness started at the same time. He reports when he stands up he feels like he is going to fall face down.

## 2018-07-31 ENCOUNTER — Other Ambulatory Visit: Payer: Self-pay

## 2018-07-31 ENCOUNTER — Emergency Department (HOSPITAL_COMMUNITY)
Admission: EM | Admit: 2018-07-31 | Discharge: 2018-07-31 | Disposition: A | Discharge status: Home / Self Care | Payer: Medicare HMO | Attending: Emergency Medicine | Admitting: Emergency Medicine

## 2018-07-31 ENCOUNTER — Encounter (HOSPITAL_COMMUNITY): Payer: Self-pay | Admitting: Emergency Medicine

## 2018-07-31 DIAGNOSIS — Z8546 Personal history of malignant neoplasm of prostate: Secondary | ICD-10-CM | POA: Diagnosis not present

## 2018-07-31 DIAGNOSIS — Z79899 Other long term (current) drug therapy: Secondary | ICD-10-CM | POA: Insufficient documentation

## 2018-07-31 DIAGNOSIS — Z87891 Personal history of nicotine dependence: Secondary | ICD-10-CM | POA: Diagnosis not present

## 2018-07-31 DIAGNOSIS — F1020 Alcohol dependence, uncomplicated: Secondary | ICD-10-CM | POA: Diagnosis not present

## 2018-07-31 DIAGNOSIS — I10 Essential (primary) hypertension: Secondary | ICD-10-CM | POA: Diagnosis not present

## 2018-07-31 DIAGNOSIS — R369 Urethral discharge, unspecified: Secondary | ICD-10-CM

## 2018-07-31 MED ORDER — CEPHALEXIN 500 MG PO CAPS: 500.0000 mg | ORAL_CAPSULE | Freq: Four times a day (QID) | ORAL | 0 refills | Status: DC

## 2018-07-31 MED ORDER — CEPHALEXIN 500 MG PO CAPS
500.0000 mg | ORAL_CAPSULE | Freq: Once | ORAL | Status: AC
  Administered 2018-07-31: 500 mg via ORAL
  Filled 2018-07-31: qty 1

## 2018-07-31 NOTE — Discharge Instructions (Addendum)
You were evaluated in the emergency department for some burning and discharge from your penis after you had Foley catheter placed here 3 days ago.  You wish to have the catheter removed and we have done so.  You will need to monitor that you were still able to urinate after the catheter has been removed.  We are also placing her on some antibiotics.  Please call your urologist tomorrow for close follow-up.  Return if unable to urinate within the next 6 to 8 hours.

## 2018-07-31 NOTE — ED Triage Notes (Signed)
Pt had foley catheter placed on Sunday d/t urinary retention. States last night pt began to have purulent and foul odor drainage from meatus. Denies fever, N/V/D/

## 2018-07-31 NOTE — ED Provider Notes (Signed)
[facility] EMERGENCY DEPARTMENT Provider Note   CSN: [id number] Arrival date & time: 07/31/18  1750     History   Chief Complaint Chief Complaint  Patient presents with  . Foley catheter problem    HPI Walter Boone is a 67 y.o. male.  He has a history of prostate cancer.  He was here on Sunday with body aches and bone pain.  While he was here he was given IV fluids and ultimately was unable to his own.  After multiple attempts he had a coud placed in his had that and since then.  He is complaining of a couple of days of burning in his penis and then today noticed some purulent drainage from the penis.  The catheter continues to drain urine well.  He is asking to have the catheter removed.  He denies any fevers.  The history is provided by the patient.  Male GU Problem  Primary symptoms include penile discharge. This is a new problem. The current episode started 12 to 24 hours ago. The problem occurs hourly. The problem has not changed since onset.The discharge is purulent. Pertinent negatives include no nausea, no vomiting and no abdominal pain. There has been no fever. He has tried nothing for the symptoms. The treatment provided no relief. Sexual activity: non-contributory.    Past Medical History:  Diagnosis Date  . Acid reflux   . Alcoholism ([place])   . Arthritis   . Fatty liver   . Gynecomastia, male 01/11/2013   Secondary to prostate ca Tx.   . H/O Bell's palsy   . Hemorrhoids    history  . High cholesterol   . History of back injury 11/19/2013  . Hyperglycemia   . Hyperlipidemia   . Hypertension   . Prostate cancer ([place])   . Prostate cancer ([place]) 06/20/2011  . Prostate carcinoma, recurrent ([place]) 08/10/2012    Patient Active Problem List   Diagnosis Date Noted  . Obesity (BMI 30.0-34.9) 05/30/2018  . Chronic right-sided low back pain with right-sided sciatica 07/06/2017  . GERD (gastroesophageal reflux disease) 11/22/2016  . Knee pain, left 02/11/2016  .  Prediabetes 01/27/2016  . Osteopenia determined by x-ray 02/01/2015  . Hypertension   . Hyperlipidemia   . Gynecomastia, male 01/11/2013  . Primary prostate cancer with metastasis from prostate to other site [facility]) 06/20/2011  . H N P-CERVICAL 10/05/2009  . CERVICAL SPASM 10/05/2009    Past Surgical History:  Procedure Laterality Date  . [place]   left  . COLONOSCOPY  2008  . ESOPHAGOGASTRODUODENOSCOPY    . HAND SURGERY     nerve was torn - left hand  . HERNIA REPAIR  2010  . I&D EXTREMITY Left 06/10/2015   Procedure: IRRIGATION AND DEBRIDEMENT EXTREMITY LEFT HAND EXPLORATION, nerve repair;  Surgeon: Charlotte Crumb, MD;  Location: [place];  Service: Orthopedics;  Laterality: Left;  . KNEE SURGERY     left  . PROSTATE BIOPSY  11/06        Home Medications    Prior to Admission medications   Medication Sig Start Date End Date Taking? Authorizing Provider  abiraterone acetate (ZYTIGA) 250 MG tablet Take 3 tablets (750 mg total) by mouth daily. Take on an empty stomach 1 hour before or 2 hours after a meal Patient not taking: Reported on 07/29/2018 07/25/18   [name], MD  amLODipine (NORVASC) 10 MG tablet Take 1 tablet (10 mg total) by mouth daily. 04/25/18   Dettinger, Vonna Kotyk  A, MD  Calcium-Magnesium-Vitamin D (CALCIUM 1200+D3 PO) Take 3 tablets by mouth daily.     [provider]  cyclobenzaprine (FLEXERIL) 10 MG tablet Take 1 tablet (10 mg total) by mouth 3 (three) times daily as needed for muscle spasms. Patient not taking: Reported on 07/29/2018 07/13/18   Dettinger, Fransisca Kaufmann, MD  Denosumab (XGEVA ) Inject into the skin every 30 (thirty) days.     [provider]  diclofenac sodium (VOLTAREN) 1 % GEL Apply 2 g topically 4 (four) times daily. 07/13/18   Dettinger, Fransisca Kaufmann, MD  ibuprofen (ADVIL,MOTRIN) 800 MG tablet Take 1 tablet (800 mg total) by mouth every 8 (eight) hours as needed. 04/25/18   Dettinger, Fransisca Kaufmann, MD  Leuprolide Acetate  ([name] IJ) Inject as directed every 3 (three) months.    [provider]  Menthol-Methyl Salicylate (MUSCLE RUB) 10-15 % CREA Apply 1 application topically as needed for muscle pain.    [provider]  predniSONE (DELTASONE) 20 MG tablet Take 3 tabs daily for 1 week, then 2 tabs daily for week 2, then 1 tab daily for week 3. Patient taking differently: Take 20 mg by mouth.  07/13/18   Dettinger, Fransisca Kaufmann, MD  tamsulosin (FLOMAX) 0.4 MG CAPS capsule Take 1 capsule (0.4 mg total) by mouth 2 (two) times daily. 04/25/18   Dettinger, Fransisca Kaufmann, MD  traMADol (ULTRAM) 50 MG tablet Take 1-2 tablets (50-100 mg total) by mouth every 6 (six) hours as needed. 07/13/18   Dettinger, Fransisca Kaufmann, MD    Family History Family History  Problem Relation Age of Onset  . Heart failure Mother   . Diabetes Father   . Cancer Sister   . Diabetes Brother     Social History Social History   Tobacco Use  . Smoking status: Former Smoker    Packs/day: 2.50    Years: 3.00    Pack years: 7.50  . Smokeless tobacco: Never Used  Substance Use Topics  . Alcohol use: No    Comment: former drinker 20 years ago  . Drug use: No     Allergies   Patient has no known allergies.   Review of Systems Review of Systems  Constitutional: Negative for fever.  HENT: Negative for sore throat.   Eyes: Negative for visual disturbance.  Respiratory: Negative for shortness of breath.   Cardiovascular: Negative for chest pain.  Gastrointestinal: Negative for abdominal pain, nausea and vomiting.  Genitourinary: Positive for difficulty urinating, discharge and penile discharge.  Musculoskeletal: Positive for arthralgias.  Skin: Negative for rash.  Neurological: Negative for headaches.     Physical Exam Updated Vital Signs BP 108/60 (BP Location: Left Arm)   Pulse 99   Temp 98.3 F (36.8 C) (Oral)   Resp 20   Ht 5\' 7"  (1.702 m)   Wt 86.2 kg   SpO2 99%   BMI 29.76 kg/m   Physical Exam    Constitutional: He appears well-developed and well-nourished.  HENT:  Head: Normocephalic and atraumatic.  Eyes: Conjunctivae are normal.  Neck: Neck supple.  Cardiovascular: Normal rate, regular rhythm, normal heart sounds and intact distal pulses.  Pulmonary/Chest: Effort normal and breath sounds normal. He has no wheezes. He has no rales.  Abdominal: Soft. He exhibits no mass. There is no tenderness. There is no guarding.  Genitourinary:  Genitourinary Comments: He is an uncircumcised penis.  Testicles are descended and nontender.  He has a Foley catheter in place.  Upon expression at the urethral  meatus there is some purulent tan discharge.  This was sent for culture.  Musculoskeletal: Normal range of motion. He exhibits no tenderness or deformity.  Neurological: He is alert. Gait normal. GCS eye subscore is 4. GCS verbal subscore is 5. GCS motor subscore is 6.  Skin: Skin is warm and dry.  Psychiatric: He has a normal mood and affect.  Nursing note and vitals reviewed.    ED Treatments / Results  Labs (all labs ordered are listed, but only abnormal results are displayed) Labs Reviewed  AEROBIC CULTURE (SUPERFICIAL SPECIMEN)    EKG None  Radiology No results found.  Procedures Procedures (including critical care time)  Medications Ordered in ED Medications  cephALEXin (KEFLEX) capsule 500 mg (has no administration in time range)     Initial Impression / Assessment and Plan / ED Course  I have reviewed the triage vital signs and the nursing notes.  Pertinent labs & imaging results that were available during my care of the patient were reviewed by me and considered in my medical decision making (see chart for details).  Clinical Course as of Jul 31 1826  Tue Jul 31, 2018  1825 Patient is asking to have his catheter removed.  It sounds like he was a difficult catheter have placed but he is never had retention before.  He said he was urinating fine before he got here  and they gave him IV fluids.  We reviewed that he may have difficulty passing urine on his own again see if we pull the catheter out.  He is on Flomax and has a Dealer in town here in [place].  Ultimately he still wanted the catheter out and I told him that he will need to drink fluids and try to urinate on his own and return if unable to urinate in the next 6 hours.   [MB]    Clinical Course User Index [MB] Hayden Rasmussen, MD      Final Clinical Impressions(s) / ED Diagnoses   Final diagnoses:  Discharge from penis    ED Discharge Orders         Ordered    cephALEXin (KEFLEX) 500 MG capsule  4 times daily     07/31/18 1848           Hayden Rasmussen, MD 08/01/18 1227

## 2018-08-02 ENCOUNTER — Ambulatory Visit (INDEPENDENT_AMBULATORY_CARE_PROVIDER_SITE_OTHER): Payer: Medicare HMO | Admitting: Pediatrics

## 2018-08-02 ENCOUNTER — Encounter: Payer: Self-pay | Admitting: Pediatrics

## 2018-08-02 ENCOUNTER — Ambulatory Visit (INDEPENDENT_AMBULATORY_CARE_PROVIDER_SITE_OTHER): Payer: Medicare HMO

## 2018-08-02 VITALS — BP 118/68 | HR 82 | Temp 98.2°F | Ht 67.0 in | Wt 193.8 lb

## 2018-08-02 DIAGNOSIS — M79601 Pain in right arm: Secondary | ICD-10-CM | POA: Diagnosis not present

## 2018-08-02 DIAGNOSIS — C7951 Secondary malignant neoplasm of bone: Secondary | ICD-10-CM

## 2018-08-02 DIAGNOSIS — M79621 Pain in right upper arm: Secondary | ICD-10-CM | POA: Diagnosis not present

## 2018-08-02 MED ORDER — OXYCODONE-ACETAMINOPHEN 5-325 MG PO TABS: 1.0000 | ORAL_TABLET | Freq: Two times a day (BID) | ORAL | 0 refills | Status: DC | PRN

## 2018-08-02 NOTE — Progress Notes (Signed)
  Subjective:   [name], male    DOB: 1951-02-04, 67 y.o.   MRN: [id number] CC: Arm Pain (Right)  HPI: Walter Boone is a 67 y.o. male   67 year old with history of metastatic prostate cancer here with worsening arm pain.  He has known bony metastases spine, ribs, pelvis, proximal humeri calvaria and calvarium. No injuries to the arm that he knows of. Points to right above R elbow with where pain is.   Pain has been bothering him throughout the day and keeping him up at night. He was prescribed tramadol in the past, then oxycodone but at that time oxycodone made him too sleepy. Now the tramadol is not helping enough so he stopped taking it.   No fevers, redness or rash on the arm. Appetite has been ok.  Relevant past medical, surgical, family and social history reviewed. Allergies and medications reviewed and updated. Social History   Tobacco Use  Smoking Status Former Smoker  . Packs/day: 2.50  . Years: 3.00  . Pack years: 7.50  Smokeless Tobacco Never Used   ROS: Per HPI   Objective:    BP 118/68   Pulse 82   Temp 98.2 F (36.8 C) (Oral)   Ht 5\' 7"  (1.702 m)   Wt 193 lb 12.8 oz (87.9 kg)   BMI 30.35 kg/m   Wt Readings from Last 3 Encounters:  08/02/18 193 lb 12.8 oz (87.9 kg)  07/31/18 190 lb (86.2 kg)  07/29/18 185 lb (83.9 kg)    Gen: NAD, alert, cooperative with exam, NCAT EYES: EOMI, no conjunctival injection, or no icterus CV: NRRR, normal S1/S2, no murmur, distal pulses 2+ b/l Resp: CTABL, no wheezes, normal WOB Ext: No edema, warm Neuro: Alert and oriented, strength equal b/l UE and LE, coordination grossly normal MSK: normal muscle bulk  Assessment & Plan:  Walter Boone was seen today for arm pain.  Diagnoses and all orders for this visit:  Bony metastasis ([place]) Will do trial of below, take 1-2 times as needed for pain. Do not take and drive, can cause drowsiness. RTC for worsening symptoms. F/u with PCP 4 weeks, sooner if needed.  Overdue for f/u with oncology, pt has phone number, will call to reschedule. -     oxyCODONE-acetaminophen (PERCOCET/ROXICET) 5-325 MG tablet; Take 1 tablet by mouth every 12 (twelve) hours as needed for severe pain.  Pain of right upper extremity -     DG Humerus Right; Future -     oxyCODONE-acetaminophen (PERCOCET/ROXICET) 5-325 MG tablet; Take 1 tablet by mouth every 12 (twelve) hours as needed for severe pain.   Follow up plan: Return in about 4 weeks (around 08/30/2018) for follow up with Dr. Warrick Parisian. Walter Found, MD [place]

## 2018-08-02 NOTE — Patient Instructions (Addendum)
Call to set up follow up appointment with oncology

## 2018-08-03 ENCOUNTER — Encounter: Payer: Self-pay | Admitting: Pediatrics

## 2018-08-03 LAB — AEROBIC CULTURE W GRAM STAIN (SUPERFICIAL SPECIMEN)

## 2018-08-03 LAB — AEROBIC CULTURE  (SUPERFICIAL SPECIMEN)

## 2018-08-04 ENCOUNTER — Telehealth: Payer: Self-pay

## 2018-08-04 NOTE — Telephone Encounter (Signed)
Post ED Visit - Positive Culture Follow-up  Culture report reviewed by antimicrobial stewardship pharmacist:  []  Elenor Quinones, Pharm.D. []  Heide Guile, Pharm.D., BCPS AQ-ID []  Parks Neptune, Pharm.D., BCPS []  Alycia Rossetti, Pharm.D., BCPS []  [place], Pharm.D., BCPS, AAHIVP []  Legrand Como, Pharm.D., BCPS, AAHIVP []  Salome Arnt, PharmD, BCPS []  Johnnette Gourd, PharmD, BCPS []  Hughes Better, PharmD, BCPS [x]  Leeroy Cha, PharmD  Positive Aerobic culture Treated with Cephalexin, organism sensitive to the same and no further patient follow-up is required at this time.  Genia Del 08/04/2018, 9:44 AM

## 2018-08-17 ENCOUNTER — Encounter (HOSPITAL_COMMUNITY): Payer: Self-pay

## 2018-08-17 NOTE — Progress Notes (Signed)
Patient refused genetic counseling and had scheduler cancel the appt for him.

## 2018-08-23 ENCOUNTER — Encounter (HOSPITAL_COMMUNITY): Payer: Self-pay | Admitting: Genetic Counselor

## 2018-08-27 ENCOUNTER — Other Ambulatory Visit (HOSPITAL_COMMUNITY): Payer: Self-pay

## 2018-08-27 ENCOUNTER — Ambulatory Visit (HOSPITAL_COMMUNITY): Payer: Self-pay | Admitting: Internal Medicine

## 2018-08-27 ENCOUNTER — Ambulatory Visit (HOSPITAL_COMMUNITY): Payer: Self-pay

## 2018-08-29 ENCOUNTER — Telehealth (HOSPITAL_COMMUNITY): Payer: Self-pay | Admitting: Internal Medicine

## 2018-08-29 NOTE — Telephone Encounter (Signed)
Submitted a retro auth request to [facility] for xgeva. Start dt 02/14/18

## 2018-08-30 ENCOUNTER — Encounter (HOSPITAL_COMMUNITY): Payer: Self-pay

## 2018-08-30 ENCOUNTER — Inpatient Hospital Stay (HOSPITAL_COMMUNITY): Payer: Medicare HMO | Attending: Hematology

## 2018-08-30 ENCOUNTER — Inpatient Hospital Stay (HOSPITAL_COMMUNITY): Payer: Medicare HMO

## 2018-08-30 ENCOUNTER — Other Ambulatory Visit: Payer: Self-pay

## 2018-08-30 VITALS — BP 123/71 | HR 81 | Temp 97.5°F | Resp 18

## 2018-08-30 DIAGNOSIS — Z79818 Long term (current) use of other agents affecting estrogen receptors and estrogen levels: Secondary | ICD-10-CM | POA: Diagnosis not present

## 2018-08-30 DIAGNOSIS — Z9221 Personal history of antineoplastic chemotherapy: Secondary | ICD-10-CM | POA: Diagnosis not present

## 2018-08-30 DIAGNOSIS — C7951 Secondary malignant neoplasm of bone: Secondary | ICD-10-CM | POA: Insufficient documentation

## 2018-08-30 DIAGNOSIS — Z923 Personal history of irradiation: Secondary | ICD-10-CM | POA: Insufficient documentation

## 2018-08-30 DIAGNOSIS — C61 Malignant neoplasm of prostate: Secondary | ICD-10-CM

## 2018-08-30 DIAGNOSIS — D649 Anemia, unspecified: Secondary | ICD-10-CM | POA: Diagnosis not present

## 2018-08-30 DIAGNOSIS — Z23 Encounter for immunization: Secondary | ICD-10-CM | POA: Insufficient documentation

## 2018-08-30 DIAGNOSIS — M8580 Other specified disorders of bone density and structure, unspecified site: Secondary | ICD-10-CM

## 2018-08-30 LAB — CBC WITH DIFFERENTIAL/PLATELET
BASOS PCT: 0 %
Band Neutrophils: 16 %
Basophils Absolute: 0 10*3/uL (ref 0.0–0.1)
Blasts: 0 %
EOS PCT: 3 %
Eosinophils Absolute: 0.2 10*3/uL (ref 0.0–0.7)
HEMATOCRIT: 24.3 % — AB (ref 39.0–52.0)
HEMOGLOBIN: 7.7 g/dL — AB (ref 13.0–17.0)
LYMPHS ABS: 1.4 10*3/uL (ref 0.7–4.0)
LYMPHS PCT: 23 %
MCH: 31.3 pg (ref 26.0–34.0)
MCHC: 31.7 g/dL (ref 30.0–36.0)
MCV: 98.8 fL (ref 78.0–100.0)
MONOS PCT: 6 %
MYELOCYTES: 8 %
Metamyelocytes Relative: 10 %
Monocytes Absolute: 0.4 10*3/uL (ref 0.1–1.0)
NEUTROS PCT: 30 %
NRBC: 0 /100{WBCs}
Neutro Abs: 4 10*3/uL (ref 1.7–7.7)
OTHER: 4 %
Platelets: 106 10*3/uL — ABNORMAL LOW (ref 150–400)
Promyelocytes Relative: 0 %
RBC: 2.46 MIL/uL — AB (ref 4.22–5.81)
RDW: 16.9 % — ABNORMAL HIGH (ref 11.5–15.5)
WBC: 6.2 10*3/uL (ref 4.0–10.5)

## 2018-08-30 LAB — COMPREHENSIVE METABOLIC PANEL
ALBUMIN: 3.8 g/dL (ref 3.5–5.0)
ALK PHOS: 141 U/L — AB (ref 38–126)
ALT: 11 U/L (ref 0–44)
ANION GAP: 10 (ref 5–15)
AST: 23 U/L (ref 15–41)
BILIRUBIN TOTAL: 0.5 mg/dL (ref 0.3–1.2)
BUN: 20 mg/dL (ref 8–23)
CO2: 24 mmol/L (ref 22–32)
Calcium: 9.8 mg/dL (ref 8.9–10.3)
Chloride: 106 mmol/L (ref 98–111)
Creatinine, Ser: 1.1 mg/dL (ref 0.61–1.24)
GFR calc Af Amer: >60 mL/min (ref >60)
GFR calc non Af Amer: >60 mL/min (ref >60)
GLUCOSE: 127 mg/dL — AB (ref 70–99)
Potassium: 3.6 mmol/L (ref 3.5–5.1)
Sodium: 140 mmol/L (ref 135–145)
TOTAL PROTEIN: 7 g/dL (ref 6.5–8.1)

## 2018-08-30 LAB — PSA: Prostatic Specific Antigen: 192 ng/mL — ABNORMAL HIGH (ref 0.00–4.00)

## 2018-08-30 MED ORDER — DENOSUMAB 120 MG/1.7ML ~~LOC~~ SOLN
120.0000 mg | Freq: Once | SUBCUTANEOUS | Status: AC
  Administered 2018-08-30: 120 mg via SUBCUTANEOUS
  Filled 2018-08-30: qty 1.7

## 2018-08-30 MED ORDER — INFLUENZA VAC SPLIT QUAD 0.5 ML IM SUSY
PREFILLED_SYRINGE | INTRAMUSCULAR | Status: AC
  Filled 2018-08-30: qty 0.5

## 2018-08-30 MED ORDER — INFLUENZA VAC SPLIT QUAD 0.5 ML IM SUSY
0.5000 mL | PREFILLED_SYRINGE | Freq: Once | INTRAMUSCULAR | Status: AC
  Administered 2018-08-30: 0.5 mL via INTRAMUSCULAR

## 2018-08-30 NOTE — Patient Instructions (Signed)
[place] at [facility] Discharge Instructions  Flu vaccine and xgeva given today. Follow up as scheduled   Thank you for choosing [place] at [facility] to provide your oncology and hematology care.  To afford each patient quality time with our provider, please arrive at least 15 minutes before your scheduled appointment time.   If you have a lab appointment with the [facility] please come in thru the  Main Entrance and check in at the main information desk  You need to re-schedule your appointment should you arrive 10 or more minutes late.  We strive to give you quality time with our providers, and arriving late affects you and other patients whose appointments are after yours.  Also, if you no show three or more times for appointments you may be dismissed from the clinic at the providers discretion.     Again, thank you for choosing [facility].  Our hope is that these requests will decrease the amount of time that you wait before being seen by our physicians.       _____________________________________________________________  Should you have questions after your visit to [facility], please contact our office at [phone] between the hours of 8:00 a.m. and 4:30 p.m.  Voicemails left after 4:00 p.m. will not be returned until the following business day.  For prescription refill requests, have your pharmacy contact our office and allow 72 hours.    Cancer Center Support Programs:   > Cancer Support Group  2nd Tuesday of the month 1pm-2pm, Journey Room

## 2018-08-30 NOTE — Progress Notes (Signed)
xgeva and flu vaccine given today.  See MAR.  Patient tolerated it well without problems. Vitals stable and discharged home from clinic ambulatory. Follow up as scheduled.

## 2018-09-02 ENCOUNTER — Other Ambulatory Visit: Payer: Self-pay

## 2018-09-02 ENCOUNTER — Emergency Department (HOSPITAL_COMMUNITY)
Admission: EM | Admit: 2018-09-02 | Discharge: 2018-09-02 | Disposition: A | Discharge status: Home / Self Care | Payer: Medicare HMO | Attending: Emergency Medicine | Admitting: Emergency Medicine

## 2018-09-02 DIAGNOSIS — Z79899 Other long term (current) drug therapy: Secondary | ICD-10-CM | POA: Insufficient documentation

## 2018-09-02 DIAGNOSIS — R339 Retention of urine, unspecified: Secondary | ICD-10-CM | POA: Insufficient documentation

## 2018-09-02 DIAGNOSIS — Z87891 Personal history of nicotine dependence: Secondary | ICD-10-CM | POA: Diagnosis not present

## 2018-09-02 DIAGNOSIS — I10 Essential (primary) hypertension: Secondary | ICD-10-CM | POA: Diagnosis not present

## 2018-09-02 LAB — CBC
HCT: 25 % — ABNORMAL LOW (ref 39.0–52.0)
HEMOGLOBIN: 7.9 g/dL — AB (ref 13.0–17.0)
MCH: 31 pg (ref 26.0–34.0)
MCHC: 31.6 g/dL (ref 30.0–36.0)
MCV: 98 fL (ref 78.0–100.0)
Platelets: 100 10*3/uL — ABNORMAL LOW (ref 150–400)
RBC: 2.55 MIL/uL — AB (ref 4.22–5.81)
RDW: 17.3 % — ABNORMAL HIGH (ref 11.5–15.5)
WBC: 7.3 10*3/uL (ref 4.0–10.5)

## 2018-09-02 LAB — BASIC METABOLIC PANEL
ANION GAP: 8 (ref 5–15)
BUN: 24 mg/dL — ABNORMAL HIGH (ref 8–23)
CALCIUM: 8.6 mg/dL — AB (ref 8.9–10.3)
CO2: 21 mmol/L — ABNORMAL LOW (ref 22–32)
CREATININE: 0.98 mg/dL (ref 0.61–1.24)
Chloride: 107 mmol/L (ref 98–111)
GFR calc non Af Amer: >60 mL/min (ref >60)
GLUCOSE: 136 mg/dL — AB (ref 70–99)
Potassium: 3.6 mmol/L (ref 3.5–5.1)
Sodium: 136 mmol/L (ref 135–145)

## 2018-09-02 LAB — URINALYSIS, ROUTINE W REFLEX MICROSCOPIC
Bilirubin Urine: NEGATIVE
GLUCOSE, UA: NEGATIVE mg/dL
HGB URINE DIPSTICK: NEGATIVE
Ketones, ur: NEGATIVE mg/dL
Leukocytes, UA: NEGATIVE
Nitrite: NEGATIVE
PROTEIN: NEGATIVE mg/dL
Specific Gravity, Urine: 1.016 (ref 1.005–1.030)
pH: 6 (ref 5.0–8.0)

## 2018-09-02 NOTE — Discharge Instructions (Signed)
Start taking MiraLAX 2 times a day until you are having regular soft stools, this may cause diarrhea.  Please be aware that your difficulty with urination is likely a combination of a prostate enlargement, and a history of constipation.  At this time please take the medications as above, keep the catheter in place, do not try to pull it out, see the urologist within 1 week for recheck.  You may return for increasing pain, catheter dysfunction such as the inability to drain, vomiting or fevers.  These be aware that you are anemic.  This is a chronic problem but you should have your doctor reevaluate this within the next 2 weeks.

## 2018-09-02 NOTE — ED Provider Notes (Signed)
[facility] EMERGENCY DEPARTMENT Provider Note   CSN: [id number] Arrival date & time: 09/02/18  0756     History   Chief Complaint Chief Complaint  Patient presents with  . Urinary Retention    HPI Walter Boone is a 67 y.o. male.  HPI  67 y/o male - hx of Htn, and Prostate CA - has had intermittent urinary retention and in fact review of the EMR shows that the pt was here on 8/18 and then on 8/20 after had urinary retention - then removed catheter and has been able to urinate since.  Has started to have some constipation and then noticed that he couldn't urinate well and the last time he urinated was last night - he has progressive lower abdominal fuillness and pain and now has severe sx - he has no f/c/n/v and no hematuria.  Has seen Urology in town in the past.  Past Medical History:  Diagnosis Date  . Acid reflux   . Alcoholism ([place])   . Arthritis   . Fatty liver   . Gynecomastia, male 01/11/2013   Secondary to prostate ca Tx.   . H/O Bell's palsy   . Hemorrhoids    history  . High cholesterol   . History of back injury 11/19/2013  . Hyperglycemia   . Hyperlipidemia   . Hypertension   . Prostate cancer ([place])   . Prostate cancer ([place]) 06/20/2011  . Prostate carcinoma, recurrent ([place]) 08/10/2012    Patient Active Problem List   Diagnosis Date Noted  . Obesity (BMI 30.0-34.9) 05/30/2018  . Chronic right-sided low back pain with right-sided sciatica 07/06/2017  . GERD (gastroesophageal reflux disease) 11/22/2016  . Knee pain, left 02/11/2016  . Prediabetes 01/27/2016  . Osteopenia determined by x-ray 02/01/2015  . Hypertension   . Hyperlipidemia   . Gynecomastia, male 01/11/2013  . Primary prostate cancer with metastasis from prostate to other site [facility]) 06/20/2011  . H N P-CERVICAL 10/05/2009  . CERVICAL SPASM 10/05/2009    Past Surgical History:  Procedure Laterality Date  . [place]   left  . COLONOSCOPY  2008  .  ESOPHAGOGASTRODUODENOSCOPY    . HAND SURGERY     nerve was torn - left hand  . HERNIA REPAIR  2010  . I&D EXTREMITY Left 06/10/2015   Procedure: IRRIGATION AND DEBRIDEMENT EXTREMITY LEFT HAND EXPLORATION, nerve repair;  Surgeon: Charlotte Crumb, MD;  Location: [place];  Service: Orthopedics;  Laterality: Left;  . KNEE SURGERY     left  . PROSTATE BIOPSY  11/06        Home Medications    Prior to Admission medications   Medication Sig Start Date End Date Taking? Authorizing Provider  amLODipine (NORVASC) 10 MG tablet Take 1 tablet (10 mg total) by mouth daily. 04/25/18   Dettinger, Fransisca Kaufmann, MD  Calcium-Magnesium-Vitamin D (CALCIUM 1200+D3 PO) Take 3 tablets by mouth daily.     [provider]  cephALEXin (KEFLEX) 500 MG capsule Take 1 capsule (500 mg total) by mouth 4 (four) times daily. 07/31/18   Hayden Rasmussen, MD  Denosumab (XGEVA [place]) Inject into the skin every 30 (thirty) days.     [provider]  diclofenac sodium (VOLTAREN) 1 % GEL Apply 2 g topically 4 (four) times daily. 07/13/18   Dettinger, Fransisca Kaufmann, MD  ibuprofen (ADVIL,MOTRIN) 800 MG tablet Take 1 tablet (800 mg total) by mouth every 8 (eight) hours as needed. 04/25/18   Dettinger, Fransisca Kaufmann,  MD  Leuprolide Acetate ([name] IJ) Inject as directed every 3 (three) months.    [provider]  Menthol-Methyl Salicylate (MUSCLE RUB) 10-15 % CREA Apply 1 application topically as needed for muscle pain.    [provider]  oxyCODONE-acetaminophen (PERCOCET/ROXICET) 5-325 MG tablet Take 1 tablet by mouth every 12 (twelve) hours as needed for severe pain. 08/02/18   Eustaquio Maize, MD  tamsulosin (FLOMAX) 0.4 MG CAPS capsule Take 1 capsule (0.4 mg total) by mouth 2 (two) times daily. 04/25/18   Dettinger, Fransisca Kaufmann, MD    Family History Family History  Problem Relation Age of Onset  . Heart failure Mother   . Diabetes Father   . Cancer Sister   . Diabetes Brother     Social History Social  History   Tobacco Use  . Smoking status: Former Smoker    Packs/day: 2.50    Years: 3.00    Pack years: 7.50  . Smokeless tobacco: Never Used  Substance Use Topics  . Alcohol use: No    Comment: former drinker 20 years ago  . Drug use: No     Allergies   Patient has no known allergies.   Review of Systems Review of Systems  Constitutional: Negative for chills and fever.  Eyes: Negative for redness.  Respiratory: Negative for cough and shortness of breath.   Cardiovascular: Negative for leg swelling.  Gastrointestinal: Positive for abdominal pain and constipation. Negative for nausea and vomiting.  Genitourinary: Positive for difficulty urinating.  Musculoskeletal: Positive for back pain. Negative for joint swelling.  Skin: Negative for rash.  Neurological: Negative for weakness.  Hematological: Does not bruise/bleed easily.     Physical Exam Updated Vital Signs BP 131/78   Pulse 96   Temp 97.9 F (36.6 C) (Oral)   Ht 1.676 m (5\' 6" )   Wt 83.9 kg   SpO2 98%   BMI 29.86 kg/m   Physical Exam  Constitutional: He appears well-developed and well-nourished. He appears distressed.  HENT:  Head: Normocephalic and atraumatic.  Mouth/Throat: Oropharynx is clear and moist. No oropharyngeal exudate.  Eyes: Pupils are equal, round, and reactive to light. Conjunctivae and EOM are normal. Right eye exhibits no discharge. Left eye exhibits no discharge. No scleral icterus.  Neck: Normal range of motion. Neck supple. No JVD present. No thyromegaly present.  Cardiovascular: Normal rate, regular rhythm, normal heart sounds and intact distal pulses. Exam reveals no gallop and no friction rub.  No murmur heard. Pulmonary/Chest: Effort normal and breath sounds normal. No respiratory distress. He has no wheezes. He has no rales.  Abdominal: Soft. Bowel sounds are normal. He exhibits no distension and no mass. There is tenderness ( focal ttp int eh SP region.  fullness palpated to the  umbilicus).  Musculoskeletal: Normal range of motion. He exhibits no edema or tenderness.  Lymphadenopathy:    He has no cervical adenopathy.  Neurological: He is alert. Coordination normal.  Skin: Skin is warm and dry. No rash noted. No erythema.  Psychiatric: He has a normal mood and affect. His behavior is normal.  Nursing note and vitals reviewed.    ED Treatments / Results  Labs (all labs ordered are listed, but only abnormal results are displayed) Labs Reviewed  BASIC METABOLIC PANEL - Abnormal; Notable for the following components:      Result Value   CO2 21 (*)    Glucose, Bld 136 (*)    BUN 24 (*)    Calcium 8.6 (*)  All other components within normal limits  CBC - Abnormal; Notable for the following components:   RBC 2.55 (*)    Hemoglobin 7.9 (*)    HCT 25.0 (*)    RDW 17.3 (*)    Platelets 100 (*)    All other components within normal limits  URINE CULTURE  URINALYSIS, ROUTINE W REFLEX MICROSCOPIC    EKG None  Radiology No results found.  Procedures Procedures (including critical care time)  Medications Ordered in ED Medications - No data to display   Initial Impression / Assessment and Plan / ED Course  I have reviewed the triage vital signs and the nursing notes.  Pertinent labs & imaging results that were available during my care of the patient were reviewed by me and considered in my medical decision making (see chart for details).  Clinical Course as of Sep 02 906  Sun Sep 02, 2018  0850 Renal function preserved based on BMP   [BM]  1275 Basic metabolic panel(!) [BM]  1700 Urinalysis, Routine w reflex microscopic [BM]  0905 CBC shows anemia - this is close to baseline of prior studies.  BMP reassuring, UA without infection - catheter guidelines given to patient - switched to leg bag - pt stable for d/c.   [BM]    Clinical Course User Index [BM] Noemi Chapel, MD    The patient has an appearance of extreme discomfort secondary to  urinary retention, Foley catheter will be placed, bedside ultrasound reveals an extremely distended urinary bladder, the patient will likely need to have further evaluation by urology as an outpatient to consider other procedures to fix this.  I would also consider that constipation may be pressing on it already engorged prostate, will prescribe laxatives as well to help with the constipation.  The patient is agreeable to the plan.  Vital signs unremarkable.  Hemoglobin  Date Value Ref Range Status  09/02/2018 7.9 (L) 13.0 - 17.0 g/dL Final  08/30/2018 7.7 (L) 13.0 - 17.0 g/dL Final  07/29/2018 8.5 (L) 13.0 - 17.0 g/dL Final  06/25/2018 8.8 (L) 13.0 - 17.0 g/dL Final    Vitals:   09/02/18 0805 09/02/18 0808  BP: 131/78   Pulse: 96   Temp: 97.9 F (36.6 C)   TempSrc: Oral   SpO2: 98%   Weight:  83.9 kg  Height:  1.676 m (5\' 6" )     Final Clinical Impressions(s) / ED Diagnoses   Final diagnoses:  Urinary retention      Noemi Chapel, MD 09/02/18 [phone]

## 2018-09-02 NOTE — ED Triage Notes (Signed)
Pt states he has prostate cancer, but yesterday afternoon starting having urinary retention. 10/10 pain

## 2018-09-03 LAB — PATHOLOGIST SMEAR REVIEW

## 2018-09-04 LAB — URINE CULTURE: CULTURE: NO GROWTH

## 2018-09-06 ENCOUNTER — Encounter: Payer: Self-pay | Admitting: Family Medicine

## 2018-09-06 ENCOUNTER — Ambulatory Visit (INDEPENDENT_AMBULATORY_CARE_PROVIDER_SITE_OTHER): Payer: Medicare HMO | Admitting: Family Medicine

## 2018-09-06 VITALS — BP 116/68 | HR 83 | Temp 97.5°F | Ht 66.0 in | Wt 192.6 lb

## 2018-09-06 DIAGNOSIS — K5903 Drug induced constipation: Secondary | ICD-10-CM

## 2018-09-06 DIAGNOSIS — R339 Retention of urine, unspecified: Secondary | ICD-10-CM | POA: Diagnosis not present

## 2018-09-06 DIAGNOSIS — C7951 Secondary malignant neoplasm of bone: Secondary | ICD-10-CM

## 2018-09-06 DIAGNOSIS — M79601 Pain in right arm: Secondary | ICD-10-CM | POA: Diagnosis not present

## 2018-09-06 DIAGNOSIS — D649 Anemia, unspecified: Secondary | ICD-10-CM

## 2018-09-06 MED ORDER — OXYCODONE-ACETAMINOPHEN 5-325 MG PO TABS: 1.0000 | ORAL_TABLET | Freq: Two times a day (BID) | ORAL | 0 refills | Status: DC | PRN

## 2018-09-06 MED ORDER — MIRTAZAPINE 15 MG PO TABS: 15.0000 mg | ORAL_TABLET | Freq: Every day | ORAL | 1 refills | Status: DC

## 2018-09-06 MED ORDER — TRAMADOL HCL 50 MG PO TABS: 100.0000 mg | ORAL_TABLET | ORAL | 2 refills | Status: DC | PRN

## 2018-09-06 MED ORDER — LACTULOSE 10 GM/15ML PO SOLN: 10.0000 g | Freq: Two times a day (BID) | ORAL | 2 refills | Status: DC | PRN

## 2018-09-06 NOTE — Progress Notes (Signed)
BP 116/68   Pulse 83   Temp (!) 97.5 F (36.4 C) (Oral)   Ht 5' 6"  (1.676 m)   Wt 192 lb 9.6 oz (87.4 kg)   BMI 31.09 kg/m    Subjective:    [name], male    DOB: 02-Apr-1951, 67 y.o.   MRN: [id number]  HPI: Walter Boone is a 67 y.o. male presenting on 09/06/2018 for Hospitalization Follow-up (9/22 AP- urinary retenshion- Has apt with 10/1 with [facility]. Has a cath  now.); Anemia (Patient was told in hospital to follow up with PCP about his new dx of anemia.); Anorexia; and Constipation (Patient states that he has been having constipation problems for over a month and has been taking Miralax and it is not helping like it should. )   HPI Patient is coming in for hospital follow-up for anemia and fatigue and constipation and decreased appetite.  He was in the hospital on 09/02/2018 at [facility] with urinary retention.  He currently has a catheter in now and has an appointment with urology on 09/11/2018.  Patient has been taking MiraLAX and he feels like is not helping like it should.  Patient is coming in for recheck of his anemia today.  Patient does have known prostate cancer with bony metastasis.  He has been having fatigue along with all of the changes and when they checked his blood levels there is lower than it had been and he is coming in today to see if there is a possible reason why.  He does have an oncologist/hematologist that he is planning to go back and see them as well.  Patient denies any blood in his stool or any blood from anywhere else.  Relevant past medical, surgical, family and social history reviewed and updated as indicated. Interim medical history since our last visit reviewed. Allergies and medications reviewed and updated.  Review of Systems  Constitutional: Positive for appetite change and fatigue. Negative for chills and fever.  Eyes: Negative for discharge.  Respiratory: Negative for cough, shortness of breath and  wheezing.   Cardiovascular: Negative for chest pain, palpitations and leg swelling.  Gastrointestinal: Positive for constipation. Negative for abdominal distention, abdominal pain, blood in stool, diarrhea, nausea and vomiting.  Musculoskeletal: Positive for arthralgias. Negative for back pain and gait problem.  Skin: Negative for rash.  Neurological: Negative for dizziness, weakness, light-headedness, numbness and headaches.  All other systems reviewed and are negative.   Per HPI unless specifically indicated above   Allergies as of 09/06/2018   No Known Allergies     Medication List        Accurate as of 09/06/18 10:57 AM. Always use your most recent med list.          amLODipine 10 MG tablet Commonly known as:  NORVASC Take 1 tablet (10 mg total) by mouth daily.   CALCIUM 1200+D3 PO Take 3 tablets by mouth daily.   diclofenac sodium 1 % Gel Commonly known as:  VOLTAREN Apply 2 g topically 4 (four) times daily.   ibuprofen 800 MG tablet Commonly known as:  ADVIL,MOTRIN Take 1 tablet (800 mg total) by mouth every 8 (eight) hours as needed.   lactulose 10 GM/15ML solution Commonly known as:  CHRONULAC Take 15 mLs (10 g total) by mouth 2 (two) times daily as needed for mild constipation.   [name] IJ Inject as directed every 3 (three) months.   MIRALAX packet Generic drug:  polyethylene  glycol Take 17 g by mouth daily.   mirtazapine 15 MG tablet Commonly known as:  REMERON Take 1 tablet (15 mg total) by mouth at bedtime.   MUSCLE RUB 10-15 % Crea Apply 1 application topically as needed for muscle pain.   oxyCODONE-acetaminophen 5-325 MG tablet Commonly known as:  PERCOCET/ROXICET Take 1 tablet by mouth every 12 (twelve) hours as needed for severe pain.   tamsulosin 0.4 MG Caps capsule Commonly known as:  FLOMAX Take 1 capsule (0.4 mg total) by mouth 2 (two) times daily.   traMADol 50 MG tablet Commonly known as:  ULTRAM Take 2 tablets (100 mg total) by  mouth every 4 (four) hours as needed.   XGEVA [place] Inject into the skin every 30 (thirty) days.          Objective:    BP 116/68   Pulse 83   Temp (!) 97.5 F (36.4 C) (Oral)   Ht 5' 6"  (1.676 m)   Wt 192 lb 9.6 oz (87.4 kg)   BMI 31.09 kg/m   Wt Readings from Last 3 Encounters:  09/06/18 192 lb 9.6 oz (87.4 kg)  09/02/18 185 lb (83.9 kg)  08/02/18 193 lb 12.8 oz (87.9 kg)    Physical Exam  Constitutional: He is oriented to person, place, and time. He appears well-developed and well-nourished. No distress.  Eyes: Conjunctivae are normal. No scleral icterus.  Neck: Neck supple. No thyromegaly present.  Cardiovascular: Normal rate, regular rhythm, normal heart sounds and intact distal pulses.  No murmur heard. Pulmonary/Chest: Effort normal and breath sounds normal. No respiratory distress. He has no wheezes.  Abdominal: Soft. Bowel sounds are normal. He exhibits no distension and no mass. There is no tenderness. There is no guarding.  Musculoskeletal: Normal range of motion. He exhibits no edema or tenderness (No tenderness on exam to palpation but tenderness is deeper.).  Lymphadenopathy:    He has no cervical adenopathy.  Neurological: He is alert and oriented to person, place, and time. Coordination normal.  Skin: Skin is warm and dry. No rash noted. He is not diaphoretic.  Psychiatric: He has a normal mood and affect. His behavior is normal.  Nursing note and vitals reviewed.       Assessment & Plan:   Problem List Items Addressed This Visit    None    Visit Diagnoses    Drug-induced constipation    -  Primary   Relevant Orders   Anemia Profile B (Completed)   CMP14+EGFR (Completed)   Urinary retention       Pain of right upper extremity       Relevant Medications   oxyCODONE-acetaminophen (PERCOCET/ROXICET) 5-325 MG tablet   Bony metastasis (HCC)       Relevant Medications   oxyCODONE-acetaminophen (PERCOCET/ROXICET) 5-325 MG tablet   Anemia, unspecified  type       Relevant Orders   Anemia Profile B (Completed)   CMP14+EGFR (Completed)      She was instructed to double the MiraLAX and start using it twice a day Follow up plan: Return in about 3 months (around 12/06/2018), or if symptoms worsen or fail to improve, for Recheck bone pain from mets and anemia.  Counseling provided for all of the vaccine components Orders Placed This Encounter  Procedures  . Anemia Profile Reeder Felcia Huebert, MD [facility] 09/06/2018, 10:57 AM

## 2018-09-07 LAB — CMP14+EGFR
A/G RATIO: 1.9 (ref 1.2–2.2)
ALBUMIN: 4.2 g/dL (ref 3.6–4.8)
ALT: 10 IU/L (ref 0–44)
AST: 18 IU/L (ref 0–40)
Alkaline Phosphatase: 137 IU/L — ABNORMAL HIGH (ref 39–117)
BUN / CREAT RATIO: 23 (ref 10–24)
BUN: 19 mg/dL (ref 8–27)
Bilirubin Total: 0.3 mg/dL (ref 0.0–1.2)
CALCIUM: 8.7 mg/dL (ref 8.6–10.2)
CHLORIDE: 104 mmol/L (ref 96–106)
CO2: 20 mmol/L (ref 20–29)
Creatinine, Ser: 0.83 mg/dL (ref 0.76–1.27)
GFR, EST AFRICAN AMERICAN: 107 mL/min/{1.73_m2} (ref >59)
GFR, EST NON AFRICAN AMERICAN: 92 mL/min/{1.73_m2} (ref >59)
GLOBULIN, TOTAL: 2.2 g/dL (ref 1.5–4.5)
Glucose: 134 mg/dL — ABNORMAL HIGH (ref 65–99)
POTASSIUM: 3.9 mmol/L (ref 3.5–5.2)
SODIUM: 141 mmol/L (ref 134–144)
Total Protein: 6.4 g/dL (ref 6.0–8.5)

## 2018-09-07 LAB — ANEMIA PROFILE B
BASOS ABS: 0.1 10*3/uL (ref 0.0–0.2)
BASOS: 1 %
EOS (ABSOLUTE): 0.2 10*3/uL (ref 0.0–0.4)
EOS: 2 %
FERRITIN: 615 ng/mL — AB (ref 30–400)
Folate: 7.8 ng/mL (ref >3.0)
HEMOGLOBIN: 7.3 g/dL — AB (ref 13.0–17.7)
Hematocrit: 22.4 % — ABNORMAL LOW (ref 37.5–51.0)
IRON SATURATION: 35 % (ref 15–55)
Iron: 105 ug/dL (ref 38–169)
LYMPHS ABS: 2.2 10*3/uL (ref 0.7–3.1)
Lymphs: 28 %
MCH: 30.8 pg (ref 26.6–33.0)
MCHC: 32.6 g/dL (ref 31.5–35.7)
MCV: 95 fL (ref 79–97)
Monocytes Absolute: 0.6 10*3/uL (ref 0.1–0.9)
Monocytes: 7 %
NEUTROS ABS: 3.4 10*3/uL (ref 1.4–7.0)
NEUTROS PCT: 43 %
NRBC: 9 % — ABNORMAL HIGH (ref 0–0)
Platelets: 113 10*3/uL — ABNORMAL LOW (ref 150–450)
RBC: 2.37 x10E6/uL — CL (ref 4.14–5.80)
RDW: 16.3 % — ABNORMAL HIGH (ref 12.3–15.4)
RETIC CT PCT: 3.7 % — AB (ref 0.6–2.6)
Total Iron Binding Capacity: 303 ug/dL (ref 250–450)
UIBC: 198 ug/dL (ref 111–343)
Vitamin B-12: 1774 pg/mL — ABNORMAL HIGH (ref 232–1245)
WBC: 7.9 10*3/uL (ref 3.4–10.8)

## 2018-09-07 LAB — IMMATURE CELLS
MYELOCYTES: 4 % — AB (ref 0–0)
Metamyelocytes: 15 % — ABNORMAL HIGH (ref 0–0)

## 2018-09-11 ENCOUNTER — Ambulatory Visit (INDEPENDENT_AMBULATORY_CARE_PROVIDER_SITE_OTHER): Payer: Medicare HMO | Admitting: Urology

## 2018-09-11 DIAGNOSIS — C6951 Malignant neoplasm of right lacrimal gland and duct: Secondary | ICD-10-CM

## 2018-09-11 DIAGNOSIS — C61 Malignant neoplasm of prostate: Secondary | ICD-10-CM | POA: Diagnosis not present

## 2018-09-11 DIAGNOSIS — R338 Other retention of urine: Secondary | ICD-10-CM | POA: Diagnosis not present

## 2018-09-11 DIAGNOSIS — R9721 Rising PSA following treatment for malignant neoplasm of prostate: Secondary | ICD-10-CM | POA: Diagnosis not present

## 2018-09-14 ENCOUNTER — Ambulatory Visit: Payer: Self-pay | Admitting: Urology

## 2018-09-15 DIAGNOSIS — I10 Essential (primary) hypertension: Secondary | ICD-10-CM | POA: Diagnosis not present

## 2018-09-15 DIAGNOSIS — K219 Gastro-esophageal reflux disease without esophagitis: Secondary | ICD-10-CM | POA: Diagnosis not present

## 2018-09-15 DIAGNOSIS — N400 Enlarged prostate without lower urinary tract symptoms: Secondary | ICD-10-CM | POA: Diagnosis not present

## 2018-09-15 DIAGNOSIS — Z87891 Personal history of nicotine dependence: Secondary | ICD-10-CM | POA: Diagnosis not present

## 2018-09-19 ENCOUNTER — Other Ambulatory Visit: Payer: Self-pay | Admitting: Urology

## 2018-09-19 DIAGNOSIS — C61 Malignant neoplasm of prostate: Secondary | ICD-10-CM

## 2018-09-20 ENCOUNTER — Ambulatory Visit (HOSPITAL_COMMUNITY)
Admission: RE | Admit: 2018-09-20 | Discharge: 2018-09-20 | Disposition: A | Discharge status: Home / Self Care | Payer: Medicare HMO | Source: Ambulatory Visit | Attending: Urology | Admitting: Urology

## 2018-09-20 DIAGNOSIS — C61 Malignant neoplasm of prostate: Secondary | ICD-10-CM | POA: Diagnosis not present

## 2018-09-24 DIAGNOSIS — C7951 Secondary malignant neoplasm of bone: Secondary | ICD-10-CM | POA: Diagnosis not present

## 2018-09-24 DIAGNOSIS — C61 Malignant neoplasm of prostate: Secondary | ICD-10-CM | POA: Diagnosis not present

## 2018-09-24 DIAGNOSIS — R339 Retention of urine, unspecified: Secondary | ICD-10-CM | POA: Diagnosis not present

## 2018-09-25 ENCOUNTER — Other Ambulatory Visit (HOSPITAL_COMMUNITY)
Admission: AD | Admit: 2018-09-25 | Discharge: 2018-09-25 | Disposition: A | Discharge status: Home / Self Care | Payer: 59 | Source: Skilled Nursing Facility | Attending: Urology | Admitting: Urology

## 2018-09-25 ENCOUNTER — Ambulatory Visit (INDEPENDENT_AMBULATORY_CARE_PROVIDER_SITE_OTHER): Payer: 59 | Admitting: Urology

## 2018-09-25 DIAGNOSIS — R338 Other retention of urine: Secondary | ICD-10-CM

## 2018-09-25 DIAGNOSIS — N401 Enlarged prostate with lower urinary tract symptoms: Secondary | ICD-10-CM

## 2018-09-25 DIAGNOSIS — C61 Malignant neoplasm of prostate: Secondary | ICD-10-CM | POA: Insufficient documentation

## 2018-09-25 DIAGNOSIS — R3914 Feeling of incomplete bladder emptying: Secondary | ICD-10-CM | POA: Diagnosis not present

## 2018-09-26 ENCOUNTER — Other Ambulatory Visit (HOSPITAL_COMMUNITY): Payer: Self-pay | Admitting: Hematology

## 2018-09-26 ENCOUNTER — Other Ambulatory Visit (HOSPITAL_COMMUNITY): Payer: Self-pay

## 2018-09-26 ENCOUNTER — Ambulatory Visit (HOSPITAL_COMMUNITY): Payer: Self-pay

## 2018-09-27 ENCOUNTER — Ambulatory Visit (INDEPENDENT_AMBULATORY_CARE_PROVIDER_SITE_OTHER): Payer: Medicare HMO

## 2018-09-27 ENCOUNTER — Other Ambulatory Visit: Payer: Self-pay | Admitting: Urology

## 2018-09-27 ENCOUNTER — Other Ambulatory Visit (HOSPITAL_COMMUNITY): Payer: Self-pay | Admitting: *Deleted

## 2018-09-27 ENCOUNTER — Ambulatory Visit (HOSPITAL_COMMUNITY): Payer: Self-pay

## 2018-09-27 ENCOUNTER — Other Ambulatory Visit (HOSPITAL_COMMUNITY): Payer: Self-pay

## 2018-09-27 VITALS — BP 108/61 | HR 81 | Temp 97.3°F | Ht 66.0 in | Wt 193.0 lb

## 2018-09-27 DIAGNOSIS — Z23 Encounter for immunization: Secondary | ICD-10-CM | POA: Diagnosis not present

## 2018-09-27 DIAGNOSIS — C61 Malignant neoplasm of prostate: Secondary | ICD-10-CM

## 2018-09-27 DIAGNOSIS — Z0000 Encounter for general adult medical examination without abnormal findings: Secondary | ICD-10-CM

## 2018-09-27 NOTE — Patient Instructions (Signed)
  Walter Boone , Thank you for taking time to come for your Medicare Wellness Visit. I appreciate your ongoing commitment to your health goals. Please review the following plan we discussed and let me know if I can assist you in the future.   These are the goals we discussed: Goals    . Exercise 3x per week (30 min per time)    . Have 3 meals a day       This is a list of the screening recommended for you and due dates:  Health Maintenance  Topic Date Due  .  Hepatitis C: One time screening is recommended by Center for Disease Control  (CDC) for  adults born from [birth year] through 1963.   1951-04-17  . HIV Screening  05/26/1968  . Stool Blood Test  07/03/2013  . Pneumonia vaccines (1 of 2 - PCV13) 05/31/2019*  . Colon Cancer Screening  11/11/2018  . Tetanus Vaccine  11/08/2026  . Flu Shot  Completed  *Topic was postponed. The date shown is not the original due date.

## 2018-09-27 NOTE — Progress Notes (Signed)
Subjective:   WINFRED IIAMS is a 67 y.o. male who presents for Medicare Annual/Subsequent preventive examination. He currently resides in [place] with his wife and his 78 year old son. He has a total of four children but the others have married and moved out. His youngest son lives upstairs in their home. He is currently disabled but worked with peer support at [facility] before then. He enjoys fishing, riding his dog around on the golf cart, and before he became disabled, hunting. He is currently taking treatments for prostate cancer and just feels too weak to do much of anything. They have one dog and one cat. He states that his suprapubic pain in un measurable at times and he only takes percocet when he absolutely has too. He tries to take Tramadol and IBU 800mg  instead. After checking with the [facility] and updating his immunization record he appears to be up to date at this time. He requested a shingrix vaccine today and it was given.   Review of Systems:   Cardiac Risk Factors include: advanced age (>86men, >60 women);dyslipidemia;hypertension;male gender;obesity (BMI >30kg/m2);smoking/ tobacco exposure     Objective:    Vitals: BP 108/61   Pulse 81   Temp (!) 97.3 F (36.3 C) (Oral)   Ht 5\' 6"  (1.676 m)   Wt 193 lb (87.5 kg)   BMI 31.15 kg/m   Body mass index is 31.15 kg/m.  Advanced Directives 09/27/2018 09/02/2018 08/30/2018 07/31/2018 07/29/2018 06/26/2018 04/27/2018  Does Patient Have a Medical Advance Directive? No No No No No No No  Would patient like information on creating a medical advance directive? No - Patient declined - No - Patient declined No - Patient declined No - Patient declined No - Patient declined No - Patient declined  Pre-existing out of facility DNR order (yellow form or pink MOST form) - - - - - - -   Patient currently does not have an advanced directive and declines information for one.   Tobacco Social History   Tobacco Use  Smoking Status Former Smoker    . Packs/day: 2.50  . Years: 3.00  . Pack years: 7.50  Smokeless Tobacco Never Used     Counseling given: Not Answered   Clinical Intake:  Pre-visit preparation completed: No  Pain : 0-10 Pain Score: 10-Worst pain ever Pain Type: Chronic pain Pain Orientation: Mid Pain Onset: More than a month ago Pain Frequency: Occasional Pain Relieving Factors: Takes IBU 800 and Tramadol prn  Pain Relieving Factors: Takes IBU 800 and Tramadol prn  BMI - recorded: 31.1 Nutritional Status: BMI > 30  Obese Nutritional Risks: None Diabetes: No  How often do you need to have someone help you when you read instructions, pamphlets, or other written materials from your doctor or pharmacy?: 1 - Never What is the last grade level you completed in school?: 5th grade but went back later and obtained GED  Interpreter Needed?: No  Information entered by :: Theodoro Clock LPN  Past Medical History:  Diagnosis Date  . Acid reflux   . Alcoholism ([place])   . Arthritis   . Fatty liver   . Gynecomastia, male 01/11/2013   Secondary to prostate ca Tx.   . H/O Bell's palsy   . Hemorrhoids    history  . High cholesterol   . History of back injury 11/19/2013  . Hyperglycemia   . Hyperlipidemia   . Hypertension   . Prostate cancer ([place])   . Prostate cancer ([place]) 06/20/2011  .  Prostate carcinoma, recurrent ([place]) 08/10/2012   Past Surgical History:  Procedure Laterality Date  . [place]   left  . COLONOSCOPY  2008  . ESOPHAGOGASTRODUODENOSCOPY    . HAND SURGERY     nerve was torn - left hand  . HERNIA REPAIR  2010  . I&D EXTREMITY Left 06/10/2015   Procedure: IRRIGATION AND DEBRIDEMENT EXTREMITY LEFT HAND EXPLORATION, nerve repair;  Surgeon: Charlotte Crumb, MD;  Location: [facility];  Service: Orthopedics;  Laterality: Left;  . KNEE SURGERY     left  . PROSTATE BIOPSY  11/06   Family History  Problem Relation Age of Onset  . Heart failure Mother   . Diabetes Father   . Cancer Sister    . Diabetes Brother    Social History   Socioeconomic History  . Marital status: Married    Spouse name: Not on file  . Number of children: 4  . Years of education: Not on file  . Highest education level: GED or equivalent  Occupational History  . Occupation: Disabled  Social Needs  . Financial resource strain: Not very hard  . Food insecurity:    Worry: Sometimes true    Inability: Sometimes true  . Transportation needs:    Medical: No    Non-medical: No  Tobacco Use  . Smoking status: Former Smoker    Packs/day: 2.50    Years: 3.00    Pack years: 7.50  . Smokeless tobacco: Never Used  Substance and Sexual Activity  . Alcohol use: No    Comment: former drinker 20 years ago  . Drug use: No  . Sexual activity: Not Currently  Lifestyle  . Physical activity:    Days per week: 0 days    Minutes per session: 0 min  . Stress: Not at all  Relationships  . Social connections:    Talks on phone: More than three times a week    Gets together: More than three times a week    Attends religious service: Not on file    Active member of club or organization: Not on file    Attends meetings of clubs or organizations: Not on file    Relationship status: Married  Other Topics Concern  . Not on file  Social History Narrative  . Not on file    Outpatient Encounter Medications as of 09/27/2018  Medication Sig  . amLODipine (NORVASC) 10 MG tablet Take 1 tablet (10 mg total) by mouth daily.  . Calcium-Magnesium-Vitamin D (CALCIUM 1200+D3 PO) Take 3 tablets by mouth daily.   . Denosumab (XGEVA [place]) Inject into the skin every 30 (thirty) days.   . diclofenac sodium (VOLTAREN) 1 % GEL Apply 2 g topically 4 (four) times daily.  Marland Kitchen ibuprofen (ADVIL,MOTRIN) 800 MG tablet Take 1 tablet (800 mg total) by mouth every 8 (eight) hours as needed.  . lactulose (CHRONULAC) 10 GM/15ML solution Take 15 mLs (10 g total) by mouth 2 (two) times daily as needed for mild constipation.  Marland Kitchen Leuprolide  Acetate ([name] IJ) Inject as directed every 3 (three) months.  . Menthol-Methyl Salicylate (MUSCLE RUB) 10-15 % CREA Apply 1 application topically as needed for muscle pain.  . mirtazapine (REMERON) 15 MG tablet Take 1 tablet (15 mg total) by mouth at bedtime.  Marland Kitchen oxyCODONE-acetaminophen (PERCOCET/ROXICET) 5-325 MG tablet Take 1 tablet by mouth every 12 (twelve) hours as needed for severe pain.  . polyethylene glycol (MIRALAX) packet Take 17 g by mouth daily.  Marland Kitchen  tamsulosin (FLOMAX) 0.4 MG CAPS capsule Take 1 capsule (0.4 mg total) by mouth 2 (two) times daily.  . traMADol (ULTRAM) 50 MG tablet Take 2 tablets (100 mg total) by mouth every 4 (four) hours as needed.   No facility-administered encounter medications on file as of 09/27/2018.     Activities of Daily Living In your present state of health, do you have any difficulty performing the following activities: 09/27/2018  Hearing? Y  Comment Sometimes  Vision? Y  Comment Wears glasses all the time  Difficulty concentrating or making decisions? N  Walking or climbing stairs? N  Dressing or bathing? N  Doing errands, shopping? N  Preparing Food and eating ? N  Using the Toilet? N  In the past six months, have you accidently leaked urine? N  Do you have problems with loss of bowel control? N  Managing your Medications? N  Managing your Finances? N  Housekeeping or managing your Housekeeping? N  Some recent data might be hidden    Patient Care Team: Dettinger, Fransisca Kaufmann, MD as PCP - General (Family Medicine) Sheldon Silvan, Manon Hilding, PA-C as Physician Assistant (Oncology) Marybelle Killings, MD as Consulting Physician (Orthopedic Surgery)   Assessment:   This is a routine wellness examination for [place].  Exercise Activities and Dietary recommendations Current Exercise Habits: The patient does not participate in regular exercise at present, Exercise limited by: Other - see comments(Patient currently taking cancer treatment)  Goals    .  Exercise 3x per week (30 min per time)    . Have 3 meals a day       Fall Risk Fall Risk  09/27/2018 09/06/2018 08/02/2018 07/13/2018 05/30/2018  Falls in the past year? Yes Yes Yes Yes Yes  Number falls in past yr: 2 or more 2 or more 1 1 -  Injury with Fall? No No No No -  Comment - - - - -  Risk Factor Category  - - - - -  Comment - - - - -  Risk for fall due to : - - - - -  Risk for fall due to: Comment - - - - -   Is the patient's home free of loose throw rugs in walkways, pet beds, electrical cords, etc?   yes      Grab bars in the bathroom? no      Handrails on the stairs?   yes      Adequate lighting?   yes    Depression Screen PHQ 2/9 Scores 09/27/2018 09/06/2018 08/02/2018 07/13/2018  PHQ - 2 Score 1 2 0 3  PHQ- 9 Score 5 8 - 7    Cognitive Function MMSE - Mini Mental State Exam 09/27/2018 05/25/2017  Orientation to time 5 5  Orientation to Place 5 5  Registration 3 3  Attention/ Calculation 5 5  Recall 3 3  Language- name 2 objects 2 2  Language- repeat 1 1  Language- follow 3 step command 3 3  Language- read & follow direction 1 1  Write a sentence 1 1  Copy design 1 1  Total score 30 30    Patient alert and oriented x3. No problem with MMSE    Immunization History  Administered Date(s) Administered  . DTaP 06/10/2015  . Influenza,inj,Quad PF,6+ Mos 10/29/2014, 09/12/2016, 09/19/2017, 08/30/2018  . Pneumococcal Polysaccharide-23 11/02/2015  . Tdap 11/08/2016  . Zoster 10/12/2015    Qualifies for Shingles Vaccine? First dose given today per patients request  Screening Tests  Health Maintenance  Topic Date Due  . Hepatitis C Screening  Aug 14, 1951  . HIV Screening  05/26/1968  . COLON CANCER SCREENING ANNUAL FOBT  07/03/2013  . PNA vac Low Risk Adult (1 of 2 - PCV13) 05/31/2019 (Originally 05/26/2018)  . COLONOSCOPY  11/11/2018  . TETANUS/TDAP  11/08/2026  . INFLUENZA VACCINE  Completed   Cancer Screenings: Lung: Low Dose CT Chest recommended if Age  59-80 years, 30 pack-year currently smoking OR have quit w/in 15years. Patient does not qualify. Colorectal: Patient is due 11/11/2018  Additional Screenings:  Hepatitis C Screening:  Will obtain at next blood draw at regular follow up appt      Plan:     I have personally reviewed and noted the following in the patient's chart:   . Medical and social history . Use of alcohol, tobacco or illicit drugs  . Current medications and supplements . Functional ability and status . Nutritional status . Physical activity . Advanced directives . List of other physicians . Hospitalizations, surgeries, and ER visits in previous 12 months . Vitals . Screenings to include cognitive, depression, and falls . Referrals and appointments  In addition, I have reviewed and discussed with patient certain preventive protocols, quality metrics, and best practice recommendations. A written personalized care plan for preventive services as well as general preventive health recommendations were provided to patient.     Rolena Infante, LPN  09/40/7680

## 2018-09-28 ENCOUNTER — Inpatient Hospital Stay (HOSPITAL_COMMUNITY): Payer: Medicare HMO

## 2018-09-28 ENCOUNTER — Other Ambulatory Visit: Payer: Self-pay

## 2018-09-28 ENCOUNTER — Encounter (HOSPITAL_COMMUNITY): Payer: Self-pay | Admitting: Oncology

## 2018-09-28 ENCOUNTER — Inpatient Hospital Stay (HOSPITAL_COMMUNITY): Payer: Medicare HMO | Attending: Internal Medicine | Admitting: Oncology

## 2018-09-28 VITALS — BP 130/65 | HR 93 | Temp 98.0°F | Resp 18 | Wt 190.4 lb

## 2018-09-28 DIAGNOSIS — M8580 Other specified disorders of bone density and structure, unspecified site: Secondary | ICD-10-CM | POA: Insufficient documentation

## 2018-09-28 DIAGNOSIS — R0609 Other forms of dyspnea: Secondary | ICD-10-CM

## 2018-09-28 DIAGNOSIS — R5383 Other fatigue: Secondary | ICD-10-CM | POA: Diagnosis not present

## 2018-09-28 DIAGNOSIS — Z87891 Personal history of nicotine dependence: Secondary | ICD-10-CM | POA: Insufficient documentation

## 2018-09-28 DIAGNOSIS — R531 Weakness: Secondary | ICD-10-CM | POA: Insufficient documentation

## 2018-09-28 DIAGNOSIS — D539 Nutritional anemia, unspecified: Secondary | ICD-10-CM

## 2018-09-28 DIAGNOSIS — K2100 Gastro-esophageal reflux disease with esophagitis, without bleeding: Secondary | ICD-10-CM

## 2018-09-28 DIAGNOSIS — D649 Anemia, unspecified: Secondary | ICD-10-CM | POA: Insufficient documentation

## 2018-09-28 DIAGNOSIS — D61818 Other pancytopenia: Secondary | ICD-10-CM | POA: Insufficient documentation

## 2018-09-28 DIAGNOSIS — M1990 Unspecified osteoarthritis, unspecified site: Secondary | ICD-10-CM | POA: Insufficient documentation

## 2018-09-28 DIAGNOSIS — C61 Malignant neoplasm of prostate: Secondary | ICD-10-CM | POA: Insufficient documentation

## 2018-09-28 DIAGNOSIS — R42 Dizziness and giddiness: Secondary | ICD-10-CM | POA: Diagnosis not present

## 2018-09-28 DIAGNOSIS — R627 Adult failure to thrive: Secondary | ICD-10-CM | POA: Diagnosis not present

## 2018-09-28 DIAGNOSIS — I10 Essential (primary) hypertension: Secondary | ICD-10-CM | POA: Diagnosis not present

## 2018-09-28 DIAGNOSIS — R161 Splenomegaly, not elsewhere classified: Secondary | ICD-10-CM | POA: Diagnosis not present

## 2018-09-28 DIAGNOSIS — Z79899 Other long term (current) drug therapy: Secondary | ICD-10-CM | POA: Insufficient documentation

## 2018-09-28 DIAGNOSIS — R5381 Other malaise: Secondary | ICD-10-CM | POA: Diagnosis not present

## 2018-09-28 DIAGNOSIS — R110 Nausea: Secondary | ICD-10-CM | POA: Insufficient documentation

## 2018-09-28 DIAGNOSIS — R6883 Chills (without fever): Secondary | ICD-10-CM | POA: Diagnosis not present

## 2018-09-28 DIAGNOSIS — R61 Generalized hyperhidrosis: Secondary | ICD-10-CM | POA: Diagnosis not present

## 2018-09-28 DIAGNOSIS — N62 Hypertrophy of breast: Secondary | ICD-10-CM | POA: Insufficient documentation

## 2018-09-28 DIAGNOSIS — C7951 Secondary malignant neoplasm of bone: Secondary | ICD-10-CM | POA: Insufficient documentation

## 2018-09-28 DIAGNOSIS — Z9221 Personal history of antineoplastic chemotherapy: Secondary | ICD-10-CM | POA: Diagnosis not present

## 2018-09-28 DIAGNOSIS — K5900 Constipation, unspecified: Secondary | ICD-10-CM | POA: Diagnosis not present

## 2018-09-28 DIAGNOSIS — E7800 Pure hypercholesterolemia, unspecified: Secondary | ICD-10-CM | POA: Diagnosis not present

## 2018-09-28 DIAGNOSIS — Z79818 Long term (current) use of other agents affecting estrogen receptors and estrogen levels: Secondary | ICD-10-CM | POA: Insufficient documentation

## 2018-09-28 DIAGNOSIS — Z923 Personal history of irradiation: Secondary | ICD-10-CM | POA: Diagnosis not present

## 2018-09-28 LAB — COMPREHENSIVE METABOLIC PANEL
ALBUMIN: 3.6 g/dL (ref 3.5–5.0)
ALT: 13 U/L (ref 0–44)
ANION GAP: 8 (ref 5–15)
AST: 21 U/L (ref 15–41)
Alkaline Phosphatase: 110 U/L (ref 38–126)
BUN: 18 mg/dL (ref 8–23)
CHLORIDE: 109 mmol/L (ref 98–111)
CO2: 22 mmol/L (ref 22–32)
Calcium: 8.5 mg/dL — ABNORMAL LOW (ref 8.9–10.3)
Creatinine, Ser: 0.93 mg/dL (ref 0.61–1.24)
GFR calc Af Amer: >60 mL/min (ref >60)
Glucose, Bld: 156 mg/dL — ABNORMAL HIGH (ref 70–99)
POTASSIUM: 3.8 mmol/L (ref 3.5–5.1)
Sodium: 139 mmol/L (ref 135–145)
TOTAL PROTEIN: 7 g/dL (ref 6.5–8.1)
Total Bilirubin: 0.5 mg/dL (ref 0.3–1.2)

## 2018-09-28 LAB — CBC WITH DIFFERENTIAL/PLATELET
ABS IMMATURE GRANULOCYTES: 0.79 10*3/uL — AB (ref 0.00–0.07)
BASOS ABS: 0.1 10*3/uL (ref 0.0–0.1)
Basophils Relative: 1 %
Eosinophils Absolute: 0.2 10*3/uL (ref 0.0–0.5)
Eosinophils Relative: 2 %
HEMATOCRIT: 23.3 % — AB (ref 39.0–52.0)
Hemoglobin: 6.9 g/dL — CL (ref 13.0–17.0)
Immature Granulocytes: 11 %
LYMPHS ABS: 1.4 10*3/uL (ref 0.7–4.0)
Lymphocytes Relative: 20 %
MCH: 30 pg (ref 26.0–34.0)
MCHC: 29.6 g/dL — AB (ref 30.0–36.0)
MCV: 101.3 fL — AB (ref 80.0–100.0)
Monocytes Absolute: 0.6 10*3/uL (ref 0.1–1.0)
Monocytes Relative: 8 %
NEUTROS PCT: 58 %
NRBC: 8.4 % — AB (ref 0.0–0.2)
Neutro Abs: 4 10*3/uL (ref 1.7–7.7)
PLATELETS: 96 10*3/uL — AB (ref 150–400)
RBC: 2.3 MIL/uL — AB (ref 4.22–5.81)
RDW: 17.4 % — AB (ref 11.5–15.5)
WBC: 7 10*3/uL (ref 4.0–10.5)

## 2018-09-28 LAB — URINE CULTURE

## 2018-09-28 LAB — PSA: PROSTATIC SPECIFIC ANTIGEN: 254 ng/mL — AB (ref 0.00–4.00)

## 2018-09-28 NOTE — Patient Instructions (Signed)
[place] at [facility] Discharge Instructions  2 unit blood transfusion. Return in 1-2 weeks for follow-up with labs and considered for bone marrow aspiration and biopsy.   Thank you for choosing [place] at [facility] to provide your oncology and hematology care.  To afford each patient quality time with our provider, please arrive at least 15 minutes before your scheduled appointment time.   If you have a lab appointment with the [facility] please come in thru the  Main Entrance and check in at the main information desk  You need to re-schedule your appointment should you arrive 10 or more minutes late.  We strive to give you quality time with our providers, and arriving late affects you and other patients whose appointments are after yours.  Also, if you no show three or more times for appointments you may be dismissed from the clinic at the providers discretion.     Again, thank you for choosing [facility].  Our hope is that these requests will decrease the amount of time that you wait before being seen by our physicians.       _____________________________________________________________  Should you have questions after your visit to [facility], please contact our office at [phone] between the hours of 8:00 a.m. and 4:30 p.m.  Voicemails left after 4:00 p.m. will not be returned until the following business day.  For prescription refill requests, have your pharmacy contact our office and allow 72 hours.    Cancer Center Support Programs:   > Cancer Support Group  2nd Tuesday of the month 1pm-2pm, Journey Room

## 2018-09-28 NOTE — Progress Notes (Signed)
CRITICAL VALUE ALERT Critical value received:  Hemoglobin 6.9 Date of notification:  09-28-2018 Time of notification: 1000 Critical value read back:  Yes.   Nurse who received alert:  C.Keontae Levingston RN MD notified (1st Gaby Harney):  Tom Kefalas PA-C  No injections today per Kirby Crigler PA-C.

## 2018-09-28 NOTE — Assessment & Plan Note (Addendum)
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 He is S/P EBRT in 2007 with biochemical recurrence in 2012 resulting in the initiation of Depo-[name]. Casodex was added in 2015 and discontinued in 2018 due to progressive disease. He started Zytiga + Prednisone in 12/2017 but treatment was complicated by nausea and severe weakness resulting in poor compliance. Progression of disease noted on CT imaging on 04/27/2018 showing worsening bone metastases and rising PSA to 140. He started a Zytiga + Prednisone titration (500 mg to 750 mg). Future treatment options include enzalutamide or chemotherapy (Docetaxel/Jevtana).  He self discontinued Zytiga/prednisone in August 2019 due to intolerance.  Labs today: CBC diff, CMET, PSA.  I personally reviewed and went over laboratory results with the patient.  The results are noted within this dictation.  Blood counts today demonstrate a white blood cell count of 7.0 with a mild left shift, 1-5% metas, occasional myelo, occasional band, increased nucleated red blood cells, 0.79 immature absolute granulocytes, positive teardrops, and positive polychromasia.  Hemoglobin 6.9 g/dL with an elevated MCV at 101.3.  Thrombocytopenia at 96,000.  Metabolic panel demonstrates a mild hypocalcemia.  PSA is in process.  I have added a pathology smear review to blood work today.  Previous anemia panel was negative for obvious vitamin deficiency.  I will hold treatment today.  Given his severe anemia, I have recommended a 2 unit packed red blood cell transfusion.  He declines at this time.  He will reconsider and call us if he changes his mind.  I strongly urged him to reconsider.  Given his significant blood count changes in the setting of macrocytosis, he requires a bone marrow aspiration and biopsy.  I am concerned of a number of things including bone marrow involvement of his prostate cancer (myelophthisis),  MDS/leukemia.  Indications for bone marrow aspiration and biopsy:  A. Unexplained anemia  B. Macrocytic anemia(to distinguish megaloblastic from normoblastic maturation)  C. Unexplained leukopenia  D. Unexplained thrombocytopenia  E. Pancytopenia  F. Presence of blasts on peripheral smear (investigation for possible leukemia)  G. Presence of teardrop red cells on peripheral smear (possible myelofibrosis)  H. Presence of hairy cells on peripheral smear (possible hairy cell leukemia)  I. Suspected Multiple Myeloma  J. Staging for non-Hodgkin's Lymphoma  K. Unexplained splenomegaly (possible splenomegaly)  L. Suspected storage disease (eg: Gaucher disease, Niemann-Pick)  M. Fever of unknown origin  N. Suspected chromosomal disorders in neonates (requiring rapid confirmation).  O. Confirmation of normal marrow in potential allogenic donor  P. Work-up for amyloidosis (to detect clonal plasma cell disorder)  I went over the the procedure of a bone marrow aspiration and biopsy.  I explained that we prep and cleanse the skin to decrease the risk of infection.  We then numb the skin superficial to the PSIS.  This will likely cause a stinging sensation that is short-lived.  After numbing the skin, a tract all the way to the bone or PSIS is numbed.  After waiting a few moments for the lidocaine to take full effect, a needle is inserted through the numbed tract and through the bone.  Once through the bone, an aspirate is performed.  A "shock" sensation will be felt down the lower extremity of the side of the hip that the procedure is being performed on. This is caused by the negative pressure created by the pulling back of the syringe to obtain blood from the bone marrow.  When this blood is collected and the  negative pressure is stopped, the "shock" sensation subsides.  Then a biopsy is performed of the bone and its marrow.  The products from this procedure are then analyzed and the results are reported 1-2  weeks later. Following the procedure, a pressure bandage will be applied to the site and the patient will remain in the procedure room for 30 minutes. The patient will then be discharged.  The risks, benefits, and alternatives of the procedure were explained.  The risks include infection, pain, minimal blood loss, and discomfort following the procedure.  The patient expresses understanding and asks appropriate questions.  All questions regarding the procedure were answered.  He will return in 1 week for bone marrow aspiration and biopsy in the clinic.  He declines referral to IR in [place] for bone marrow biopsy.  At that time, will defer role of Depo-[name] injection to attending medical oncologist.  I think at this time, is most important to work-up his macrocytic anemia.  Since he is noncompliant with his treatment regimen and given his castration resistant disease, I think holding Depo-[name] at this moment will not be detrimental to patient's care and management of prostate cancer.

## 2018-09-28 NOTE — Progress Notes (Signed)
t       Dettinger, Fransisca Kaufmann, MD [place] [id number]  Primary prostate cancer with metastasis from prostate to other site [facility])  Macrocytic anemia - Plan: Pathologist smear review, Pathologist smear review  Osteopenia determined by x-ray  Failure to thrive in adult  Gynecomastia, male  Essential hypertension  Gastroesophageal reflux disease with esophagitis   HISTORY OF PRESENT ILLNESS: Stage IV adenocarcinoma of prostate with osseous involvement, castration refractory, initially diagnosed in 2005. He is S/P EBRT in 2007 with biochemical recurrence in 2012 resulting in the initiation of Depo-[name]. Casodex was added in 2015 and discontinued in 2018 due to progressive disease. He started Zytiga + Prednisone in 12/2017 but treatment was complicated by nausea and severe weakness resulting in poor compliance. Progression of disease noted on CT imaging on 04/27/2018 showing worsening bone metastases and rising PSA to 140. He started a Zytiga + Prednisone titration (500 mg to 750 mg).  Future treatment options include enzalutamide or chemotherapy (Docetaxel/Jevtana). AND Osseous involvement of disease.  On monthly Xgeva- currently on HOLD due to lower jaw/teeth discomfort.  Will restart when cleared by dentist.  CURRENT THERAPY: Patient self-discontinued Zytiga + Prednisone in 07/2018 due to poor tolerance.  CURRENT STATUS: Walter Boone 67 y.o. male returns for followup of metastatic prostate cancer to bone.  He is now developed a macrocytic anemia that is new and progressive.  Anemia studies were performed approximately 3-4 weeks ago which were unimpressive.  He admits to fatigue and tiredness.  He notes shortness of breath with exertion.  He denies significant chest pain.  He admits to dizziness, fatigue, easy bruising, easy bleeding.  He continues to self catheter himself for urine.  He notes minimal bleeding associated with this and he denies any gross hematuria.  He  denies blood in his stools or black stools.  He admits to dizziness and I suspect this is secondary to his anemia.  He notes intermittent constipation as well.  He denies any new lumps or bumps on his own examination.  He denies any new pain.  His appetite is much improved since discontinuing Zytiga/prednisone.  Interestingly, he admits to "drenching" at night sweats only affecting his posterior head and neck.  He denies any abdominal discomfort, bloating, early satiety.  He denies any fevers, chills.  Review of Systems  Constitutional: Positive for malaise/fatigue. Negative for chills, fever and weight loss.  HENT: Negative.   Eyes: Negative.   Respiratory: Negative.  Negative for cough.   Cardiovascular: Negative.  Negative for chest pain.  Gastrointestinal: Positive for constipation. Negative for blood in stool, diarrhea, melena, nausea and vomiting.  Genitourinary: Negative.   Musculoskeletal: Negative.   Skin: Negative.   Neurological: Positive for dizziness. Negative for weakness.  Endo/Heme/Allergies: Negative.   Psychiatric/Behavioral: Negative.     Past Medical History:  Diagnosis Date  . Acid reflux   . Alcoholism ([place])   . Arthritis   . Fatty liver   . Gynecomastia, male 01/11/2013   Secondary to prostate ca Tx.   . H/O Bell's palsy   . Hemorrhoids    history  . High cholesterol   . History of back injury 11/19/2013  . Hyperglycemia   . Hyperlipidemia   . Hypertension   . Prostate cancer ([place])   . Prostate cancer ([place]) 06/20/2011  . Prostate carcinoma, recurrent ([place]) 08/10/2012    Past Surgical History:  Procedure Laterality Date  . [place]   left  . COLONOSCOPY  2008  . ESOPHAGOGASTRODUODENOSCOPY    . HAND SURGERY     nerve was torn - left hand  . HERNIA REPAIR  2010  . I&D EXTREMITY Left 06/10/2015   Procedure: IRRIGATION AND DEBRIDEMENT EXTREMITY LEFT HAND EXPLORATION, nerve repair;  Surgeon: Charlotte Crumb, MD;  Location: [facility];  Service:  Orthopedics;  Laterality: Left;  . KNEE SURGERY     left  . PROSTATE BIOPSY  11/06    Family History  Problem Relation Age of Onset  . Heart failure Mother   . Diabetes Father   . Cancer Sister   . Diabetes Brother     Social History   Socioeconomic History  . Marital status: Married    Spouse name: Not on file  . Number of children: 4  . Years of education: Not on file  . Highest education level: GED or equivalent  Occupational History  . Occupation: Disabled  Social Needs  . Financial resource strain: Not very hard  . Food insecurity:    Worry: Sometimes true    Inability: Sometimes true  . Transportation needs:    Medical: No    Non-medical: No  Tobacco Use  . Smoking status: Former Smoker    Packs/day: 2.50    Years: 3.00    Pack years: 7.50  . Smokeless tobacco: Never Used  Substance and Sexual Activity  . Alcohol use: No    Comment: former drinker 20 years ago  . Drug use: No  . Sexual activity: Not Currently  Lifestyle  . Physical activity:    Days per week: 0 days    Minutes per session: 0 min  . Stress: Not at all  Relationships  . Social connections:    Talks on phone: More than three times a week    Gets together: More than three times a week    Attends religious service: Not on file    Active member of club or organization: Not on file    Attends meetings of clubs or organizations: Not on file    Relationship status: Married  Other Topics Concern  . Not on file  Social History Narrative  . Not on file     PHYSICAL EXAMINATION  ECOG PERFORMANCE STATUS: 2 - Symptomatic, <50% confined to bed  Vitals:   09/28/18 0938  BP: 130/65  Pulse: 93  Resp: 18  Temp: 98 F (36.7 C)  SpO2: 100%    GENERAL:alert, no distress, well nourished, well developed, comfortable, cooperative and smiling, chronically ill-appearing SKIN: skin color, texture, turgor are normal, no rashes or significant lesions HEAD: Normocephalic, No masses, lesions,  tenderness or abnormalities EYES: normal, EOMI, Conjunctiva are pink and non-injected EARS: External ears normal OROPHARYNX:lips, buccal mucosa, and tongue normal and mucous membranes are moist  NECK: supple, no adenopathy, trachea midline LYMPH:  no palpable lymphadenopathy BREAST:not examined LUNGS: clear to auscultation  HEART: regular rate & rhythm ABDOMEN:abdomen soft, non-tender and normal bowel sounds BACK: Back symmetric, no curvature. EXTREMITIES:less then 2 second capillary refill, no joint deformities, effusion, or inflammation, no skin discoloration, no cyanosis  NEURO: alert & oriented x 3 with fluent speech, no focal motor/sensory deficits, gait normal   LABORATORY DATA: CBC    Component Value Date/Time   WBC 7.0 09/28/2018 0916   RBC 2.30 (L) 09/28/2018 0916   HGB 6.9 (LL) 09/28/2018 0916   HGB 7.3 (LL) 09/06/2018 1101   HCT 23.3 (L) 09/28/2018 0916   HCT 22.4 (L) 09/06/2018 1101   PLT 96 (  L) 09/28/2018 0916   PLT 113 (L) 09/06/2018 1101   MCV 101.3 (H) 09/28/2018 0916   MCV 95 09/06/2018 1101   MCH 30.0 09/28/2018 0916   MCHC 29.6 (L) 09/28/2018 0916   RDW 17.4 (H) 09/28/2018 0916   RDW 16.3 (H) 09/06/2018 1101   LYMPHSABS 1.4 09/28/2018 0916   LYMPHSABS 2.2 09/06/2018 1101   MONOABS 0.6 09/28/2018 0916   EOSABS 0.2 09/28/2018 0916   EOSABS 0.2 09/06/2018 1101   BASOSABS 0.1 09/28/2018 0916   BASOSABS 0.1 09/06/2018 1101      Chemistry      Component Value Date/Time   NA 139 09/28/2018 0916   NA 141 09/06/2018 1101   K 3.8 09/28/2018 0916   CL 109 09/28/2018 0916   CO2 22 09/28/2018 0916   BUN 18 09/28/2018 0916   BUN 19 09/06/2018 1101   CREATININE 0.93 09/28/2018 0916   GLU 97 04/22/2016      Component Value Date/Time   CALCIUM 8.5 (L) 09/28/2018 0916   ALKPHOS 110 09/28/2018 0916   AST 21 09/28/2018 0916   ALT 13 09/28/2018 0916   BILITOT 0.5 09/28/2018 0916   BILITOT 0.3 09/06/2018 1101       RADIOGRAPHIC STUDIES:  No results  found.   PATHOLOGY:    ASSESSMENT AND PLAN:  Primary prostate cancer with metastasis from prostate to other site [facility]) Stage IV adenocarcinoma of prostate with osseous involvement, castration refractory, initially diagnosed in 2005. He is S/P EBRT in 2007 with biochemical recurrence in 2012 resulting in the initiation of Depo-[name]. Casodex was added in 2015 and discontinued in 2018 due to progressive disease. He started Zytiga + Prednisone in 12/2017 but treatment was complicated by nausea and severe weakness resulting in poor compliance. Progression of disease noted on CT imaging on 04/27/2018 showing worsening bone metastases and rising PSA to 140. He started a Zytiga + Prednisone titration (500 mg to 750 mg). Future treatment options include enzalutamide or chemotherapy (Docetaxel/Jevtana).  He self discontinued Zytiga/prednisone in August 2019 due to intolerance.  Labs today: CBC diff, CMET, PSA.  I personally reviewed and went over laboratory results with the patient.  The results are noted within this dictation.  Blood counts today demonstrate a white blood cell count of 7.0 with a mild left shift, 1-5% metas, occasional myelo, occasional band, increased nucleated red blood cells, 0.79 immature absolute granulocytes, positive teardrops, and positive polychromasia.  Hemoglobin 6.9 g/dL with an elevated MCV at 101.3.  Thrombocytopenia at 96,000.  Metabolic panel demonstrates a mild hypocalcemia.  PSA is in process.  I have added a pathology smear review to blood work today.  Previous anemia panel was negative for obvious vitamin deficiency.  I will hold treatment today.  Given his severe anemia, I have recommended a 2 unit packed red blood cell transfusion.  He declines at this time.  He will reconsider and call us if he changes his mind.  I strongly urged him to reconsider.  Given his significant blood count changes in the setting of macrocytosis, he requires a bone marrow aspiration and  biopsy.  I am concerned of a number of things including bone marrow involvement of his prostate cancer (myelophthisis), MDS/leukemia.  Indications for bone marrow aspiration and biopsy:  A. Unexplained anemia  B. Macrocytic anemia(to distinguish megaloblastic from normoblastic maturation)  C. Unexplained leukopenia  D. Unexplained thrombocytopenia  E. Pancytopenia  F. Presence of blasts on peripheral smear (investigation for possible leukemia)  G. Presence of teardrop red  cells on peripheral smear (possible myelofibrosis)  H. Presence of hairy cells on peripheral smear (possible hairy cell leukemia)  I. Suspected Multiple Myeloma  J. Staging for non-Hodgkin's Lymphoma  K. Unexplained splenomegaly (possible splenomegaly)  L. Suspected storage disease (eg: Gaucher disease, Niemann-Pick)  M. Fever of unknown origin  N. Suspected chromosomal disorders in neonates (requiring rapid confirmation).  O. Confirmation of normal marrow in potential allogenic donor  P. Work-up for amyloidosis (to detect clonal plasma cell disorder)  I went over the the procedure of a bone marrow aspiration and biopsy.  I explained that we prep and cleanse the skin to decrease the risk of infection.  We then numb the skin superficial to the PSIS.  This will likely cause a stinging sensation that is short-lived.  After numbing the skin, a tract all the way to the bone or PSIS is numbed.  After waiting a few moments for the lidocaine to take full effect, a needle is inserted through the numbed tract and through the bone.  Once through the bone, an aspirate is performed.  A "shock" sensation will be felt down the lower extremity of the side of the hip that the procedure is being performed on. This is caused by the negative pressure created by the pulling back of the syringe to obtain blood from the bone marrow.  When this blood is collected and the negative pressure is stopped, the "shock" sensation subsides.  Then a biopsy is  performed of the bone and its marrow.  The products from this procedure are then analyzed and the results are reported 1-2 weeks later. Following the procedure, a pressure bandage will be applied to the site and the patient will remain in the procedure room for 30 minutes. The patient will then be discharged.  The risks, benefits, and alternatives of the procedure were explained.  The risks include infection, pain, minimal blood loss, and discomfort following the procedure.  The patient expresses understanding and asks appropriate questions.  All questions regarding the procedure were answered.  He will return in 1 week for bone marrow aspiration and biopsy in the clinic.  He declines referral to IR in [place] for bone marrow biopsy.  At that time, will defer role of Depo-[name] injection to attending medical oncologist.  I think at this time, is most important to work-up his macrocytic anemia.  Since he is noncompliant with his treatment regimen and given his castration resistant disease, I think holding Depo-[name] at this moment will not be detrimental to patient's care and management of prostate cancer.    2. Macrocytic anemia New.  Pathology smear review is ordered.  He will require bone marrow aspiration biopsy in very near future. - Pathologist smear review; Future - Pathologist smear review  3. Osteopenia determined by x-ray Noted.  Will hold Xgeva today for hypocalcemia of 8.5 with normal albumin.  4. Failure to thrive in adult Noted.  5. Gynecomastia, male Secondary Depo-[name]  6. Essential hypertension .  Blood pressure today is 130/65.  Well control  7. Gastroesophageal reflux disease with esophagitis Well-controlled.  Not currently on PPI therapy.    ORDERS PLACED FOR THIS ENCOUNTER: Orders Placed This Encounter  Procedures  . Pathologist smear review    MEDICATIONS PRESCRIBED THIS ENCOUNTER: No orders of the defined types were placed in this encounter.   THERAPY PLAN:    Holding all therapy today.  We will proceed with bone marrow aspiration and biopsy.  Will reinstitute Depo-[name] following bone marrow aspiration biopsy.  All questions were answered. The patient knows to call the clinic with any problems, questions or concerns. We can certainly see the patient much sooner if necessary.  Patient and plan discussed with Dr. [name] and she is in agreement with the aforementioned.   This note is electronically signed by: Robynn Pane, PA-C 09/28/2018 11:08 AM

## 2018-10-01 ENCOUNTER — Other Ambulatory Visit (HOSPITAL_COMMUNITY): Payer: Self-pay | Admitting: *Deleted

## 2018-10-01 ENCOUNTER — Inpatient Hospital Stay (HOSPITAL_COMMUNITY): Payer: Medicare HMO

## 2018-10-01 DIAGNOSIS — D61818 Other pancytopenia: Secondary | ICD-10-CM | POA: Diagnosis not present

## 2018-10-01 DIAGNOSIS — R627 Adult failure to thrive: Secondary | ICD-10-CM | POA: Diagnosis not present

## 2018-10-01 DIAGNOSIS — Z9221 Personal history of antineoplastic chemotherapy: Secondary | ICD-10-CM | POA: Diagnosis not present

## 2018-10-01 DIAGNOSIS — C7951 Secondary malignant neoplasm of bone: Secondary | ICD-10-CM | POA: Diagnosis not present

## 2018-10-01 DIAGNOSIS — C61 Malignant neoplasm of prostate: Secondary | ICD-10-CM | POA: Diagnosis not present

## 2018-10-01 DIAGNOSIS — Z79818 Long term (current) use of other agents affecting estrogen receptors and estrogen levels: Secondary | ICD-10-CM | POA: Diagnosis not present

## 2018-10-01 DIAGNOSIS — D539 Nutritional anemia, unspecified: Secondary | ICD-10-CM

## 2018-10-01 DIAGNOSIS — Z923 Personal history of irradiation: Secondary | ICD-10-CM | POA: Diagnosis not present

## 2018-10-01 DIAGNOSIS — D649 Anemia, unspecified: Secondary | ICD-10-CM | POA: Diagnosis not present

## 2018-10-01 LAB — ABO/RH: ABO/RH(D): A NEG

## 2018-10-01 LAB — PREPARE RBC (CROSSMATCH)

## 2018-10-02 LAB — PATHOLOGIST SMEAR REVIEW

## 2018-10-03 ENCOUNTER — Other Ambulatory Visit (HOSPITAL_COMMUNITY): Payer: Self-pay

## 2018-10-03 ENCOUNTER — Encounter (HOSPITAL_COMMUNITY)
Admission: RE | Admit: 2018-10-03 | Discharge: 2018-10-03 | Disposition: A | Discharge status: Home / Self Care | Payer: Medicare HMO | Source: Ambulatory Visit | Attending: Hematology | Admitting: Hematology

## 2018-10-03 ENCOUNTER — Encounter (HOSPITAL_COMMUNITY): Payer: Self-pay

## 2018-10-03 DIAGNOSIS — Z79818 Long term (current) use of other agents affecting estrogen receptors and estrogen levels: Secondary | ICD-10-CM | POA: Diagnosis not present

## 2018-10-03 DIAGNOSIS — Z9221 Personal history of antineoplastic chemotherapy: Secondary | ICD-10-CM | POA: Diagnosis not present

## 2018-10-03 DIAGNOSIS — D61818 Other pancytopenia: Secondary | ICD-10-CM | POA: Diagnosis not present

## 2018-10-03 DIAGNOSIS — D649 Anemia, unspecified: Secondary | ICD-10-CM | POA: Diagnosis not present

## 2018-10-03 DIAGNOSIS — D539 Nutritional anemia, unspecified: Secondary | ICD-10-CM | POA: Diagnosis not present

## 2018-10-03 DIAGNOSIS — C61 Malignant neoplasm of prostate: Secondary | ICD-10-CM

## 2018-10-03 DIAGNOSIS — R627 Adult failure to thrive: Secondary | ICD-10-CM | POA: Diagnosis not present

## 2018-10-03 DIAGNOSIS — Z923 Personal history of irradiation: Secondary | ICD-10-CM | POA: Diagnosis not present

## 2018-10-03 DIAGNOSIS — C7951 Secondary malignant neoplasm of bone: Secondary | ICD-10-CM | POA: Diagnosis not present

## 2018-10-03 MED ORDER — HEPARIN SOD (PORK) LOCK FLUSH 100 UNIT/ML IV SOLN: 500.0000 [IU] | Freq: Every day | INTRAVENOUS | Status: DC | PRN

## 2018-10-03 MED ORDER — SODIUM CHLORIDE 0.9% FLUSH: 10.0000 mL | INTRAVENOUS | Status: DC | PRN

## 2018-10-03 MED ORDER — ACETAMINOPHEN 325 MG PO TABS
650.0000 mg | ORAL_TABLET | Freq: Once | ORAL | Status: AC
  Administered 2018-10-03: 650 mg via ORAL
  Filled 2018-10-03: qty 2

## 2018-10-03 MED ORDER — DIPHENHYDRAMINE HCL 25 MG PO CAPS
25.0000 mg | ORAL_CAPSULE | Freq: Once | ORAL | Status: AC
  Administered 2018-10-03: 25 mg via ORAL
  Filled 2018-10-03: qty 1

## 2018-10-03 MED ORDER — SODIUM CHLORIDE 0.9% IV SOLUTION
250.0000 mL | Freq: Once | INTRAVENOUS | Status: AC
  Administered 2018-10-03: 250 mL via INTRAVENOUS

## 2018-10-04 ENCOUNTER — Ambulatory Visit (HOSPITAL_COMMUNITY): Payer: Self-pay

## 2018-10-04 ENCOUNTER — Ambulatory Visit (HOSPITAL_COMMUNITY): Payer: Self-pay | Admitting: Hematology

## 2018-10-04 LAB — TYPE AND SCREEN
ABO/RH(D): A NEG
ANTIBODY SCREEN: NEGATIVE
UNIT DIVISION: 0
Unit division: 0

## 2018-10-04 LAB — BPAM RBC
Blood Product Expiration Date: 201911122359
Blood Product Expiration Date: 201911132359
ISSUE DATE / TIME: 201910230854
ISSUE DATE / TIME: 201910231106
Unit Type and Rh: 600
Unit Type and Rh: 600

## 2018-10-04 NOTE — Patient Instructions (Signed)
Bone Marrow Aspiration and Bone Marrow Biopsy, Adult, Care After This sheet gives you information about how to care for yourself after your procedure. Your health care provider may also give you more specific instructions. If you have problems or questions, contact your health care provider. What can I expect after the procedure? After the procedure, it is common to have:  Mild pain and tenderness.  Swelling.  Bruising.  Follow these instructions at home:  Take over-the-counter or prescription medicines only as told by your health care provider.  Do not take baths, swim, or use a hot tub until your health care provider approves. Ask if you can take a shower or have a sponge bath.  Follow instructions from your health care provider about how to take care of the puncture site. Make sure you: ? Wash your hands with soap and water before you change your bandage (dressing). If soap and water are not available, use hand sanitizer. ? Change your dressing as told by your health care provider.  Check your puncture siteevery day for signs of infection. Check for: ? More redness, swelling, or pain. ? More fluid or blood. ? Warmth. ? Pus or a bad smell.  Return to your normal activities as told by your health care provider. Ask your health care provider what activities are safe for you.  Do not drive for 24 hours if you were given a medicine to help you relax (sedative).  Keep all follow-up visits as told by your health care provider. This is important. Contact a health care provider if:  You have more redness, swelling, or pain around the puncture site.  You have more fluid or blood coming from the puncture site.  Your puncture site feels warm to the touch.  You have pus or a bad smell coming from the puncture site.  You have a fever.  Your pain is not controlled with medicine. This information is not intended to replace advice given to you by your health care provider. Make sure  you discuss any questions you have with your health care provider. Document Released: 06/17/2005 Document Revised: 06/17/2016 Document Reviewed: 05/11/2016 Elsevier Interactive Patient Education  2018 [employer].

## 2018-10-04 NOTE — Progress Notes (Unsigned)
Patient unaware of bone marrow biopsy.

## 2018-10-08 NOTE — Patient Instructions (Signed)
Walter Boone  10/08/2018     @PREFPERIOPPHARMACY @   Your procedure is scheduled on  10/16/2018   Report to [facility] at  615  A.M.  Call this number if you have problems the morning of surgery:  [phone]   Remember:  Do not eat or drink after midnight.                        Take these medicines the morning of surgery with A SIP OF WATER  Amlodipine, oxycodone or ultram ( if needed), flomax.    Do not wear jewelry, make-up or nail polish.  Do not wear lotions, powders, or perfumes, or deodorant.  Do not shave 48 hours prior to surgery.  Men may shave face and neck.  Do not bring valuables to the hospital.  [facility] is not responsible for any belongings or valuables.  Contacts, dentures or bridgework may not be worn into surgery.  Leave your suitcase in the car.  After surgery it may be brought to your room.  For patients admitted to the hospital, discharge time will be determined by your treatment team.  Patients discharged the day of surgery will not be allowed to drive home.   Name and phone number of your driver:   family Special instructions:  None  Please read over the following fact sheets that you were given. Anesthesia Post-op Instructions and Care and Recovery After Surgery       Cystoscopy Cystoscopy is a procedure that is used to help diagnose and sometimes treat conditions that affect that lower urinary tract. The lower urinary tract includes the bladder and the tube that drains urine from the bladder out of the body (urethra). Cystoscopy is performed with a thin, tube-shaped instrument with a light and camera at the end (cystoscope). The cystoscope may be hard (rigid) or flexible, depending on the goal of the procedure.The cystoscope is inserted through the urethra, into the bladder. Cystoscopy may be recommended if you have:  Urinary tractinfections that keep coming back (recurring).  Blood in the urine (hematuria).  Loss  of bladder control (urinary incontinence) or an overactive bladder.  Unusual cells found in a urine sample.  A blockage in the urethra.  Painful urination.  An abnormality in the bladder found during an intravenous pyelogram (IVP) or CT scan.  Cystoscopy may also be done to remove a sample of tissue to be examined under a microscope (biopsy). Tell a health care provider about:  Any allergies you have.  All medicines you are taking, including vitamins, herbs, eye drops, creams, and over-the-counter medicines.  Any problems you or family members have had with anesthetic medicines.  Any blood disorders you have.  Any surgeries you have had.  Any medical conditions you have.  Whether you are pregnant or may be pregnant. What are the risks? Generally, this is a safe procedure. However, problems may occur, including:  Infection.  Bleeding.  Allergic reactions to medicines.  Damage to other structures or organs.  What happens before the procedure?  Ask your health care provider about: ? Changing or stopping your regular medicines. This is especially important if you are taking diabetes medicines or blood thinners. ? Taking medicines such as aspirin and ibuprofen. These medicines can thin your blood. Do not take these medicines before your procedure if your health care provider instructs you not to.  Follow instructions from your health care  provider about eating or drinking restrictions.  You may be given antibiotic medicine to help prevent infection.  You may have an exam or testing, such as X-rays of the bladder, urethra, or kidneys.  You may have urine tests to check for signs of infection.  Plan to have someone take you home after the procedure. What happens during the procedure?  To reduce your risk of infection,your health care team will wash or sanitize their hands.  You will be given one or more of the following: ? A medicine to help you relax  (sedative). ? A medicine to numb the area (local anesthetic).  The area around the opening of your urethra will be cleaned.  The cystoscope will be passed through your urethra into your bladder.  Germ-free (sterile)fluid will flow through the cystoscope to fill your bladder. The fluid will stretch your bladder so that your surgeon can clearly examine your bladder walls.  The cystoscope will be removed and your bladder will be emptied. The procedure may vary among health care providers and hospitals. What happens after the procedure?  You may have some soreness or pain in your abdomen and urethra. Medicines will be available to help you.  You may have some blood in your urine.  Do not drive for 24 hours if you received a sedative. This information is not intended to replace advice given to you by your health care provider. Make sure you discuss any questions you have with your health care provider. Document Released: 11/25/2000 Document Revised: 04/07/2016 Document Reviewed: 10/15/2015 Elsevier Interactive Patient Education  2018 [employer].  Cystoscopy, Care After Refer to this sheet in the next few weeks. These instructions provide you with information about caring for yourself after your procedure. Your health care provider may also give you more specific instructions. Your treatment has been planned according to current medical practices, but problems sometimes occur. Call your health care provider if you have any problems or questions after your procedure. What can I expect after the procedure? After the procedure, it is common to have:  Mild pain when you urinate. Pain should stop within a few minutes after you urinate. This may last for up to 1 week.  A small amount of blood in your urine for several days.  Feeling like you need to urinate but producing only a small amount of urine.  Follow these instructions at home:  Medicines  Take over-the-counter and prescription  medicines only as told by your health care provider.  If you were prescribed an antibiotic medicine, take it as told by your health care provider. Do not stop taking the antibiotic even if you start to feel better. General instructions   Return to your normal activities as told by your health care provider. Ask your health care provider what activities are safe for you.  Do not drive for 24 hours if you received a sedative.  Watch for any blood in your urine. If the amount of blood in your urine increases, call your health care provider.  Follow instructions from your health care provider about eating or drinking restrictions.  If a tissue sample was removed for testing (biopsy) during your procedure, it is your responsibility to get your test results. Ask your health care provider or the department performing the test when your results will be ready.  Drink enough fluid to keep your urine clear or pale yellow.  Keep all follow-up visits as told by your health care provider. This is important. Contact a  health care provider if:  You have pain that gets worse or does not get better with medicine, especially pain when you urinate.  You have difficulty urinating. Get help right away if:  You have more blood in your urine.  You have blood clots in your urine.  You have abdominal pain.  You have a fever or chills.  You are unable to urinate. This information is not intended to replace advice given to you by your health care provider. Make sure you discuss any questions you have with your health care provider. Document Released: 06/17/2005 Document Revised: 05/05/2016 Document Reviewed: 10/15/2015 Elsevier Interactive Patient Education  2018 [place] Anesthesia, Adult General anesthesia is the use of medicines to make a person "go to sleep" (be unconscious) for a medical procedure. General anesthesia is often recommended when a procedure:  Is long.  Requires you  to be still or in an unusual position.  Is major and can cause you to lose blood.  Is impossible to do without general anesthesia.  The medicines used for general anesthesia are called general anesthetics. In addition to making you sleep, the medicines:  Prevent pain.  Control your blood pressure.  Relax your muscles.  Tell a health care provider about:  Any allergies you have.  All medicines you are taking, including vitamins, herbs, eye drops, creams, and over-the-counter medicines.  Any problems you or family members have had with anesthetic medicines.  Types of anesthetics you have had in the past.  Any bleeding disorders you have.  Any surgeries you have had.  Any medical conditions you have.  Any history of heart or lung conditions, such as heart failure, sleep apnea, or chronic obstructive pulmonary disease (COPD).  Whether you are pregnant or may be pregnant.  Whether you use tobacco, alcohol, marijuana, or street drugs.  Any history of Armed forces logistics/support/administrative officer.  Any history of depression or anxiety. What are the risks? Generally, this is a safe procedure. However, problems may occur, including:  Allergic reaction to anesthetics.  Lung and heart problems.  Inhaling food or liquids from your stomach into your lungs (aspiration).  Injury to nerves.  Waking up during your procedure and being unable to move (rare).  Extreme agitation or a state of mental confusion (delirium) when you wake up from the anesthetic.  Air in the bloodstream, which can lead to stroke.  These problems are more likely to develop if you are having a major surgery or if you have an advanced medical condition. You can prevent some of these complications by answering all of your health care provider's questions thoroughly and by following all pre-procedure instructions. General anesthesia can cause side effects, including:  Nausea or vomiting  A sore throat from the breathing  tube.  Feeling cold or shivery.  Feeling tired, washed out, or achy.  Sleepiness or drowsiness.  Confusion or agitation.  What happens before the procedure? Staying hydrated Follow instructions from your health care provider about hydration, which may include:  Up to 2 hours before the procedure - you may continue to drink clear liquids, such as water, clear fruit juice, black coffee, and plain tea.  Eating and drinking restrictions Follow instructions from your health care provider about eating and drinking, which may include:  8 hours before the procedure - stop eating heavy meals or foods such as meat, fried foods, or fatty foods.  6 hours before the procedure - stop eating light meals or foods, such as toast or cereal.  6 hours before the procedure - stop drinking milk or drinks that contain milk.  2 hours before the procedure - stop drinking clear liquids.  Medicines  Ask your health care provider about: ? Changing or stopping your regular medicines. This is especially important if you are taking diabetes medicines or blood thinners. ? Taking medicines such as aspirin and ibuprofen. These medicines can thin your blood. Do not take these medicines before your procedure if your health care provider instructs you not to. ? Taking new dietary supplements or medicines. Do not take these during the week before your procedure unless your health care provider approves them.  If you are told to take a medicine or to continue taking a medicine on the day of the procedure, take the medicine with sips of water. General instructions   Ask if you will be going home the same day, the following day, or after a longer hospital stay. ? Plan to have someone take you home. ? Plan to have someone stay with you for the first 24 hours after you leave the hospital or clinic.  For 3-6 weeks before the procedure, try not to use any tobacco products, such as cigarettes, chewing tobacco, and  e-cigarettes.  You may brush your teeth on the morning of the procedure, but make sure to spit out the toothpaste. What happens during the procedure?  You will be given anesthetics through a mask and through an IV tube in one of your veins.  You may receive medicine to help you relax (sedative).  As soon as you are asleep, a breathing tube may be used to help you breathe.  An anesthesia specialist will stay with you throughout the procedure. He or she will help keep you comfortable and safe by continuing to give you medicines and adjusting the amount of medicine that you get. He or she will also watch your blood pressure, pulse, and oxygen levels to make sure that the anesthetics do not cause any problems.  If a breathing tube was used to help you breathe, it will be removed before you wake up. The procedure may vary among health care providers and hospitals. What happens after the procedure?  You will wake up, often slowly, after the procedure is complete, usually in a recovery area.  Your blood pressure, heart rate, breathing rate, and blood oxygen level will be monitored until the medicines you were given have worn off.  You may be given medicine to help you calm down if you feel anxious or agitated.  If you will be going home the same day, your health care provider may check to make sure you can stand, drink, and urinate.  Your health care providers will treat your pain and side effects before you go home.  Do not drive for 24 hours if you received a sedative.  You may: ? Feel nauseous and vomit. ? Have a sore throat. ? Have mental slowness. ? Feel cold or shivery. ? Feel sleepy. ? Feel tired. ? Feel sore or achy, even in parts of your body where you did not have surgery. This information is not intended to replace advice given to you by your health care provider. Make sure you discuss any questions you have with your health care provider. Document Released: 03/06/2008  Document Revised: 05/10/2016 Document Reviewed: 11/12/2015 Elsevier Interactive Patient Education  2018 [place] Anesthesia, Adult, Care After These instructions provide you with information about caring for yourself after your procedure. Your health care provider  may also give you more specific instructions. Your treatment has been planned according to current medical practices, but problems sometimes occur. Call your health care provider if you have any problems or questions after your procedure. What can I expect after the procedure? After the procedure, it is common to have:  Vomiting.  A sore throat.  Mental slowness.  It is common to feel:  Nauseous.  Cold or shivery.  Sleepy.  Tired.  Sore or achy, even in parts of your body where you did not have surgery.  Follow these instructions at home: For at least 24 hours after the procedure:  Do not: ? Participate in activities where you could fall or become injured. ? Drive. ? Use heavy machinery. ? Drink alcohol. ? Take sleeping pills or medicines that cause drowsiness. ? Make important decisions or sign legal documents. ? Take care of children on your own.  Rest. Eating and drinking  If you vomit, drink water, juice, or soup when you can drink without vomiting.  Drink enough fluid to keep your urine clear or pale yellow.  Make sure you have little or no nausea before eating solid foods.  Follow the diet recommended by your health care provider. General instructions  Have a responsible adult stay with you until you are awake and alert.  Return to your normal activities as told by your health care provider. Ask your health care provider what activities are safe for you.  Take over-the-counter and prescription medicines only as told by your health care provider.  If you smoke, do not smoke without supervision.  Keep all follow-up visits as told by your health care provider. This is  important. Contact a health care provider if:  You continue to have nausea or vomiting at home, and medicines are not helpful.  You cannot drink fluids or start eating again.  You cannot urinate after 8-12 hours.  You develop a skin rash.  You have fever.  You have increasing redness at the site of your procedure. Get help right away if:  You have difficulty breathing.  You have chest pain.  You have unexpected bleeding.  You feel that you are having a life-threatening or urgent problem. This information is not intended to replace advice given to you by your health care provider. Make sure you discuss any questions you have with your health care provider. Document Released: 03/06/2001 Document Revised: 05/02/2016 Document Reviewed: 11/12/2015 Elsevier Interactive Patient Education  [employer].

## 2018-10-09 ENCOUNTER — Encounter (HOSPITAL_COMMUNITY)
Admission: RE | Admit: 2018-10-09 | Discharge: 2018-10-09 | Disposition: A | Discharge status: Home / Self Care | Payer: Medicare HMO | Source: Ambulatory Visit | Attending: Urology | Admitting: Urology

## 2018-10-09 DIAGNOSIS — Z01812 Encounter for preprocedural laboratory examination: Secondary | ICD-10-CM | POA: Insufficient documentation

## 2018-10-09 LAB — CBC WITH DIFFERENTIAL/PLATELET
BASOS ABS: 0 10*3/uL (ref 0.0–0.1)
BASOS PCT: 0 %
Band Neutrophils: 12 %
EOS PCT: 0 %
Eosinophils Absolute: 0 10*3/uL (ref 0.0–0.5)
HCT: 30.3 % — ABNORMAL LOW (ref 39.0–52.0)
HEMOGLOBIN: 9.2 g/dL — AB (ref 13.0–17.0)
LYMPHS ABS: 3.9 10*3/uL (ref 0.7–4.0)
LYMPHS PCT: 46 %
MCH: 30.1 pg (ref 26.0–34.0)
MCHC: 30.4 g/dL (ref 30.0–36.0)
MCV: 99 fL (ref 80.0–100.0)
MONO ABS: 0.2 10*3/uL (ref 0.1–1.0)
MYELOCYTES: 2 %
Metamyelocytes Relative: 8 %
Monocytes Relative: 2 %
NEUTROS PCT: 30 %
Neutro Abs: 4.4 10*3/uL (ref 1.7–7.7)
Platelets: 108 10*3/uL — ABNORMAL LOW (ref 150–400)
RBC: 3.06 MIL/uL — ABNORMAL LOW (ref 4.22–5.81)
RDW: 16.6 % — AB (ref 11.5–15.5)
WBC: 8.5 10*3/uL (ref 4.0–10.5)
nRBC: 3 % — ABNORMAL HIGH (ref 0.0–0.2)
nRBC: 7 /100 WBC — ABNORMAL HIGH

## 2018-10-11 ENCOUNTER — Encounter (HOSPITAL_COMMUNITY): Payer: Self-pay | Admitting: Hematology

## 2018-10-11 ENCOUNTER — Telehealth (HOSPITAL_COMMUNITY): Payer: Self-pay | Admitting: Pharmacist

## 2018-10-11 ENCOUNTER — Inpatient Hospital Stay (HOSPITAL_BASED_OUTPATIENT_CLINIC_OR_DEPARTMENT_OTHER): Payer: Medicare HMO | Admitting: Hematology

## 2018-10-11 ENCOUNTER — Encounter (HOSPITAL_COMMUNITY): Payer: Self-pay

## 2018-10-11 ENCOUNTER — Inpatient Hospital Stay (HOSPITAL_BASED_OUTPATIENT_CLINIC_OR_DEPARTMENT_OTHER): Payer: Medicare HMO

## 2018-10-11 ENCOUNTER — Telehealth: Payer: Self-pay | Admitting: Pharmacy Technician

## 2018-10-11 ENCOUNTER — Other Ambulatory Visit: Payer: Self-pay

## 2018-10-11 VITALS — BP 129/67 | HR 85 | Temp 98.7°F | Resp 18

## 2018-10-11 DIAGNOSIS — Z79899 Other long term (current) drug therapy: Secondary | ICD-10-CM

## 2018-10-11 DIAGNOSIS — D696 Thrombocytopenia, unspecified: Secondary | ICD-10-CM | POA: Diagnosis not present

## 2018-10-11 DIAGNOSIS — Z923 Personal history of irradiation: Secondary | ICD-10-CM

## 2018-10-11 DIAGNOSIS — K219 Gastro-esophageal reflux disease without esophagitis: Secondary | ICD-10-CM

## 2018-10-11 DIAGNOSIS — C7951 Secondary malignant neoplasm of bone: Secondary | ICD-10-CM | POA: Diagnosis not present

## 2018-10-11 DIAGNOSIS — I10 Essential (primary) hypertension: Secondary | ICD-10-CM

## 2018-10-11 DIAGNOSIS — M1990 Unspecified osteoarthritis, unspecified site: Secondary | ICD-10-CM

## 2018-10-11 DIAGNOSIS — R531 Weakness: Secondary | ICD-10-CM

## 2018-10-11 DIAGNOSIS — D539 Nutritional anemia, unspecified: Secondary | ICD-10-CM

## 2018-10-11 DIAGNOSIS — R42 Dizziness and giddiness: Secondary | ICD-10-CM

## 2018-10-11 DIAGNOSIS — Z87891 Personal history of nicotine dependence: Secondary | ICD-10-CM

## 2018-10-11 DIAGNOSIS — C61 Malignant neoplasm of prostate: Secondary | ICD-10-CM

## 2018-10-11 DIAGNOSIS — R110 Nausea: Secondary | ICD-10-CM

## 2018-10-11 DIAGNOSIS — R627 Adult failure to thrive: Secondary | ICD-10-CM | POA: Diagnosis not present

## 2018-10-11 DIAGNOSIS — R6883 Chills (without fever): Secondary | ICD-10-CM

## 2018-10-11 DIAGNOSIS — Z9221 Personal history of antineoplastic chemotherapy: Secondary | ICD-10-CM | POA: Diagnosis not present

## 2018-10-11 DIAGNOSIS — C7952 Secondary malignant neoplasm of bone marrow: Secondary | ICD-10-CM | POA: Diagnosis not present

## 2018-10-11 DIAGNOSIS — D649 Anemia, unspecified: Secondary | ICD-10-CM | POA: Diagnosis not present

## 2018-10-11 DIAGNOSIS — D61818 Other pancytopenia: Secondary | ICD-10-CM | POA: Diagnosis not present

## 2018-10-11 DIAGNOSIS — Z79818 Long term (current) use of other agents affecting estrogen receptors and estrogen levels: Secondary | ICD-10-CM | POA: Diagnosis not present

## 2018-10-11 DIAGNOSIS — E785 Hyperlipidemia, unspecified: Secondary | ICD-10-CM

## 2018-10-11 LAB — CBC WITH DIFFERENTIAL/PLATELET
Abs Immature Granulocytes: 0.87 10*3/uL — ABNORMAL HIGH (ref 0.00–0.07)
Basophils Absolute: 0.1 10*3/uL (ref 0.0–0.1)
Basophils Relative: 1 %
Eosinophils Absolute: 0.2 10*3/uL (ref 0.0–0.5)
Eosinophils Relative: 3 %
HCT: 29 % — ABNORMAL LOW (ref 39.0–52.0)
Hemoglobin: 9 g/dL — ABNORMAL LOW (ref 13.0–17.0)
IMMATURE GRANULOCYTES: 12 %
LYMPHS ABS: 2.1 10*3/uL (ref 0.7–4.0)
LYMPHS PCT: 27 %
MCH: 30.3 pg (ref 26.0–34.0)
MCHC: 31 g/dL (ref 30.0–36.0)
MCV: 97.6 fL (ref 80.0–100.0)
MONO ABS: 0.8 10*3/uL (ref 0.1–1.0)
Monocytes Relative: 11 %
NEUTROS ABS: 3.5 10*3/uL (ref 1.7–7.7)
Neutrophils Relative %: 46 %
Platelets: 107 10*3/uL — ABNORMAL LOW (ref 150–400)
RBC: 2.97 MIL/uL — AB (ref 4.22–5.81)
RDW: 16.1 % — ABNORMAL HIGH (ref 11.5–15.5)
WBC: 7.5 10*3/uL (ref 4.0–10.5)
nRBC: 3.7 % — ABNORMAL HIGH (ref 0.0–0.2)

## 2018-10-11 MED ORDER — ENZALUTAMIDE 40 MG PO CAPS: 160.0000 mg | ORAL_CAPSULE | Freq: Every day | ORAL | 0 refills | Status: DC

## 2018-10-11 MED ORDER — LIDOCAINE HCL (PF) 1 % IJ SOLN
INTRAMUSCULAR | Status: AC
  Filled 2018-10-11: qty 5

## 2018-10-11 NOTE — Assessment & Plan Note (Signed)
1.  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 - Zytiga and prednisone started in January 2019. -Patient could not tolerate it well secondary to nausea and severe tiredness.  Has not been taking them as prescribed.  He completely discontinued it on May 17 when he went to visit family in [place]. -Last bone scan and CT scan on 04/27/2018 shows worsening bone metastasis with no visceral metastasis. - At last visit in July I have recommended him to go back on Abiraterone.  He started back at lower dose of 500 mg daily and discontinued it in August secondary to intolerance from nausea and severe tiredness. - He was recently evaluated in our clinic and was found to have severely low blood.  He also complained of lightheadedness.  He received 2 units of blood transfusion. -I have reviewed his smear which showed leukoerythroblastic blood picture with nucleated RBC and left shift.  I have recommended bone marrow aspiration and biopsy.  His bone marrow is likely infiltrated by prostate cancer. -His PSA is also increased to 250 range. - I have recommended alternative therapies including enzalutamide and chemotherapy with docetaxel.  Patient is reluctant to consider chemotherapy at this time.  We talked about starting him on enzalutamide 160 mg daily.  We will start it at a lower dose of 80 mg for 1 week, increase it to 120 mg and then ramp it up slowly during the second month to full dose.  We discussed the side effects in detail including possibility of seizures.  He understands and gives his permission to proceed with treatment. - We have sent prescription to specialty pharmacy.  He will be evaluated in 2 weeks when we will repeat his blood counts also.  2.  Bone metastasis: - His last denosumab was on  08/30/2018.  He will continue it every 4 weeks.  He will continue calcium supplements.  3.  Family history: - Sister died of metastatic cancer.  Primary is not known to the patient.  Another sister had colon cancer.  We will refer him for genetic evaluation for BRCA 1/2 and PALB2 mutations.  If he is positive it will give [place] another option (PARP-1 inhibitor) for treatment.

## 2018-10-11 NOTE — Progress Notes (Signed)
Dr. Delton Coombes in the room for bone marrow biopsy procedure.  Procedure explained with all questions asked and answered.  Consent signed and witnessed.   0842-time out performed with all in agreement.    0847-procedure started.   0905-procedure completed.  Patient tolerated well with no complaints voiced.    0930-dressing clean and dry.  Reviewed post bone marrow biopsy instructions with the patient and family with understanding verbalized.  All questions asked and answered.  Hand out given.  No s/s of distress noted.  VSS with discharge and left ambulatory with family.

## 2018-10-11 NOTE — Progress Notes (Signed)
INDICATION: Leukoerythroblastic blood picture.   Bone Marrow Biopsy and Aspiration Procedure Note   The patient was identified by name and date of birth, prior to start of the procedure and a timeout was performed.   An informed consent was obtained after discussing potential risks including bleeding, infection and pain.  The left posterior iliac crest was palpated, cleaned with ChloraPrep, and drapes applied.  1% lidocaine is infiltrated into the skin, subcutaneous tissue and periosteum.  Very small amount of marrow was aspirated.  The rest of the time it was a dry tap.  Core biopsy was obtained with a Jamshidi needle.  Pressure was applied to the biopsy site and bandage was placed over the biopsy site. Patient was made to lie on the back for 15 mins prior to discharge.  The procedure was tolerated well. COMPLICATIONS: None BLOOD LOSS: none Patient was discharged home in stable condition to return in 2 weeks to review results.  Patient was provided with post bone marrow biopsy instructions and instructed to call if there was any bleeding or worsening pain.  Specimens sent for studies.  Signed [name], MD

## 2018-10-11 NOTE — Progress Notes (Signed)
[place], [place] [address]   CLINIC:  Medical Oncology/Hematology  PCP:  Dettinger, Fransisca Kaufmann, MD [place] [address] [phone]   REASON FOR VISIT:  Follow-up for prostate cancer  CURRENT THERAPY: Zytiga and prednisone discontinued by self in August.  BRIEF ONCOLOGIC HISTORY:    Primary prostate cancer with metastasis from prostate to other site [facility])   06/20/2011 Initial Diagnosis    Prostate cancer ([place])    01/12/2016 Imaging    Bone density- This patient is considered osteopenic by World Healh Organization (WHO) Criteria.     03/16/2016 Imaging    CT CAP- No evidence of metastatic disease within the chest, abdomen, or pelvis. No other acute findings identified.    06/17/2016 Imaging    Bone scan- 1. Negative for metastatic pattern.    07/11/2016 Miscellaneous    Prolia every 6 months for osteopenia.    09/12/2016 -  Chemotherapy    Casodex/Depo-[name] every 3 months     09/27/2016 Imaging    CT CAP -No acute findings and no evidence for metastatic disease within the chest, abdomen or pelvis.    11/13/2017 Progression    Bone Scan: Multiple new abnormal sites of increased osseous tracer accumulation are identified as above consistent with osseous metastatic disease.  These include new foci of uptake at the proximal femora bilaterally.    11/16/2017 Progression    CT CAP-IMPRESSION: 1. New enlarged right pelvic sidewall lymph node and borderline left periaortic lymph node worrisome for metastatic adenopathy. 2. Subtle areas of sclerosis within the lumbar spine are more conspicuous than on the previous exam and worrisome for metastatic disease. Overall the areas of bone metastasis are more better demonstrated on bone scan from 11/13/2017 which demonstrate a progression of bone metastasis.      CANCER STAGING: Cancer Staging Primary prostate cancer with metastasis from prostate to other site  [facility]) Staging form: Prostate, AJCC 7th Edition - Clinical: Stage IIC (pT2c, N0, M0) - Signed by Baird Cancer, PA on 06/20/2011    INTERVAL HISTORY:  Walter Boone 67 y.o. male returns for follow-up of metastatic prostate cancer to the bones.  He was evaluated in our clinic few weeks ago and was found to have severely low hemoglobin.  He received 2 units of blood transfusion.  He felt much better after transfusion.  He continues to have occasional dizziness.  Denies any fevers, night sweats or weight loss.  He is accompanied by his son today.  He reports that he stopped taking Abiraterone in August secondary to side effects including nausea and severe weakness.  He is continuing [name] injections on a regular basis.  REVIEW OF SYSTEMS:  Review of Systems  Constitutional: Positive for chills and fatigue.  HENT:  Negative.   Eyes: Negative.   Respiratory: Negative.   Cardiovascular: Negative.   Gastrointestinal: Negative.   Endocrine: Negative.   Genitourinary: Negative.    Musculoskeletal: Negative.   Skin: Negative.   Neurological: Positive for dizziness.  Hematological: Negative.   Psychiatric/Behavioral: Negative.      PAST MEDICAL/SURGICAL HISTORY:  Past Medical History:  Diagnosis Date  . Acid reflux   . Alcoholism ([place])   . Arthritis   . Fatty liver   . Gynecomastia, male 01/11/2013   Secondary to prostate ca Tx.   . H/O Bell's palsy   . Hemorrhoids    history  . High cholesterol   . History of back injury 11/19/2013  .  Hyperglycemia   . Hyperlipidemia   . Hypertension   . Prostate cancer ([place])   . Prostate cancer ([place]) 06/20/2011  . Prostate carcinoma, recurrent ([place]) 08/10/2012   Past Surgical History:  Procedure Laterality Date  . [place]   left  . COLONOSCOPY  2008  . ESOPHAGOGASTRODUODENOSCOPY    . HAND SURGERY     nerve was torn - left hand  . HERNIA REPAIR  2010  . I&D EXTREMITY Left 06/10/2015   Procedure: IRRIGATION AND DEBRIDEMENT  EXTREMITY LEFT HAND EXPLORATION, nerve repair;  Surgeon: Charlotte Crumb, MD;  Location: [place];  Service: Orthopedics;  Laterality: Left;  . KNEE SURGERY     left  . PROSTATE BIOPSY  11/06     SOCIAL HISTORY:  Social History   Socioeconomic History  . Marital status: Married    Spouse name: Not on file  . Number of children: 4  . Years of education: Not on file  . Highest education level: GED or equivalent  Occupational History  . Occupation: Disabled  Social Needs  . Financial resource strain: Not very hard  . Food insecurity:    Worry: Sometimes true    Inability: Sometimes true  . Transportation needs:    Medical: No    Non-medical: No  Tobacco Use  . Smoking status: Former Smoker    Packs/day: 2.50    Years: 3.00    Pack years: 7.50  . Smokeless tobacco: Never Used  Substance and Sexual Activity  . Alcohol use: No    Comment: former drinker 20 years ago  . Drug use: No  . Sexual activity: Not Currently  Lifestyle  . Physical activity:    Days per week: 0 days    Minutes per session: 0 min  . Stress: Not at all  Relationships  . Social connections:    Talks on phone: More than three times a week    Gets together: More than three times a week    Attends religious service: Not on file    Active member of club or organization: Not on file    Attends meetings of clubs or organizations: Not on file    Relationship status: Married  . Intimate partner violence:    Fear of current or ex partner: No    Emotionally abused: No    Physically abused: No    Forced sexual activity: No  Other Topics Concern  . Not on file  Social History Narrative  . Not on file    FAMILY HISTORY:  Family History  Problem Relation Age of Onset  . Heart failure Mother   . Diabetes Father   . Cancer Sister   . Diabetes Brother     CURRENT MEDICATIONS:  Outpatient Encounter Medications as of 10/11/2018  Medication Sig  . amLODipine (NORVASC) 10 MG tablet Take 1 tablet (10 mg  total) by mouth daily.  . Calcium-Magnesium-Vitamin D (CALCIUM 1200+D3 PO) Take 3 tablets by mouth daily.   . Denosumab (XGEVA [place]) Inject into the skin every 30 (thirty) days.   . diclofenac sodium (VOLTAREN) 1 % GEL Apply 2 g topically 4 (four) times daily. (Patient taking differently: Apply 2 g topically 4 (four) times daily as needed (for pain). )  . enzalutamide (XTANDI) 40 MG capsule Take 4 capsules (160 mg total) by mouth daily.  Marland Kitchen ibuprofen (ADVIL,MOTRIN) 800 MG tablet Take 1 tablet (800 mg total) by mouth every 8 (eight) hours as needed. (Patient taking differently: Take 800  mg by mouth every 8 (eight) hours as needed for headache or moderate pain. )  . lactulose (CHRONULAC) 10 GM/15ML solution Take 15 mLs (10 g total) by mouth 2 (two) times daily as needed for mild constipation.  Marland Kitchen Leuprolide Acetate ([name] IJ) Inject as directed every 3 (three) months.  . Menthol-Methyl Salicylate (MUSCLE RUB) 10-15 % CREA Apply 1 application topically daily as needed for muscle pain.   . mirtazapine (REMERON) 15 MG tablet Take 1 tablet (15 mg total) by mouth at bedtime.  Marland Kitchen oxyCODONE-acetaminophen (PERCOCET/ROXICET) 5-325 MG tablet Take 1 tablet by mouth every 12 (twelve) hours as needed for severe pain.  . polyethylene glycol powder (GLYCOLAX/MIRALAX) powder Take 17 g by mouth 2 (two) times daily as needed for moderate constipation.   . tamsulosin (FLOMAX) 0.4 MG CAPS capsule Take 1 capsule (0.4 mg total) by mouth 2 (two) times daily.  . traMADol (ULTRAM) 50 MG tablet Take 2 tablets (100 mg total) by mouth every 4 (four) hours as needed. (Patient taking differently: Take 100 mg by mouth every 4 (four) hours as needed for moderate pain. )  . [DISCONTINUED] enzalutamide (XTANDI) 40 MG capsule Take 4 capsules (160 mg total) by mouth daily.   No facility-administered encounter medications on file as of 10/11/2018.     ALLERGIES:  No Known Allergies   PHYSICAL EXAM:  ECOG Performance status:  1  Vitals:   10/11/18 0826  BP: 129/67  Pulse: 85  Resp: 18  Temp: 98.7 F (37.1 C)  SpO2: 99%   There were no vitals filed for this visit.  Physical Exam  Constitutional: He is oriented to person, place, and time.  Cardiovascular: Normal rate, regular rhythm and normal heart sounds.  Pulmonary/Chest: Effort normal and breath sounds normal.  Neurological: He is alert and oriented to person, place, and time.  Skin: Skin is warm and dry.  Abdomen: Soft nontender with no palpable abnormality Extremities: No edema or cyanosis.   LABORATORY DATA:  I have reviewed the labs as listed.  CBC    Component Value Date/Time   WBC 7.5 10/11/2018 0822   RBC 2.97 (L) 10/11/2018 0822   HGB 9.0 (L) 10/11/2018 0822   HGB 7.3 (LL) 09/06/2018 1101   HCT 29.0 (L) 10/11/2018 0822   HCT 22.4 (L) 09/06/2018 1101   PLT 107 (L) 10/11/2018 0822   PLT 113 (L) 09/06/2018 1101   MCV 97.6 10/11/2018 0822   MCV 95 09/06/2018 1101   MCH 30.3 10/11/2018 0822   MCHC 31.0 10/11/2018 0822   RDW 16.1 (H) 10/11/2018 0822   RDW 16.3 (H) 09/06/2018 1101   LYMPHSABS 2.1 10/11/2018 0822   LYMPHSABS 2.2 09/06/2018 1101   MONOABS 0.8 10/11/2018 0822   EOSABS 0.2 10/11/2018 0822   EOSABS 0.2 09/06/2018 1101   BASOSABS 0.1 10/11/2018 0822   BASOSABS 0.1 09/06/2018 1101   CMP Latest Ref Rng & Units 09/28/2018 09/06/2018 09/02/2018  Glucose 70 - 99 mg/dL 156(H) 134(H) 136(H)  BUN 8 - 23 mg/dL 18 19 24(H)  Creatinine 0.61 - 1.24 mg/dL 0.93 0.83 0.98  Sodium 135 - 145 mmol/L 139 141 136  Potassium 3.5 - 5.1 mmol/L 3.8 3.9 3.6  Chloride 98 - 111 mmol/L 109 104 107  CO2 22 - 32 mmol/L 22 20 21(L)  Calcium 8.9 - 10.3 mg/dL 8.5(L) 8.7 8.6(L)  Total Protein 6.5 - 8.1 g/dL 7.0 6.4 -  Total Bilirubin 0.3 - 1.2 mg/dL 0.5 0.3 -  Alkaline Phos 38 - 126 U/L  110 137(H) -  AST 15 - 41 U/L 21 18 -  ALT 0 - 44 U/L 13 10 -       DIAGNOSTIC IMAGING:  I have independently reviewed the scans including bone scan and CT  scan dated 04/27/2018 and discussed the results with the patient.  I reviewed the images with him.     ASSESSMENT & PLAN:   Primary prostate cancer with metastasis from prostate to other site [facility]) 1.  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 - Zytiga and prednisone started in January 2019. -Patient could not tolerate it well secondary to nausea and severe tiredness.  Has not been taking them as prescribed.  He completely discontinued it on May 17 when he went to visit family in [place]. -Last bone scan and CT scan on 04/27/2018 shows worsening bone metastasis with no visceral metastasis. - At last visit in July I have recommended him to go back on Abiraterone.  He started back at lower dose of 500 mg daily and discontinued it in August secondary to intolerance from nausea and severe tiredness. - He was recently evaluated in our clinic and was found to have severely low blood.  He also complained of lightheadedness.  He received 2 units of blood transfusion. -I have reviewed his smear which showed leukoerythroblastic blood picture with nucleated RBC and left shift.  I have recommended bone marrow aspiration and biopsy.  His bone marrow is likely infiltrated by prostate cancer. -His PSA is also increased to 250 range. - I have recommended alternative therapies including enzalutamide and chemotherapy with docetaxel.  Patient is reluctant to consider chemotherapy at this time.  We talked about starting him on enzalutamide 160 mg daily.  We will start it at a lower dose of 80 mg for 1 week, increase it to 120 mg and then ramp it up slowly during the second month to full dose.  We discussed the side effects in detail including possibility of seizures.  He understands and gives his permission to  proceed with treatment. - We have sent prescription to specialty pharmacy.  He will be evaluated in 2 weeks when we will repeat his blood counts also.  2.  Bone metastasis: - His last denosumab was on 08/30/2018.  He will continue it every 4 weeks.  He will continue calcium supplements.  3.  Family history: - Sister died of metastatic cancer.  Primary is not known to the patient.  Another sister had colon cancer.  We will refer him for genetic evaluation for BRCA 1/2 and PALB2 mutations.  If he is positive it will give [place] another option (PARP-1 inhibitor) for treatment.  Total time spent is 40 minutes with more than 50% of the time spent face-to-face discussing blood test results, need for bone marrow aspiration, new treatment plan, side effects and coordination of care.    Orders placed this encounter:  No orders of the defined types were placed in this encounter.     [name], MD [place] [phone]

## 2018-10-11 NOTE — Telephone Encounter (Signed)
Oral Oncology Patient Advocate Encounter  Received notification from [facility] that prior authorization for Gillermina Phy is required.  PA submitted on CoverMyMeds Key [id number] Status is pending  Oral Oncology Clinic will continue to follow.  Jodelle Gross (CPhT) [facility] Specialty Pharmacy Patient Advocate [phone] 10/11/2018 11:47 AM

## 2018-10-11 NOTE — Telephone Encounter (Signed)
Oral Oncology Pharmacist Encounter  Received new prescription for Xtandi (enzalutamide) for the treatment of Metastatic castration refractory prostate cancer in conjunction with [name], planned duration until disease progression or unacceptable drug toxicity.  BP from 10/11/18 assessed, no relevant lab abnormalities. Prescription dose and frequency assessed.   Current medication list in Epic reviewed, several DDIs with Xtandi identified: - Enzalutamide may decrease the serum concentration of amlodipine, mirtazapine, oxycodone/APAP, tamsulosin, and tramadol. Patient should be monitored for decreased effectiveness of the listed medication.  Prescription has been e-scribed to the [facility] for benefits analysis and approval.  Oral Oncology Clinic will continue to follow for insurance authorization, copayment issues, initial counseling and start date.  Darl Pikes, PharmD, BCPS, [facility] Hematology/Oncology Clinical Pharmacist [facility]/AP Oral [facility] [phone]  10/11/2018 10:27 AM

## 2018-10-11 NOTE — Patient Instructions (Signed)
Bone Marrow Aspiration and Bone Marrow Biopsy, Adult, Care After This sheet gives you information about how to care for yourself after your procedure. Your health care provider may also give you more specific instructions. If you have problems or questions, contact your health care provider. What can I expect after the procedure? After the procedure, it is common to have:  Mild pain and tenderness.  Swelling.  Bruising.  Follow these instructions at home:  Take over-the-counter or prescription medicines only as told by your health care provider.  Do not take baths, swim, or use a hot tub until your health care provider approves. Ask if you can take a shower or have a sponge bath.  Follow instructions from your health care provider about how to take care of the puncture site. Make sure you: ? Wash your hands with soap and water before you change your bandage (dressing). If soap and water are not available, use hand sanitizer. ? Change your dressing as told by your health care provider.  Check your puncture siteevery day for signs of infection. Check for: ? More redness, swelling, or pain. ? More fluid or blood. ? Warmth. ? Pus or a bad smell.  Return to your normal activities as told by your health care provider. Ask your health care provider what activities are safe for you.  Do not drive for 24 hours if you were given a medicine to help you relax (sedative).  Keep all follow-up visits as told by your health care provider. This is important. Contact a health care provider if:  You have more redness, swelling, or pain around the puncture site.  You have more fluid or blood coming from the puncture site.  Your puncture site feels warm to the touch.  You have pus or a bad smell coming from the puncture site.  You have a fever.  Your pain is not controlled with medicine. This information is not intended to replace advice given to you by your health care provider. Make sure  you discuss any questions you have with your health care provider. Document Released: 06/17/2005 Document Revised: 06/17/2016 Document Reviewed: 05/11/2016 Elsevier Interactive Patient Education  2018 [employer].

## 2018-10-12 NOTE — Telephone Encounter (Signed)
Oral Oncology Patient Advocate Encounter  Prior Authorization for Gillermina Phy has been approved.    PA# [id number] Effective dates: 10/12/18 through 04/10/19  Patients co-pay is $0.00 (deductible met).    Oral Oncology Clinic will continue to follow.   [place] [facility] [place] Phone [phone] Fax [phone] 10/12/2018 4:10 PM

## 2018-10-15 ENCOUNTER — Telehealth (HOSPITAL_COMMUNITY): Payer: Self-pay | Admitting: Pharmacy Technician

## 2018-10-15 NOTE — Telephone Encounter (Signed)
Oral Chemotherapy Pharmacist Encounter  Medication will be delivered to Walter Boone by 10/17/18.   Patient Education I spoke with patient for overview of new oral chemotherapy medication:Xtandi (enzalutamide) for the treatment of Metastatic castration refractory prostate cancer in conjunction with [name], planned duration until disease progression or unacceptable drug toxicity.   Pt is doing well. Counseled patient on administration, dosing, side effects, monitoring, drug-food interactions, safe handling, storage, and disposal. Patient will take 4 capsules (160 mg total) by mouth daily.  Side effects include but not limited to: fatigue, HTN, hot flashes.    Reviewed with patient importance of keeping a medication schedule and plan for any missed doses.  Walter Boone voiced understanding and appreciation. All questions answered. Medication handout placed in the mail.  Provided patient with Oral [facility] phone number. Patient knows to call the office with questions or concerns. Oral Chemotherapy Navigation Clinic will continue to follow.  Darl Pikes, PharmD, BCPS, [facility] Hematology/Oncology Clinical Pharmacist [facility]/AP Oral [place] [phone]  10/15/2018 3:27 PM

## 2018-10-16 ENCOUNTER — Ambulatory Visit (HOSPITAL_COMMUNITY): Payer: Medicare HMO | Admitting: Registered Nurse

## 2018-10-16 ENCOUNTER — Encounter (HOSPITAL_COMMUNITY): Payer: Self-pay | Admitting: *Deleted

## 2018-10-16 ENCOUNTER — Encounter (HOSPITAL_COMMUNITY)
Admission: RE | Disposition: A | Discharge status: Home / Self Care | Payer: Self-pay | Source: Ambulatory Visit | Attending: Urology

## 2018-10-16 ENCOUNTER — Ambulatory Visit (HOSPITAL_COMMUNITY)
Admission: RE | Admit: 2018-10-16 | Discharge: 2018-10-16 | Disposition: A | Discharge status: Home / Self Care | Payer: Medicare HMO | Source: Ambulatory Visit | Attending: Urology | Admitting: Urology

## 2018-10-16 DIAGNOSIS — F1020 Alcohol dependence, uncomplicated: Secondary | ICD-10-CM | POA: Insufficient documentation

## 2018-10-16 DIAGNOSIS — N138 Other obstructive and reflux uropathy: Secondary | ICD-10-CM | POA: Insufficient documentation

## 2018-10-16 DIAGNOSIS — G709 Myoneural disorder, unspecified: Secondary | ICD-10-CM | POA: Insufficient documentation

## 2018-10-16 DIAGNOSIS — R338 Other retention of urine: Secondary | ICD-10-CM | POA: Diagnosis not present

## 2018-10-16 DIAGNOSIS — Z87891 Personal history of nicotine dependence: Secondary | ICD-10-CM | POA: Insufficient documentation

## 2018-10-16 DIAGNOSIS — E7800 Pure hypercholesterolemia, unspecified: Secondary | ICD-10-CM | POA: Diagnosis not present

## 2018-10-16 DIAGNOSIS — N62 Hypertrophy of breast: Secondary | ICD-10-CM | POA: Insufficient documentation

## 2018-10-16 DIAGNOSIS — N401 Enlarged prostate with lower urinary tract symptoms: Secondary | ICD-10-CM | POA: Insufficient documentation

## 2018-10-16 DIAGNOSIS — Z809 Family history of malignant neoplasm, unspecified: Secondary | ICD-10-CM | POA: Insufficient documentation

## 2018-10-16 DIAGNOSIS — I10 Essential (primary) hypertension: Secondary | ICD-10-CM | POA: Insufficient documentation

## 2018-10-16 DIAGNOSIS — C61 Malignant neoplasm of prostate: Secondary | ICD-10-CM | POA: Insufficient documentation

## 2018-10-16 DIAGNOSIS — Z8249 Family history of ischemic heart disease and other diseases of the circulatory system: Secondary | ICD-10-CM | POA: Diagnosis not present

## 2018-10-16 DIAGNOSIS — K649 Unspecified hemorrhoids: Secondary | ICD-10-CM | POA: Insufficient documentation

## 2018-10-16 DIAGNOSIS — K760 Fatty (change of) liver, not elsewhere classified: Secondary | ICD-10-CM | POA: Diagnosis not present

## 2018-10-16 DIAGNOSIS — E785 Hyperlipidemia, unspecified: Secondary | ICD-10-CM | POA: Insufficient documentation

## 2018-10-16 DIAGNOSIS — N400 Enlarged prostate without lower urinary tract symptoms: Secondary | ICD-10-CM | POA: Diagnosis not present

## 2018-10-16 DIAGNOSIS — R739 Hyperglycemia, unspecified: Secondary | ICD-10-CM | POA: Insufficient documentation

## 2018-10-16 DIAGNOSIS — Z8546 Personal history of malignant neoplasm of prostate: Secondary | ICD-10-CM | POA: Insufficient documentation

## 2018-10-16 DIAGNOSIS — Z833 Family history of diabetes mellitus: Secondary | ICD-10-CM | POA: Insufficient documentation

## 2018-10-16 DIAGNOSIS — K219 Gastro-esophageal reflux disease without esophagitis: Secondary | ICD-10-CM | POA: Diagnosis not present

## 2018-10-16 DIAGNOSIS — M1990 Unspecified osteoarthritis, unspecified site: Secondary | ICD-10-CM | POA: Diagnosis not present

## 2018-10-16 HISTORY — PX: CYSTOSCOPY WITH INSERTION OF UROLIFT: SHX6678

## 2018-10-16 SURGERY — CYSTOSCOPY WITH INSERTION OF UROLIFT
Anesthesia: Monitor Anesthesia Care

## 2018-10-16 MED ORDER — MIDAZOLAM HCL 2 MG/2ML IJ SOLN
INTRAMUSCULAR | Status: AC
  Filled 2018-10-16: qty 2

## 2018-10-16 MED ORDER — LIDOCAINE HCL URETHRAL/MUCOSAL 2 % EX GEL
CUTANEOUS | Status: DC | PRN
  Administered 2018-10-16: 1 via URETHRAL

## 2018-10-16 MED ORDER — MEPERIDINE HCL 50 MG/ML IJ SOLN: 6.2500 mg | INTRAMUSCULAR | Status: DC | PRN

## 2018-10-16 MED ORDER — HYDROMORPHONE HCL 1 MG/ML IJ SOLN: 0.2500 mg | INTRAMUSCULAR | Status: DC | PRN

## 2018-10-16 MED ORDER — SULFAMETHOXAZOLE-TRIMETHOPRIM 800-160 MG PO TABS: 1.0000 | ORAL_TABLET | Freq: Two times a day (BID) | ORAL | 0 refills | Status: DC

## 2018-10-16 MED ORDER — SODIUM CHLORIDE 0.9% FLUSH
INTRAVENOUS | Status: AC
  Filled 2018-10-16: qty 10

## 2018-10-16 MED ORDER — STERILE WATER FOR IRRIGATION IR SOLN
Status: DC | PRN
  Administered 2018-10-16: 3000 mL
  Administered 2018-10-16: 1000 mL

## 2018-10-16 MED ORDER — LIDOCAINE HCL URETHRAL/MUCOSAL 2 % EX GEL
CUTANEOUS | Status: AC
  Filled 2018-10-16: qty 10

## 2018-10-16 MED ORDER — SUCCINYLCHOLINE CHLORIDE 20 MG/ML IJ SOLN
INTRAMUSCULAR | Status: AC
  Filled 2018-10-16: qty 1

## 2018-10-16 MED ORDER — HYDROCODONE-ACETAMINOPHEN 7.5-325 MG PO TABS: 1.0000 | ORAL_TABLET | Freq: Once | ORAL | Status: DC | PRN

## 2018-10-16 MED ORDER — FENTANYL CITRATE (PF) 100 MCG/2ML IJ SOLN
INTRAMUSCULAR | Status: DC | PRN
  Administered 2018-10-16 (×2): .5 ug via INTRAVENOUS

## 2018-10-16 MED ORDER — CEFAZOLIN SODIUM-DEXTROSE 2-4 GM/100ML-% IV SOLN
INTRAVENOUS | Status: AC
  Filled 2018-10-16: qty 100

## 2018-10-16 MED ORDER — ONDANSETRON HCL 4 MG/2ML IJ SOLN: 4.0000 mg | Freq: Once | INTRAMUSCULAR | Status: DC | PRN

## 2018-10-16 MED ORDER — KETOROLAC TROMETHAMINE 30 MG/ML IJ SOLN: 30.0000 mg | Freq: Once | INTRAMUSCULAR | Status: DC | PRN

## 2018-10-16 MED ORDER — CEFAZOLIN SODIUM-DEXTROSE 2-4 GM/100ML-% IV SOLN
2.0000 g | INTRAVENOUS | Status: AC
  Administered 2018-10-16: 2 g via INTRAVENOUS

## 2018-10-16 MED ORDER — FENTANYL CITRATE (PF) 100 MCG/2ML IJ SOLN
INTRAMUSCULAR | Status: AC
  Filled 2018-10-16: qty 2

## 2018-10-16 MED ORDER — PROPOFOL 500 MG/50ML IV EMUL
INTRAVENOUS | Status: DC | PRN
  Administered 2018-10-16: 150 ug/kg/min via INTRAVENOUS

## 2018-10-16 MED ORDER — LACTATED RINGERS IV SOLN
INTRAVENOUS | Status: DC
  Administered 2018-10-16: 1000 mL via INTRAVENOUS

## 2018-10-16 MED ORDER — MIDAZOLAM HCL 5 MG/5ML IJ SOLN
INTRAMUSCULAR | Status: DC | PRN
  Administered 2018-10-16: 2 mg via INTRAVENOUS

## 2018-10-16 MED ORDER — PROPOFOL 10 MG/ML IV BOLUS
INTRAVENOUS | Status: AC
  Filled 2018-10-16: qty 40

## 2018-10-16 MED FILL — XTANDI 40 MG CAPSULE: 40 | 30 days supply | Qty: 120 | Fill #0

## 2018-10-16 SURGICAL SUPPLY — 18 items
BAG DRAIN URO TABLE W/ADPT NS (BAG) ×3 IMPLANT
BAG DRN 8 ADPR NS SKTRN CSTL (BAG) ×1
CLOTH BEACON ORANGE TIMEOUT ST (SAFETY) ×3 IMPLANT
GLOVE BIO SURGEON STRL SZ8 (GLOVE) ×3 IMPLANT
GLOVE BIOGEL PI IND STRL 7.0 (GLOVE) ×2 IMPLANT
GLOVE BIOGEL PI INDICATOR 7.0 (GLOVE) ×6
GOWN STRL REUS W/TWL LRG LVL3 (GOWN DISPOSABLE) ×3 IMPLANT
GOWN STRL REUS W/TWL XL LVL3 (GOWN DISPOSABLE) ×3 IMPLANT
KIT TURNOVER CYSTO (KITS) ×3 IMPLANT
MANIFOLD NEPTUNE II (INSTRUMENTS) ×3 IMPLANT
PACK CYSTO (CUSTOM PROCEDURE TRAY) ×3 IMPLANT
PAD ARMBOARD 7.5X6 YLW CONV (MISCELLANEOUS) ×3 IMPLANT
SYSTEM UROLIFT (Male Continence) ×12 IMPLANT
TOWEL OR 17X26 4PK STRL BLUE (TOWEL DISPOSABLE) ×3 IMPLANT
TRAY FOLEY W/BAG SLVR 16FR (SET/KITS/TRAYS/PACK)
TRAY FOLEY W/BAG SLVR 16FR ST (SET/KITS/TRAYS/PACK) IMPLANT
WATER STERILE IRR 3000ML UROMA (IV SOLUTION) ×3 IMPLANT
WATER STERILE IRR 500ML POUR (IV SOLUTION) ×3 IMPLANT

## 2018-10-16 NOTE — Op Note (Signed)
Preoperative diagnosis: Prostate cancer/BPH with obstructive symptomatology.  Postoperative diagnosis: Same  Principal procedure: Urolift procedure, with the placement of 6 implants.  Surgeon: Diona Fanti  Anesthesia: Gen. with LMA  Complications: None  Drains: 73 French Foley catheter, to leg bag.  Estimated blood loss: Less than 25 mL  Indications: 67 year old male with obstructive symptomatology secondary to BPH/prostate cancer.  The patient is currently on self intermittent catheterization for incomplete emptying.  Recent cystoscopy is revealed an obstructive prostate.  Management options including TURP with resection/ablation of the prostate as well as Urolift were discussed.  The patient has chosen to have a Urolift procedure.  He has been instructed to the procedure as well as risks and complications which include but are not limited to infection, bleeding, and inadequate treatment with the Urolift procedure alone, anesthetic complications, among others.  He understands these and desires to proceed.  Findings: Using the 17 French cystoscope, urethra and bladder were inspected.  There were no urethral lesions.  Prostatic urethra was obstructed secondary to bilobar hypertrophy.  The bladder was inspected circumferentially.  This revealed normal findings.  Description of procedure: The patient was properly identified in the holding area.  He received preoperative IV antibiotics.  He was taken to the operating room where general anesthetic was administered with the LMA.  He is placed in the dorsolithotomy position.  Genitalia and perineum were prepped and draped.  Proper timeout was performed.  A 2F cystoscope was inserted into the bladder. The cystoscopy bridge was replaced with a UroLift delivery device.The first treatment site was the patient's right side approximately 1.5cm distal to the bladder neck. The distal tip of the delivery device was then angled laterally approximately 20 degrees  at this position to compress the lateral lobe. The trigger was pulled, thereby deploying a needle containing the implant through the prostate. The needle was then retracted, allowing one end of the implant to be delivered to the capsular surface of the prostate. The implant was then tensioned to assure capsular seating and removal of slack monofilament. The device was then angled back toward midline and slowly advanced proximally until cystoscopic verification of the monofilament being centered in the delivery bay. The urethral end piece was then affixed to the monofilament thereby tailoring the size of the implant. Excess filament was then severed. The delivery device was then re-advanced into the bladder. The delivery device was then replaced with cystoscope and bridge and the implant location and opening effect was confirmed cystoscopically. The same procedure was then repeated on the left side, and to additional implants were delivered just proximal to the verumontanum, again one on right and one on left side of the prostate, following the same technique.  There was somewhat of a "fishmouth" at the bladder neck, and then 2 more implants were stacked at the bladder neck, angled anterior laterally, on the right and left side.   A final cystoscopy was conducted first to inspect the location and state of each implant and second, to confirm the presence of a continuous anterior channel was present through the prostatic urethra with irrigation flow turned off.  6 implants were delivered in total.  Following this, the scope was removed.Marland Kitchen  He was then awakened and taken to the PACU in stable condition.  He tolerated the procedure well.

## 2018-10-16 NOTE — Transfer of Care (Signed)
Immediate Anesthesia Transfer of Care Note  Patient: Walter Boone  Procedure(s) Performed: CYSTOSCOPY WITH INSERTION OF UROLIFT (N/A )  Patient Location: PACU  Anesthesia Type:MAC  Level of Consciousness: awake, oriented and patient cooperative  Airway & Oxygen Therapy: Patient Spontanous Breathing  Post-op Assessment: Report given to RN and Post -op Vital signs reviewed and stable  Post vital signs: Reviewed and stable  Last Vitals:  Vitals Value Taken Time  BP    Temp    Pulse 71 10/16/2018  8:00 AM  Resp 20 10/16/2018  8:00 AM  SpO2 96 % 10/16/2018  8:00 AM  Vitals shown include unvalidated device data.  Last Pain:  Vitals:   10/16/18 0636  TempSrc: Oral  PainSc: 0-No pain      Patients Stated Pain Goal: 7 (38/46/65 9935)  Complications: No apparent anesthesia complications

## 2018-10-16 NOTE — H&P (Signed)
H&P  Chief Complaint: Urinary retention  History of Present Illness: 67 year old male with progressive prostate cancer is currently on self intermittent catheterization for lateral outlet obstruction from his prostate cancer/BPH.  He presents at this time for Urolift procedure.  Past Medical History:  Diagnosis Date  . Acid reflux   . Alcoholism ([place])   . Arthritis   . Fatty liver   . Gynecomastia, male 01/11/2013   Secondary to prostate ca Tx.   . H/O Bell's palsy   . Hemorrhoids    history  . High cholesterol   . History of back injury 11/19/2013  . Hyperglycemia   . Hyperlipidemia   . Hypertension   . Prostate cancer ([place])   . Prostate cancer ([place]) 06/20/2011  . Prostate carcinoma, recurrent ([place]) 08/10/2012    Past Surgical History:  Procedure Laterality Date  . [place]   left  . COLONOSCOPY  2008  . ESOPHAGOGASTRODUODENOSCOPY    . HAND SURGERY     nerve was torn - left hand  . HERNIA REPAIR  2010  . I&D EXTREMITY Left 06/10/2015   Procedure: IRRIGATION AND DEBRIDEMENT EXTREMITY LEFT HAND EXPLORATION, nerve repair;  Surgeon: Charlotte Crumb, MD;  Location: [place];  Service: Orthopedics;  Laterality: Left;  . KNEE SURGERY     left  . PROSTATE BIOPSY  11/06    Home Medications:    Allergies: No Known Allergies  Family History  Problem Relation Age of Onset  . Heart failure Mother   . Diabetes Father   . Cancer Sister   . Diabetes Brother     Social History:  reports that he has quit smoking. He has a 7.50 pack-year smoking history. He has never used smokeless tobacco. He reports that he does not drink alcohol or use drugs.  ROS: A complete review of systems was performed.  All systems are negative except for pertinent findings as noted.  Physical Exam:  Vital signs in last 24 hours: Temp:  [98.1 F (36.7 C)] 98.1 F (36.7 C) (11/05 0636) Resp:  [18] 18 (11/05 0636) BP: (122)/(67) 122/67 (11/05 0636) SpO2:  [96 %] 96 % (11/05  0636) Constitutional:  Alert and oriented, No acute distress Cardiovascular: Regular rate  Respiratory: Normal respiratory effort GI: Abdomen is soft, nontender, nondistended, no abdominal masses. No CVAT.  Genitourinary: Normal male phallus, testes are descended bilaterally and non-tender and without masses, scrotum is normal in appearance without lesions or masses, perineum is normal on inspection. Lymphatic: No lymphadenopathy Neurologic: Grossly intact, no focal deficits Psychiatric: Normal mood and affect  Laboratory Data:  No results for input(s): WBC, HGB, HCT, PLT in the last 72 hours.  No results for input(s): NA, K, CL, GLUCOSE, BUN, CALCIUM, CREATININE in the last 72 hours.  Invalid input(s): CO3   No results found for this or any previous visit (from the past 24 hour(s)). No results found for this or any previous visit (from the past 240 hour(s)).  Renal Function: No results for input(s): CREATININE in the last 168 hours. CrCl cannot be calculated (Unknown ideal weight.).  Radiologic Imaging: No results found.  Impression/Assessment:  BPH/prostate cancer with bladder outlet obstruction/retention  Plan:  Urolift procedure

## 2018-10-16 NOTE — Anesthesia Procedure Notes (Signed)
Procedure Name: MAC Date/Time: 10/16/2018 7:26 AM Performed by: Andree Elk Amy A, CRNA Pre-anesthesia Checklist: Patient identified, Emergency Drugs available, Suction available, Patient being monitored and Timeout performed Oxygen Delivery Method: Simple face mask

## 2018-10-16 NOTE — Discharge Instructions (Signed)
Take antibiotics that were sent into your pharmacy  You may need to catheterize, at least for short period of time.  You should be able to do this without difficulty  Realize that you may have an increased urge to urinate as well as urinary frequency for a while  There will be some blood in your urine, especially at the first part of the urination for several days  If necessary because of a slow stream, you may take the tamsulosin, but try to wean off of this medication  Report any fever or significant amount of bleeding

## 2018-10-16 NOTE — Anesthesia Postprocedure Evaluation (Signed)
Anesthesia Post Note  Patient: Walter Boone  Procedure(s) Performed: CYSTOSCOPY WITH INSERTION OF UROLIFT (N/A )  Patient location during evaluation: PACU Anesthesia Type: MAC Level of consciousness: awake and alert and oriented Pain management: pain level controlled Vital Signs Assessment: post-procedure vital signs reviewed and stable Respiratory status: spontaneous breathing Cardiovascular status: stable Postop Assessment: no apparent nausea or vomiting Anesthetic complications: no     Last Vitals:  Vitals:   10/16/18 0636  BP: 122/67  Resp: 18  Temp: 36.7 C  SpO2: 96%    Last Pain:  Vitals:   10/16/18 0636  TempSrc: Oral  PainSc: 0-No pain                 ADAMS, AMY A

## 2018-10-16 NOTE — Anesthesia Preprocedure Evaluation (Signed)
Anesthesia Evaluation  Patient identified by MRN, date of birth, ID band Patient awake    Reviewed: Allergy & Precautions, H&P , NPO status , Patient's Chart, lab work & pertinent test results, reviewed documented beta blocker date and time   Airway Mallampati: II  TM Distance: >3 FB Neck ROM: full    Dental no notable dental hx.    Pulmonary neg pulmonary ROS, former smoker,    Pulmonary exam normal breath sounds clear to auscultation       Cardiovascular Exercise Tolerance: Good hypertension, negative cardio ROS   Rhythm:regular Rate:Normal     Neuro/Psych PSYCHIATRIC DISORDERS  Neuromuscular disease    GI/Hepatic Neg liver ROS, GERD  ,  Endo/Other  negative endocrine ROS  Renal/GU negative Renal ROS  negative genitourinary   Musculoskeletal   Abdominal   Peds  Hematology negative hematology ROS (+)   Anesthesia Other Findings   Reproductive/Obstetrics negative OB ROS                             Anesthesia Physical Anesthesia Plan  ASA: III  Anesthesia Plan: MAC   Post-op Pain Management:    Induction:   PONV Risk Score and Plan:   Airway Management Planned:   Additional Equipment:   Intra-op Plan:   Post-operative Plan:   Informed Consent: I have reviewed the patients History and Physical, chart, labs and discussed the procedure including the risks, benefits and alternatives for the proposed anesthesia with the patient or authorized representative who has indicated his/her understanding and acceptance.   Dental Advisory Given  Plan Discussed with: CRNA  Anesthesia Plan Comments:         Anesthesia Quick Evaluation

## 2018-10-16 NOTE — OR Nursing (Signed)
Patient arrived from PACU at [phone] and states that wife will be to get him in a "little while, she is working at the food bank".

## 2018-10-17 ENCOUNTER — Encounter (HOSPITAL_COMMUNITY): Payer: Self-pay | Admitting: Urology

## 2018-10-18 ENCOUNTER — Ambulatory Visit (HOSPITAL_COMMUNITY): Payer: Self-pay | Admitting: Hematology

## 2018-10-23 DIAGNOSIS — R339 Retention of urine, unspecified: Secondary | ICD-10-CM | POA: Diagnosis not present

## 2018-10-23 DIAGNOSIS — C61 Malignant neoplasm of prostate: Secondary | ICD-10-CM | POA: Diagnosis not present

## 2018-10-25 ENCOUNTER — Ambulatory Visit (HOSPITAL_COMMUNITY): Payer: Self-pay | Admitting: Hematology

## 2018-10-25 ENCOUNTER — Ambulatory Visit (HOSPITAL_COMMUNITY): Payer: Self-pay

## 2018-10-25 ENCOUNTER — Other Ambulatory Visit (HOSPITAL_COMMUNITY): Payer: Self-pay

## 2018-10-26 ENCOUNTER — Inpatient Hospital Stay (HOSPITAL_COMMUNITY): Payer: Medicare HMO

## 2018-10-26 ENCOUNTER — Encounter (HOSPITAL_COMMUNITY): Payer: Self-pay | Admitting: Hematology

## 2018-10-26 ENCOUNTER — Other Ambulatory Visit: Payer: Self-pay

## 2018-10-26 ENCOUNTER — Inpatient Hospital Stay (HOSPITAL_COMMUNITY): Payer: Medicare HMO | Attending: Hematology | Admitting: Hematology

## 2018-10-26 VITALS — BP 112/74 | HR 83 | Resp 16 | Wt 181.4 lb

## 2018-10-26 DIAGNOSIS — Z79899 Other long term (current) drug therapy: Secondary | ICD-10-CM | POA: Diagnosis not present

## 2018-10-26 DIAGNOSIS — Z87891 Personal history of nicotine dependence: Secondary | ICD-10-CM | POA: Insufficient documentation

## 2018-10-26 DIAGNOSIS — Z923 Personal history of irradiation: Secondary | ICD-10-CM | POA: Diagnosis not present

## 2018-10-26 DIAGNOSIS — C7951 Secondary malignant neoplasm of bone: Secondary | ICD-10-CM | POA: Insufficient documentation

## 2018-10-26 DIAGNOSIS — C61 Malignant neoplasm of prostate: Secondary | ICD-10-CM | POA: Diagnosis not present

## 2018-10-26 DIAGNOSIS — I10 Essential (primary) hypertension: Secondary | ICD-10-CM | POA: Diagnosis not present

## 2018-10-26 LAB — CBC WITH DIFFERENTIAL/PLATELET
ABS IMMATURE GRANULOCYTES: 1.58 10*3/uL — AB (ref 0.00–0.07)
BASOS ABS: 0.1 10*3/uL (ref 0.0–0.1)
BASOS PCT: 2 %
Eosinophils Absolute: 0.2 10*3/uL (ref 0.0–0.5)
Eosinophils Relative: 2 %
HEMATOCRIT: 27.7 % — AB (ref 39.0–52.0)
Hemoglobin: 8.4 g/dL — ABNORMAL LOW (ref 13.0–17.0)
Immature Granulocytes: 18 %
LYMPHS PCT: 28 %
Lymphs Abs: 2.4 10*3/uL (ref 0.7–4.0)
MCH: 29.8 pg (ref 26.0–34.0)
MCHC: 30.3 g/dL (ref 30.0–36.0)
MCV: 98.2 fL (ref 80.0–100.0)
MONO ABS: 0.6 10*3/uL (ref 0.1–1.0)
Monocytes Relative: 7 %
NEUTROS PCT: 43 %
NRBC: 5 % — AB (ref 0.0–0.2)
Neutro Abs: 3.7 10*3/uL (ref 1.7–7.7)
PLATELETS: 144 10*3/uL — AB (ref 150–400)
RBC: 2.82 MIL/uL — AB (ref 4.22–5.81)
RDW: 15.7 % — AB (ref 11.5–15.5)
WBC: 8.6 10*3/uL (ref 4.0–10.5)

## 2018-10-26 LAB — COMPREHENSIVE METABOLIC PANEL
ALT: 13 U/L (ref 0–44)
ANION GAP: 10 (ref 5–15)
AST: 17 U/L (ref 15–41)
Albumin: 3.7 g/dL (ref 3.5–5.0)
Alkaline Phosphatase: 129 U/L — ABNORMAL HIGH (ref 38–126)
BILIRUBIN TOTAL: 0.4 mg/dL (ref 0.3–1.2)
BUN: 14 mg/dL (ref 8–23)
CHLORIDE: 106 mmol/L (ref 98–111)
CO2: 24 mmol/L (ref 22–32)
Calcium: 8.9 mg/dL (ref 8.9–10.3)
Creatinine, Ser: 0.9 mg/dL (ref 0.61–1.24)
Glucose, Bld: 138 mg/dL — ABNORMAL HIGH (ref 70–99)
Potassium: 4.2 mmol/L (ref 3.5–5.1)
Sodium: 140 mmol/L (ref 135–145)
TOTAL PROTEIN: 7.3 g/dL (ref 6.5–8.1)

## 2018-10-26 NOTE — Progress Notes (Signed)
Walter Boone, [place] [id number]   CLINIC:  Medical Oncology/Hematology  PCP:  Dettinger, Fransisca Kaufmann, MD [place] [address] [phone]   REASON FOR VISIT:  Follow-up for prostate cancer  CURRENT THERAPY: Zytiga and prednisone discontinued by self in August.  BRIEF ONCOLOGIC HISTORY:    Primary prostate cancer with metastasis from prostate to other site [facility])   06/20/2011 Initial Diagnosis    Prostate cancer ([place])    01/12/2016 Imaging    Bone density- This patient is considered osteopenic by World Healh Organization (WHO) Criteria.     03/16/2016 Imaging    CT CAP- No evidence of metastatic disease within the chest, abdomen, or pelvis. No other acute findings identified.    06/17/2016 Imaging    Bone scan- 1. Negative for metastatic pattern.    07/11/2016 Miscellaneous    Prolia every 6 months for osteopenia.    09/12/2016 -  Chemotherapy    Casodex/Depo-[name] every 3 months     09/27/2016 Imaging    CT CAP -No acute findings and no evidence for metastatic disease within the chest, abdomen or pelvis.    11/13/2017 Progression    Bone Scan: Multiple new abnormal sites of increased osseous tracer accumulation are identified as above consistent with osseous metastatic disease.  These include new foci of uptake at the proximal femora bilaterally.    11/16/2017 Progression    CT CAP-IMPRESSION: 1. New enlarged right pelvic sidewall lymph node and borderline left periaortic lymph node worrisome for metastatic adenopathy. 2. Subtle areas of sclerosis within the lumbar spine are more conspicuous than on the previous exam and worrisome for metastatic disease. Overall the areas of bone metastasis are more better demonstrated on bone scan from 11/13/2017 which demonstrate a progression of bone metastasis.      CANCER STAGING: Cancer Staging Primary prostate cancer with metastasis from prostate to other site  [facility]) Staging form: Prostate, AJCC 7th Edition - Clinical: Stage IIC (pT2c, N0, M0) - Signed by Baird Cancer, PA on 06/20/2011    INTERVAL HISTORY:  Walter Boone 67 y.o. male returns for follow-up of his metastatic prostate cancer to the bones.  He reportedly had a uro-lift procedure done on 10/16/2018 and its helping with urination.  He also started [place] on 10/22/2018.  He started taking 2 pills on the first and increased it to 4 pills daily.  He is tolerating it very well.  Denies any excessive fatigue.  Denies any falls.  No chest pains or lightheadedness was reported.  He does have occasional hot flashes.  REVIEW OF SYSTEMS:  Review of Systems  Constitutional: Negative for chills and fatigue.  HENT:  Negative.   Eyes: Negative.   Respiratory: Negative.   Cardiovascular: Negative.   Gastrointestinal: Positive for constipation.  Endocrine: Positive for hot flashes.  Genitourinary: Negative.    Musculoskeletal: Negative.   Skin: Negative.   Neurological: Negative for dizziness.  Hematological: Negative.   Psychiatric/Behavioral: Negative.      PAST MEDICAL/SURGICAL HISTORY:  Past Medical History:  Diagnosis Date  . Acid reflux   . Alcoholism ([place])   . Arthritis   . Fatty liver   . Gynecomastia, male 01/11/2013   Secondary to prostate ca Tx.   . H/O Bell's palsy   . Hemorrhoids    history  . High cholesterol   . History of back injury 11/19/2013  . Hyperglycemia   . Hyperlipidemia   . Hypertension   .  Prostate cancer ([place])   . Prostate cancer ([place]) 06/20/2011  . Prostate carcinoma, recurrent ([place]) 08/10/2012   Past Surgical History:  Procedure Laterality Date  . [place]   left  . COLONOSCOPY  2008  . CYSTOSCOPY WITH INSERTION OF UROLIFT N/A 10/16/2018   Procedure: CYSTOSCOPY WITH INSERTION OF UROLIFT;  Surgeon: Franchot Gallo, MD;  Location: [facility];  Service: Urology;  Laterality: N/A;  . ESOPHAGOGASTRODUODENOSCOPY    . HAND SURGERY     nerve was  torn - left hand  . HERNIA REPAIR  2010  . I&D EXTREMITY Left 06/10/2015   Procedure: IRRIGATION AND DEBRIDEMENT EXTREMITY LEFT HAND EXPLORATION, nerve repair;  Surgeon: Charlotte Crumb, MD;  Location: [place];  Service: Orthopedics;  Laterality: Left;  . KNEE SURGERY     left  . PROSTATE BIOPSY  11/06     SOCIAL HISTORY:  Social History   Socioeconomic History  . Marital status: Married    Spouse name: Not on file  . Number of children: 4  . Years of education: Not on file  . Highest education level: GED or equivalent  Occupational History  . Occupation: Disabled  Social Needs  . Financial resource strain: Not very hard  . Food insecurity:    Worry: Sometimes true    Inability: Sometimes true  . Transportation needs:    Medical: No    Non-medical: No  Tobacco Use  . Smoking status: Former Smoker    Packs/day: 2.50    Years: 3.00    Pack years: 7.50  . Smokeless tobacco: Never Used  Substance and Sexual Activity  . Alcohol use: No    Comment: former drinker 20 years ago  . Drug use: No  . Sexual activity: Not Currently  Lifestyle  . Physical activity:    Days per week: 0 days    Minutes per session: 0 min  . Stress: Not at all  Relationships  . Social connections:    Talks on phone: More than three times a week    Gets together: More than three times a week    Attends religious service: Not on file    Active member of club or organization: Not on file    Attends meetings of clubs or organizations: Not on file    Relationship status: Married  . Intimate partner violence:    Fear of current or ex partner: No    Emotionally abused: No    Physically abused: No    Forced sexual activity: No  Other Topics Concern  . Not on file  Social History Narrative  . Not on file    FAMILY HISTORY:  Family History  Problem Relation Age of Onset  . Heart failure Mother   . Diabetes Father   . Cancer Sister   . Diabetes Brother     CURRENT MEDICATIONS:  Outpatient  Encounter Medications as of 10/26/2018  Medication Sig  . amLODipine (NORVASC) 10 MG tablet Take 1 tablet (10 mg total) by mouth daily.  . Calcium-Magnesium-Vitamin D (CALCIUM 1200+D3 PO) Take 3 tablets by mouth daily.   . diclofenac sodium (VOLTAREN) 1 % GEL Apply 2 g topically 4 (four) times daily. (Patient taking differently: Apply 2 g topically 4 (four) times daily as needed (for pain). )  . enzalutamide (XTANDI) 40 MG capsule Take 4 capsules (160 mg total) by mouth daily.  Marland Kitchen ibuprofen (ADVIL,MOTRIN) 800 MG tablet Take 1 tablet (800 mg total) by mouth every 8 (eight) hours as  needed. (Patient taking differently: Take 800 mg by mouth every 8 (eight) hours as needed for headache or moderate pain. )  . lactulose (CHRONULAC) 10 GM/15ML solution Take 15 mLs (10 g total) by mouth 2 (two) times daily as needed for mild constipation.  Marland Kitchen Leuprolide Acetate ([name] IJ) Inject as directed every 3 (three) months.  . Menthol-Methyl Salicylate (MUSCLE RUB) 10-15 % CREA Apply 1 application topically daily as needed for muscle pain.   . mirtazapine (REMERON) 15 MG tablet Take 1 tablet (15 mg total) by mouth at bedtime.  Marland Kitchen oxyCODONE-acetaminophen (PERCOCET/ROXICET) 5-325 MG tablet Take 1 tablet by mouth every 12 (twelve) hours as needed for severe pain.  . polyethylene glycol powder (GLYCOLAX/MIRALAX) powder Take 17 g by mouth 2 (two) times daily as needed for moderate constipation.   . sulfamethoxazole-trimethoprim (BACTRIM DS,SEPTRA DS) 800-160 MG tablet Take 1 tablet by mouth 2 (two) times daily.  . tamsulosin (FLOMAX) 0.4 MG CAPS capsule Take 1 capsule (0.4 mg total) by mouth 2 (two) times daily.  . traMADol (ULTRAM) 50 MG tablet Take 2 tablets (100 mg total) by mouth every 4 (four) hours as needed. (Patient taking differently: Take 100 mg by mouth every 4 (four) hours as needed for moderate pain. )  . [DISCONTINUED] Denosumab (XGEVA [place]) Inject into the skin every 30 (thirty) days.    No  facility-administered encounter medications on file as of 10/26/2018.     ALLERGIES:  No Known Allergies   PHYSICAL EXAM:  ECOG Performance status: 1  Vitals:   10/26/18 0940  BP: 112/74  Pulse: 83  Resp: 16  SpO2: 99%   Filed Weights   10/26/18 0940  Weight: 181 lb 6.4 oz (82.3 kg)    Physical Exam  Constitutional: He is oriented to person, place, and time.  Cardiovascular: Normal rate, regular rhythm and normal heart sounds.  Pulmonary/Chest: Effort normal and breath sounds normal.  Neurological: He is alert and oriented to person, place, and time.  Skin: Skin is warm and dry.  Abdomen: Soft nontender with no palpable abnormality Extremities: No edema or cyanosis.   LABORATORY DATA:  I have reviewed the labs as listed.  CBC    Component Value Date/Time   WBC PENDING 10/26/2018 1015   RBC 2.82 (L) 10/26/2018 1015   HGB 8.4 (L) 10/26/2018 1015   HGB 7.3 (LL) 09/06/2018 1101   HCT 27.7 (L) 10/26/2018 1015   HCT 22.4 (L) 09/06/2018 1101   PLT 144 (L) 10/26/2018 1015   PLT 113 (L) 09/06/2018 1101   MCV 98.2 10/26/2018 1015   MCV 95 09/06/2018 1101   MCH 29.8 10/26/2018 1015   MCHC 30.3 10/26/2018 1015   RDW 15.7 (H) 10/26/2018 1015   RDW 16.3 (H) 09/06/2018 1101   LYMPHSABS PENDING 10/26/2018 1015   LYMPHSABS 2.2 09/06/2018 1101   MONOABS PENDING 10/26/2018 1015   EOSABS PENDING 10/26/2018 1015   EOSABS 0.2 09/06/2018 1101   BASOSABS PENDING 10/26/2018 1015   BASOSABS 0.1 09/06/2018 1101   CMP Latest Ref Rng & Units 09/28/2018 09/06/2018 09/02/2018  Glucose 70 - 99 mg/dL 156(H) 134(H) 136(H)  BUN 8 - 23 mg/dL 18 19 24(H)  Creatinine 0.61 - 1.24 mg/dL 0.93 0.83 0.98  Sodium 135 - 145 mmol/L 139 141 136  Potassium 3.5 - 5.1 mmol/L 3.8 3.9 3.6  Chloride 98 - 111 mmol/L 109 104 107  CO2 22 - 32 mmol/L 22 20 21(L)  Calcium 8.9 - 10.3 mg/dL 8.5(L) 8.7 8.6(L)  Total Protein  6.5 - 8.1 g/dL 7.0 6.4 -  Total Bilirubin 0.3 - 1.2 mg/dL 0.5 0.3 -  Alkaline Phos 38  - 126 U/L 110 137(H) -  AST 15 - 41 U/L 21 18 -  ALT 0 - 44 U/L 13 10 -       DIAGNOSTIC IMAGING:  I have independently reviewed the scans including bone scan and CT scan dated 04/27/2018 and discussed the results with the patient.  I reviewed the images with him.     ASSESSMENT & PLAN:   Primary prostate cancer with metastasis from prostate to other site [facility]) 1.  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 - Zytiga and prednisone started in January 2019. -Patient could not tolerate it well secondary to nausea and severe tiredness.  Has not been taking them as prescribed.  He completely discontinued it on May 17 when he went to visit family in [place]. -Last bone scan and CT scan on 04/27/2018 shows worsening bone metastasis with no visceral metastasis. - At last visit in July I have recommended him to go back on Abiraterone.  He started back at lower dose of 500 mg daily and discontinued it in August secondary to intolerance from nausea and severe tiredness. - He was recently evaluated in our clinic and was found to have severely low blood.  He also complained of lightheadedness.  He received 2 units of blood transfusion. -I have reviewed his smear which showed leukoerythroblastic blood picture with nucleated RBC and left shift.  I have recommended bone marrow aspiration and biopsy.  His bone marrow is likely infiltrated by prostate cancer. -Last PSA on 09/25/2018 was 254. - I have recommended enzalutamide or chemotherapy with docetaxel.  He preferred enzalutamide. -UroLift procedure was done on 10/16/2018 which is helping his urine flow.  He does not have to self catheterize anymore. -Enzalutamide was started on 10/22/2018.  He is tolerating 4 tablets daily very well.  Denies any major  fatigue. - We discussed the results of the bone marrow biopsy dated 10/11/2018 which shows complete infiltration by prostate cancer cells. - We will check his CBC today to make sure he does not require any blood transfusion. -I will see him back in 2 weeks and repeat labs.  2.  Bone metastasis: - His last denosumab was on 08/30/2018.  He will continue it every 4 weeks.  He will continue calcium supplements.  3.  Family history: - Sister died of metastatic cancer.  Primary is not known to the patient.  Another sister had colon cancer.  We will refer him for genetic evaluation for BRCA 1/2 and PALB2 mutations.  If he is positive it will give [place] another option (PARP-1 inhibitor) for treatment.     Orders placed this encounter:  Orders Placed This Encounter  Procedures  . CBC with Differential/Platelet  . Comprehensive metabolic panel  . Comprehensive metabolic panel  . CBC with Differential      [name], MD [place] [phone]

## 2018-10-26 NOTE — Assessment & Plan Note (Addendum)
1.  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 - Zytiga and prednisone started in January 2019. -Patient could not tolerate it well secondary to nausea and severe tiredness.  Has not been taking them as prescribed.  He completely discontinued it on May 17 when he went to visit family in [place]. -Last bone scan and CT scan on 04/27/2018 shows worsening bone metastasis with no visceral metastasis. - At last visit in July I have recommended him to go back on Abiraterone.  He started back at lower dose of 500 mg daily and discontinued it in August secondary to intolerance from nausea and severe tiredness. - He was recently evaluated in our clinic and was found to have severely low blood.  He also complained of lightheadedness.  He received 2 units of blood transfusion. -I have reviewed his smear which showed leukoerythroblastic blood picture with nucleated RBC and left shift.  I have recommended bone marrow aspiration and biopsy.  His bone marrow is likely infiltrated by prostate cancer. -Last PSA on 09/25/2018 was 254. - I have recommended enzalutamide or chemotherapy with docetaxel.  He preferred enzalutamide. -UroLift procedure was done on 10/16/2018 which is helping his urine flow.  He does not have to self catheterize anymore. -Enzalutamide was started on 10/22/2018.  He is tolerating 4 tablets daily very well.  Denies any major fatigue. - We discussed the results of the bone marrow biopsy dated 10/11/2018 which shows complete infiltration by prostate cancer cells. - We will check his CBC today to make sure he does not require any blood transfusion. -I will see him back in 2 weeks and repeat labs.  2.  Bone metastasis: - His last denosumab was on 08/30/2018.  He will continue it every 4 weeks.   He will continue calcium supplements.  3.  Family history: - Sister died of metastatic cancer.  Primary is not known to the patient.  Another sister had colon cancer.  We will refer him for genetic evaluation for BRCA 1/2 and PALB2 mutations.  If he is positive it will give [place] another option (PARP-1 inhibitor) for treatment.

## 2018-11-05 ENCOUNTER — Other Ambulatory Visit (HOSPITAL_COMMUNITY): Payer: Self-pay | Admitting: Hematology

## 2018-11-05 DIAGNOSIS — C61 Malignant neoplasm of prostate: Secondary | ICD-10-CM

## 2018-11-06 ENCOUNTER — Other Ambulatory Visit (HOSPITAL_COMMUNITY): Payer: Self-pay | Admitting: Nurse Practitioner

## 2018-11-06 ENCOUNTER — Inpatient Hospital Stay (HOSPITAL_COMMUNITY): Payer: Medicare HMO

## 2018-11-06 DIAGNOSIS — Z923 Personal history of irradiation: Secondary | ICD-10-CM | POA: Diagnosis not present

## 2018-11-06 DIAGNOSIS — I10 Essential (primary) hypertension: Secondary | ICD-10-CM | POA: Diagnosis not present

## 2018-11-06 DIAGNOSIS — Z79899 Other long term (current) drug therapy: Secondary | ICD-10-CM | POA: Diagnosis not present

## 2018-11-06 DIAGNOSIS — C61 Malignant neoplasm of prostate: Secondary | ICD-10-CM

## 2018-11-06 DIAGNOSIS — C7951 Secondary malignant neoplasm of bone: Secondary | ICD-10-CM | POA: Diagnosis not present

## 2018-11-06 DIAGNOSIS — Z87891 Personal history of nicotine dependence: Secondary | ICD-10-CM | POA: Diagnosis not present

## 2018-11-06 LAB — CBC WITH DIFFERENTIAL/PLATELET
ABS IMMATURE GRANULOCYTES: 0.62 10*3/uL — AB (ref 0.00–0.07)
BASOS PCT: 1 %
Basophils Absolute: 0.1 10*3/uL (ref 0.0–0.1)
EOS ABS: 0.2 10*3/uL (ref 0.0–0.5)
EOS PCT: 3 %
HCT: 27.3 % — ABNORMAL LOW (ref 39.0–52.0)
Hemoglobin: 8.3 g/dL — ABNORMAL LOW (ref 13.0–17.0)
IMMATURE GRANULOCYTES: 11 %
LYMPHS ABS: 1.4 10*3/uL (ref 0.7–4.0)
Lymphocytes Relative: 25 %
MCH: 29.7 pg (ref 26.0–34.0)
MCHC: 30.4 g/dL (ref 30.0–36.0)
MCV: 97.8 fL (ref 80.0–100.0)
Monocytes Absolute: 0.6 10*3/uL (ref 0.1–1.0)
Monocytes Relative: 10 %
NEUTROS PCT: 50 %
NRBC: 7.2 % — AB (ref 0.0–0.2)
Neutro Abs: 2.9 10*3/uL (ref 1.7–7.7)
PLATELETS: 60 10*3/uL — AB (ref 150–400)
RBC: 2.79 MIL/uL — ABNORMAL LOW (ref 4.22–5.81)
RDW: 16.9 % — ABNORMAL HIGH (ref 11.5–15.5)
WBC: 5.7 10*3/uL (ref 4.0–10.5)

## 2018-11-06 LAB — COMPREHENSIVE METABOLIC PANEL
ALK PHOS: 179 U/L — AB (ref 38–126)
ALT: 11 U/L (ref 0–44)
ANION GAP: 8 (ref 5–15)
AST: 34 U/L (ref 15–41)
Albumin: 4.1 g/dL (ref 3.5–5.0)
BUN: 22 mg/dL (ref 8–23)
CALCIUM: 8.7 mg/dL — AB (ref 8.9–10.3)
CHLORIDE: 104 mmol/L (ref 98–111)
CO2: 23 mmol/L (ref 22–32)
Creatinine, Ser: 0.97 mg/dL (ref 0.61–1.24)
Glucose, Bld: 169 mg/dL — ABNORMAL HIGH (ref 70–99)
Potassium: 4 mmol/L (ref 3.5–5.1)
SODIUM: 135 mmol/L (ref 135–145)
Total Bilirubin: 0.7 mg/dL (ref 0.3–1.2)
Total Protein: 7.5 g/dL (ref 6.5–8.1)

## 2018-11-06 LAB — TYPE AND SCREEN
ABO/RH(D): A NEG
Antibody Screen: NEGATIVE

## 2018-11-07 ENCOUNTER — Encounter (HOSPITAL_COMMUNITY): Payer: Self-pay

## 2018-11-13 ENCOUNTER — Inpatient Hospital Stay (HOSPITAL_COMMUNITY): Payer: Medicare HMO | Attending: Hematology

## 2018-11-13 DIAGNOSIS — M1990 Unspecified osteoarthritis, unspecified site: Secondary | ICD-10-CM | POA: Diagnosis not present

## 2018-11-13 DIAGNOSIS — Z87891 Personal history of nicotine dependence: Secondary | ICD-10-CM | POA: Diagnosis not present

## 2018-11-13 DIAGNOSIS — R5383 Other fatigue: Secondary | ICD-10-CM | POA: Insufficient documentation

## 2018-11-13 DIAGNOSIS — E785 Hyperlipidemia, unspecified: Secondary | ICD-10-CM | POA: Diagnosis not present

## 2018-11-13 DIAGNOSIS — K5903 Drug induced constipation: Secondary | ICD-10-CM | POA: Insufficient documentation

## 2018-11-13 DIAGNOSIS — Z923 Personal history of irradiation: Secondary | ICD-10-CM | POA: Diagnosis not present

## 2018-11-13 DIAGNOSIS — R630 Anorexia: Secondary | ICD-10-CM | POA: Diagnosis not present

## 2018-11-13 DIAGNOSIS — E7800 Pure hypercholesterolemia, unspecified: Secondary | ICD-10-CM | POA: Insufficient documentation

## 2018-11-13 DIAGNOSIS — K219 Gastro-esophageal reflux disease without esophagitis: Secondary | ICD-10-CM | POA: Insufficient documentation

## 2018-11-13 DIAGNOSIS — M8580 Other specified disorders of bone density and structure, unspecified site: Secondary | ICD-10-CM | POA: Diagnosis not present

## 2018-11-13 DIAGNOSIS — C7951 Secondary malignant neoplasm of bone: Secondary | ICD-10-CM | POA: Insufficient documentation

## 2018-11-13 DIAGNOSIS — D649 Anemia, unspecified: Secondary | ICD-10-CM | POA: Insufficient documentation

## 2018-11-13 DIAGNOSIS — Z791 Long term (current) use of non-steroidal anti-inflammatories (NSAID): Secondary | ICD-10-CM | POA: Diagnosis not present

## 2018-11-13 DIAGNOSIS — Z9221 Personal history of antineoplastic chemotherapy: Secondary | ICD-10-CM | POA: Diagnosis not present

## 2018-11-13 DIAGNOSIS — Z79818 Long term (current) use of other agents affecting estrogen receptors and estrogen levels: Secondary | ICD-10-CM | POA: Insufficient documentation

## 2018-11-13 DIAGNOSIS — Z79899 Other long term (current) drug therapy: Secondary | ICD-10-CM | POA: Diagnosis not present

## 2018-11-13 DIAGNOSIS — Z800 Family history of malignant neoplasm of digestive organs: Secondary | ICD-10-CM | POA: Diagnosis not present

## 2018-11-13 DIAGNOSIS — C61 Malignant neoplasm of prostate: Secondary | ICD-10-CM | POA: Diagnosis not present

## 2018-11-13 DIAGNOSIS — I10 Essential (primary) hypertension: Secondary | ICD-10-CM | POA: Diagnosis not present

## 2018-11-13 DIAGNOSIS — R110 Nausea: Secondary | ICD-10-CM | POA: Diagnosis not present

## 2018-11-13 LAB — COMPREHENSIVE METABOLIC PANEL
ALT: 12 U/L (ref 0–44)
AST: 18 U/L (ref 15–41)
Albumin: 3.6 g/dL (ref 3.5–5.0)
Alkaline Phosphatase: 142 U/L — ABNORMAL HIGH (ref 38–126)
Anion gap: 7 (ref 5–15)
BILIRUBIN TOTAL: 0.5 mg/dL (ref 0.3–1.2)
BUN: 23 mg/dL (ref 8–23)
CO2: 24 mmol/L (ref 22–32)
CREATININE: 1.1 mg/dL (ref 0.61–1.24)
Calcium: 8.9 mg/dL (ref 8.9–10.3)
Chloride: 108 mmol/L (ref 98–111)
GFR calc Af Amer: >60 mL/min (ref >60)
GLUCOSE: 152 mg/dL — AB (ref 70–99)
Potassium: 3.5 mmol/L (ref 3.5–5.1)
Sodium: 139 mmol/L (ref 135–145)
Total Protein: 7.5 g/dL (ref 6.5–8.1)

## 2018-11-13 LAB — CBC WITH DIFFERENTIAL/PLATELET
Abs Immature Granulocytes: 1.44 10*3/uL — ABNORMAL HIGH (ref 0.00–0.07)
Basophils Absolute: 0.1 10*3/uL (ref 0.0–0.1)
Basophils Relative: 1 %
EOS PCT: 3 %
Eosinophils Absolute: 0.2 10*3/uL (ref 0.0–0.5)
HEMATOCRIT: 24.1 % — AB (ref 39.0–52.0)
HEMOGLOBIN: 7.3 g/dL — AB (ref 13.0–17.0)
Immature Granulocytes: 18 %
LYMPHS ABS: 1.8 10*3/uL (ref 0.7–4.0)
Lymphocytes Relative: 22 %
MCH: 29.8 pg (ref 26.0–34.0)
MCHC: 30.3 g/dL (ref 30.0–36.0)
MCV: 98.4 fL (ref 80.0–100.0)
MONO ABS: 0.8 10*3/uL (ref 0.1–1.0)
MONOS PCT: 10 %
Neutro Abs: 3.8 10*3/uL (ref 1.7–7.7)
Neutrophils Relative %: 46 %
Platelets: 93 10*3/uL — ABNORMAL LOW (ref 150–400)
RBC: 2.45 MIL/uL — ABNORMAL LOW (ref 4.22–5.81)
RDW: 16.9 % — ABNORMAL HIGH (ref 11.5–15.5)
WBC: 8.1 10*3/uL (ref 4.0–10.5)
nRBC: 8.9 % — ABNORMAL HIGH (ref 0.0–0.2)

## 2018-11-14 ENCOUNTER — Inpatient Hospital Stay (HOSPITAL_BASED_OUTPATIENT_CLINIC_OR_DEPARTMENT_OTHER): Payer: Medicare HMO | Admitting: Hematology

## 2018-11-14 ENCOUNTER — Other Ambulatory Visit: Payer: Self-pay

## 2018-11-14 ENCOUNTER — Ambulatory Visit (HOSPITAL_COMMUNITY): Payer: Self-pay | Admitting: Hematology

## 2018-11-14 ENCOUNTER — Inpatient Hospital Stay (HOSPITAL_COMMUNITY): Payer: Medicare HMO

## 2018-11-14 ENCOUNTER — Encounter (HOSPITAL_COMMUNITY): Payer: Self-pay | Admitting: Hematology

## 2018-11-14 VITALS — BP 139/78 | HR 83 | Temp 98.4°F | Resp 18 | Wt 179.1 lb

## 2018-11-14 DIAGNOSIS — Z9221 Personal history of antineoplastic chemotherapy: Secondary | ICD-10-CM

## 2018-11-14 DIAGNOSIS — Z800 Family history of malignant neoplasm of digestive organs: Secondary | ICD-10-CM

## 2018-11-14 DIAGNOSIS — D649 Anemia, unspecified: Secondary | ICD-10-CM | POA: Diagnosis not present

## 2018-11-14 DIAGNOSIS — Z923 Personal history of irradiation: Secondary | ICD-10-CM | POA: Diagnosis not present

## 2018-11-14 DIAGNOSIS — C7951 Secondary malignant neoplasm of bone: Secondary | ICD-10-CM

## 2018-11-14 DIAGNOSIS — R5383 Other fatigue: Secondary | ICD-10-CM | POA: Diagnosis not present

## 2018-11-14 DIAGNOSIS — K5903 Drug induced constipation: Secondary | ICD-10-CM

## 2018-11-14 DIAGNOSIS — Z87891 Personal history of nicotine dependence: Secondary | ICD-10-CM

## 2018-11-14 DIAGNOSIS — C61 Malignant neoplasm of prostate: Secondary | ICD-10-CM

## 2018-11-14 DIAGNOSIS — M1990 Unspecified osteoarthritis, unspecified site: Secondary | ICD-10-CM

## 2018-11-14 DIAGNOSIS — E785 Hyperlipidemia, unspecified: Secondary | ICD-10-CM

## 2018-11-14 DIAGNOSIS — Z791 Long term (current) use of non-steroidal anti-inflammatories (NSAID): Secondary | ICD-10-CM

## 2018-11-14 DIAGNOSIS — R630 Anorexia: Secondary | ICD-10-CM | POA: Diagnosis not present

## 2018-11-14 DIAGNOSIS — Z79899 Other long term (current) drug therapy: Secondary | ICD-10-CM

## 2018-11-14 DIAGNOSIS — K219 Gastro-esophageal reflux disease without esophagitis: Secondary | ICD-10-CM

## 2018-11-14 DIAGNOSIS — M8580 Other specified disorders of bone density and structure, unspecified site: Secondary | ICD-10-CM

## 2018-11-14 DIAGNOSIS — Z79818 Long term (current) use of other agents affecting estrogen receptors and estrogen levels: Secondary | ICD-10-CM | POA: Diagnosis not present

## 2018-11-14 DIAGNOSIS — I10 Essential (primary) hypertension: Secondary | ICD-10-CM

## 2018-11-14 DIAGNOSIS — R110 Nausea: Secondary | ICD-10-CM | POA: Diagnosis not present

## 2018-11-14 DIAGNOSIS — E7800 Pure hypercholesterolemia, unspecified: Secondary | ICD-10-CM

## 2018-11-14 LAB — PREPARE RBC (CROSSMATCH)

## 2018-11-14 MED ORDER — ENZALUTAMIDE 40 MG PO CAPS: 160.0000 mg | ORAL_CAPSULE | Freq: Every day | ORAL | 0 refills | Status: DC

## 2018-11-14 NOTE — Addendum Note (Signed)
Addended by: [place]ie [place] on: 11/14/2018 03:12 PM   Modules accepted: Orders

## 2018-11-14 NOTE — Patient Instructions (Signed)
[place] at [facility] Discharge Instructions  Follow up with [place] in 2 weeks with labs.  Start taking colace daily 200 mg Also start taking your calcium   Thank you for choosing [place] at [facility] to provide your oncology and hematology care.  To afford each patient quality time with our provider, please arrive at least 15 minutes before your scheduled appointment time.   If you have a lab appointment with the [facility] please come in thru the  Main Entrance and check in at the main information desk  You need to re-schedule your appointment should you arrive 10 or more minutes late.  We strive to give you quality time with our providers, and arriving late affects you and other patients whose appointments are after yours.  Also, if you no show three or more times for appointments you may be dismissed from the clinic at the providers discretion.     Again, thank you for choosing [facility].  Our hope is that these requests will decrease the amount of time that you wait before being seen by our physicians.       _____________________________________________________________  Should you have questions after your visit to [facility], please contact our office at [phone] between the hours of 8:00 a.m. and 4:30 p.m.  Voicemails left after 4:00 p.m. will not be returned until the following business day.  For prescription refill requests, have your pharmacy contact our office and allow 72 hours.    Cancer Center Support Programs:   > Cancer Support Group  2nd Tuesday of the month 1pm-2pm, Journey Room

## 2018-11-14 NOTE — Assessment & Plan Note (Signed)
1.  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 - Zytiga and prednisone started in January 2019. -Patient could not tolerate it well secondary to nausea and severe tiredness.  Has not been taking them as prescribed.  He completely discontinued it on May 17 when he went to visit family in [place]. -Last bone scan and CT scan on 04/27/2018 shows worsening bone metastasis with no visceral metastasis. - At last visit in July I have recommended him to go back on Abiraterone.  He started back at lower dose of 500 mg daily and discontinued it in August secondary to intolerance from nausea and severe tiredness. - He was recently evaluated in our clinic and was found to have severely low blood.  He also complained of lightheadedness.  He received 2 units of blood transfusion. -I have reviewed his smear which showed leukoerythroblastic blood picture with nucleated RBC and left shift.  I have recommended bone marrow aspiration and biopsy.  His bone marrow is likely infiltrated by prostate cancer. -Last PSA on 09/25/2018 was 254. - I have recommended enzalutamide or chemotherapy with docetaxel.  He preferred enzalutamide. -UroLift procedure was done on 10/16/2018 which is helping his urine flow.  He does not have to self catheterize anymore. -Enzalutamide was started on 10/22/2018, increased to 4 tablets daily.  He is tolerating well.  - Bone marrow biopsy on 10/11/2018 shows complete infiltration by prostate cancer cells. - He has mild fatigue since the start of enzalutamide.  Denies any chest pains or lightheadedness.  We reviewed the blood work today.  Hemoglobin is 7.3.  I have recommended 1 unit of blood transfusion.  He has mild constipation.  I have recommended him to take Colace daily. -I will reevaluate  him in 2 weeks.  2.  Bone metastasis: - His last denosumab was on 08/29/2018.  He has not received any doses after that.  We will plan to resume his denosumab on the day of transfusion.  He will continue calcium and vitamin D supplements.  3.  Family history: - Sister died of metastatic cancer.  Primary is not known to the patient.  Another sister had colon cancer.  We will refer him for genetic evaluation for BRCA 1/2 and PALB2 mutations.  If he is positive it will give [place] another option (PARP-1 inhibitor) for treatment.

## 2018-11-14 NOTE — Progress Notes (Signed)
[place], [place] [address]   CLINIC:  Medical Oncology/Hematology  PCP:  Dettinger, Fransisca Kaufmann, MD [place] [address] [phone]   REASON FOR VISIT: Follow-up for prostate cancer  CURRENT THERAPY: Zytiga and prednisone discontinued by self in August.  BRIEF ONCOLOGIC HISTORY:    Primary prostate cancer with metastasis from prostate to other site [facility])   06/20/2011 Initial Diagnosis    Prostate cancer ([place])    01/12/2016 Imaging    Bone density- This patient is considered osteopenic by World Healh Organization (WHO) Criteria.     03/16/2016 Imaging    CT CAP- No evidence of metastatic disease within the chest, abdomen, or pelvis. No other acute findings identified.    06/17/2016 Imaging    Bone scan- 1. Negative for metastatic pattern.    07/11/2016 Miscellaneous    Prolia every 6 months for osteopenia.    09/12/2016 -  Chemotherapy    Casodex/Depo-[name] every 3 months     09/27/2016 Imaging    CT CAP -No acute findings and no evidence for metastatic disease within the chest, abdomen or pelvis.    11/13/2017 Progression    Bone Scan: Multiple new abnormal sites of increased osseous tracer accumulation are identified as above consistent with osseous metastatic disease.  These include new foci of uptake at the proximal femora bilaterally.    11/16/2017 Progression    CT CAP-IMPRESSION: 1. New enlarged right pelvic sidewall lymph node and borderline left periaortic lymph node worrisome for metastatic adenopathy. 2. Subtle areas of sclerosis within the lumbar spine are more conspicuous than on the previous exam and worrisome for metastatic disease. Overall the areas of bone metastasis are more better demonstrated on bone scan from 11/13/2017 which demonstrate a progression of bone metastasis.      CANCER STAGING: Cancer Staging Primary prostate cancer with metastasis from prostate to other site [facility]) Staging  form: Prostate, AJCC 7th Edition - Clinical: Stage IIC (pT2c, N0, M0) - Signed by Baird Cancer, PA on 06/20/2011    INTERVAL HISTORY:  Mr. Schweitzer 67 y.o. male returns for routine follow-up for prostate cancer. He is here today with family. He is fatigued but able to move around and do some of his normal activities. He is cold all the time and has problems getting warm. He is taking 4 pills a day and tolerating it well. It causes constipation and has needed laxatives to help him. His appetite is decreased and he has lost 10 pounds since he started. He does feel a little nausous. He denies any new pains. Denies any vomiting. Denies any bleeding. He reports he has no appetite and his energy level is 50%.     REVIEW OF SYSTEMS:  Review of Systems  Gastrointestinal: Positive for constipation.  All other systems reviewed and are negative.    PAST MEDICAL/SURGICAL HISTORY:  Past Medical History:  Diagnosis Date  . Acid reflux   . Alcoholism ([place])   . Arthritis   . Fatty liver   . Gynecomastia, male 01/11/2013   Secondary to prostate ca Tx.   . H/O Bell's palsy   . Hemorrhoids    history  . High cholesterol   . History of back injury 11/19/2013  . Hyperglycemia   . Hyperlipidemia   . Hypertension   . Prostate cancer ([place])   . Prostate cancer ([place]) 06/20/2011  . Prostate carcinoma, recurrent ([place]) 08/10/2012   Past Surgical History:  Procedure Laterality  Date  . [place]   left  . COLONOSCOPY  2008  . CYSTOSCOPY WITH INSERTION OF UROLIFT N/A 10/16/2018   Procedure: CYSTOSCOPY WITH INSERTION OF UROLIFT;  Surgeon: Franchot Gallo, MD;  Location: [facility];  Service: Urology;  Laterality: N/A;  . ESOPHAGOGASTRODUODENOSCOPY    . HAND SURGERY     nerve was torn - left hand  . HERNIA REPAIR  2010  . I&D EXTREMITY Left 06/10/2015   Procedure: IRRIGATION AND DEBRIDEMENT EXTREMITY LEFT HAND EXPLORATION, nerve repair;  Surgeon: Charlotte Crumb, MD;  Location: [place];  Service:  Orthopedics;  Laterality: Left;  . KNEE SURGERY     left  . PROSTATE BIOPSY  11/06     SOCIAL HISTORY:  Social History   Socioeconomic History  . Marital status: Married    Spouse name: Not on file  . Number of children: 4  . Years of education: Not on file  . Highest education level: GED or equivalent  Occupational History  . Occupation: Disabled  Social Needs  . Financial resource strain: Not very hard  . Food insecurity:    Worry: Sometimes true    Inability: Sometimes true  . Transportation needs:    Medical: No    Non-medical: No  Tobacco Use  . Smoking status: Former Smoker    Packs/day: 2.50    Years: 3.00    Pack years: 7.50  . Smokeless tobacco: Never Used  Substance and Sexual Activity  . Alcohol use: No    Comment: former drinker 20 years ago  . Drug use: No  . Sexual activity: Not Currently  Lifestyle  . Physical activity:    Days per week: 0 days    Minutes per session: 0 min  . Stress: Not at all  Relationships  . Social connections:    Talks on phone: More than three times a week    Gets together: More than three times a week    Attends religious service: Not on file    Active member of club or organization: Not on file    Attends meetings of clubs or organizations: Not on file    Relationship status: Married  . Intimate partner violence:    Fear of current or ex partner: No    Emotionally abused: No    Physically abused: No    Forced sexual activity: No  Other Topics Concern  . Not on file  Social History Narrative  . Not on file    FAMILY HISTORY:  Family History  Problem Relation Age of Onset  . Heart failure Mother   . Diabetes Father   . Cancer Sister   . Diabetes Brother     CURRENT MEDICATIONS:  Outpatient Encounter Medications as of 11/14/2018  Medication Sig  . amLODipine (NORVASC) 10 MG tablet Take 1 tablet (10 mg total) by mouth daily.  . Calcium-Magnesium-Vitamin D (CALCIUM 1200+D3 PO) Take 3 tablets by mouth daily.     . diclofenac sodium (VOLTAREN) 1 % GEL Apply 2 g topically 4 (four) times daily. (Patient taking differently: Apply 2 g topically 4 (four) times daily as needed (for pain). )  . enzalutamide (XTANDI) 40 MG capsule Take 4 capsules (160 mg total) by mouth daily.  Marland Kitchen ibuprofen (ADVIL,MOTRIN) 800 MG tablet Take 1 tablet (800 mg total) by mouth every 8 (eight) hours as needed. (Patient taking differently: Take 800 mg by mouth every 8 (eight) hours as needed for headache or moderate pain. )  . lactulose (  CHRONULAC) 10 GM/15ML solution Take 15 mLs (10 g total) by mouth 2 (two) times daily as needed for mild constipation.  Marland Kitchen Leuprolide Acetate ([name] IJ) Inject as directed every 3 (three) months.  . Menthol-Methyl Salicylate (MUSCLE RUB) 10-15 % CREA Apply 1 application topically daily as needed for muscle pain.   . mirtazapine (REMERON) 15 MG tablet Take 1 tablet (15 mg total) by mouth at bedtime.  Marland Kitchen oxyCODONE-acetaminophen (PERCOCET/ROXICET) 5-325 MG tablet Take 1 tablet by mouth every 12 (twelve) hours as needed for severe pain.  . polyethylene glycol powder (GLYCOLAX/MIRALAX) powder Take 17 g by mouth 2 (two) times daily as needed for moderate constipation.   . sulfamethoxazole-trimethoprim (BACTRIM DS,SEPTRA DS) 800-160 MG tablet Take 1 tablet by mouth 2 (two) times daily.  . tamsulosin (FLOMAX) 0.4 MG CAPS capsule Take 1 capsule (0.4 mg total) by mouth 2 (two) times daily.  . traMADol (ULTRAM) 50 MG tablet Take 2 tablets (100 mg total) by mouth every 4 (four) hours as needed. (Patient taking differently: Take 100 mg by mouth every 4 (four) hours as needed for moderate pain. )  . vitamin B-12 (CYANOCOBALAMIN) 500 MCG tablet Take 500 mcg by mouth daily.   No facility-administered encounter medications on file as of 11/14/2018.     ALLERGIES:  No Known Allergies   PHYSICAL EXAM:  ECOG Performance status: 1  Vitals:   11/14/18 0850  BP: 139/78  Pulse: 83  Resp: 18  Temp: 98.4 F (36.9 C)   SpO2: 100%   Filed Weights   11/14/18 0850  Weight: 179 lb 1.6 oz (81.2 kg)    Physical Exam  Constitutional: He is oriented to person, place, and time. He appears well-developed and well-nourished.  Cardiovascular: Normal rate, regular rhythm and normal heart sounds.  Pulmonary/Chest: Effort normal and breath sounds normal.  Musculoskeletal: Normal range of motion.  Neurological: He is alert and oriented to person, place, and time.  Skin: Skin is warm and dry.     LABORATORY DATA:  I have reviewed the labs as listed.  CBC    Component Value Date/Time   WBC 8.1 11/13/2018 1242   RBC 2.45 (L) 11/13/2018 1242   HGB 7.3 (L) 11/13/2018 1242   HGB 7.3 (LL) 09/06/2018 1101   HCT 24.1 (L) 11/13/2018 1242   HCT 22.4 (L) 09/06/2018 1101   PLT 93 (L) 11/13/2018 1242   PLT 113 (L) 09/06/2018 1101   MCV 98.4 11/13/2018 1242   MCV 95 09/06/2018 1101   MCH 29.8 11/13/2018 1242   MCHC 30.3 11/13/2018 1242   RDW 16.9 (H) 11/13/2018 1242   RDW 16.3 (H) 09/06/2018 1101   LYMPHSABS 1.8 11/13/2018 1242   LYMPHSABS 2.2 09/06/2018 1101   MONOABS 0.8 11/13/2018 1242   EOSABS 0.2 11/13/2018 1242   EOSABS 0.2 09/06/2018 1101   BASOSABS 0.1 11/13/2018 1242   BASOSABS 0.1 09/06/2018 1101   CMP Latest Ref Rng & Units 11/13/2018 11/06/2018 10/26/2018  Glucose 70 - 99 mg/dL 152(H) 169(H) 138(H)  BUN 8 - 23 mg/dL _0 Creatinine 0.61 - 1.24 mg/dL 1.10 0.97 0.90  Sodium 135 - 145 mmol/L 139 135 140  Potassium 3.5 - 5.1 mmol/L 3.5 4.0 4.2  Chloride 98 - 111 mmol/L 108 104 106  CO2 22 - 32 mmol/L _1 Calcium 8.9 - 10.3 mg/dL 8.9 8.7(L) 8.9  Total Protein 6.5 - 8.1 g/dL 7.5 7.5 7.3  Total Bilirubin 0.3 - 1.2 mg/dL 0.5 0.7 0.4  Alkaline  Phos 38 - 126 U/L 142(H) 179(H) 129(H)  AST 15 - 41 U/L 18 34 17  ALT 0 - 44 U/L _0 I have independently reviewed the scans and discussed with the patient.   I have reviewed Francene Finders, NP's note and agree with the  documentation.  I personally performed a face-to-face visit, made revisions and my assessment and plan is as follows.     ASSESSMENT & PLAN:   Primary prostate cancer with metastasis from prostate to other site [facility]) 1.  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 - Zytiga and prednisone started in January 2019. -Patient could not tolerate it well secondary to nausea and severe tiredness.  Has not been taking them as prescribed.  He completely discontinued it on May 17 when he went to visit family in [place]. -Last bone scan and CT scan on 04/27/2018 shows worsening bone metastasis with no visceral metastasis. - At last visit in July I have recommended him to go back on Abiraterone.  He started back at lower dose of 500 mg daily and discontinued it in August secondary to intolerance from nausea and severe tiredness. - He was recently evaluated in our clinic and was found to have severely low blood.  He also complained of lightheadedness.  He received 2 units of blood transfusion. -I have reviewed his smear which showed leukoerythroblastic blood picture with nucleated RBC and left shift.  I have recommended bone marrow aspiration and biopsy.  His bone marrow is likely infiltrated by prostate cancer. -Last PSA on 09/25/2018 was 254. - I have recommended enzalutamide or chemotherapy with docetaxel.  He preferred enzalutamide. -UroLift procedure was done on 10/16/2018 which is helping his urine flow.  He does not have to self catheterize anymore. -Enzalutamide was started on 10/22/2018, increased to 4 tablets daily.  He is tolerating well.  - Bone marrow biopsy on 10/11/2018 shows complete infiltration by prostate cancer cells. - He has mild fatigue since the start of enzalutamide.  Denies any  chest pains or lightheadedness.  We reviewed the blood work today.  Hemoglobin is 7.3.  I have recommended 1 unit of blood transfusion.  He has mild constipation.  I have recommended him to take Colace daily. -I will reevaluate him in 2 weeks.  2.  Bone metastasis: - His last denosumab was on 08/29/2018.  He has not received any doses after that.  We will plan to resume his denosumab on the day of transfusion.  He will continue calcium and vitamin D supplements.  3.  Family history: - Sister died of metastatic cancer.  Primary is not known to the patient.  Another sister had colon cancer.  We will refer him for genetic evaluation for BRCA 1/2 and PALB2 mutations.  If he is positive it will give [place] another option (PARP-1 inhibitor) for treatment.      Orders placed this encounter:  Orders Placed This Encounter  Procedures  . CBC with Differential/Platelet  . Comprehensive metabolic panel  . Type and screen  . Prepare RBC  . Type and screen      [name], MD [place] [phone]

## 2018-11-15 ENCOUNTER — Encounter (HOSPITAL_COMMUNITY): Payer: Self-pay

## 2018-11-15 ENCOUNTER — Other Ambulatory Visit (HOSPITAL_COMMUNITY): Payer: Self-pay | Admitting: Nurse Practitioner

## 2018-11-15 ENCOUNTER — Inpatient Hospital Stay (HOSPITAL_COMMUNITY): Payer: Medicare HMO

## 2018-11-15 VITALS — BP 117/69 | HR 70 | Temp 97.5°F | Resp 17 | Wt 178.4 lb

## 2018-11-15 DIAGNOSIS — Z923 Personal history of irradiation: Secondary | ICD-10-CM | POA: Diagnosis not present

## 2018-11-15 DIAGNOSIS — D649 Anemia, unspecified: Secondary | ICD-10-CM

## 2018-11-15 DIAGNOSIS — C61 Malignant neoplasm of prostate: Secondary | ICD-10-CM | POA: Diagnosis not present

## 2018-11-15 DIAGNOSIS — C7951 Secondary malignant neoplasm of bone: Secondary | ICD-10-CM | POA: Diagnosis not present

## 2018-11-15 DIAGNOSIS — R110 Nausea: Secondary | ICD-10-CM | POA: Diagnosis not present

## 2018-11-15 DIAGNOSIS — R5383 Other fatigue: Secondary | ICD-10-CM | POA: Diagnosis not present

## 2018-11-15 DIAGNOSIS — Z9221 Personal history of antineoplastic chemotherapy: Secondary | ICD-10-CM | POA: Diagnosis not present

## 2018-11-15 DIAGNOSIS — K5903 Drug induced constipation: Secondary | ICD-10-CM | POA: Diagnosis not present

## 2018-11-15 DIAGNOSIS — Z79818 Long term (current) use of other agents affecting estrogen receptors and estrogen levels: Secondary | ICD-10-CM | POA: Diagnosis not present

## 2018-11-15 DIAGNOSIS — M8580 Other specified disorders of bone density and structure, unspecified site: Secondary | ICD-10-CM

## 2018-11-15 MED ORDER — ACETAMINOPHEN 325 MG PO TABS
650.0000 mg | ORAL_TABLET | Freq: Once | ORAL | Status: AC
  Administered 2018-11-15: 650 mg via ORAL
  Filled 2018-11-15: qty 2

## 2018-11-15 MED ORDER — DENOSUMAB 120 MG/1.7ML ~~LOC~~ SOLN
120.0000 mg | Freq: Once | SUBCUTANEOUS | Status: AC
  Administered 2018-11-15: 120 mg via SUBCUTANEOUS
  Filled 2018-11-15: qty 1.7

## 2018-11-15 MED ORDER — SODIUM CHLORIDE 0.9% IV SOLUTION
250.0000 mL | Freq: Once | INTRAVENOUS | Status: AC
  Administered 2018-11-15: 250 mL via INTRAVENOUS

## 2018-11-15 MED ORDER — DIPHENHYDRAMINE HCL 25 MG PO CAPS
25.0000 mg | ORAL_CAPSULE | Freq: Once | ORAL | Status: AC
  Administered 2018-11-15: 25 mg via ORAL
  Filled 2018-11-15: qty 1

## 2018-11-15 MED FILL — XTANDI 40 MG CAPSULE: 40 | 30 days supply | Qty: 120 | Fill #0

## 2018-11-15 NOTE — Progress Notes (Signed)
Walter Boone tolerated blood transfusion and Xgeva injection well without complaints or incident. Calcium 8.9 on 11/13/18 and pt denied any tooth or jaw pain and no recent or future dental visits prior to administering the Xgeva. VSS upon discharge. Pt discharged self ambulatory in satisfactory condition

## 2018-11-15 NOTE — Patient Instructions (Signed)
[place] at [facility] Discharge Instructions  Received 1 unit of blood and Xgeva injection today. Follow-up as scheduled. Call clinic for any questions or concerns   Thank you for choosing [place] at [facility] to provide your oncology and hematology care.  To afford each patient quality time with our provider, please arrive at least 15 minutes before your scheduled appointment time.   If you have a lab appointment with the [facility] please come in thru the  Main Entrance and check in at the main information desk  You need to re-schedule your appointment should you arrive 10 or more minutes late.  We strive to give you quality time with our providers, and arriving late affects you and other patients whose appointments are after yours.  Also, if you no show three or more times for appointments you may be dismissed from the clinic at the providers discretion.     Again, thank you for choosing [facility].  Our hope is that these requests will decrease the amount of time that you wait before being seen by our physicians.       _____________________________________________________________  Should you have questions after your visit to [facility], please contact our office at [phone] between the hours of 8:00 a.m. and 4:30 p.m.  Voicemails left after 4:00 p.m. will not be returned until the following business day.  For prescription refill requests, have your pharmacy contact our office and allow 72 hours.    Cancer Center Support Programs:   > Cancer Support Group  2nd Tuesday of the month 1pm-2pm, Journey Room

## 2018-11-15 NOTE — Progress Notes (Signed)
Blood given today.  Tolerated infusion without adverse affects. Vital signs stable. No complaints at this time. Discharged from clinic ambulatory. F/U with [facility] as scheduled.

## 2018-11-16 LAB — BPAM RBC
Blood Product Expiration Date: 201912232359
ISSUE DATE / TIME: 201912051124
Unit Type and Rh: 600

## 2018-11-16 LAB — TYPE AND SCREEN
ABO/RH(D): A NEG
Antibody Screen: NEGATIVE
Unit division: 0

## 2018-11-27 ENCOUNTER — Ambulatory Visit (INDEPENDENT_AMBULATORY_CARE_PROVIDER_SITE_OTHER): Payer: Medicare HMO | Admitting: Urology

## 2018-11-27 DIAGNOSIS — C61 Malignant neoplasm of prostate: Secondary | ICD-10-CM | POA: Diagnosis not present

## 2018-11-27 DIAGNOSIS — N400 Enlarged prostate without lower urinary tract symptoms: Secondary | ICD-10-CM | POA: Diagnosis not present

## 2018-11-28 ENCOUNTER — Encounter (HOSPITAL_COMMUNITY): Payer: Self-pay | Admitting: Hematology

## 2018-11-28 ENCOUNTER — Other Ambulatory Visit: Payer: Self-pay

## 2018-11-28 ENCOUNTER — Inpatient Hospital Stay (HOSPITAL_BASED_OUTPATIENT_CLINIC_OR_DEPARTMENT_OTHER): Payer: Medicare HMO | Admitting: Hematology

## 2018-11-28 ENCOUNTER — Inpatient Hospital Stay (HOSPITAL_COMMUNITY): Payer: Medicare HMO

## 2018-11-28 VITALS — BP 136/75 | HR 88 | Temp 97.7°F | Resp 18 | Wt 177.4 lb

## 2018-11-28 DIAGNOSIS — M8580 Other specified disorders of bone density and structure, unspecified site: Secondary | ICD-10-CM

## 2018-11-28 DIAGNOSIS — C7951 Secondary malignant neoplasm of bone: Secondary | ICD-10-CM

## 2018-11-28 DIAGNOSIS — Z9221 Personal history of antineoplastic chemotherapy: Secondary | ICD-10-CM | POA: Diagnosis not present

## 2018-11-28 DIAGNOSIS — Z79818 Long term (current) use of other agents affecting estrogen receptors and estrogen levels: Secondary | ICD-10-CM | POA: Diagnosis not present

## 2018-11-28 DIAGNOSIS — R630 Anorexia: Secondary | ICD-10-CM

## 2018-11-28 DIAGNOSIS — Z87891 Personal history of nicotine dependence: Secondary | ICD-10-CM

## 2018-11-28 DIAGNOSIS — E7800 Pure hypercholesterolemia, unspecified: Secondary | ICD-10-CM

## 2018-11-28 DIAGNOSIS — K219 Gastro-esophageal reflux disease without esophagitis: Secondary | ICD-10-CM

## 2018-11-28 DIAGNOSIS — Z800 Family history of malignant neoplasm of digestive organs: Secondary | ICD-10-CM

## 2018-11-28 DIAGNOSIS — Z79899 Other long term (current) drug therapy: Secondary | ICD-10-CM

## 2018-11-28 DIAGNOSIS — Z791 Long term (current) use of non-steroidal anti-inflammatories (NSAID): Secondary | ICD-10-CM

## 2018-11-28 DIAGNOSIS — E785 Hyperlipidemia, unspecified: Secondary | ICD-10-CM

## 2018-11-28 DIAGNOSIS — I10 Essential (primary) hypertension: Secondary | ICD-10-CM

## 2018-11-28 DIAGNOSIS — K5903 Drug induced constipation: Secondary | ICD-10-CM | POA: Diagnosis not present

## 2018-11-28 DIAGNOSIS — R5383 Other fatigue: Secondary | ICD-10-CM

## 2018-11-28 DIAGNOSIS — C61 Malignant neoplasm of prostate: Secondary | ICD-10-CM | POA: Diagnosis not present

## 2018-11-28 DIAGNOSIS — D649 Anemia, unspecified: Secondary | ICD-10-CM | POA: Diagnosis not present

## 2018-11-28 DIAGNOSIS — R110 Nausea: Secondary | ICD-10-CM | POA: Diagnosis not present

## 2018-11-28 DIAGNOSIS — Z923 Personal history of irradiation: Secondary | ICD-10-CM

## 2018-11-28 DIAGNOSIS — M1990 Unspecified osteoarthritis, unspecified site: Secondary | ICD-10-CM

## 2018-11-28 LAB — CBC WITH DIFFERENTIAL/PLATELET
Abs Immature Granulocytes: 1.32 10*3/uL — ABNORMAL HIGH (ref 0.00–0.07)
Basophils Absolute: 0.1 10*3/uL (ref 0.0–0.1)
Basophils Relative: 1 %
Eosinophils Absolute: 0.2 10*3/uL (ref 0.0–0.5)
Eosinophils Relative: 2 %
HCT: 22.5 % — ABNORMAL LOW (ref 39.0–52.0)
Hemoglobin: 6.8 g/dL — CL (ref 13.0–17.0)
Immature Granulocytes: 15 %
LYMPHS PCT: 27 %
Lymphs Abs: 2.4 10*3/uL (ref 0.7–4.0)
MCH: 28.6 pg (ref 26.0–34.0)
MCHC: 30.2 g/dL (ref 30.0–36.0)
MCV: 94.5 fL (ref 80.0–100.0)
Monocytes Absolute: 0.7 10*3/uL (ref 0.1–1.0)
Monocytes Relative: 8 %
Neutro Abs: 4 10*3/uL (ref 1.7–7.7)
Neutrophils Relative %: 47 %
Platelets: 156 10*3/uL (ref 150–400)
RBC: 2.38 MIL/uL — ABNORMAL LOW (ref 4.22–5.81)
RDW: 18.1 % — ABNORMAL HIGH (ref 11.5–15.5)
WBC: 8.6 10*3/uL (ref 4.0–10.5)
nRBC: 2.3 % — ABNORMAL HIGH (ref 0.0–0.2)

## 2018-11-28 LAB — COMPREHENSIVE METABOLIC PANEL
ALT: 11 U/L (ref 0–44)
AST: 20 U/L (ref 15–41)
Albumin: 3.7 g/dL (ref 3.5–5.0)
Alkaline Phosphatase: 139 U/L — ABNORMAL HIGH (ref 38–126)
Anion gap: 10 (ref 5–15)
BUN: 22 mg/dL (ref 8–23)
CHLORIDE: 105 mmol/L (ref 98–111)
CO2: 22 mmol/L (ref 22–32)
CREATININE: 1.01 mg/dL (ref 0.61–1.24)
Calcium: 8.6 mg/dL — ABNORMAL LOW (ref 8.9–10.3)
GFR calc Af Amer: >60 mL/min (ref >60)
GFR calc non Af Amer: >60 mL/min (ref >60)
Glucose, Bld: 168 mg/dL — ABNORMAL HIGH (ref 70–99)
Potassium: 4.2 mmol/L (ref 3.5–5.1)
Sodium: 137 mmol/L (ref 135–145)
Total Bilirubin: 0.5 mg/dL (ref 0.3–1.2)
Total Protein: 7.4 g/dL (ref 6.5–8.1)

## 2018-11-28 LAB — PREPARE RBC (CROSSMATCH)

## 2018-11-28 MED ORDER — PROMETHAZINE HCL 25 MG PO TABS: 25.0000 mg | ORAL_TABLET | Freq: Four times a day (QID) | ORAL | 0 refills | Status: AC | PRN

## 2018-11-28 NOTE — Patient Instructions (Signed)
[facility] at [facility] Discharge Instructions  Follow up in 4 weeks with labs    Thank you for choosing [facility] at [facility] to provide your oncology and hematology care.  To afford each patient quality time with our provider, please arrive at least 15 minutes before your scheduled appointment time.   If you have a lab appointment with the Cancer Center please come in thru the  Main Entrance and check in at the main information desk  You need to re-schedule your appointment should you arrive 10 or more minutes late.  We strive to give you quality time with our providers, and arriving late affects you and other patients whose appointments are after yours.  Also, if you no show three or more times for appointments you may be dismissed from the clinic at the providers discretion.     Again, thank you for choosing [facility].  Our hope is that these requests will decrease the amount of time that you wait before being seen by our physicians.       _____________________________________________________________  Should you have questions after your visit to [facility], please contact our office at [phone] between the hours of 8:00 a.m. and 4:30 p.m.  Voicemails left after 4:00 p.m. will not be returned until the following business day.  For prescription refill requests, have your pharmacy contact our office and allow 72 hours.    Cancer Center Support Programs:   > Cancer Support Group  2nd Tuesday of the month 1pm-2pm, Journey Room    

## 2018-11-28 NOTE — Progress Notes (Signed)
[place], [place] [address]   CLINIC:  Medical Oncology/Hematology  PCP:  Dettinger, Fransisca Kaufmann, MD [place] [address] [phone]   REASON FOR VISIT: Follow-up for metastatic prostate cancer  CURRENT THERAPY: Enzalutamide was started on 10/22/2018, increased to 4 tablets daily  BRIEF ONCOLOGIC HISTORY:    Primary prostate cancer with metastasis from prostate to other site [facility])   06/20/2011 Initial Diagnosis    Prostate cancer ([place])    01/12/2016 Imaging    Bone density- This patient is considered osteopenic by World Healh Organization (WHO) Criteria.     03/16/2016 Imaging    CT CAP- No evidence of metastatic disease within the chest, abdomen, or pelvis. No other acute findings identified.    06/17/2016 Imaging    Bone scan- 1. Negative for metastatic pattern.    07/11/2016 Miscellaneous    Prolia every 6 months for osteopenia.    09/12/2016 -  Chemotherapy    Casodex/Depo-[name] every 3 months     09/27/2016 Imaging    CT CAP -No acute findings and no evidence for metastatic disease within the chest, abdomen or pelvis.    11/13/2017 Progression    Bone Scan: Multiple new abnormal sites of increased osseous tracer accumulation are identified as above consistent with osseous metastatic disease.  These include new foci of uptake at the proximal femora bilaterally.    11/16/2017 Progression    CT CAP-IMPRESSION: 1. New enlarged right pelvic sidewall lymph node and borderline left periaortic lymph node worrisome for metastatic adenopathy. 2. Subtle areas of sclerosis within the lumbar spine are more conspicuous than on the previous exam and worrisome for metastatic disease. Overall the areas of bone metastasis are more better demonstrated on bone scan from 11/13/2017 which demonstrate a progression of bone metastasis.      CANCER STAGING: Cancer Staging Primary prostate cancer with metastasis from prostate to  other site [facility]) Staging form: Prostate, AJCC 7th Edition - Clinical: Stage IIC (pT2c, N0, M0) - Signed by Baird Cancer, PA on 06/20/2011    INTERVAL HISTORY:  Mr. Melucci 67 y.o. male returns for routine follow-up for metastatic prostate cancer. He is here today with his wife. He is still having constipation issues and nausea. He is unable to eat much due to the nausea. He is starting to feel weak and fatigued again. He denies any vomiting or diarrhea. Denies any new pains. Denies any fevers or recent infections. Denies any bleeding or easy bruising. He reports his appetite and energy level at low due to the nausea and anemia.    REVIEW OF SYSTEMS:  Review of Systems  Gastrointestinal: Positive for constipation and nausea.  All other systems reviewed and are negative.    PAST MEDICAL/SURGICAL HISTORY:  Past Medical History:  Diagnosis Date  . Acid reflux   . Alcoholism ([place])   . Arthritis   . Fatty liver   . Gynecomastia, male 01/11/2013   Secondary to prostate ca Tx.   . H/O Bell's palsy   . Hemorrhoids    history  . High cholesterol   . History of back injury 11/19/2013  . Hyperglycemia   . Hyperlipidemia   . Hypertension   . Prostate cancer ([place])   . Prostate cancer ([place]) 06/20/2011  . Prostate carcinoma, recurrent ([place]) 08/10/2012   Past Surgical History:  Procedure Laterality Date  . [place]   left  . COLONOSCOPY  2008  . CYSTOSCOPY WITH  INSERTION OF UROLIFT N/A 10/16/2018   Procedure: CYSTOSCOPY WITH INSERTION OF UROLIFT;  Surgeon: Franchot Gallo, MD;  Location: [facility];  Service: Urology;  Laterality: N/A;  . ESOPHAGOGASTRODUODENOSCOPY    . HAND SURGERY     nerve was torn - left hand  . HERNIA REPAIR  2010  . I&D EXTREMITY Left 06/10/2015   Procedure: IRRIGATION AND DEBRIDEMENT EXTREMITY LEFT HAND EXPLORATION, nerve repair;  Surgeon: Charlotte Crumb, MD;  Location: [facility];  Service: Orthopedics;  Laterality: Left;  . KNEE SURGERY     left  .  PROSTATE BIOPSY  11/06     SOCIAL HISTORY:  Social History   Socioeconomic History  . Marital status: Married    Spouse name: Not on file  . Number of children: 4  . Years of education: Not on file  . Highest education level: GED or equivalent  Occupational History  . Occupation: Disabled  Social Needs  . Financial resource strain: Not very hard  . Food insecurity:    Worry: Sometimes true    Inability: Sometimes true  . Transportation needs:    Medical: No    Non-medical: No  Tobacco Use  . Smoking status: Former Smoker    Packs/day: 2.50    Years: 3.00    Pack years: 7.50  . Smokeless tobacco: Never Used  Substance and Sexual Activity  . Alcohol use: No    Comment: former drinker 20 years ago  . Drug use: No  . Sexual activity: Not Currently  Lifestyle  . Physical activity:    Days per week: 0 days    Minutes per session: 0 min  . Stress: Not at all  Relationships  . Social connections:    Talks on phone: More than three times a week    Gets together: More than three times a week    Attends religious service: Not on file    Active member of club or organization: Not on file    Attends meetings of clubs or organizations: Not on file    Relationship status: Married  . Intimate partner violence:    Fear of current or ex partner: No    Emotionally abused: No    Physically abused: No    Forced sexual activity: No  Other Topics Concern  . Not on file  Social History Narrative  . Not on file    FAMILY HISTORY:  Family History  Problem Relation Age of Onset  . Heart failure Mother   . Diabetes Father   . Cancer Sister   . Diabetes Brother     CURRENT MEDICATIONS:  Outpatient Encounter Medications as of 11/28/2018  Medication Sig  . amLODipine (NORVASC) 10 MG tablet Take 1 tablet (10 mg total) by mouth daily.  . Calcium-Magnesium-Vitamin D (CALCIUM 1200+D3 PO) Take 3 tablets by mouth daily.   . cephALEXin (KEFLEX) 500 MG capsule   . ciprofloxacin  (CIPRO) 250 MG tablet   . diclofenac sodium (VOLTAREN) 1 % GEL Apply 2 g topically 4 (four) times daily. (Patient taking differently: Apply 2 g topically 4 (four) times daily as needed (for pain). )  . enzalutamide (XTANDI) 40 MG capsule Take 4 capsules (160 mg total) by mouth daily.  Marland Kitchen ibuprofen (ADVIL,MOTRIN) 800 MG tablet Take 1 tablet (800 mg total) by mouth every 8 (eight) hours as needed. (Patient taking differently: Take 800 mg by mouth every 8 (eight) hours as needed for headache or moderate pain. )  . lactulose (CHRONULAC) 10 GM/15ML solution  Take 15 mLs (10 g total) by mouth 2 (two) times daily as needed for mild constipation.  Marland Kitchen Leuprolide Acetate ([name] IJ) Inject as directed every 3 (three) months.  . Menthol-Methyl Salicylate (MUSCLE RUB) 10-15 % CREA Apply 1 application topically daily as needed for muscle pain.   . mirtazapine (REMERON) 15 MG tablet Take 1 tablet (15 mg total) by mouth at bedtime.  Marland Kitchen oxyCODONE-acetaminophen (PERCOCET/ROXICET) 5-325 MG tablet Take 1 tablet by mouth every 12 (twelve) hours as needed for severe pain.  . polyethylene glycol powder (GLYCOLAX/MIRALAX) powder Take 17 g by mouth 2 (two) times daily as needed for moderate constipation.   . sulfamethoxazole-trimethoprim (BACTRIM DS,SEPTRA DS) 800-160 MG tablet Take 1 tablet by mouth 2 (two) times daily.  . tamsulosin (FLOMAX) 0.4 MG CAPS capsule Take 1 capsule (0.4 mg total) by mouth 2 (two) times daily.  . traMADol (ULTRAM) 50 MG tablet Take 2 tablets (100 mg total) by mouth every 4 (four) hours as needed. (Patient taking differently: Take 100 mg by mouth every 4 (four) hours as needed for moderate pain. )  . vitamin B-12 (CYANOCOBALAMIN) 500 MCG tablet Take 500 mcg by mouth daily.  . promethazine (PHENERGAN) 25 MG tablet Take 1 tablet (25 mg total) by mouth every 6 (six) hours as needed for nausea or vomiting.   No facility-administered encounter medications on file as of 11/28/2018.     ALLERGIES:  No  Known Allergies   PHYSICAL EXAM:  ECOG Performance status: 1  Vitals:   11/28/18 0950  BP: 136/75  Pulse: 88  Resp: 18  Temp: 97.7 F (36.5 C)  SpO2: 100%   Filed Weights   11/28/18 0950  Weight: 177 lb 6.4 oz (80.5 kg)    Physical Exam Constitutional:      Appearance: Normal appearance. He is normal weight.  Musculoskeletal: Normal range of motion.  Skin:    General: Skin is warm and dry.  Neurological:     Mental Status: He is alert and oriented to person, place, and time. Mental status is at baseline.  Psychiatric:        Mood and Affect: Mood normal.        Behavior: Behavior normal.        Thought Content: Thought content normal.        Judgment: Judgment normal.      LABORATORY DATA:  I have reviewed the labs as listed.  CBC    Component Value Date/Time   WBC 8.6 11/28/2018 0840   RBC 2.38 (L) 11/28/2018 0840   HGB 6.8 (LL) 11/28/2018 0840   HGB 7.3 (LL) 09/06/2018 1101   HCT 22.5 (L) 11/28/2018 0840   HCT 22.4 (L) 09/06/2018 1101   PLT 156 11/28/2018 0840   PLT 113 (L) 09/06/2018 1101   MCV 94.5 11/28/2018 0840   MCV 95 09/06/2018 1101   MCH 28.6 11/28/2018 0840   MCHC 30.2 11/28/2018 0840   RDW 18.1 (H) 11/28/2018 0840   RDW 16.3 (H) 09/06/2018 1101   LYMPHSABS 2.4 11/28/2018 0840   LYMPHSABS 2.2 09/06/2018 1101   MONOABS 0.7 11/28/2018 0840   EOSABS 0.2 11/28/2018 0840   EOSABS 0.2 09/06/2018 1101   BASOSABS 0.1 11/28/2018 0840   BASOSABS 0.1 09/06/2018 1101   CMP Latest Ref Rng & Units 11/28/2018 11/13/2018 11/06/2018  Glucose 70 - 99 mg/dL 168(H) 152(H) 169(H)  BUN 8 - 23 mg/dL _0 Creatinine 0.61 - 1.24 mg/dL 1.01 1.10 0.97  Sodium 135 - 145  mmol/L 137 139 135  Potassium 3.5 - 5.1 mmol/L 4.2 3.5 4.0  Chloride 98 - 111 mmol/L 105 108 104  CO2 22 - 32 mmol/L _0 Calcium 8.9 - 10.3 mg/dL 8.6(L) 8.9 8.7(L)  Total Protein 6.5 - 8.1 g/dL 7.4 7.5 7.5  Total Bilirubin 0.3 - 1.2 mg/dL 0.5 0.5 0.7  Alkaline Phos 38 - 126 U/L  139(H) 142(H) 179(H)  AST 15 - 41 U/L 20 18 34  ALT 0 - 44 U/L _1 DIAGNOSTIC IMAGING:  I have independently reviewed the scans and discussed with the patient.   I have reviewed Francene Finders, NP's note and agree with the documentation.  I personally performed a face-to-face visit, made revisions and my assessment and plan is as follows.    ASSESSMENT & PLAN:   Primary prostate cancer with metastasis from prostate to other site [facility]) 1.  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 - Zytiga and prednisone started in January 2019. -Patient could not tolerate it well secondary to nausea and severe tiredness.  Has not been taking them as prescribed.  He completely discontinued it on May 17 when he went to visit family in [place]. -Last bone scan and CT scan on 04/27/2018 shows worsening bone metastasis with no visceral metastasis. - At last visit in July I have recommended him to go back on Abiraterone.  He started back at lower dose of 500 mg daily and discontinued it in August secondary to intolerance from nausea and severe tiredness. - He was recently evaluated in our clinic and was found to have severely low blood.  He also complained of lightheadedness.  He received 2 units of blood transfusion. -I have reviewed his smear which showed leukoerythroblastic blood picture with nucleated RBC and left shift.  I have recommended bone marrow aspiration and biopsy.  His bone marrow is likely infiltrated by prostate cancer. -Last PSA on 09/25/2018 was 254. - I have recommended enzalutamide or chemotherapy with docetaxel.  He preferred enzalutamide. -UroLift procedure was done on 10/16/2018 which is helping his urine flow.  He does not have to self catheterize anymore. -Bone marrow  biopsy on 10/11/2018 shows complete infiltration by prostate cancer cells. -Enzalutamide was started on 10/22/2018, increased to 4 tablets daily.  He is tolerating well.  - Last blood transfusion was on 11/14/2018, 1 unit. - We discussed labs today.  Hemoglobin is 6.8.  Platelet count is 156.  I have recommended 2 units of blood transfusion. - He has been having nausea and not eating much.  We will send in a prescription for Phenergan to be taken in the mornings.  He will take it in the evenings if needed. - He also had constipation since he started enzalutamide.  He tried taking 1 tablet of Colace which did not help.  He found an over-the-counter medicine which helps.  He will continue that.  Otherwise will consider lactulose daily. -I will see him back in 4 weeks for follow-up.  I plan to repeat PSA at that time.  2.  Bone metastasis: - He was started back on Xgeva on 11/15/2018.  He will continue calcium and vitamin D supplements.  3.  Family history: - Sister died of metastatic cancer.  Primary is not known to the patient.  Another sister had colon cancer.  We will refer him for genetic evaluation for BRCA 1/2, PALB2 and other mutations.  If he is positive it will give [place] another option (PARP-1 inhibitor) for treatment.      Orders placed this encounter:  Orders Placed This Encounter  Procedures  . PSA  . CBC with Differential/Platelet  . Comprehensive metabolic panel  . Practitioner attestation of consent  . Complete patient signature process for consent form  . Care order/instruction  . Type and screen  . Prepare RBC      [name], MD [place] [phone]

## 2018-11-28 NOTE — Assessment & Plan Note (Signed)
1.  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 - Zytiga and prednisone started in January 2019. -Patient could not tolerate it well secondary to nausea and severe tiredness.  Has not been taking them as prescribed.  He completely discontinued it on May 17 when he went to visit family in [place]. -Last bone scan and CT scan on 04/27/2018 shows worsening bone metastasis with no visceral metastasis. - At last visit in July I have recommended him to go back on Abiraterone.  He started back at lower dose of 500 mg daily and discontinued it in August secondary to intolerance from nausea and severe tiredness. - He was recently evaluated in our clinic and was found to have severely low blood.  He also complained of lightheadedness.  He received 2 units of blood transfusion. -I have reviewed his smear which showed leukoerythroblastic blood picture with nucleated RBC and left shift.  I have recommended bone marrow aspiration and biopsy.  His bone marrow is likely infiltrated by prostate cancer. -Last PSA on 09/25/2018 was 254. - I have recommended enzalutamide or chemotherapy with docetaxel.  He preferred enzalutamide. -UroLift procedure was done on 10/16/2018 which is helping his urine flow.  He does not have to self catheterize anymore. -Bone marrow biopsy on 10/11/2018 shows complete infiltration by prostate cancer cells. -Enzalutamide was started on 10/22/2018, increased to 4 tablets daily.  He is tolerating well.  - Last blood transfusion was on 11/14/2018, 1 unit. - We discussed labs today.  Hemoglobin is 6.8.  Platelet count is 156.  I have recommended 2 units of blood transfusion. - He has been having nausea and not eating much.  We will send in a prescription for Phenergan to be taken in the mornings.   He will take it in the evenings if needed. - He also had constipation since he started enzalutamide.  He tried taking 1 tablet of Colace which did not help.  He found an over-the-counter medicine which helps.  He will continue that.  Otherwise will consider lactulose daily. -I will see him back in 4 weeks for follow-up.  I plan to repeat PSA at that time.  2.  Bone metastasis: - He was started back on Xgeva on 11/15/2018.  He will continue calcium and vitamin D supplements.  3.  Family history: - Sister died of metastatic cancer.  Primary is not known to the patient.  Another sister had colon cancer.  We will refer him for genetic evaluation for BRCA 1/2, PALB2 and other mutations.  If he is positive it will give [place] another option (PARP-1 inhibitor) for treatment.

## 2018-11-29 ENCOUNTER — Ambulatory Visit: Payer: Medicare HMO | Admitting: Family Medicine

## 2018-11-29 ENCOUNTER — Inpatient Hospital Stay (HOSPITAL_COMMUNITY): Payer: Medicare HMO

## 2018-11-29 DIAGNOSIS — C61 Malignant neoplasm of prostate: Secondary | ICD-10-CM | POA: Diagnosis not present

## 2018-11-29 DIAGNOSIS — K5903 Drug induced constipation: Secondary | ICD-10-CM | POA: Diagnosis not present

## 2018-11-29 DIAGNOSIS — R110 Nausea: Secondary | ICD-10-CM | POA: Diagnosis not present

## 2018-11-29 DIAGNOSIS — R5383 Other fatigue: Secondary | ICD-10-CM | POA: Diagnosis not present

## 2018-11-29 DIAGNOSIS — Z79818 Long term (current) use of other agents affecting estrogen receptors and estrogen levels: Secondary | ICD-10-CM | POA: Diagnosis not present

## 2018-11-29 DIAGNOSIS — Z923 Personal history of irradiation: Secondary | ICD-10-CM | POA: Diagnosis not present

## 2018-11-29 DIAGNOSIS — Z9221 Personal history of antineoplastic chemotherapy: Secondary | ICD-10-CM | POA: Diagnosis not present

## 2018-11-29 DIAGNOSIS — D649 Anemia, unspecified: Secondary | ICD-10-CM | POA: Diagnosis not present

## 2018-11-29 DIAGNOSIS — C7951 Secondary malignant neoplasm of bone: Secondary | ICD-10-CM | POA: Diagnosis not present

## 2018-11-29 MED ORDER — ACETAMINOPHEN 325 MG PO TABS
ORAL_TABLET | ORAL | Status: AC
  Filled 2018-11-29: qty 2

## 2018-11-29 MED ORDER — ACETAMINOPHEN 325 MG PO TABS
650.0000 mg | ORAL_TABLET | Freq: Once | ORAL | Status: AC
  Administered 2018-11-29: 650 mg via ORAL

## 2018-11-29 MED ORDER — DIPHENHYDRAMINE HCL 25 MG PO CAPS
25.0000 mg | ORAL_CAPSULE | Freq: Once | ORAL | Status: AC
  Administered 2018-11-29: 25 mg via ORAL

## 2018-11-29 MED ORDER — SODIUM CHLORIDE 0.9% IV SOLUTION
250.0000 mL | Freq: Once | INTRAVENOUS | Status: AC
  Administered 2018-11-29: 250 mL via INTRAVENOUS

## 2018-11-29 MED ORDER — DIPHENHYDRAMINE HCL 25 MG PO CAPS
ORAL_CAPSULE | ORAL | Status: AC
  Filled 2018-11-29: qty 1

## 2018-11-29 NOTE — Patient Instructions (Signed)
[facility] [place] received 2 units of blood today. _______________________________________________________________  Thank you for choosing [place] at [facility] to provide your oncology and hematology care.  To afford each patient quality time with our providers, please arrive at least 15 minutes before your scheduled appointment.  You need to re-schedule your appointment if you arrive 10 or more minutes late.  We strive to give you quality time with our providers, and arriving late affects you and other patients whose appointments are after yours.  Also, if you no show three or more times for appointments you may be dismissed from the clinic.  Again, thank you for choosing [place] at [facility] hope is that these requests will allow you access to exceptional care and in a timely manner. _______________________________________________________________  If you have questions after your visit, please contact our office at [phone] between the hours of 8:30 a.m. and 5:00 p.m. Voicemails left after 4:30 p.m. will not be returned until the following business day. _______________________________________________________________  For prescription refill requests, have your pharmacy contact our office. _______________________________________________________________  Recommendations made by the consultant and any test results will be sent to your referring physician. _______________________________________________________________

## 2018-11-29 NOTE — Progress Notes (Signed)
Walter Boone tolerated PRBC transfusion without incident or complaint. VSS. Discharged self ambulatory in satisfactory condition.

## 2018-11-30 LAB — BPAM RBC
BLOOD PRODUCT EXPIRATION DATE: 201912232359
Blood Product Expiration Date: 202001042359
ISSUE DATE / TIME: 201912190857
ISSUE DATE / TIME: 201912191102
Unit Type and Rh: 600
Unit Type and Rh: 600

## 2018-11-30 LAB — TYPE AND SCREEN
ABO/RH(D): A NEG
Antibody Screen: NEGATIVE
Unit division: 0
Unit division: 0

## 2018-11-30 NOTE — Progress Notes (Unsigned)
CRITICAL VALUE ALERT  Critical Value:  Hgb 6.8  Date & Time Notied:  11/28/18 at [place]  Provider Notified: Dr. Delton Coombes  Orders Received/Actions taken: orders rec'd to transfuse 2 units PRBC.

## 2018-12-10 ENCOUNTER — Other Ambulatory Visit (HOSPITAL_COMMUNITY): Payer: Self-pay

## 2018-12-10 ENCOUNTER — Ambulatory Visit (INDEPENDENT_AMBULATORY_CARE_PROVIDER_SITE_OTHER): Payer: Medicare HMO | Admitting: Family Medicine

## 2018-12-10 ENCOUNTER — Encounter: Payer: Self-pay | Admitting: Family Medicine

## 2018-12-10 VITALS — BP 128/73 | HR 79 | Temp 97.3°F | Ht 66.0 in | Wt 178.6 lb

## 2018-12-10 DIAGNOSIS — Z23 Encounter for immunization: Secondary | ICD-10-CM

## 2018-12-10 DIAGNOSIS — D649 Anemia, unspecified: Secondary | ICD-10-CM | POA: Diagnosis not present

## 2018-12-10 DIAGNOSIS — C61 Malignant neoplasm of prostate: Secondary | ICD-10-CM

## 2018-12-10 DIAGNOSIS — D638 Anemia in other chronic diseases classified elsewhere: Secondary | ICD-10-CM

## 2018-12-10 DIAGNOSIS — C7951 Secondary malignant neoplasm of bone: Secondary | ICD-10-CM

## 2018-12-10 DIAGNOSIS — R634 Abnormal weight loss: Secondary | ICD-10-CM | POA: Diagnosis not present

## 2018-12-10 MED ORDER — FERROUS SULFATE 325 (65 FE) MG PO TABS: 325.0000 mg | ORAL_TABLET | Freq: Every day | ORAL | 3 refills | Status: AC

## 2018-12-10 MED ORDER — EQUATE PLUS PO LIQD: 1.0000 | Freq: Two times a day (BID) | ORAL | 11 refills | Status: AC

## 2018-12-10 MED ORDER — VITAMIN C 500 MG PO TABS: 500.0000 mg | ORAL_TABLET | Freq: Every day | ORAL | 3 refills | Status: DC

## 2018-12-10 MED ORDER — HYDROCODONE-ACETAMINOPHEN 10-325 MG PO TABS: 1.0000 | ORAL_TABLET | Freq: Three times a day (TID) | ORAL | 0 refills | Status: DC | PRN

## 2018-12-10 MED ORDER — KETOROLAC TROMETHAMINE 60 MG/2ML IM SOLN
60.0000 mg | Freq: Once | INTRAMUSCULAR | Status: AC
  Administered 2018-12-10: 60 mg via INTRAMUSCULAR

## 2018-12-10 NOTE — Progress Notes (Signed)
BP 128/73   Pulse 79   Temp (!) 97.3 F (36.3 C) (Oral)   Ht 5\' 6"  (1.676 m)   Wt 178 lb 9.6 oz (81 kg)   BMI 28.83 kg/m    Subjective:    [name], male    DOB: Jun 27, 1951, 67 y.o.   MRN: [id number]  HPI: Walter Boone is a 67 y.o. male presenting on 12/10/2018 for Bone Pain (3 week follow up- Patient states that on his left arm he feels a lot of tingling. States that the Tramadol is not helping.) and Anemia   HPI Bone pain secondary to cancer Patient says the tramadol is not working for him, we will try something stronger, he has not been tolerant of strong pain medications previously so we will start in a stepwise progression and go to hydrocodone next.  Patient is also been losing weight secondary to his cancer and has not been eating well and his doctor recommended Ensure or boost to help with this.  He says is too expensive and he cannot afford it.  He says occasionally with his pain in his left shoulder he will get tingling going down his hand on that side as well.  He says that he has been not sleeping in days because of his pain.  Relevant past medical, surgical, family and social history reviewed and updated as indicated. Interim medical history since our last visit reviewed. Allergies and medications reviewed and updated.  Review of Systems  Constitutional: Negative for chills and fever.  Eyes: Negative for visual disturbance.  Respiratory: Negative for shortness of breath and wheezing.   Cardiovascular: Negative for chest pain and leg swelling.  Musculoskeletal: Positive for arthralgias, back pain and neck pain. Negative for gait problem.  Skin: Negative for rash.  Neurological: Positive for numbness.  All other systems reviewed and are negative.   Per HPI unless specifically indicated above   Allergies as of 12/10/2018   No Known Allergies     Medication List       Accurate as of December 10, 2018  2:17 PM. Always use your most  recent med list.        amLODipine 10 MG tablet Commonly known as:  NORVASC Take 1 tablet (10 mg total) by mouth daily.   CALCIUM 1200+D3 PO Take 3 tablets by mouth daily.   diclofenac sodium 1 % Gel Commonly known as:  VOLTAREN Apply 2 g topically 4 (four) times daily.   enzalutamide 40 MG capsule Commonly known as:  XTANDI Take 4 capsules (160 mg total) by mouth daily.   EQUATE PLUS Liqd Take 1 Can by mouth 2 (two) times daily.   ferrous sulfate 325 (65 FE) MG tablet Commonly known as:  FERROUSUL Take 1 tablet (325 mg total) by mouth daily with breakfast.   HYDROcodone-acetaminophen 10-325 MG tablet Commonly known as:  NORCO Take 1 tablet by mouth every 8 (eight) hours as needed.   ibuprofen 800 MG tablet Commonly known as:  ADVIL,MOTRIN Take 1 tablet (800 mg total) by mouth every 8 (eight) hours as needed.   lactulose 10 GM/15ML solution Commonly known as:  CHRONULAC Take 15 mLs (10 g total) by mouth 2 (two) times daily as needed for mild constipation.   [name] IJ Inject as directed every 3 (three) months.   mirtazapine 15 MG tablet Commonly known as:  REMERON Take 1 tablet (15 mg total) by mouth at bedtime.   MUSCLE RUB 10-15 % Crea Apply  1 application topically daily as needed for muscle pain.   polyethylene glycol powder powder Commonly known as:  GLYCOLAX/MIRALAX Take 17 g by mouth 2 (two) times daily as needed for moderate constipation.   promethazine 25 MG tablet Commonly known as:  PHENERGAN Take 1 tablet (25 mg total) by mouth every 6 (six) hours as needed for nausea or vomiting.   traMADol 50 MG tablet Commonly known as:  ULTRAM Take 2 tablets (100 mg total) by mouth every 4 (four) hours as needed.   vitamin B-12 500 MCG tablet Commonly known as:  CYANOCOBALAMIN Take 500 mcg by mouth daily.   vitamin C 500 MG tablet Commonly known as:  ASCORBIC ACID Take 1 tablet (500 mg total) by mouth daily. Take with iron for better absorption           Objective:    BP 128/73   Pulse 79   Temp (!) 97.3 F (36.3 C) (Oral)   Ht 5\' 6"  (1.676 m)   Wt 178 lb 9.6 oz (81 kg)   BMI 28.83 kg/m   Wt Readings from Last 3 Encounters:  12/10/18 178 lb 9.6 oz (81 kg)  11/28/18 177 lb 6.4 oz (80.5 kg)  11/15/18 178 lb 6.4 oz (80.9 kg)    Physical Exam Vitals signs and nursing note reviewed.  Constitutional:      General: He is not in acute distress.    Appearance: He is well-developed. He is not diaphoretic.  Eyes:     General: No scleral icterus.    Conjunctiva/sclera: Conjunctivae normal.  Neck:     Musculoskeletal: Neck supple.     Thyroid: No thyromegaly.  Cardiovascular:     Rate and Rhythm: Normal rate and regular rhythm.     Heart sounds: Normal heart sounds. No murmur.  Pulmonary:     Effort: Pulmonary effort is normal. No respiratory distress.     Breath sounds: Normal breath sounds. No wheezing.  Musculoskeletal: Normal range of motion.        General: No tenderness (No tenderness on exam to palpation).  Lymphadenopathy:     Cervical: No cervical adenopathy.  Skin:    General: Skin is warm and dry.     Findings: No rash.  Neurological:     Mental Status: He is alert and oriented to person, place, and time.     Coordination: Coordination normal.  Psychiatric:        Behavior: Behavior normal.         Assessment & Plan:   Problem List Items Addressed This Visit      Genitourinary   Primary prostate cancer with metastasis from prostate to other site [facility])    Other Visit Diagnoses    Bony metastasis ([place])    -  Primary   Relevant Medications   ketorolac (TORADOL) injection 60 mg   HYDROcodone-acetaminophen (NORCO) 10-325 MG tablet   Anemia of chronic disease       Relevant Medications   ferrous sulfate (FERROUSUL) 325 (65 FE) MG tablet   vitamin C (ASCORBIC ACID) 500 MG tablet   Other Relevant Orders   Anemia Profile B   Weight loss       Due to chemotherapy agent,   Relevant Medications    Nutritional Supplements (EQUATE PLUS) LIQD    Will recheck iron levels with full panel and do ferrous sulfate.  Will change from tramadol to Norco and give him a Toradol injection to see if it helps him today and reduce  the inflammation around the nerve impingement that he must be having due to his symptoms.  Prescribed weight plus for weight loss secondary to cancer.  Follow up plan: Return in about 4 weeks (around 01/07/2019), or if symptoms worsen or fail to improve, for Recheck pain and weight.  Counseling provided for all of the vaccine components Orders Placed This Encounter  Procedures  . Anemia Profile B    Caryl Pina, MD [facility] 12/10/2018, 2:17 PM

## 2018-12-10 NOTE — Addendum Note (Signed)
Addended by: Nigel Berthold C on: 12/10/2018 02:25 PM   Modules accepted: Orders

## 2018-12-11 ENCOUNTER — Other Ambulatory Visit (HOSPITAL_COMMUNITY): Payer: Self-pay | Admitting: Nurse Practitioner

## 2018-12-11 DIAGNOSIS — C61 Malignant neoplasm of prostate: Secondary | ICD-10-CM

## 2018-12-11 LAB — ANEMIA PROFILE B
Basophils Absolute: 0.1 10*3/uL (ref 0.0–0.2)
Basos: 1 %
EOS (ABSOLUTE): 0.3 10*3/uL (ref 0.0–0.4)
Eos: 6 %
FOLATE: 6.6 ng/mL (ref >3.0)
Ferritin: 1719 ng/mL — ABNORMAL HIGH (ref 30–400)
Hematocrit: 23.6 % — ABNORMAL LOW (ref 37.5–51.0)
Hemoglobin: 8 g/dL — CL (ref 13.0–17.7)
Iron Saturation: 57 % — ABNORMAL HIGH (ref 15–55)
Iron: 161 ug/dL (ref 38–169)
LYMPHS ABS: 0.9 10*3/uL (ref 0.7–3.1)
Lymphs: 17 %
MCH: 29.5 pg (ref 26.6–33.0)
MCHC: 33.9 g/dL (ref 31.5–35.7)
MCV: 87 fL (ref 79–97)
Monocytes Absolute: 0.2 10*3/uL (ref 0.1–0.9)
Monocytes: 4 %
NEUTROS PCT: 66 %
NRBC: 1 % — ABNORMAL HIGH (ref 0–0)
Neutrophils Absolute: 3.6 10*3/uL (ref 1.4–7.0)
Platelets: 129 10*3/uL — ABNORMAL LOW (ref 150–450)
RBC: 2.71 x10E6/uL — CL (ref 4.14–5.80)
RDW: 17.6 % — ABNORMAL HIGH (ref 12.3–15.4)
Retic Ct Pct: 1.7 % (ref 0.6–2.6)
Total Iron Binding Capacity: 281 ug/dL (ref 250–450)
UIBC: 120 ug/dL (ref 111–343)
Vitamin B-12: >2000 pg/mL — ABNORMAL HIGH (ref 232–1245)
WBC: 5.4 10*3/uL (ref 3.4–10.8)

## 2018-12-11 LAB — IMMATURE CELLS: METAMYELOCYTES: 6 % — AB (ref 0–0)

## 2018-12-11 NOTE — Telephone Encounter (Signed)
Oral Oncology Patient Advocate Encounter   Was successful in securing patient an $20 grant from [facility] [place]) to provide copayment coverage for [place].  This will keep the out of pocket expense at $0.     I have spoken with the patient.    The billing information is as follows and has been shared with [employer].   Member ID: [id number] Group ID: [id number] RxBin: [id number] Dates of Eligibility: 07/17/18 through 10/15/19

## 2018-12-13 ENCOUNTER — Inpatient Hospital Stay (HOSPITAL_COMMUNITY): Payer: Medicare HMO | Attending: Hematology

## 2018-12-13 ENCOUNTER — Inpatient Hospital Stay (HOSPITAL_COMMUNITY): Payer: Medicare HMO

## 2018-12-13 VITALS — BP 132/62 | HR 98 | Temp 98.3°F | Resp 18

## 2018-12-13 DIAGNOSIS — C61 Malignant neoplasm of prostate: Secondary | ICD-10-CM

## 2018-12-13 DIAGNOSIS — M8580 Other specified disorders of bone density and structure, unspecified site: Secondary | ICD-10-CM

## 2018-12-13 DIAGNOSIS — Z923 Personal history of irradiation: Secondary | ICD-10-CM | POA: Insufficient documentation

## 2018-12-13 DIAGNOSIS — Z9221 Personal history of antineoplastic chemotherapy: Secondary | ICD-10-CM | POA: Diagnosis not present

## 2018-12-13 DIAGNOSIS — Z79818 Long term (current) use of other agents affecting estrogen receptors and estrogen levels: Secondary | ICD-10-CM | POA: Diagnosis not present

## 2018-12-13 DIAGNOSIS — C7951 Secondary malignant neoplasm of bone: Secondary | ICD-10-CM | POA: Insufficient documentation

## 2018-12-13 LAB — COMPREHENSIVE METABOLIC PANEL
ALK PHOS: 177 U/L — AB (ref 38–126)
ALT: 10 U/L (ref 0–44)
AST: 24 U/L (ref 15–41)
Albumin: 3.6 g/dL (ref 3.5–5.0)
Anion gap: 8 (ref 5–15)
BUN: 28 mg/dL — ABNORMAL HIGH (ref 8–23)
CO2: 21 mmol/L — ABNORMAL LOW (ref 22–32)
Calcium: 8.3 mg/dL — ABNORMAL LOW (ref 8.9–10.3)
Chloride: 107 mmol/L (ref 98–111)
Creatinine, Ser: 0.97 mg/dL (ref 0.61–1.24)
GFR calc Af Amer: >60 mL/min (ref >60)
GFR calc non Af Amer: >60 mL/min (ref >60)
Glucose, Bld: 128 mg/dL — ABNORMAL HIGH (ref 70–99)
Potassium: 4 mmol/L (ref 3.5–5.1)
Sodium: 136 mmol/L (ref 135–145)
Total Bilirubin: 0.5 mg/dL (ref 0.3–1.2)
Total Protein: 7.1 g/dL (ref 6.5–8.1)

## 2018-12-13 MED ORDER — DENOSUMAB 120 MG/1.7ML ~~LOC~~ SOLN
120.0000 mg | Freq: Once | SUBCUTANEOUS | Status: AC
  Administered 2018-12-13: 120 mg via SUBCUTANEOUS
  Filled 2018-12-13: qty 1.7

## 2018-12-13 NOTE — Patient Instructions (Signed)
[place] at [facility]  Discharge Instructions:  You received Walter Boone today _______________________________________________________________  Thank you for choosing [place] at [facility] to provide your oncology and hematology care.  To afford each patient quality time with our providers, please arrive at least 15 minutes before your scheduled appointment.  You need to re-schedule your appointment if you arrive 10 or more minutes late.  We strive to give you quality time with our providers, and arriving late affects you and other patients whose appointments are after yours.  Also, if you no show three or more times for appointments you may be dismissed from the clinic.  Again, thank you for choosing [facility] [place] [facility] [place] hope is that these requests will allow you access to exceptional care and in a timely manner. _______________________________________________________________  If you have questions after your visit, please contact our office at [phone] between the hours of 8:30 a.m. and 5:00 p.m. Voicemails left after 4:30 p.m. will not be returned until the following business day. _______________________________________________________________  For prescription refill requests, have your pharmacy contact our office. _______________________________________________________________  Recommendations made by the consultant and any test results will be sent to your referring physician. _______________________________________________________________

## 2018-12-13 NOTE — Progress Notes (Signed)
.  Walter Boone tolerated Xgeva without incident or complaint. VSS. Ca 8.3 and pt reports he is taking his Ca and Vit D as instructed. Denies jaw or tooth pain and no recent dental work. Discharged self ambulatory in satisfactory condition in presence of wife.

## 2018-12-14 ENCOUNTER — Telehealth (HOSPITAL_COMMUNITY): Payer: Self-pay

## 2018-12-14 NOTE — Telephone Encounter (Signed)
Nutrition Assessment   Reason for Assessment:  Patient identified on Malnutrition Screening report for weight loss and poor appetite   ASSESSMENT:  68 year old male with prostate cancer with mets to bone.  Patient currently on enzalutamide.  Past medical history of GERD, HLD, prediabetes.  Patient returned phone call.  Introduced self and service.  Patient reports no appetite and some nausea are the 2 biggest thing effecting appetite.  Reports that he does not eat until around 2pm daily (mostly vegetables, fruits, sometimes hamburger but not eating much meat).  Has been trying to drink 2 of equate plus shakes daily for added nutrition.     Medications: phenergan (thinks he is taking this) Calcium and vit d, fe sulfate, lactulose, remeron,miralax, Vit B 12, Vit C  Labs: reviewed   Anthropometrics:   Height: 66 inches Weight: 178 lb 9.6 oz Noted weight of 185 lb on 09/02/18 BMI: 28  4% weight loss in the last 3 months, not significant   NUTRITION DIAGNOSIS: Inadequate oral intake related to cancer and cancer related treatment side effects as evidenced by nausea, lack of appetite and 4 % weight loss   MALNUTRITION DIAGNOSIS: none at this time   INTERVENTION:  Encouraged patient to utilize nausea medication to prevent symptoms and allow patient to eat. Briefly discussed strategies to add calories and protein to current diet.  Discussed ensure program with patient and will provided case at next visit. Contact information given    MONITORING, EVALUATION, GOAL: Patient will consume adequate calories and protein to prevent further weight loss   Next Visit: Friday, Jan 17 following MD appointment  Leianne Callins B. Zenia Resides, [place], [place] Registered Dietitian [phone] (pager)

## 2018-12-14 NOTE — Telephone Encounter (Signed)
Nutrition  Patient identified on Malnutrition Screening report for weight loss and poor appetite.  Called patient and left message with wife.  Wife reports patient is currently sleeping.  Left contact information for patient to call RD back.  Aileana Hodder B. Zenia Resides, [place], [place] Registered Dietitian [phone] (pager)

## 2018-12-17 MED FILL — XTANDI 40 MG CAPSULE: 40 | 30 days supply | Qty: 120 | Fill #0

## 2018-12-28 ENCOUNTER — Emergency Department (HOSPITAL_COMMUNITY): Payer: Medicare HMO

## 2018-12-28 ENCOUNTER — Observation Stay (HOSPITAL_COMMUNITY)
Admission: EM | Admit: 2018-12-28 | Discharge: 2018-12-29 | Disposition: A | Discharge status: Home / Self Care | Payer: Medicare HMO | Attending: Internal Medicine | Admitting: Internal Medicine

## 2018-12-28 ENCOUNTER — Inpatient Hospital Stay (HOSPITAL_COMMUNITY): Payer: Medicare HMO

## 2018-12-28 ENCOUNTER — Inpatient Hospital Stay (HOSPITAL_COMMUNITY): Payer: Medicare HMO | Admitting: Hematology

## 2018-12-28 ENCOUNTER — Encounter (HOSPITAL_COMMUNITY): Payer: Self-pay | Admitting: Hematology

## 2018-12-28 ENCOUNTER — Observation Stay (HOSPITAL_COMMUNITY): Payer: Medicare HMO

## 2018-12-28 ENCOUNTER — Other Ambulatory Visit: Payer: Self-pay

## 2018-12-28 ENCOUNTER — Encounter (HOSPITAL_COMMUNITY): Payer: Self-pay | Admitting: Emergency Medicine

## 2018-12-28 DIAGNOSIS — C61 Malignant neoplasm of prostate: Secondary | ICD-10-CM | POA: Diagnosis not present

## 2018-12-28 DIAGNOSIS — Z923 Personal history of irradiation: Secondary | ICD-10-CM | POA: Diagnosis not present

## 2018-12-28 DIAGNOSIS — G893 Neoplasm related pain (acute) (chronic): Secondary | ICD-10-CM | POA: Insufficient documentation

## 2018-12-28 DIAGNOSIS — C7951 Secondary malignant neoplasm of bone: Secondary | ICD-10-CM | POA: Diagnosis not present

## 2018-12-28 DIAGNOSIS — D649 Anemia, unspecified: Secondary | ICD-10-CM | POA: Diagnosis not present

## 2018-12-28 DIAGNOSIS — Z79899 Other long term (current) drug therapy: Secondary | ICD-10-CM | POA: Insufficient documentation

## 2018-12-28 DIAGNOSIS — D696 Thrombocytopenia, unspecified: Secondary | ICD-10-CM | POA: Insufficient documentation

## 2018-12-28 DIAGNOSIS — Z87891 Personal history of nicotine dependence: Secondary | ICD-10-CM | POA: Insufficient documentation

## 2018-12-28 DIAGNOSIS — R0600 Dyspnea, unspecified: Secondary | ICD-10-CM

## 2018-12-28 DIAGNOSIS — R079 Chest pain, unspecified: Secondary | ICD-10-CM

## 2018-12-28 DIAGNOSIS — I10 Essential (primary) hypertension: Secondary | ICD-10-CM

## 2018-12-28 DIAGNOSIS — R110 Nausea: Secondary | ICD-10-CM | POA: Diagnosis not present

## 2018-12-28 DIAGNOSIS — R0789 Other chest pain: Secondary | ICD-10-CM

## 2018-12-28 DIAGNOSIS — Z8546 Personal history of malignant neoplasm of prostate: Secondary | ICD-10-CM | POA: Insufficient documentation

## 2018-12-28 DIAGNOSIS — R531 Weakness: Secondary | ICD-10-CM | POA: Diagnosis not present

## 2018-12-28 DIAGNOSIS — Z79818 Long term (current) use of other agents affecting estrogen receptors and estrogen levels: Secondary | ICD-10-CM | POA: Diagnosis not present

## 2018-12-28 DIAGNOSIS — Z9221 Personal history of antineoplastic chemotherapy: Secondary | ICD-10-CM | POA: Diagnosis not present

## 2018-12-28 DIAGNOSIS — D62 Acute posthemorrhagic anemia: Secondary | ICD-10-CM | POA: Diagnosis present

## 2018-12-28 HISTORY — DX: Diverticulosis of intestine, part unspecified, without perforation or abscess without bleeding: K57.90

## 2018-12-28 HISTORY — DX: Other chronic pain: G89.29

## 2018-12-28 HISTORY — DX: Dorsalgia, unspecified: M54.9

## 2018-12-28 HISTORY — DX: Secondary malignant neoplasm of bone: C79.51

## 2018-12-28 LAB — CBC WITH DIFFERENTIAL/PLATELET
Abs Immature Granulocytes: 0.41 10*3/uL — ABNORMAL HIGH (ref 0.00–0.07)
Abs Immature Granulocytes: 0.39 10*3/uL — ABNORMAL HIGH (ref 0.00–0.07)
Basophils Absolute: 0 10*3/uL (ref 0.0–0.1)
Basophils Absolute: 0 10*3/uL (ref 0.0–0.1)
Basophils Relative: 1 %
Basophils Relative: 0 %
Eosinophils Absolute: 0 10*3/uL (ref 0.0–0.5)
Eosinophils Absolute: 0.1 10*3/uL (ref 0.0–0.5)
Eosinophils Relative: 1 %
Eosinophils Relative: 2 %
HCT: 17.8 % — ABNORMAL LOW (ref 39.0–52.0)
HCT: 16.1 % — ABNORMAL LOW (ref 39.0–52.0)
Hemoglobin: 5.2 g/dL — CL (ref 13.0–17.0)
Hemoglobin: 4.8 g/dL — CL (ref 13.0–17.0)
Immature Granulocytes: 8 %
Immature Granulocytes: 8 %
Lymphocytes Relative: 17 %
Lymphocytes Relative: 19 %
Lymphs Abs: 0.8 10*3/uL (ref 0.7–4.0)
Lymphs Abs: 0.9 10*3/uL (ref 0.7–4.0)
MCH: 28 pg (ref 26.0–34.0)
MCH: 28.4 pg (ref 26.0–34.0)
MCHC: 29.2 g/dL — ABNORMAL LOW (ref 30.0–36.0)
MCHC: 29.8 g/dL — ABNORMAL LOW (ref 30.0–36.0)
MCV: 95.7 fL (ref 80.0–100.0)
MCV: 95.3 fL (ref 80.0–100.0)
MONOS PCT: 15 %
Monocytes Absolute: 0.6 10*3/uL (ref 0.1–1.0)
Monocytes Absolute: 0.7 10*3/uL (ref 0.1–1.0)
Monocytes Relative: 12 %
NEUTROS PCT: 61 %
Neutro Abs: 3 10*3/uL (ref 1.7–7.7)
Neutro Abs: 2.6 10*3/uL (ref 1.7–7.7)
Neutrophils Relative %: 56 %
Platelets: 159 10*3/uL (ref 150–400)
Platelets: 152 10*3/uL (ref 150–400)
RBC: 1.86 MIL/uL — ABNORMAL LOW (ref 4.22–5.81)
RBC: 1.69 MIL/uL — ABNORMAL LOW (ref 4.22–5.81)
RDW: 20.5 % — ABNORMAL HIGH (ref 11.5–15.5)
RDW: 20.4 % — ABNORMAL HIGH (ref 11.5–15.5)
WBC: 4.9 10*3/uL (ref 4.0–10.5)
WBC: 4.7 10*3/uL (ref 4.0–10.5)
nRBC: 3.5 % — ABNORMAL HIGH (ref 0.0–0.2)
nRBC: 2.4 % — ABNORMAL HIGH (ref 0.0–0.2)

## 2018-12-28 LAB — COMPREHENSIVE METABOLIC PANEL
ALBUMIN: 3 g/dL — AB (ref 3.5–5.0)
ALT: 17 U/L (ref 0–44)
ALT: 18 U/L (ref 0–44)
AST: 39 U/L (ref 15–41)
AST: 42 U/L — ABNORMAL HIGH (ref 15–41)
Albumin: 3.1 g/dL — ABNORMAL LOW (ref 3.5–5.0)
Alkaline Phosphatase: 199 U/L — ABNORMAL HIGH (ref 38–126)
Alkaline Phosphatase: 192 U/L — ABNORMAL HIGH (ref 38–126)
Anion gap: 13 (ref 5–15)
Anion gap: 9 (ref 5–15)
BUN: 29 mg/dL — ABNORMAL HIGH (ref 8–23)
BUN: 28 mg/dL — ABNORMAL HIGH (ref 8–23)
CHLORIDE: 101 mmol/L (ref 98–111)
CO2: 23 mmol/L (ref 22–32)
CO2: 25 mmol/L (ref 22–32)
Calcium: 7.6 mg/dL — ABNORMAL LOW (ref 8.9–10.3)
Calcium: 7.7 mg/dL — ABNORMAL LOW (ref 8.9–10.3)
Chloride: 103 mmol/L (ref 98–111)
Creatinine, Ser: 1.08 mg/dL (ref 0.61–1.24)
Creatinine, Ser: 0.99 mg/dL (ref 0.61–1.24)
GFR calc Af Amer: >60 mL/min (ref >60)
GFR calc Af Amer: >60 mL/min (ref >60)
GFR calc non Af Amer: >60 mL/min (ref >60)
GFR calc non Af Amer: >60 mL/min (ref >60)
GLUCOSE: 179 mg/dL — AB (ref 70–99)
Glucose, Bld: 191 mg/dL — ABNORMAL HIGH (ref 70–99)
Potassium: 3.5 mmol/L (ref 3.5–5.1)
Potassium: 3.7 mmol/L (ref 3.5–5.1)
Sodium: 137 mmol/L (ref 135–145)
Sodium: 137 mmol/L (ref 135–145)
Total Bilirubin: 0.7 mg/dL (ref 0.3–1.2)
Total Bilirubin: 0.5 mg/dL (ref 0.3–1.2)
Total Protein: 7 g/dL (ref 6.5–8.1)
Total Protein: 6.5 g/dL (ref 6.5–8.1)

## 2018-12-28 LAB — LIPASE, BLOOD: Lipase: 26 U/L (ref 11–51)

## 2018-12-28 LAB — URINALYSIS, ROUTINE W REFLEX MICROSCOPIC
Bilirubin Urine: NEGATIVE
GLUCOSE, UA: NEGATIVE mg/dL
Hgb urine dipstick: NEGATIVE
Ketones, ur: NEGATIVE mg/dL
Leukocytes, UA: NEGATIVE
Nitrite: NEGATIVE
Protein, ur: NEGATIVE mg/dL
Specific Gravity, Urine: 1.016 (ref 1.005–1.030)
pH: 7 (ref 5.0–8.0)

## 2018-12-28 LAB — TROPONIN I: Troponin I: <0.03 ng/mL (ref <0.03)

## 2018-12-28 LAB — PSA: Prostatic Specific Antigen: 153 ng/mL — ABNORMAL HIGH (ref 0.00–4.00)

## 2018-12-28 LAB — PREPARE RBC (CROSSMATCH)

## 2018-12-28 MED ORDER — SODIUM CHLORIDE 0.9 % IV SOLN: 10.0000 mL/h | Freq: Once | INTRAVENOUS | Status: DC

## 2018-12-28 MED ORDER — CALCIUM CARBONATE-VITAMIN D 500-200 MG-UNIT PO TABS
1.0000 | ORAL_TABLET | Freq: Every day | ORAL | Status: DC
  Administered 2018-12-28 – 2018-12-29 (×2): 1 via ORAL
  Filled 2018-12-28 (×2): qty 1

## 2018-12-28 MED ORDER — ONDANSETRON HCL 4 MG/2ML IJ SOLN: 4.0000 mg | Freq: Four times a day (QID) | INTRAMUSCULAR | Status: DC | PRN

## 2018-12-28 MED ORDER — HYDROCODONE-ACETAMINOPHEN 10-325 MG PO TABS: 1.0000 | ORAL_TABLET | Freq: Four times a day (QID) | ORAL | Status: DC | PRN

## 2018-12-28 MED ORDER — FERROUS SULFATE 325 (65 FE) MG PO TABS
325.0000 mg | ORAL_TABLET | Freq: Every day | ORAL | Status: DC
  Administered 2018-12-29: 325 mg via ORAL
  Filled 2018-12-28: qty 1

## 2018-12-28 MED ORDER — BOOST / RESOURCE BREEZE PO LIQD CUSTOM
1.0000 | Freq: Three times a day (TID) | ORAL | Status: DC
  Administered 2018-12-29: 1 via ORAL

## 2018-12-28 MED ORDER — TRAMADOL HCL 50 MG PO TABS
100.0000 mg | ORAL_TABLET | ORAL | Status: DC | PRN
  Administered 2018-12-28 – 2018-12-29 (×2): 100 mg via ORAL
  Filled 2018-12-28 (×2): qty 2

## 2018-12-28 MED ORDER — PANTOPRAZOLE SODIUM 40 MG IV SOLR
40.0000 mg | Freq: Two times a day (BID) | INTRAVENOUS | Status: DC
  Administered 2018-12-28 – 2018-12-29 (×2): 40 mg via INTRAVENOUS
  Filled 2018-12-28 (×2): qty 40

## 2018-12-28 MED ORDER — VITAMIN C 500 MG PO TABS
500.0000 mg | ORAL_TABLET | Freq: Every day | ORAL | Status: DC
  Administered 2018-12-28 – 2018-12-29 (×2): 500 mg via ORAL
  Filled 2018-12-28 (×2): qty 1

## 2018-12-28 MED ORDER — VITAMIN B-12 1000 MCG PO TABS
500.0000 ug | ORAL_TABLET | Freq: Every day | ORAL | Status: DC
  Administered 2018-12-29: 500 ug via ORAL
  Filled 2018-12-28: qty 1

## 2018-12-28 MED ORDER — ONDANSETRON HCL 4 MG PO TABS: 4.0000 mg | ORAL_TABLET | Freq: Four times a day (QID) | ORAL | Status: DC | PRN

## 2018-12-28 MED ORDER — ACETAMINOPHEN 650 MG RE SUPP: 650.0000 mg | Freq: Four times a day (QID) | RECTAL | Status: DC | PRN

## 2018-12-28 MED ORDER — SODIUM CHLORIDE 0.9% IV SOLUTION: Freq: Once | INTRAVENOUS | Status: DC

## 2018-12-28 MED ORDER — ACETAMINOPHEN 325 MG PO TABS: 650.0000 mg | ORAL_TABLET | Freq: Four times a day (QID) | ORAL | Status: DC | PRN

## 2018-12-28 MED ORDER — HYDROCODONE-ACETAMINOPHEN 10-325 MG PO TABS
1.0000 | ORAL_TABLET | Freq: Four times a day (QID) | ORAL | Status: DC | PRN
  Administered 2018-12-28: 1 via ORAL
  Filled 2018-12-28: qty 1

## 2018-12-28 NOTE — ED Triage Notes (Signed)
Sent from cancer center for hgb of 5.2.  Pt reports black stools x 1 month that he thought was d/t taking iron pills.

## 2018-12-28 NOTE — ED Notes (Signed)
Dr. Carles Collet stated that pt could have a regular diet. Notified pt and family. Gave pt Sprite.

## 2018-12-28 NOTE — ED Provider Notes (Signed)
[facility] EMERGENCY DEPARTMENT Provider Note   CSN: [id number] Arrival date & time: 12/28/18  1211     History   Chief Complaint Chief Complaint  Patient presents with  . GI Bleeding    HPI Walter Vavra. is a 68 y.o. male.  HPI  Pt was seen at 1230. Per pt, c/o gradual onset and worsening of persistent generalized weakness/fatigue for the past 1 month. Pt states he has been having "black stools" which he believes is due to his iron pills. Pt was evaluated by his Onc MD, had labs drawn, then told to come to the ED for further evaluation/admission for "Hgb of 5." Pt denies any other complaints other than his "bone pain" from CA. Denies vomiting/diarrhea, no abd pain, no CP/SOB, no cough, no fevers, no rash, no blood in stools.     GI: Rehman Past Medical History:  Diagnosis Date  . Acid reflux   . Alcoholism ([place])   . Arthritis   . Bony metastasis ([place])   . Chronic back pain   . Diverticulosis   . Fatty liver   . Gynecomastia, male 01/11/2013   Secondary to prostate ca Tx.   . H/O Bell's palsy   . Hemorrhoids    history  . High cholesterol   . History of back injury 11/19/2013  . Hyperglycemia   . Hyperlipidemia   . Hypertension   . Prostate cancer ([place])   . Prostate cancer ([place]) 06/20/2011  . Prostate carcinoma, recurrent ([place]) 08/10/2012    Patient Active Problem List   Diagnosis Date Noted  . Obesity (BMI 30.0-34.9) 05/30/2018  . Chronic right-sided low back pain with right-sided sciatica 07/06/2017  . GERD (gastroesophageal reflux disease) 11/22/2016  . Knee pain, left 02/11/2016  . Prediabetes 01/27/2016  . Osteopenia determined by x-ray 02/01/2015  . Hypertension   . Hyperlipidemia   . Gynecomastia, male 01/11/2013  . Primary prostate cancer with metastasis from prostate to other site [facility]) 06/20/2011  . H N P-CERVICAL 10/05/2009  . CERVICAL SPASM 10/05/2009    Past Surgical History:  Procedure Laterality Date  . [place]   left    . COLONOSCOPY  2008  . CYSTOSCOPY WITH INSERTION OF UROLIFT N/A 10/16/2018   Procedure: CYSTOSCOPY WITH INSERTION OF UROLIFT;  Surgeon: Franchot Gallo, MD;  Location: [facility];  Service: Urology;  Laterality: N/A;  . ESOPHAGOGASTRODUODENOSCOPY    . HAND SURGERY     nerve was torn - left hand  . HERNIA REPAIR  2010  . I&D EXTREMITY Left 06/10/2015   Procedure: IRRIGATION AND DEBRIDEMENT EXTREMITY LEFT HAND EXPLORATION, nerve repair;  Surgeon: Charlotte Crumb, MD;  Location: [facility];  Service: Orthopedics;  Laterality: Left;  . KNEE SURGERY     left  . PROSTATE BIOPSY  11/06        Home Medications    Prior to Admission medications   Medication Sig Start Date End Date Taking? Authorizing Provider  amLODipine (NORVASC) 10 MG tablet Take 1 tablet (10 mg total) by mouth daily. 04/25/18   Dettinger, Fransisca Kaufmann, MD  Calcium-Magnesium-Vitamin D (CALCIUM 1200+D3 PO) Take 3 tablets by mouth daily.     [provider]  diclofenac sodium (VOLTAREN) 1 % GEL Apply 2 g topically 4 (four) times daily. Patient taking differently: Apply 2 g topically 4 (four) times daily as needed (for pain).  07/13/18   Dettinger, Fransisca Kaufmann, MD  ferrous sulfate (FERROUSUL) 325 (65 FE) MG tablet Take 1 tablet (325  mg total) by mouth daily with breakfast. 12/10/18   Dettinger, Fransisca Kaufmann, MD  HYDROcodone-acetaminophen (NORCO) 10-325 MG tablet Take 1 tablet by mouth every 8 (eight) hours as needed. Patient taking differently: Take 1 tablet by mouth every 4 (four) hours as needed.  12/10/18   Dettinger, Fransisca Kaufmann, MD  Leuprolide Acetate ([name] IJ) Inject as directed every 3 (three) months.    [provider]  Menthol-Methyl Salicylate (MUSCLE RUB) 10-15 % CREA Apply 1 application topically daily as needed for muscle pain.     [provider]  Nutritional Supplements (EQUATE PLUS) LIQD Take 1 Can by mouth 2 (two) times daily. 12/10/18   Dettinger, Fransisca Kaufmann, MD  promethazine (PHENERGAN) 25 MG tablet Take  1 tablet (25 mg total) by mouth every 6 (six) hours as needed for nausea or vomiting. 11/28/18   Lockamy, Randi L, NP-C  traMADol (ULTRAM) 50 MG tablet Take 2 tablets (100 mg total) by mouth every 4 (four) hours as needed. Patient taking differently: Take 100 mg by mouth every 4 (four) hours as needed for moderate pain.  09/06/18   Dettinger, Fransisca Kaufmann, MD  vitamin B-12 (CYANOCOBALAMIN) 500 MCG tablet Take 500 mcg by mouth daily.    [provider]  vitamin C (ASCORBIC ACID) 500 MG tablet Take 1 tablet (500 mg total) by mouth daily. Take with iron for better absorption 12/10/18   Dettinger, Fransisca Kaufmann, MD  XTANDI 40 MG capsule TAKE 4 CAPSULES BY MOUTH DAILY Patient not taking: Reported on 12/28/2018 12/11/18   Glennie [place], NP-C    Family History Family History  Problem Relation Age of Onset  . Heart failure Mother   . Diabetes Father   . Cancer Sister   . Diabetes Brother     Social History Social History   Tobacco Use  . Smoking status: Former Smoker    Packs/day: 2.50    Years: 3.00    Pack years: 7.50  . Smokeless tobacco: Never Used  Substance Use Topics  . Alcohol use: No    Comment: former drinker 20 years ago  . Drug use: No     Allergies   Patient has no known allergies.   Review of Systems Review of Systems ROS: Statement: All systems negative except as marked or noted in the HPI; Constitutional: Negative for fever and chills. +generalized weakness/fatigue. ; ; Eyes: Negative for eye pain, redness and discharge. ; ; ENMT: Negative for ear pain, hoarseness, nasal congestion, sinus pressure and sore throat. ; ; Cardiovascular: Negative for chest pain, palpitations, diaphoresis, dyspnea and peripheral edema. ; ; Respiratory: Negative for cough, wheezing and stridor. ; ; Gastrointestinal: +"black stools." Negative for nausea, vomiting, diarrhea, abdominal pain, blood in stool, hematemesis, jaundice and rectal bleeding. . ; ; Genitourinary: Negative for dysuria,  flank pain and hematuria. ; ; Musculoskeletal: Negative for back pain and neck pain. Negative for swelling and trauma.; ; Skin: Negative for pruritus, rash, abrasions, blisters, bruising and skin lesion.; ; Neuro: Negative for headache, lightheadedness and neck stiffness. Negative for altered level of consciousness, altered mental status, extremity weakness, paresthesias, involuntary movement, seizure and syncope.       Physical Exam Updated Vital Signs BP 124/68   Pulse 94   Temp 98.3 F (36.8 C) (Oral)   Resp (!) 21   Ht 5\' 6"  (1.676 m)   Wt 79 kg   SpO2 97%   BMI 28.11 kg/m    Patient Vitals for the past 24 hrs:  BP  Temp Temp src Pulse Resp SpO2 Height Weight  12/28/18 1330 124/68 - - 94 (!) 21 97 % - -  12/28/18 1315 - - - 95 (!) 23 93 % - -  12/28/18 1300 118/68 - - 94 (!) 23 98 % - -  12/28/18 1245 - - - 100 20 95 % - -  12/28/18 1230 112/63 - - 96 (!) 23 94 % - -  12/28/18 1215 124/63 98.3 F (36.8 C) Oral 94 18 100 % - -  12/28/18 1213 - - - - - - 5\' 6"  (1.676 m) 79 kg   13:59 Orthostatic Vital Signs LA  Orthostatic Lying   BP- Lying: 124/68  Pulse- Lying: 69      Orthostatic Sitting  BP- Sitting: 116/67  Pulse- Sitting: 86      Orthostatic Standing at 0 minutes  BP- Standing at 0 minutes: 131/70 (pt states he is very weak)  Pulse- Standing at 0 minutes: 103    Physical Exam 1235: Physical examination:  Nursing notes reviewed; Vital signs and O2 SAT reviewed;  Constitutional: Well developed, Well nourished, Well hydrated, In no acute distress; Head:  Normocephalic, atraumatic; Eyes: EOMI, PERRL, No scleral icterus. Conjunctiva pale.; ENMT: Mouth and pharynx normal, Mucous membranes moist; Neck: Supple, Full range of motion, No lymphadenopathy; Cardiovascular: Regular rate and rhythm, No gallop; Respiratory: Breath sounds clear & equal bilaterally, No wheezes.  Speaking full sentences with ease, Normal respiratory effort/excursion; Chest: Nontender, Movement  normal; Abdomen: Soft, Nontender, Nondistended, Normal bowel sounds. Rectal exam performed w/permission of pt and ED RN chaperone present.  Anal tone normal.  Non-tender, soft brown stool in rectal vault, heme neg.  No fissures, no external hemorrhoids, no palp masses.;; Genitourinary: No CVA tenderness; Extremities: Peripheral pulses normal, No tenderness, No edema, No calf edema or asymmetry.; Neuro: AA&Ox3, Major CN grossly intact.  Speech clear. No gross focal motor or sensory deficits in extremities.; Skin: Color pale, Warm, Dry.   ED Treatments / Results  Labs (all labs ordered are listed, but only abnormal results are displayed)   EKG EKG Interpretation  Date/Time:  Friday December 28 2018 12:18:52 EST Ventricular Rate:  95 PR Interval:    QRS Duration: 95 QT Interval:  386 QTC Calculation: 486 R Axis:   18 Text Interpretation:  Sinus rhythm Inferior infarct, old When compared with ECG of 07/29/2018 No significant change was found Confirmed by Francine Graven [id number]) on 12/28/2018 1:40:53 PM   Radiology   Procedures Procedures (including critical care time)  Medications Ordered in ED Medications  0.9 %  sodium chloride infusion (has no administration in time range)     Initial Impression / Assessment and Plan / ED Course  I have reviewed the triage vital signs and the nursing notes.  Pertinent labs & imaging results that were available during my care of the patient were reviewed by me and considered in my medical decision making (see chart for details).  MDM Reviewed: previous chart, nursing note and vitals Reviewed previous: labs and ECG Interpretation: labs, ECG and x-ray Total time providing critical care: 30-74 minutes. This excludes time spent performing separately reportable procedures and services. Consults: admitting MD   CRITICAL CARE Performed by: Francine Graven Total critical care time: 35 minutes Critical care time was exclusive of separately  billable procedures and treating other patients. Critical care was necessary to treat or prevent imminent or life-threatening deterioration. Critical care was time spent personally by me on the following activities: development of treatment plan  with patient and/or surrogate as well as nursing, discussions with consultants, evaluation of patient's response to treatment, examination of patient, obtaining history from patient or surrogate, ordering and performing treatments and interventions, ordering and review of laboratory studies, ordering and review of radiographic studies, pulse oximetry and re-evaluation of patient's condition.   Results for orders placed or performed during the hospital encounter of 12/28/18  Urinalysis, Routine w reflex microscopic  Result Value Ref Range   Color, Urine YELLOW YELLOW   APPearance CLEAR CLEAR   Specific Gravity, Urine 1.016 1.005 - 1.030   pH 7.0 5.0 - 8.0   Glucose, UA NEGATIVE NEGATIVE mg/dL   Hgb urine dipstick NEGATIVE NEGATIVE   Bilirubin Urine NEGATIVE NEGATIVE   Ketones, ur NEGATIVE NEGATIVE mg/dL   Protein, ur NEGATIVE NEGATIVE mg/dL   Nitrite NEGATIVE NEGATIVE   Leukocytes, UA NEGATIVE NEGATIVE  CBC with Differential  Result Value Ref Range   WBC 4.7 4.0 - 10.5 K/uL   RBC 1.69 (L) 4.22 - 5.81 MIL/uL   Hemoglobin 4.8 (LL) 13.0 - 17.0 g/dL   HCT 16.1 (L) 39.0 - 52.0 %   MCV 95.3 80.0 - 100.0 fL   MCH 28.4 26.0 - 34.0 pg   MCHC 29.8 (L) 30.0 - 36.0 g/dL   RDW 20.4 (H) 11.5 - 15.5 %   Platelets 152 150 - 400 K/uL   nRBC 2.4 (H) 0.0 - 0.2 %   Neutrophils Relative % 56 %   Neutro Abs 2.6 1.7 - 7.7 K/uL   Lymphocytes Relative 19 %   Lymphs Abs 0.9 0.7 - 4.0 K/uL   Monocytes Relative 15 %   Monocytes Absolute 0.7 0.1 - 1.0 K/uL   Eosinophils Relative 2 %   Eosinophils Absolute 0.1 0.0 - 0.5 K/uL   Basophils Relative 0 %   Basophils Absolute 0.0 0.0 - 0.1 K/uL   WBC Morphology MILD LEFT SHIFT (1-5% METAS, OCC MYELO, OCC BANDS)    RBC  Morphology FEW NRBCS SEEN    Immature Granulocytes 8 %   Abs Immature Granulocytes 0.39 (H) 0.00 - 0.07 K/uL   Polychromasia PRESENT   Troponin I - Once  Result Value Ref Range   Troponin I <0.03 <0.03 ng/mL  Comprehensive metabolic panel  Result Value Ref Range   Sodium 137 135 - 145 mmol/L   Potassium 3.7 3.5 - 5.1 mmol/L   Chloride 103 98 - 111 mmol/L   CO2 25 22 - 32 mmol/L   Glucose, Bld 179 (H) 70 - 99 mg/dL   BUN 28 (H) 8 - 23 mg/dL   Creatinine, Ser 0.99 0.61 - 1.24 mg/dL   Calcium 7.7 (L) 8.9 - 10.3 mg/dL   Total Protein 6.5 6.5 - 8.1 g/dL   Albumin 3.0 (L) 3.5 - 5.0 g/dL   AST 42 (H) 15 - 41 U/L   ALT 18 0 - 44 U/L   Alkaline Phosphatase 192 (H) 38 - 126 U/L   Total Bilirubin 0.5 0.3 - 1.2 mg/dL   GFR calc non Af Amer >60 >60 mL/min   GFR calc Af Amer >60 >60 mL/min   Anion gap 9 5 - 15  Lipase, blood  Result Value Ref Range   Lipase 26 11 - 51 U/L  Type and screen [facility]  Result Value Ref Range   ABO/RH(D) A NEG    Antibody Screen NEG    Sample Expiration 12/31/2018    Unit Number [id number]    Blood Component Type RED CELLS,LR  Unit division 00    Status of Unit ALLOCATED    Transfusion Status OK TO TRANSFUSE    Crossmatch Result Compatible    Unit Number [id number]    Blood Component Type RED CELLS,LR    Unit division 00    Status of Unit ALLOCATED    Transfusion Status OK TO TRANSFUSE    Crossmatch Result      Compatible Performed at [facility], [address], [place], [place] [address]   BPAM [facility]  Result Value Ref Range   Blood Product Unit Number [id number]    Unit Type and Rh 0600    Blood Product Expiration Date [id number]    Blood Product Unit Number [id number]    Unit Type and Rh 0600    Blood Product Expiration Date [id number]    Dg Chest 2 View Result Date: 12/28/2018 CLINICAL DATA:  68 y/o M; nausea and weakness. History of prostate cancer. EXAM: CHEST - 2 VIEW COMPARISON:  04/27/2018 bone scan.  04/26/2018 CT chest. 01/28/2018 chest radiograph. FINDINGS: Stable normal cardiac silhouette. Clear lungs. No pleural effusion or pneumothorax. No acute osseous abnormality is evident. Diffuse sclerotic bony metastasis. IMPRESSION: No acute pulmonary process identified. Electronically Signed   By: Kristine Garbe M.D.   On: 12/28/2018 13:37   Results for HIEP, OLLIS (MRN [id number]) as of 12/28/2018 14:28  Ref. Range 11/13/2018 12:42 11/28/2018 08:40 12/10/2018 14:22 12/28/2018 10:45 12/28/2018 13:01  Hemoglobin Latest Ref Range: 13.0 - 17.0 g/dL 7.3 (L) 6.8 (LL) 8.0 (LL) 5.2 (LL) 4.8 (LL)  HCT Latest Ref Range: 39.0 - 52.0 % 24.1 (L) 22.5 (L) 23.6 (L) 17.8 (L) 16.1 (L)  Platelets Latest Ref Range: 150 - 400 K/uL 93 (L) 156 129 (L) 159 152    1430:  Pt too generally weak to stand for orthostatic VS.  H/H lower than previous. PRBC's transfusion ordered. Stool is heme negative. Dx and testing d/w pt.  Questions answered.  Verb understanding, agreeable to admit. T/C returned from [facility] Carles Collet, case discussed, including:  HPI, pertinent PM/SHx, VS/PE, dx testing, ED course and treatment:  Agreeable to admit.    Final Clinical Impressions(s) / ED Diagnoses   Final diagnoses:  Symptomatic anemia  Prostate cancer metastatic to bone [facility])    ED Discharge Orders    None       Francine Graven, DO 12/29/18 [phone]

## 2018-12-28 NOTE — Progress Notes (Signed)
Second unit of PRBC infusing now with no adverse reaction

## 2018-12-28 NOTE — ED Notes (Signed)
Called blood bank. Sherri stated blood is now ready for pickup for pt.

## 2018-12-28 NOTE — H&P (Signed)
History and Physical  Walter Boone. GPQ:[id number] DOB: October 02, 1951 DOA: 12/28/2018   PCP: Dettinger, Fransisca Kaufmann, MD   Patient coming from: Home  Chief Complaint: Fatigue, dizziness, dyspnea on exertion  HPI:  Walter Boone. is a 68 y.o. male with medical history of metastatic prostate cancer to bone, hypertension, neoplasm related pain presented to [facility] today for routine visit.  Blood work revealed that he had hemoglobin of 5.2.  As result, the patient was sent to the emergency department for further evaluation.  The patient states that he has been having nausea, decreased oral intake, and dark stools for approximately 3 to 4 weeks.  He states that he was started on iron approximately 3 weeks ago.  He is been complaining some dyspnea on exertion.  He has been having chronic chest pain at the center of his chest along with back pain early 6 months for which she has been taking hydrocodone and tramadol.  He denies any hematochezia, hematemesis, hematuria.  He has been feeling more fatigued.  He states that certain movements and palpation has made his chest pain worse. In the emergency department, the patient was afebrile hemodynamically stable saturating 100% room air.  BMP and LFTs were essentially unremarkable.  Hemoglobin was noted to be 4.8.  Initial troponin was negative.  EKG showed sinus rhythm with nonspecific T wave changes.  Urinalysis was negative for hematuria or pyuria.  Patient was noted to have 2 units PRBC.  Hemoccult was negative.  Assessment/Plan: Symptomatic anemia -Hemoccult was negative in the emergency department -10/11/2018 bone marrow biopsy--complete infiltration by prostate cancer cells -Patient had received 2 units PRBC 11/28/2018 -Baseline hemoglobin 7-8 -Transfused 3 units PRBC -Case was discussed with GI, Dr. Vicenta Aly hemoglobins for now; may need endoscopy if continues to be symptomatic without improvement -12/10/2018 iron  saturation 57%, ferritin 1798 -B12 greater than 0940, folic acid 6.6 -Start PPI  Metastatic prostate cancer to the bone -pt followed by Dr. Delton Coombes -currently on Xtandi and [name]  Essential hypertension -Holding amlodipine secondary to soft blood pressure  Neoplasm related pain -Continue tramadol and hydrocodone  Thrombocytopenia -Likely related to pulmonary infiltration from his prostate cancer -Monitor for signs of bleeding -Serial CBCs  Atypical chest pain -Cycle troponins -Reproducible by palpation -EKG--sinus rhythm with nonspecific T wave changes -Check d-dimer      Past Medical History:  Diagnosis Date  . Acid reflux   . Alcoholism ([place])   . Arthritis   . Bony metastasis ([place])   . Chronic back pain   . Diverticulosis   . Fatty liver   . Gynecomastia, male 01/11/2013   Secondary to prostate ca Tx.   . H/O Bell's palsy   . Hemorrhoids    history  . High cholesterol   . History of back injury 11/19/2013  . Hyperglycemia   . Hyperlipidemia   . Hypertension   . Prostate cancer ([place])   . Prostate cancer ([place]) 06/20/2011  . Prostate carcinoma, recurrent ([place]) 08/10/2012   Past Surgical History:  Procedure Laterality Date  . [place]   left  . COLONOSCOPY  2008  . CYSTOSCOPY WITH INSERTION OF UROLIFT N/A 10/16/2018   Procedure: CYSTOSCOPY WITH INSERTION OF UROLIFT;  Surgeon: Franchot Gallo, MD;  Location: [facility];  Service: Urology;  Laterality: N/A;  . ESOPHAGOGASTRODUODENOSCOPY    . HAND SURGERY     nerve was torn - left hand  . HERNIA REPAIR  2010  .  I&D EXTREMITY Left 06/10/2015   Procedure: IRRIGATION AND DEBRIDEMENT EXTREMITY LEFT HAND EXPLORATION, nerve repair;  Surgeon: Charlotte Crumb, MD;  Location: [facility];  Service: Orthopedics;  Laterality: Left;  . KNEE SURGERY     left  . PROSTATE BIOPSY  11/06   Social History:  reports that he has quit smoking. He has a 7.50 pack-year smoking history. He has never used smokeless tobacco.  He reports that he does not drink alcohol or use drugs.   Family History  Problem Relation Age of Onset  . Heart failure Mother   . Diabetes Father   . Cancer Sister   . Diabetes Brother      No Known Allergies   Prior to Admission medications   Medication Sig Start Date End Date Taking? Authorizing Provider  amLODipine (NORVASC) 10 MG tablet Take 1 tablet (10 mg total) by mouth daily. 04/25/18  Yes Dettinger, Fransisca Kaufmann, MD  Calcium-Magnesium-Vitamin D (CALCIUM 1200+D3 PO) Take 1 tablet by mouth daily.    Yes [provider]  Cholecalciferol 50 MCG (2000 UT) CHEW Chew 1 tablet by mouth.   Yes [provider]  diclofenac sodium (VOLTAREN) 1 % GEL Apply 2 g topically 4 (four) times daily. Patient taking differently: Apply 2 g topically 4 (four) times daily as needed (for pain).  07/13/18  Yes Dettinger, Fransisca Kaufmann, MD  ferrous sulfate (FERROUSUL) 325 (65 FE) MG tablet Take 1 tablet (325 mg total) by mouth daily with breakfast. 12/10/18  Yes Dettinger, Fransisca Kaufmann, MD  HYDROcodone-acetaminophen (NORCO) 10-325 MG tablet Take 1 tablet by mouth every 8 (eight) hours as needed. Patient taking differently: Take 1 tablet by mouth every 8 (eight) hours as needed for severe pain.  12/10/18  Yes Dettinger, Fransisca Kaufmann, MD  Leuprolide Acetate ([name] IJ) Inject 1 application as directed every 6 (six) months.    Yes [provider]  Menthol-Methyl Salicylate (MUSCLE RUB) 10-15 % CREA Apply 1 application topically daily as needed for muscle pain.    Yes [provider]  Nutritional Supplements (EQUATE PLUS) LIQD Take 1 Can by mouth 2 (two) times daily. 12/10/18  Yes Dettinger, Fransisca Kaufmann, MD  OVER THE COUNTER MEDICATION See admin instructions. preggie pop drops - take 1 lozenge as needed.    Yes [provider]  promethazine (PHENERGAN) 25 MG tablet Take 1 tablet (25 mg total) by mouth every 6 (six) hours as needed for nausea or vomiting. 11/28/18  Yes Lockamy, Randi L,  NP-C  traMADol (ULTRAM) 50 MG tablet Take 2 tablets (100 mg total) by mouth every 4 (four) hours as needed. Patient taking differently: Take 100 mg by mouth every 4 (four) hours as needed for moderate pain.  09/06/18  Yes Dettinger, Fransisca Kaufmann, MD  vitamin B-12 (CYANOCOBALAMIN) 500 MCG tablet Take 500 mcg by mouth daily.   Yes [provider]  vitamin C (ASCORBIC ACID) 500 MG tablet Take 1 tablet (500 mg total) by mouth daily. Take with iron for better absorption 12/10/18  Yes Dettinger, Fransisca Kaufmann, MD  XTANDI 40 MG capsule TAKE 4 CAPSULES BY MOUTH DAILY Patient taking differently: Take 160 mg by mouth daily.  12/11/18  Yes Lockamy, Randi L, NP-C    Review of Systems:  Constitutional:  No weight loss, night sweats, Fevers, chills, fatigue.  Head&Eyes: No headache.  No vision loss.  No eye pain or scotoma ENT:  No Difficulty swallowing,Tooth/dental problems,Sore throat,  No ear ache, post nasal drip,  Cardio-vascular:  No chest pain,  Orthopnea, PND, swelling in lower extremities,  dizziness, palpitations  GI:  No  abdominal pain,  vomiting, diarrhea, loss of appetite, hematochezia, melena, heartburn, indigestion, Resp:  No shortness of breath with exertion or at rest. No cough. No coughing up of blood .No wheezing.No chest wall deformity  Skin:  no rash or lesions.  GU:  no dysuria, change in color of urine, no urgency or frequency. No flank pain.  Musculoskeletal:  No joint pain or swelling. No decreased range of motion. No back pain.  Psych:  No change in mood or affect. No depression or anxiety. Neurologic: No headache, no dysesthesia, no focal weakness, no vision loss. No syncope  Physical Exam: Vitals:   12/28/18 1400 12/28/18 1430 12/28/18 1451 12/28/18 1452  BP: 139/74 139/74 131/71   Pulse: (!) 101 (!) 101 (!) 103   Resp: 18 18 18    Temp: 99.4 F (37.4 C) 99.4 F (37.4 C) 99.4 F (37.4 C) 99.4 F (37.4 C)  TempSrc: Oral Oral Oral Oral  SpO2: 100% 100% 99%     Weight:      Height:       General:  A&O x 3, NAD, nontoxic, pleasant/cooperative Head/Eye: No conjunctival hemorrhage, no icterus, [place]/AT, No nystagmus ENT:  No icterus,  No thrush, good dentition, no pharyngeal exudate Neck:  No masses, no lymphadenpathy, no bruits CV:  RRR, no rub, no gallop, no S3 Lung:  CTAB, good air movement, no wheeze, no rhonchi Abdomen: soft/NT, +BS, nondistended, no peritoneal signs Ext: No cyanosis, No rashes, No petechiae, No lymphangitis, No edema Neuro: CNII-XII intact, strength 4/5 in bilateral upper and lower extremities, no dysmetria  Labs on Admission:  Basic Metabolic Panel: Recent Labs  Lab 12/28/18 1045 12/28/18 1301  NA 137 137  K 3.5 3.7  CL 101 103  CO2 23 25  GLUCOSE 191* 179*  BUN 29* 28*  CREATININE 1.08 0.99  CALCIUM 7.6* 7.7*   Liver Function Tests: Recent Labs  Lab 12/28/18 1045 12/28/18 1301  AST 39 42*  ALT 17 18  ALKPHOS 199* 192*  BILITOT 0.7 0.5  PROT 7.0 6.5  ALBUMIN 3.1* 3.0*   Recent Labs  Lab 12/28/18 1301  LIPASE 26   No results for input(s): AMMONIA in the last 168 hours. CBC: Recent Labs  Lab 12/28/18 1045 12/28/18 1301  WBC 4.9 4.7  NEUTROABS 3.0 2.6  HGB 5.2* 4.8*  HCT 17.8* 16.1*  MCV 95.7 95.3  PLT 159 152   Coagulation Profile: No results for input(s): INR, PROTIME in the last 168 hours. Cardiac Enzymes: Recent Labs  Lab 12/28/18 1301  TROPONINI <0.03   BNP: Invalid input(s): POCBNP CBG: No results for input(s): GLUCAP in the last 168 hours. Urine analysis:    Component Value Date/Time   COLORURINE YELLOW 12/28/2018 [place] 12/28/2018 1237   LABSPEC 1.016 12/28/2018 1237   PHURINE 7.0 12/28/2018 [place] 12/28/2018 1237   HGBUR NEGATIVE 12/28/2018 [place] 12/28/2018 1237   KETONESUR NEGATIVE 12/28/2018 1237   PROTEINUR NEGATIVE 12/28/2018 1237   UROBILINOGEN 0.2 03/12/2014 1025   NITRITE NEGATIVE 12/28/2018 1237    LEUKOCYTESUR NEGATIVE 12/28/2018 1237   Sepsis Labs: @LABRCNTIP (procalcitonin:4,lacticidven:4) )No results found for this or any previous visit (from the past 240 hour(s)).   Radiological Exams on Admission: Dg Chest 2 View  Result Date: 12/28/2018 CLINICAL DATA:  67 y/o M; nausea and weakness. History of prostate cancer. EXAM: CHEST - 2 VIEW COMPARISON:  04/27/2018 bone scan. 04/26/2018 CT chest. 01/28/2018 chest radiograph. FINDINGS: Stable normal cardiac silhouette. Clear lungs. No pleural effusion or pneumothorax. No acute osseous abnormality is evident. Diffuse sclerotic bony metastasis. IMPRESSION: No acute pulmonary process identified. Electronically Signed   By: Kristine Garbe M.D.   On: 12/28/2018 13:37    EKG: Independently reviewed. pending    Time spent:60 minutes Code Status:   FULL Family Communication:  No Family at bedside Disposition Plan: expect 1-2 day hospitalization Consults called: GI--Rehman DVT Prophylaxis: SCDs  Orson Eva, DO  [facility] Pager [phone]  If 7PM-7AM, please contact night-coverage [email or web address] Password [email or web address] 12/28/2018, 3:10 PM

## 2018-12-28 NOTE — Progress Notes (Signed)
Nutrition  Planning to meet with patient today following MD appointment but patient sent to ED after labs.  Will follow-up with patient as able.  Jane Broughton B. Zenia Resides, [place], [place] Registered Dietitian [phone] (pager)

## 2018-12-28 NOTE — ED Notes (Signed)
Patient reports tarry black stool the day before yesterday.

## 2018-12-28 NOTE — ED Notes (Signed)
Date and time results received: 12/28/18 1:15 PM  Test: hemoglobin Critical Value: 4.8  Name of Provider Notified: [facility]man[facility]  Orders Received? Or Actions Taken?: None at this time, will continue to monitor.

## 2018-12-28 NOTE — Progress Notes (Signed)
CRITICAL VALUE ALERT Critical value received:  5.2 hgb Date of notification:  12-28-2017 Time of notification: 1110 Critical value read back:  Yes.   Nurse who received alert:  C.Larry Alcock RN MD notified (1st Dorita Rowlands):  Dr. Delton Coombes

## 2018-12-28 NOTE — Progress Notes (Signed)
Per Dr.Katragadda patient is to go to the ER for symptomatic anemia. Hgb is 5.2 today. Patient is fatigued, pale and is c/o of being very cold. Patient c/o of tingling to left arm but states he has had that feeling x3weeks where it feels like "it goes to sleep".  Walter Boone in ER made aware, pt to ED via wheel chair by [name], NT.

## 2018-12-29 ENCOUNTER — Observation Stay (HOSPITAL_BASED_OUTPATIENT_CLINIC_OR_DEPARTMENT_OTHER): Payer: Medicare HMO

## 2018-12-29 DIAGNOSIS — I361 Nonrheumatic tricuspid (valve) insufficiency: Secondary | ICD-10-CM

## 2018-12-29 DIAGNOSIS — I370 Nonrheumatic pulmonary valve stenosis: Secondary | ICD-10-CM

## 2018-12-29 DIAGNOSIS — C61 Malignant neoplasm of prostate: Secondary | ICD-10-CM

## 2018-12-29 DIAGNOSIS — C7951 Secondary malignant neoplasm of bone: Secondary | ICD-10-CM

## 2018-12-29 LAB — URINE CULTURE: Culture: <10000 — AB

## 2018-12-29 LAB — CBC
HCT: 25.5 % — ABNORMAL LOW (ref 39.0–52.0)
HEMATOCRIT: 24.9 % — AB (ref 39.0–52.0)
Hemoglobin: 7.9 g/dL — ABNORMAL LOW (ref 13.0–17.0)
Hemoglobin: 8 g/dL — ABNORMAL LOW (ref 13.0–17.0)
MCH: 29.4 pg (ref 26.0–34.0)
MCH: 29 pg (ref 26.0–34.0)
MCHC: 31.7 g/dL (ref 30.0–36.0)
MCHC: 31.4 g/dL (ref 30.0–36.0)
MCV: 92.6 fL (ref 80.0–100.0)
MCV: 92.4 fL (ref 80.0–100.0)
Platelets: 125 10*3/uL — ABNORMAL LOW (ref 150–400)
Platelets: 120 10*3/uL — ABNORMAL LOW (ref 150–400)
RBC: 2.69 MIL/uL — ABNORMAL LOW (ref 4.22–5.81)
RBC: 2.76 MIL/uL — ABNORMAL LOW (ref 4.22–5.81)
RDW: 18.4 % — ABNORMAL HIGH (ref 11.5–15.5)
RDW: 18.6 % — ABNORMAL HIGH (ref 11.5–15.5)
WBC: 5 10*3/uL (ref 4.0–10.5)
WBC: 4.9 10*3/uL (ref 4.0–10.5)
nRBC: 1.8 % — ABNORMAL HIGH (ref 0.0–0.2)
nRBC: 1.4 % — ABNORMAL HIGH (ref 0.0–0.2)

## 2018-12-29 LAB — PREPARE RBC (CROSSMATCH)

## 2018-12-29 LAB — PROTIME-INR
INR: 1.24
Prothrombin Time: 15.5 seconds — ABNORMAL HIGH (ref 11.4–15.2)

## 2018-12-29 LAB — BASIC METABOLIC PANEL
Anion gap: 8 (ref 5–15)
BUN: 19 mg/dL (ref 8–23)
CO2: 22 mmol/L (ref 22–32)
Calcium: 7.6 mg/dL — ABNORMAL LOW (ref 8.9–10.3)
Chloride: 107 mmol/L (ref 98–111)
Creatinine, Ser: 0.81 mg/dL (ref 0.61–1.24)
GFR calc Af Amer: >60 mL/min (ref >60)
GFR calc non Af Amer: >60 mL/min (ref >60)
Glucose, Bld: 146 mg/dL — ABNORMAL HIGH (ref 70–99)
Potassium: 4.1 mmol/L (ref 3.5–5.1)
Sodium: 137 mmol/L (ref 135–145)

## 2018-12-29 LAB — ECHOCARDIOGRAM COMPLETE
Height: 66 in
Weight: 2786.61 oz

## 2018-12-29 LAB — APTT: aPTT: 35 seconds (ref 24–36)

## 2018-12-29 LAB — TROPONIN I: Troponin I: <0.03 ng/mL (ref <0.03)

## 2018-12-29 LAB — D-DIMER, QUANTITATIVE: D-Dimer, Quant: >20 ug/mL-FEU — ABNORMAL HIGH (ref 0.00–0.50)

## 2018-12-29 MED ORDER — PANTOPRAZOLE SODIUM 40 MG PO TBEC: 40.0000 mg | DELAYED_RELEASE_TABLET | Freq: Two times a day (BID) | ORAL | 1 refills | Status: DC

## 2018-12-29 MED ORDER — POLYETHYLENE GLYCOL 3350 17 G PO PACK
17.0000 g | PACK | Freq: Every day | ORAL | Status: DC
  Administered 2018-12-29: 17 g via ORAL
  Filled 2018-12-29: qty 1

## 2018-12-29 MED ORDER — SENNA 8.6 MG PO TABS
2.0000 | ORAL_TABLET | Freq: Every day | ORAL | Status: DC
  Administered 2018-12-29: 17.2 mg via ORAL
  Filled 2018-12-29: qty 2

## 2018-12-29 NOTE — Discharge Summary (Signed)
Physician Discharge Summary  Walter Boone. ION:[id number] DOB: 1951-05-01 DOA: 12/28/2018  PCP: Dettinger, Fransisca Kaufmann, MD  Admit date: 12/28/2018 Discharge date: 12/29/2018  Admitted From: Home Disposition:  Home   Recommendations for Outpatient Follow-up:  1. Follow up with PCP in 1 week 2. Please obtain CBC in one week 3. Please send nonurgent referral to cardiology for abnormal echocardiogram   Discharge Condition: Stable CODE STATUS: FULL Diet recommendation:  Regular   Brief/Interim Summary: 68 y.o. male with medical history of metastatic prostate cancer to bone, hypertension, neoplasm related pain presented to [facility] today for routine visit.  Blood work revealed that he had hemoglobin of 5.2.  As result, the patient was sent to the emergency department for further evaluation.  The patient states that he has been having nausea, decreased oral intake, and dark stools for approximately 3 to 4 weeks.  He states that he was started on iron approximately 3 weeks ago.  He is been complaining some dyspnea on exertion.  He has been having chronic chest pain at the center of his chest along with back pain early 6 months for which she has been taking hydrocodone and tramadol.  He denies any hematochezia, hematemesis, hematuria.  He has been feeling more fatigued.  He states that certain movements and palpation has made his chest pain worse. In the emergency department, the patient was afebrile hemodynamically stable saturating 100% room air.  BMP and LFTs were essentially unremarkable.  Hemoglobin was noted to be 4.8.  Initial troponin was negative.  EKG showed sinus rhythm with nonspecific T wave changes.  Urinalysis was negative for hematuria or pyuria.   Hemoccult was negative.  The patient was transfused 2 units PRBC.  He symptomatically improved.  His shortness of breath, fatigue improved although he has chronic malaise.  The patient's hemoglobin improved to 7.9 after 3 units  PRBC.  His hemoglobin remained stable on repeat CBC after transfusion.  Further work-up was discussed with the patient; however, since the patient was feeling better he wanted to go home and defer any further work-up in the outpatient setting.  Discharge Diagnoses:  Symptomatic anemia -Hemoccult was negative in the emergency department -10/11/2018 bone marrow biopsy--complete infiltration by prostate cancer cells -Patient had received 2 units PRBC 11/28/2018 -Baseline hemoglobin 7-8 -Transfused 3 units PRBC total for the admission -Hemoglobin improved to 7.9, and remained stable thereafter on recheck -Case was discussed with GI, Dr. Vicenta Aly hemoglobins for now; may need endoscopy if continues to be symptomatic without improvement -12/10/2018 iron saturation 57%, ferritin 1798 -B12 greater than 2440, folic acid 6.6 -Continue PPI twice daily x1 month, then once daily  Metastatic prostate cancer to the bone -pt followed by Dr. Delton Coombes -currently on Xtandi and [name]  Essential hypertension -Holding amlodipine secondary to soft blood pressure  Neoplasm related pain -Continue tramadol and hydrocodone  Thrombocytopenia -Likely related to pulmonary infiltration from his prostate cancer -Monitor for signs of bleeding -Serial CBCs--stable  Atypical chest pain -Cycle troponins--neg  X 2 -Reproducible by palpation -EKG--sinus rhythm with nonspecific T wave changes -No hypoxia. -12/30/2018 echo--EF 35-60%, HK distal anterolateral myocardium, PASP 35--> outpatient follow-up with cardiology   Discharge Instructions   Allergies as of 12/29/2018   No Known Allergies     Medication List    STOP taking these medications   amLODipine 10 MG tablet Commonly known as:  NORVASC     TAKE these medications   CALCIUM 1200+D3 PO Take 1 tablet by mouth daily.  Cholecalciferol 50 MCG (2000 UT) Chew Chew 1 tablet by mouth.   diclofenac sodium 1 % Gel Commonly known as:   VOLTAREN Apply 2 g topically 4 (four) times daily. What changed:    when to take this  reasons to take this   EQUATE PLUS Liqd Take 1 Can by mouth 2 (two) times daily.   ferrous sulfate 325 (65 FE) MG tablet Commonly known as:  FERROUSUL Take 1 tablet (325 mg total) by mouth daily with breakfast.   HYDROcodone-acetaminophen 10-325 MG tablet Commonly known as:  NORCO Take 1 tablet by mouth every 8 (eight) hours as needed. What changed:  reasons to take this   [name] IJ Inject 1 application as directed every 6 (six) months.   MUSCLE RUB 10-15 % Crea Apply 1 application topically daily as needed for muscle pain.   OVER THE COUNTER MEDICATION See admin instructions. preggie pop drops - take 1 lozenge as needed.   pantoprazole 40 MG tablet Commonly known as:  PROTONIX Take 1 tablet (40 mg total) by mouth 2 (two) times daily. X one month, then once daily   promethazine 25 MG tablet Commonly known as:  PHENERGAN Take 1 tablet (25 mg total) by mouth every 6 (six) hours as needed for nausea or vomiting.   traMADol 50 MG tablet Commonly known as:  ULTRAM Take 2 tablets (100 mg total) by mouth every 4 (four) hours as needed. What changed:  reasons to take this   vitamin B-12 500 MCG tablet Commonly known as:  CYANOCOBALAMIN Take 500 mcg by mouth daily.   vitamin C 500 MG tablet Commonly known as:  ASCORBIC ACID Take 1 tablet (500 mg total) by mouth daily. Take with iron for better absorption   XGEVA 120 MG/1.7ML Soln injection Generic drug:  denosumab Inject 120 mg into the skin once.   XTANDI 40 MG capsule Generic drug:  enzalutamide TAKE 4 CAPSULES BY MOUTH DAILY What changed:  how much to take       No Known Allergies  Consultations:  Rehman on phone   Procedures/Studies: Dg Chest 2 View  Result Date: 12/28/2018 CLINICAL DATA:  68 y/o M; nausea and weakness. History of prostate cancer. EXAM: CHEST - 2 VIEW COMPARISON:  04/27/2018 bone scan. 04/26/2018  CT chest. 01/28/2018 chest radiograph. FINDINGS: Stable normal cardiac silhouette. Clear lungs. No pleural effusion or pneumothorax. No acute osseous abnormality is evident. Diffuse sclerotic bony metastasis. IMPRESSION: No acute pulmonary process identified. Electronically Signed   By: Kristine Garbe M.D.   On: 12/28/2018 13:37        Discharge Exam: Vitals:   12/28/18 2345 12/29/18 0500  BP: 129/71 120/66  Pulse: (!) 101 85  Resp: 18 18  Temp: 98.4 F (36.9 C) 98.8 F (37.1 C)  SpO2: 95% 96%   Vitals:   12/28/18 2115 12/28/18 2140 12/28/18 2345 12/29/18 0500  BP: 127/66 130/70 129/71 120/66  Pulse: 95 95 (!) 101 85  Resp: 18 16 18 18   Temp: 99.9 F (37.7 C) 99.8 F (37.7 C) 98.4 F (36.9 C) 98.8 F (37.1 C)  TempSrc: Oral Oral Oral Oral  SpO2: 97% 95% 95% 96%  Weight:      Height:        General: Pt is alert, awake, not in acute distress Cardiovascular: RRR, S1/S2 +, no rubs, no gallops Respiratory: CTA bilaterally, no wheezing, no rhonchi Abdominal: Soft, NT, ND, bowel sounds + Extremities: no edema, no cyanosis   The results of significant diagnostics  from this hospitalization (including imaging, microbiology, ancillary and laboratory) are listed below for reference.    Significant Diagnostic Studies: Dg Chest 2 View  Result Date: 12/28/2018 CLINICAL DATA:  69 y/o M; nausea and weakness. History of prostate cancer. EXAM: CHEST - 2 VIEW COMPARISON:  04/27/2018 bone scan. 04/26/2018 CT chest. 01/28/2018 chest radiograph. FINDINGS: Stable normal cardiac silhouette. Clear lungs. No pleural effusion or pneumothorax. No acute osseous abnormality is evident. Diffuse sclerotic bony metastasis. IMPRESSION: No acute pulmonary process identified. Electronically Signed   By: Kristine Garbe M.D.   On: 12/28/2018 13:37     Microbiology: No results found for this or any previous visit (from the past 240 hour(s)).   Labs: Basic Metabolic Panel: Recent  Labs  Lab 12/28/18 1045 12/28/18 1301 12/29/18 0611  NA 137 137 137  K 3.5 3.7 4.1  CL 101 103 107  CO2 23 25 22   GLUCOSE 191* 179* 146*  BUN 29* 28* 19  CREATININE 1.08 0.99 0.81  CALCIUM 7.6* 7.7* 7.6*   Liver Function Tests: Recent Labs  Lab 12/28/18 1045 12/28/18 1301  AST 39 42*  ALT 17 18  ALKPHOS 199* 192*  BILITOT 0.7 0.5  PROT 7.0 6.5  ALBUMIN 3.1* 3.0*   Recent Labs  Lab 12/28/18 1301  LIPASE 26   No results for input(s): AMMONIA in the last 168 hours. CBC: Recent Labs  Lab 12/28/18 1045 12/28/18 1301 12/29/18 0611 12/29/18 1214  WBC 4.9 4.7 5.0 4.9  NEUTROABS 3.0 2.6  --   --   HGB 5.2* 4.8* 7.9* 8.0*  HCT 17.8* 16.1* 24.9* 25.5*  MCV 95.7 95.3 92.6 92.4  PLT 159 152 125* 120*   Cardiac Enzymes: Recent Labs  Lab 12/28/18 1301 12/29/18 0611  TROPONINI <0.03 <0.03   BNP: Invalid input(s): POCBNP CBG: No results for input(s): GLUCAP in the last 168 hours.  Time coordinating discharge:  36 minutes  Signed:  Orson Eva, DO [facility] Pager: [phone] 12/29/2018, 2:11 PM

## 2018-12-29 NOTE — Progress Notes (Signed)
*  PRELIMINARY RESULTS* Echocardiogram 2D Echocardiogram has been performed.  Walter Boone 12/29/2018, 12:02 PM

## 2018-12-30 LAB — HIV ANTIBODY (ROUTINE TESTING W REFLEX): HIV Screen 4th Generation wRfx: NONREACTIVE

## 2019-01-01 LAB — TYPE AND SCREEN
ABO/RH(D): A NEG
ANTIBODY SCREEN: NEGATIVE
Unit division: 0
Unit division: 0
Unit division: 0
Unit division: 0
Unit division: 0

## 2019-01-01 LAB — BPAM RBC
Blood Product Expiration Date: 202001282359
Blood Product Expiration Date: 202002152359
Blood Product Expiration Date: 202002192359
Blood Product Expiration Date: 202002202359
Blood Product Expiration Date: 202002202359
ISSUE DATE / TIME: 202001171433
ISSUE DATE / TIME: 202001171834
ISSUE DATE / TIME: 202001172103
UNIT TYPE AND RH: 600
Unit Type and Rh: 600
Unit Type and Rh: 600
Unit Type and Rh: 600
Unit Type and Rh: 600

## 2019-01-04 ENCOUNTER — Ambulatory Visit (INDEPENDENT_AMBULATORY_CARE_PROVIDER_SITE_OTHER): Payer: Medicare HMO | Admitting: Family

## 2019-01-04 ENCOUNTER — Encounter: Payer: Self-pay | Admitting: Family

## 2019-01-04 VITALS — BP 138/83 | HR 89 | Temp 98.0°F | Ht 66.0 in | Wt 170.2 lb

## 2019-01-04 DIAGNOSIS — D649 Anemia, unspecified: Secondary | ICD-10-CM | POA: Diagnosis not present

## 2019-01-04 DIAGNOSIS — R931 Abnormal findings on diagnostic imaging of heart and coronary circulation: Secondary | ICD-10-CM | POA: Diagnosis not present

## 2019-01-04 DIAGNOSIS — C61 Malignant neoplasm of prostate: Secondary | ICD-10-CM

## 2019-01-04 DIAGNOSIS — C7951 Secondary malignant neoplasm of bone: Secondary | ICD-10-CM | POA: Diagnosis not present

## 2019-01-04 DIAGNOSIS — Z09 Encounter for follow-up examination after completed treatment for conditions other than malignant neoplasm: Secondary | ICD-10-CM | POA: Diagnosis not present

## 2019-01-04 LAB — HEMOGLOBIN, FINGERSTICK: Hemoglobin: 8.5 g/dL — ABNORMAL LOW (ref 12.6–17.7)

## 2019-01-04 MED ORDER — TRAMADOL HCL 50 MG PO TABS: 100.0000 mg | ORAL_TABLET | ORAL | 2 refills | Status: DC | PRN

## 2019-01-04 NOTE — Patient Instructions (Signed)
Anemia  Anemia is a condition in which you do not have enough red blood cells or hemoglobin. Hemoglobin is a substance in red blood cells that carries oxygen. When you do not have enough red blood cells or hemoglobin (are anemic), your body cannot get enough oxygen and your organs may not work properly. As a result, you may feel very tired or have other problems. What are the causes? Common causes of anemia include:  Excessive bleeding. Anemia can be caused by excessive bleeding inside or outside the body, including bleeding from the intestine or from periods in women.  Poor nutrition.  Long-lasting (chronic) kidney, thyroid, and liver disease.  Bone marrow disorders.  Cancer and treatments for cancer.  HIV (human immunodeficiency virus) and AIDS (acquired immunodeficiency syndrome).  Treatments for HIV and AIDS.  Spleen problems.  Blood disorders.  Infections, medicines, and autoimmune disorders that destroy red blood cells. What are the signs or symptoms? Symptoms of this condition include:  Minor weakness.  Dizziness.  Headache.  Feeling heartbeats that are irregular or faster than normal (palpitations).  Shortness of breath, especially with exercise.  Paleness.  Cold sensitivity.  Indigestion.  Nausea.  Difficulty sleeping.  Difficulty concentrating. Symptoms may occur suddenly or develop slowly. If your anemia is mild, you may not have symptoms. How is this diagnosed? This condition is diagnosed based on:  Blood tests.  Your medical history.  A physical exam.  Bone marrow biopsy. Your health care provider may also check your stool (feces) for blood and may do additional testing to look for the cause of your bleeding. You may also have other tests, including:  Imaging tests, such as a CT scan or MRI.  Endoscopy.  Colonoscopy. How is this treated? Treatment for this condition depends on the cause. If you continue to lose a lot of blood, you may  need to be treated at a hospital. Treatment may include:  Taking supplements of iron, vitamin S31, or folic acid.  Taking a hormone medicine (erythropoietin) that can help to stimulate red blood cell growth.  Having a blood transfusion. This may be needed if you lose a lot of blood.  Making changes to your diet.  Having surgery to remove your spleen. Follow these instructions at home:  Take over-the-counter and prescription medicines only as told by your health care provider.  Take supplements only as told by your health care provider.  Follow any diet instructions that you were given.  Keep all follow-up visits as told by your health care provider. This is important. Contact a health care provider if:  You develop new bleeding anywhere in the body. Get help right away if:  You are very weak.  You are short of breath.  You have pain in your abdomen or chest.  You are dizzy or feel faint.  You have trouble concentrating.  You have bloody or black, tarry stools.  You vomit repeatedly or you vomit up blood. Summary  Anemia is a condition in which you do not have enough red blood cells or enough of a substance in your red blood cells that carries oxygen (hemoglobin).  Symptoms may occur suddenly or develop slowly.  If your anemia is mild, you may not have symptoms.  This condition is diagnosed with blood tests as well as a medical history and physical exam. Other tests may be needed.  Treatment for this condition depends on the cause of the anemia. This information is not intended to replace advice given to you by  your health care provider. Make sure you discuss any questions you have with your health care provider. Document Released: 01/05/2005 Document Revised: 12/30/2016 Document Reviewed: 12/30/2016 Elsevier Interactive Patient Education  2019 [employer].

## 2019-01-04 NOTE — Progress Notes (Signed)
Subjective:    [name]., male    DOB: [birth year], 68 y.o.   MRN: [id number]  Chief Complaint  Patient presents with  . Hospitalization Follow-up    HPI Pt went to the ED on 12/28/18 for anemia. He has metastatic prostate cancer. It was found that he had a hgb of 4.8 and was given 3 units of PRBC. His Hgb improved to 8.0.   He had dark stools for 3-4 weeks. He was complaining of chest pain, SOB, and fatigue. However, today he denies any SOB or chest pain.  He reports he is having constant bilateral shoulders and lower back of 7 out 10.    He had an Echo that showed an EF of 35-60%. We will place referral to Cardiologists today.    Review of Systems  Constitutional: Positive for fatigue.  Musculoskeletal: Positive for back pain.  Skin: Positive for pallor.  All other systems reviewed and are negative.      Objective:   Physical Exam Vitals signs reviewed.  Constitutional:      General: He is not in acute distress.    Appearance: He is well-developed. He is ill-appearing.  HENT:     Right Ear: Tympanic membrane normal.     Left Ear: Tympanic membrane normal.  Eyes:     General:        Right eye: No discharge.        Left eye: No discharge.     Pupils: Pupils are equal, round, and reactive to light.  Neck:     Musculoskeletal: Normal range of motion and neck supple.     Thyroid: No thyromegaly.  Cardiovascular:     Rate and Rhythm: Normal rate and regular rhythm.     Heart sounds: Murmur present.  Pulmonary:     Effort: Pulmonary effort is normal. No respiratory distress.     Breath sounds: Normal breath sounds. No wheezing.  Abdominal:     General: Bowel sounds are normal. There is no distension.     Palpations: Abdomen is soft.     Tenderness: There is no abdominal tenderness.  Musculoskeletal:        General: Tenderness present.     Comments: Pain in bilateral shoulders with extension, and pain lumbar with flexion  Skin:    General:  Skin is warm and dry.     Coloration: Skin is pale.     Findings: No erythema or rash.  Neurological:     Mental Status: He is alert and oriented to person, place, and time.     Cranial Nerves: No cranial nerve deficit.     Deep Tendon Reflexes: Reflexes are normal and symmetric.  Psychiatric:        Behavior: Behavior normal.        Thought Content: Thought content normal.        Judgment: Judgment normal.       BP 138/83   Pulse 89   Temp 98 F (36.7 C) (Oral)   Ht _0  (1.676 m)   Wt 170 lb 3.2 oz (77.2 kg)   BMI 27.47 kg/m      Assessment & Plan:  Walter Boone. comes in today with chief complaint of Hospitalization Follow-up   Diagnosis and orders addressed:  1. Hospital discharge follow-up - Hemoglobin, fingerstick - CBC with Differential/Platelet - BMP8+EGFR  2. Prostate cancer metastatic to bone (HCC) - traMADol (ULTRAM) 50 MG tablet; Take 2 tablets (100 mg  total) by mouth every 4 (four) hours as needed.  Dispense: 180 tablet; Refill: 2 - CBC with Differential/Platelet - BMP8+EGFR  3. Symptomatic anemia - traMADol (ULTRAM) 50 MG tablet; Take 2 tablets (100 mg total) by mouth every 4 (four) hours as needed.  Dispense: 180 tablet; Refill: 2 - CBC with Differential/Platelet - BMP8+EGFR  4. Abnormal echocardiogram - Ambulatory referral to Cardiology - CBC with Differential/Platelet - BMP8+EGFR   Labs pending We will refill Ultram today, PT reviewed in [place] controlled database. I have discussed this refill with his PCP.  Keep all follow up with PCP and Oncologists    Evelina Dun, FNP

## 2019-01-05 LAB — CBC WITH DIFFERENTIAL/PLATELET

## 2019-01-05 LAB — BMP8+EGFR
BUN/Creatinine Ratio: 22 (ref 10–24)
BUN: 16 mg/dL (ref 8–27)
CO2: 20 mmol/L (ref 20–29)
Calcium: 8.1 mg/dL — ABNORMAL LOW (ref 8.6–10.2)
Chloride: 107 mmol/L — ABNORMAL HIGH (ref 96–106)
Creatinine, Ser: 0.74 mg/dL — ABNORMAL LOW (ref 0.76–1.27)
GFR calc Af Amer: 112 mL/min/{1.73_m2} (ref >59)
GFR, EST NON AFRICAN AMERICAN: 97 mL/min/{1.73_m2} (ref >59)
Glucose: 132 mg/dL — ABNORMAL HIGH (ref 65–99)
Potassium: 4.3 mmol/L (ref 3.5–5.2)
Sodium: 141 mmol/L (ref 134–144)

## 2019-01-11 ENCOUNTER — Encounter: Payer: Self-pay | Admitting: Family Medicine

## 2019-01-11 ENCOUNTER — Ambulatory Visit (INDEPENDENT_AMBULATORY_CARE_PROVIDER_SITE_OTHER): Payer: Medicare HMO | Admitting: Family Medicine

## 2019-01-11 ENCOUNTER — Telehealth (HOSPITAL_COMMUNITY): Payer: Self-pay | Admitting: Pharmacist

## 2019-01-11 VITALS — BP 129/75 | HR 102 | Temp 97.9°F | Ht 66.0 in | Wt 170.2 lb

## 2019-01-11 DIAGNOSIS — C61 Malignant neoplasm of prostate: Secondary | ICD-10-CM | POA: Diagnosis not present

## 2019-01-11 DIAGNOSIS — D649 Anemia, unspecified: Secondary | ICD-10-CM | POA: Diagnosis not present

## 2019-01-11 DIAGNOSIS — C7951 Secondary malignant neoplasm of bone: Secondary | ICD-10-CM | POA: Diagnosis not present

## 2019-01-11 MED ORDER — OXYCODONE-ACETAMINOPHEN 10-325 MG PO TABS: 1.0000 | ORAL_TABLET | Freq: Three times a day (TID) | ORAL | 0 refills | Status: DC | PRN

## 2019-01-11 MED ORDER — TRAMADOL HCL 50 MG PO TABS: 100.0000 mg | ORAL_TABLET | ORAL | 2 refills | Status: AC | PRN

## 2019-01-11 NOTE — Telephone Encounter (Signed)
Oral Chemotherapy Pharmacist Encounter   Attempted to reach patient for follow up on oral medication: Xtandi. No answer. Left VM for patient to call back.    Darl Pikes, PharmD, BCPS, [facility]ology/Oncology Clinical Pharmacist [facility] [phone]  01/11/2019 1:31 PM

## 2019-01-11 NOTE — Progress Notes (Signed)
BP 129/75   Pulse (!) 102   Temp 97.9 F (36.6 C) (Oral)   Ht 5\' 6"  (1.676 m)   Wt 170 lb 3.2 oz (77.2 kg)   BMI 27.47 kg/m    Subjective:    Patient ID: Walter Flake., male    DOB: 1951-03-31, 68 y.o.   MRN: [id number]  HPI: Walter Knierim. is a 68 y.o. male presenting on 01/11/2019 for Anemia (4 week follow up) and Hip Pain (left x 1 week)   HPI Bony metastasis from prostate cancer Patient is coming in for recheck on pain from the bony metastasis to his spine neck and shoulders from the prostate cancer.  He has been using tramadol during the day because of the hydrocodone is too strong for daytime but he says he has been using the hydrocodone at night but the hydrocodone at night is not strong enough to help him sleep so is wondering if he can get something stronger to take at night and continue to use the tramadol during the daytime.  He rates his pain is mild to moderate in severity right now  Anemia Patient is coming in for recheck on anemia.  He was symptomatic and had his blood counts down to a hemoglobin of 4 when he was admitted about a month ago.  Today in the office he still feels generally weak but denies any lightheadedness or dizziness or chest pain or feeling faint like he had been previously.  He is feeling better but his energy is just still down.  He is coming in for recheck.  He denies any bruising or bleeding problems.  Relevant past medical, surgical, family and social history reviewed and updated as indicated. Interim medical history since our last visit reviewed. Allergies and medications reviewed and updated.  Review of Systems  Constitutional: Positive for fatigue. Negative for chills and fever.  Eyes: Negative for visual disturbance.  Respiratory: Negative for shortness of breath and wheezing.   Cardiovascular: Negative for chest pain and leg swelling.  Musculoskeletal: Positive for arthralgias, back pain and myalgias. Negative for gait  problem.  Skin: Negative for rash.  Neurological: Negative for dizziness, weakness and numbness.  All other systems reviewed and are negative.   Per HPI unless specifically indicated above   Allergies as of 01/11/2019   No Known Allergies     Medication List       Accurate as of January 11, 2019 11:59 PM. Always use your most recent med list.        CALCIUM 1200+D3 PO Take 1 tablet by mouth daily.   Cholecalciferol 50 MCG (2000 UT) Chew Chew 1 tablet by mouth.   diclofenac sodium 1 % Gel Commonly known as:  VOLTAREN Apply 2 g topically 4 (four) times daily.   EQUATE PLUS Liqd Take 1 Can by mouth 2 (two) times daily.   ferrous sulfate 325 (65 FE) MG tablet Commonly known as:  FERROUSUL Take 1 tablet (325 mg total) by mouth daily with breakfast.   HYDROcodone-acetaminophen 10-325 MG tablet Commonly known as:  NORCO Take 1 tablet by mouth every 8 (eight) hours as needed.   [name] IJ Inject 1 application as directed every 6 (six) months.   MUSCLE RUB 10-15 % Crea Apply 1 application topically daily as needed for muscle pain.   OVER THE COUNTER MEDICATION See admin instructions. preggie pop drops - take 1 lozenge as needed.   oxyCODONE-acetaminophen 10-325 MG tablet Commonly known as:  PERCOCET  Take 1 tablet by mouth every 8 (eight) hours as needed for pain.   promethazine 25 MG tablet Commonly known as:  PHENERGAN Take 1 tablet (25 mg total) by mouth every 6 (six) hours as needed for nausea or vomiting.   traMADol 50 MG tablet Commonly known as:  ULTRAM Take 2 tablets (100 mg total) by mouth every 4 (four) hours as needed.   vitamin B-12 500 MCG tablet Commonly known as:  CYANOCOBALAMIN Take 500 mcg by mouth daily.   vitamin C 500 MG tablet Commonly known as:  ASCORBIC ACID Take 1 tablet (500 mg total) by mouth daily. Take with iron for better absorption   XGEVA 120 MG/1.7ML Soln injection Generic drug:  denosumab Inject 120 mg into the skin once.            Objective:    BP 129/75   Pulse (!) 102   Temp 97.9 F (36.6 C) (Oral)   Ht 5\' 6"  (1.676 m)   Wt 170 lb 3.2 oz (77.2 kg)   BMI 27.47 kg/m   Wt Readings from Last 3 Encounters:  01/11/19 170 lb 3.2 oz (77.2 kg)  01/04/19 170 lb 3.2 oz (77.2 kg)  12/28/18 174 lb 2.6 oz (79 kg)    Physical Exam Vitals signs and nursing note reviewed.  Constitutional:      General: He is not in acute distress.    Appearance: He is well-developed. He is not diaphoretic.  Eyes:     General: No scleral icterus.    Conjunctiva/sclera: Conjunctivae normal.  Neck:     Musculoskeletal: Neck supple.     Thyroid: No thyromegaly.  Cardiovascular:     Rate and Rhythm: Normal rate and regular rhythm.     Heart sounds: Normal heart sounds. No murmur.  Pulmonary:     Effort: Pulmonary effort is normal. No respiratory distress.     Breath sounds: Normal breath sounds. No wheezing.  Musculoskeletal:        General: Tenderness (Pain in shoulders hands and back) present.  Lymphadenopathy:     Cervical: No cervical adenopathy.  Skin:    General: Skin is warm and dry.     Findings: No rash.  Neurological:     Mental Status: He is alert and oriented to person, place, and time.     Coordination: Coordination normal.  Psychiatric:        Behavior: Behavior normal.         Assessment & Plan:   Problem List Items Addressed This Visit      Musculoskeletal and Integument   Prostate cancer metastatic to bone (HCC)   Relevant Medications   oxyCODONE-acetaminophen (PERCOCET) 10-325 MG tablet   traMADol (ULTRAM) 50 MG tablet     Other   Symptomatic anemia   Relevant Medications   oxyCODONE-acetaminophen (PERCOCET) 10-325 MG tablet   traMADol (ULTRAM) 50 MG tablet   Other Relevant Orders   CBC with Differential/Platelet (Completed)    Other Visit Diagnoses    Bony metastasis ([place])    -  Primary   Relevant Medications   oxyCODONE-acetaminophen (PERCOCET) 10-325 MG tablet   traMADol  (ULTRAM) 50 MG tablet       Follow up plan: Return in about 2 months (around 03/12/2019), or if symptoms worsen or fail to improve.  Counseling provided for all of the vaccine components Orders Placed This Encounter  Procedures  . CBC with Differential/Platelet  . Immature Cells    Caryl Pina, MD [facility] 01/17/2019, 8:55 PM

## 2019-01-12 ENCOUNTER — Telehealth: Payer: Self-pay | Admitting: Family Medicine

## 2019-01-12 NOTE — Telephone Encounter (Signed)
PT wife was told to call this morning to get lab results on the pt. Advised her that results had not been reviewed by provider, will see if on call provider can review and nurse call back with results.

## 2019-01-12 NOTE — Telephone Encounter (Signed)
Pt's wife aware.

## 2019-01-12 NOTE — Telephone Encounter (Signed)
Can you look at his results from yesterday? Especially the Immature Cells? Or does this need to wait until Dr Dettinger is back Tuesday?

## 2019-01-12 NOTE — Telephone Encounter (Signed)
I looked at it. It is stable to improved from previous checks over the last 2 -3 weeks

## 2019-01-15 LAB — CBC WITH DIFFERENTIAL/PLATELET
Basophils Absolute: 0 10*3/uL (ref 0.0–0.2)
Basos: 0 %
EOS (ABSOLUTE): 0.1 10*3/uL (ref 0.0–0.4)
Eos: 2 %
Hematocrit: 24.4 % — ABNORMAL LOW (ref 37.5–51.0)
Hemoglobin: 8.5 g/dL — CL (ref 13.0–17.7)
Lymphocytes Absolute: 1.3 10*3/uL (ref 0.7–3.1)
Lymphs: 18 %
MCH: 30.7 pg (ref 26.6–33.0)
MCHC: 34.8 g/dL (ref 31.5–35.7)
MCV: 88 fL (ref 79–97)
Monocytes Absolute: 0.3 10*3/uL (ref 0.1–0.9)
Monocytes: 4 %
NRBC: 2 % — ABNORMAL HIGH (ref 0–0)
Neutrophils Absolute: 5.2 10*3/uL (ref 1.4–7.0)
Neutrophils: 68 %
PLATELETS: 201 10*3/uL (ref 150–450)
RBC: 2.77 x10E6/uL — ABNORMAL LOW (ref 4.14–5.80)
RDW: 17.1 % — ABNORMAL HIGH (ref 11.6–15.4)
WBC: 7.3 10*3/uL (ref 3.4–10.8)

## 2019-01-15 LAB — IMMATURE CELLS
Bands(Auto) Relative: 3 %
MYELOCYTES: 1 % — ABNORMAL HIGH (ref 0–0)
Metamyelocytes: 4 % — ABNORMAL HIGH (ref 0–0)

## 2019-01-24 ENCOUNTER — Inpatient Hospital Stay (HOSPITAL_COMMUNITY): Payer: Medicare HMO | Attending: Hematology | Admitting: Genetic Counselor

## 2019-01-24 ENCOUNTER — Encounter (HOSPITAL_COMMUNITY): Payer: Self-pay | Admitting: Genetic Counselor

## 2019-01-24 ENCOUNTER — Other Ambulatory Visit (HOSPITAL_COMMUNITY): Payer: Self-pay

## 2019-01-24 DIAGNOSIS — C61 Malignant neoplasm of prostate: Secondary | ICD-10-CM | POA: Diagnosis not present

## 2019-01-24 DIAGNOSIS — Z1379 Encounter for other screening for genetic and chromosomal anomalies: Secondary | ICD-10-CM

## 2019-01-24 DIAGNOSIS — Z800 Family history of malignant neoplasm of digestive organs: Secondary | ICD-10-CM | POA: Diagnosis not present

## 2019-01-24 NOTE — Progress Notes (Signed)
REFERRING PROVIDER: [name], MD [name], [place] [address]  PRIMARY PROVIDER:  Dettinger, Fransisca Kaufmann, MD  PRIMARY REASON FOR VISIT:  1. Primary prostate cancer with metastasis from prostate to other site [facility])   2. Family history of colon cancer      HISTORY OF PRESENT ILLNESS:   Walter Boone, a 68 y.o. male, was seen for a [place] cancer genetics consultation at the request of Dr. Delton Coombes due to a personal and family history of cancer.  Walter Boone presents to clinic today to discuss the possibility of a hereditary predisposition to cancer, genetic testing, and to further clarify his future cancer risks, as well as potential cancer risks for family members.   In 2005, at the age of 67, Walter Boone was diagnosed with prostate cancer. This was treated with radioactive seeds, [name], and other medication.     CANCER HISTORY:    Primary prostate cancer with metastasis from prostate to other site [facility])   06/20/2011 Initial Diagnosis    Prostate cancer ([place])    01/12/2016 Imaging    Bone density- This patient is considered osteopenic by World Healh Organization (WHO) Criteria.     03/16/2016 Imaging    CT CAP- No evidence of metastatic disease within the chest, abdomen, or pelvis. No other acute findings identified.    06/17/2016 Imaging    Bone scan- 1. Negative for metastatic pattern.    07/11/2016 Miscellaneous    Prolia every 6 months for osteopenia.    09/12/2016 -  Chemotherapy    Casodex/Depo-[name] every 3 months     09/27/2016 Imaging    CT CAP -No acute findings and no evidence for metastatic disease within the chest, abdomen or pelvis.    11/13/2017 Progression    Bone Scan: Multiple new abnormal sites of increased osseous tracer accumulation are identified as above consistent with osseous metastatic disease.  These include new foci of uptake at the proximal femora bilaterally.    11/16/2017 Progression    CT CAP-IMPRESSION: 1. New  enlarged right pelvic sidewall lymph node and borderline left periaortic lymph node worrisome for metastatic adenopathy. 2. Subtle areas of sclerosis within the lumbar spine are more conspicuous than on the previous exam and worrisome for metastatic disease. Overall the areas of bone metastasis are more better demonstrated on bone scan from 11/13/2017 which demonstrate a progression of bone metastasis.       Past Medical History:  Diagnosis Date  . Acid reflux   . Alcoholism ([place])   . Arthritis   . Bony metastasis ([place])   . Chronic back pain   . Diverticulosis   . Family history of colon cancer   . Fatty liver   . Gynecomastia, male 01/11/2013   Secondary to prostate ca Tx.   . H/O Bell's palsy   . Hemorrhoids    history  . High cholesterol   . History of back injury 11/19/2013  . Hyperglycemia   . Hyperlipidemia   . Hypertension   . Prostate cancer ([place])   . Prostate cancer ([place]) 06/20/2011  . Prostate carcinoma, recurrent ([place]) 08/10/2012    Past Surgical History:  Procedure Laterality Date  . [place]   left  . COLONOSCOPY  2008  . CYSTOSCOPY WITH INSERTION OF UROLIFT N/A 10/16/2018   Procedure: CYSTOSCOPY WITH INSERTION OF UROLIFT;  Surgeon: Franchot Gallo, MD;  Location: [facility];  Service: Urology;  Laterality: N/A;  . ESOPHAGOGASTRODUODENOSCOPY    . HAND SURGERY  nerve was torn - left hand  . HERNIA REPAIR  2010  . I&D EXTREMITY Left 06/10/2015   Procedure: IRRIGATION AND DEBRIDEMENT EXTREMITY LEFT HAND EXPLORATION, nerve repair;  Surgeon: Charlotte Crumb, MD;  Location: [place];  Service: Orthopedics;  Laterality: Left;  . KNEE SURGERY     left  . PROSTATE BIOPSY  11/06    Social History   Socioeconomic History  . Marital status: Married    Spouse name: Not on file  . Number of children: 4  . Years of education: Not on file  . Highest education level: GED or equivalent  Occupational History  . Occupation: Disabled  Social Needs  .  Financial resource strain: Not very hard  . Food insecurity:    Worry: Sometimes true    Inability: Sometimes true  . Transportation needs:    Medical: No    Non-medical: No  Tobacco Use  . Smoking status: Former Smoker    Packs/day: 2.50    Years: 3.00    Pack years: 7.50  . Smokeless tobacco: Never Used  Substance and Sexual Activity  . Alcohol use: No    Comment: former drinker 20 years ago  . Drug use: No  . Sexual activity: Not Currently  Lifestyle  . Physical activity:    Days per week: 0 days    Minutes per session: 0 min  . Stress: Not at all  Relationships  . Social connections:    Talks on phone: More than three times a week    Gets together: More than three times a week    Attends religious service: Not on file    Active member of club or organization: Not on file    Attends meetings of clubs or organizations: Not on file    Relationship status: Married  Other Topics Concern  . Not on file  Social History Narrative  . Not on file     FAMILY HISTORY:  We obtained a detailed, 4-generation family history.  Significant diagnoses are listed below: Family History  Problem Relation Age of Onset  . Heart failure Mother   . Diabetes Father   . Cancer Sister   . Diabetes Brother   . Cancer Sister        ? colon cancer; d. in her 34s  . Muscular dystrophy Sister   . Pneumonia Brother        d. 11 mo  . Lung cancer Nephew     The patient has three sons and a daughter who are all cancer free.  He has nine sisters and two brothers.  One sister died of possibly colon cancer in her 26's and another sister may also have colon cancer.  A third sister died of muscular dystrophy, and a fourth sister has a son with lung cancer.  Both parents are deceased from old age.  The patient's mother had one brother who died in childhood.  Her parents are deceased from non cancer related issues.  The patient's father had five brothers and two sisters.  None had cancer, but he has  a cousin with an unknown form of cancer.  There is no other reported family history of cancer.  Walter Boone is unaware of previous family history of genetic testing for hereditary cancer risks. Patient's maternal ancestors are of [place] descent, and paternal ancestors are of [place] descent. There is no reported Ashkenazi Jewish ancestry. There is no known consanguinity.  GENETIC COUNSELING ASSESSMENT: Walter Platts. is a 68  y.o. male with a personal history of metastatic prostate cancer and a family history of colon cancer which is somewhat suggestive of a hereditary cancer syndrome and predisposition to cancer. We, therefore, discussed and recommended the following at today's visit.   DISCUSSION: We discussed that about 15% of prostate cancer is hereditary.  When we think of young men who have prostate cancer, most commonly this can be the result of BRCA mutations.  However, with the family history of colon cancer, there is a chance that this could be due to Lynch syndrome.   We reviewed the characteristics, features and inheritance patterns of hereditary cancer syndromes. We also discussed genetic testing, including the appropriate family members to test, the process of testing, insurance coverage and turn-around-time for results. We discussed the implications of a negative, positive and/or variant of uncertain significant result. We recommended Walter Boone pursue genetic testing for the common hereditary cancer gene panel. The Hereditary Gene Panel offered by [facility] includes sequencing and/or deletion duplication testing of the following 47 genes: APC, ATM, AXIN2, BARD1, BMPR1A, BRCA1, BRCA2, BRIP1, CDH1, CDK4, CDKN2A (p14ARF), CDKN2A (p16INK4a), CHEK2, CTNNA1, DICER1, EPCAM (Deletion/duplication testing only), GREM1 (promoter region deletion/duplication testing only), KIT, MEN1, MLH1, MSH2, MSH3, MSH6, MUTYH, NBN, NF1, NHTL1, PALB2, PDGFRA, PMS2, POLD1, POLE, PTEN, RAD50, RAD51C, RAD51D,  SDHB, SDHC, SDHD, SMAD4, SMARCA4. STK11, TP53, TSC1, TSC2, and VHL.  The following genes were evaluated for sequence changes only: SDHA and HOXB13 c.251G>A variant only.   Based on Walter Boone's personal and family history of cancer, he meets medical criteria for genetic testing. Despite that he meets criteria, he may still have an out of pocket cost. We discussed that if his out of pocket cost for testing is over $100, the laboratory will call and confirm whether he wants to proceed with testing.  If the out of pocket cost of testing is less than $100 he will be billed by the genetic testing laboratory.   PLAN: After considering the risks, benefits, and limitations, Walter Boone  provided informed consent to pursue genetic testing and the blood sample was sent to [facility] for analysis of the common hereditary cancer panel. Results should be available within approximately 2-3 weeks' time, at which point they will be disclosed by telephone to Walter Boone, as will any additional recommendations warranted by these results. Walter Boone will receive a summary of his genetic counseling visit and a copy of his results once available. This information will also be available in Epic. We encouraged Walter Boone to remain in contact with cancer genetics annually so that we can continuously update the family history and inform him of any changes in cancer genetics and testing that may be of benefit for his family. Walter Boone questions were answered to his satisfaction today. Our contact information was provided should additional questions or concerns arise.  Lastly, we encouraged Walter Boone to remain in contact with cancer genetics annually so that we can continuously update the family history and inform him of any changes in cancer genetics and testing that may be of benefit for this family.   Mr.  Boone questions were answered to his satisfaction today. Our contact information was provided  should additional questions or concerns arise. Thank you for the referral and allowing [place] to share in the care of your patient.   Karen P. Florene Glen, [place], [facility] Certified Genetic Counselor [email or web address] phone: [phone]  The patient was seen for a total of 38 minutes in face-to-face genetic counseling.  This patient  was discussed with Drs. Magrinat, Lindi Adie and/or Burr Medico who agrees with the above.    _______________________________________________________________________ For Office Staff:  Number of people involved in session: 2 Was an Intern/ student involved with case: no

## 2019-01-28 ENCOUNTER — Telehealth (HOSPITAL_COMMUNITY): Payer: Self-pay | Admitting: Pharmacy Technician

## 2019-01-28 NOTE — Telephone Encounter (Signed)
Received word from [facility] that they have tried to call patient 3 times to refill his Xtandi with no response.  I called his home phone and cell phone; no answer on home phone and I left a voicemail on his cell phone.  I will try to reach him again tomorrow.  [place] [facility] [place] Phone [phone] Fax [phone] 01/28/2019 4:18 PM

## 2019-01-29 ENCOUNTER — Observation Stay (HOSPITAL_COMMUNITY)
Admission: EM | Admit: 2019-01-29 | Discharge: 2019-01-30 | Disposition: A | Discharge status: Home / Self Care | Payer: Medicare HMO | Attending: Internal Medicine | Admitting: Internal Medicine

## 2019-01-29 ENCOUNTER — Encounter (HOSPITAL_COMMUNITY): Payer: Self-pay

## 2019-01-29 ENCOUNTER — Other Ambulatory Visit: Payer: Self-pay

## 2019-01-29 ENCOUNTER — Other Ambulatory Visit (HOSPITAL_COMMUNITY): Payer: Self-pay

## 2019-01-29 DIAGNOSIS — Z79899 Other long term (current) drug therapy: Secondary | ICD-10-CM | POA: Insufficient documentation

## 2019-01-29 DIAGNOSIS — I10 Essential (primary) hypertension: Secondary | ICD-10-CM | POA: Insufficient documentation

## 2019-01-29 DIAGNOSIS — C7951 Secondary malignant neoplasm of bone: Secondary | ICD-10-CM

## 2019-01-29 DIAGNOSIS — R531 Weakness: Secondary | ICD-10-CM | POA: Diagnosis not present

## 2019-01-29 DIAGNOSIS — Z87891 Personal history of nicotine dependence: Secondary | ICD-10-CM | POA: Insufficient documentation

## 2019-01-29 DIAGNOSIS — C61 Malignant neoplasm of prostate: Secondary | ICD-10-CM | POA: Diagnosis not present

## 2019-01-29 DIAGNOSIS — D6489 Other specified anemias: Principal | ICD-10-CM | POA: Insufficient documentation

## 2019-01-29 DIAGNOSIS — G894 Chronic pain syndrome: Secondary | ICD-10-CM | POA: Diagnosis not present

## 2019-01-29 DIAGNOSIS — D649 Anemia, unspecified: Secondary | ICD-10-CM | POA: Diagnosis present

## 2019-01-29 LAB — PREPARE RBC (CROSSMATCH)

## 2019-01-29 LAB — CBC WITH DIFFERENTIAL/PLATELET
Abs Immature Granulocytes: 0.42 10*3/uL — ABNORMAL HIGH (ref 0.00–0.07)
Basophils Absolute: 0 10*3/uL (ref 0.0–0.1)
Basophils Relative: 0 %
Eosinophils Absolute: 0.1 10*3/uL (ref 0.0–0.5)
Eosinophils Relative: 2 %
HCT: 23.7 % — ABNORMAL LOW (ref 39.0–52.0)
Hemoglobin: 7.2 g/dL — ABNORMAL LOW (ref 13.0–17.0)
Immature Granulocytes: 8 %
Lymphocytes Relative: 21 %
Lymphs Abs: 1.1 10*3/uL (ref 0.7–4.0)
MCH: 29.9 pg (ref 26.0–34.0)
MCHC: 30.4 g/dL (ref 30.0–36.0)
MCV: 98.3 fL (ref 80.0–100.0)
Monocytes Absolute: 0.6 10*3/uL (ref 0.1–1.0)
Monocytes Relative: 11 %
NEUTROS PCT: 58 %
Neutro Abs: 3.1 10*3/uL (ref 1.7–7.7)
Platelets: 176 10*3/uL (ref 150–400)
RBC: 2.41 MIL/uL — ABNORMAL LOW (ref 4.22–5.81)
RDW: 19.5 % — ABNORMAL HIGH (ref 11.5–15.5)
WBC: 5.3 10*3/uL (ref 4.0–10.5)
nRBC: 3.4 % — ABNORMAL HIGH (ref 0.0–0.2)

## 2019-01-29 LAB — COMPREHENSIVE METABOLIC PANEL
ALK PHOS: 147 U/L — AB (ref 38–126)
ALT: 11 U/L (ref 0–44)
AST: 25 U/L (ref 15–41)
Albumin: 3.8 g/dL (ref 3.5–5.0)
Anion gap: 10 (ref 5–15)
BUN: 25 mg/dL — ABNORMAL HIGH (ref 8–23)
CO2: 20 mmol/L — ABNORMAL LOW (ref 22–32)
Calcium: 8.6 mg/dL — ABNORMAL LOW (ref 8.9–10.3)
Chloride: 107 mmol/L (ref 98–111)
Creatinine, Ser: 0.82 mg/dL (ref 0.61–1.24)
GFR calc Af Amer: >60 mL/min (ref >60)
GFR calc non Af Amer: >60 mL/min (ref >60)
Glucose, Bld: 108 mg/dL — ABNORMAL HIGH (ref 70–99)
Potassium: 3.8 mmol/L (ref 3.5–5.1)
SODIUM: 137 mmol/L (ref 135–145)
Total Bilirubin: 0.5 mg/dL (ref 0.3–1.2)
Total Protein: 7.4 g/dL (ref 6.5–8.1)

## 2019-01-29 MED ORDER — FUROSEMIDE 10 MG/ML IJ SOLN
INTRAMUSCULAR | Status: AC
  Filled 2019-01-29: qty 4

## 2019-01-29 MED ORDER — ACETAMINOPHEN 650 MG RE SUPP: 650.0000 mg | Freq: Four times a day (QID) | RECTAL | Status: DC | PRN

## 2019-01-29 MED ORDER — SODIUM CHLORIDE 0.9% IV SOLUTION
Freq: Once | INTRAVENOUS | Status: AC
  Administered 2019-01-29: 16:00:00 via INTRAVENOUS

## 2019-01-29 MED ORDER — FUROSEMIDE 10 MG/ML IJ SOLN
20.0000 mg | Freq: Once | INTRAMUSCULAR | Status: AC
  Administered 2019-01-29: 20 mg via INTRAVENOUS

## 2019-01-29 MED ORDER — HYDROCODONE-ACETAMINOPHEN 10-325 MG PO TABS
1.0000 | ORAL_TABLET | Freq: Three times a day (TID) | ORAL | Status: DC | PRN
  Administered 2019-01-29 – 2019-01-30 (×2): 1 via ORAL
  Filled 2019-01-29 (×2): qty 1

## 2019-01-29 MED ORDER — POLYETHYLENE GLYCOL 3350 17 G PO PACK: 17.0000 g | PACK | Freq: Every day | ORAL | Status: DC | PRN

## 2019-01-29 MED ORDER — FAMOTIDINE IN NACL 20-0.9 MG/50ML-% IV SOLN
20.0000 mg | Freq: Two times a day (BID) | INTRAVENOUS | Status: DC
  Administered 2019-01-30 (×2): 20 mg via INTRAVENOUS
  Filled 2019-01-29 (×2): qty 50

## 2019-01-29 MED ORDER — ONDANSETRON HCL 4 MG/2ML IJ SOLN: 4.0000 mg | Freq: Four times a day (QID) | INTRAMUSCULAR | Status: DC | PRN

## 2019-01-29 MED ORDER — ACETAMINOPHEN 325 MG PO TABS: 650.0000 mg | ORAL_TABLET | Freq: Four times a day (QID) | ORAL | Status: DC | PRN

## 2019-01-29 MED ORDER — FERROUS SULFATE 325 (65 FE) MG PO TABS
325.0000 mg | ORAL_TABLET | Freq: Every day | ORAL | Status: DC
  Administered 2019-01-30: 325 mg via ORAL
  Filled 2019-01-29: qty 1

## 2019-01-29 MED ORDER — ONDANSETRON HCL 4 MG PO TABS: 4.0000 mg | ORAL_TABLET | Freq: Four times a day (QID) | ORAL | Status: DC | PRN

## 2019-01-29 MED ORDER — SODIUM CHLORIDE 0.9 % IV BOLUS
500.0000 mL | Freq: Once | INTRAVENOUS | Status: AC
  Administered 2019-01-29: 500 mL via INTRAVENOUS

## 2019-01-29 NOTE — ED Provider Notes (Addendum)
[facility] EMERGENCY DEPARTMENT Provider Note   CSN: [id number] Arrival date & time: 01/29/19  1311    History   Chief Complaint Chief Complaint  Patient presents with  . Weakness    HPI Walter Bhat. is a 68 y.o. male.     Patient has a history of metastatic prostate cancer.  Patient comes in here feeling weak and cold.  He states this is happened to him numerous times before and usually he needs a transfusion.  Patient states he is not having any rectal bleeding or black stools  The history is provided by the patient. No language interpreter was used.  Weakness  Severity:  Moderate Onset quality:  Gradual Timing:  Constant Progression:  Worsening Chronicity:  Recurrent Context: not alcohol use   Relieved by:  Nothing Worsened by:  Nothing Ineffective treatments:  None tried Associated symptoms: no abdominal pain, no chest pain, no cough, no diarrhea, no frequency, no headaches and no seizures   Risk factors: anemia     Past Medical History:  Diagnosis Date  . Acid reflux   . Alcoholism ([place])   . Arthritis   . Bony metastasis ([place])   . Chronic back pain   . Diverticulosis   . Family history of colon cancer   . Fatty liver   . Gynecomastia, male 01/11/2013   Secondary to prostate ca Tx.   . H/O Bell's palsy   . Hemorrhoids    history  . High cholesterol   . History of back injury 11/19/2013  . Hyperglycemia   . Hyperlipidemia   . Hypertension   . Prostate cancer ([place])   . Prostate cancer ([place]) 06/20/2011  . Prostate carcinoma, recurrent ([place]) 08/10/2012    Patient Active Problem List   Diagnosis Date Noted  . Family history of colon cancer   . Prostate cancer metastatic to bone ([place])   . Acute blood loss anemia 12/28/2018  . Symptomatic anemia   . Obesity (BMI 30.0-34.9) 05/30/2018  . Chronic right-sided low back pain with right-sided sciatica 07/06/2017  . GERD (gastroesophageal reflux disease) 11/22/2016  . Knee pain, left 02/11/2016  .  Prediabetes 01/27/2016  . Osteopenia determined by x-ray 02/01/2015  . Chest pain 04/02/2014  . Hypertension   . Hyperlipidemia   . Gynecomastia, male 01/11/2013  . Primary prostate cancer with metastasis from prostate to other site [facility]) 06/20/2011  . H N P-CERVICAL 10/05/2009  . CERVICAL SPASM 10/05/2009    Past Surgical History:  Procedure Laterality Date  . [place]   left  . COLONOSCOPY  2008  . CYSTOSCOPY WITH INSERTION OF UROLIFT N/A 10/16/2018   Procedure: CYSTOSCOPY WITH INSERTION OF UROLIFT;  Surgeon: Franchot Gallo, MD;  Location: [facility];  Service: Urology;  Laterality: N/A;  . ESOPHAGOGASTRODUODENOSCOPY    . HAND SURGERY     nerve was torn - left hand  . HERNIA REPAIR  2010  . I&D EXTREMITY Left 06/10/2015   Procedure: IRRIGATION AND DEBRIDEMENT EXTREMITY LEFT HAND EXPLORATION, nerve repair;  Surgeon: Charlotte Crumb, MD;  Location: [place];  Service: Orthopedics;  Laterality: Left;  . KNEE SURGERY     left  . PROSTATE BIOPSY  11/06        Home Medications    Prior to Admission medications   Medication Sig Start Date End Date Taking? Authorizing Provider  Calcium-Magnesium-Vitamin D (CALCIUM 1200+D3 PO) Take 1 tablet by mouth daily.    Yes [provider]  Cholecalciferol 50 MCG (  2000 UT) CHEW Chew 1 tablet by mouth.   Yes [provider]  denosumab (XGEVA) 120 MG/1.7ML SOLN injection Inject 120 mg into the skin once.   Yes [provider]  ferrous sulfate (FERROUSUL) 325 (65 FE) MG tablet Take 1 tablet (325 mg total) by mouth daily with breakfast. 12/10/18  Yes Dettinger, Fransisca Kaufmann, MD  HYDROcodone-acetaminophen (NORCO) 10-325 MG tablet Take 1 tablet by mouth every 8 (eight) hours as needed. Patient taking differently: Take 1 tablet by mouth every 8 (eight) hours as needed for severe pain.  12/10/18  Yes Dettinger, Fransisca Kaufmann, MD  Leuprolide Acetate ([name] IJ) Inject 1 application as directed every 6 (six) months.    Yes  [provider]  Menthol-Methyl Salicylate (MUSCLE RUB) 10-15 % CREA Apply 1 application topically daily as needed for muscle pain.    Yes [provider]  Nutritional Supplements (EQUATE PLUS) LIQD Take 1 Can by mouth 2 (two) times daily. 12/10/18  Yes Dettinger, Fransisca Kaufmann, MD  OVER THE COUNTER MEDICATION See admin instructions. preggie pop drops - take 1 lozenge as needed.    Yes [provider]  oxyCODONE-acetaminophen (PERCOCET) 10-325 MG tablet Take 1 tablet by mouth every 8 (eight) hours as needed for pain. 01/11/19  Yes Dettinger, Fransisca Kaufmann, MD  promethazine (PHENERGAN) 25 MG tablet Take 1 tablet (25 mg total) by mouth every 6 (six) hours as needed for nausea or vomiting. 11/28/18  Yes Lockamy, Randi L, NP-C  traMADol (ULTRAM) 50 MG tablet Take 2 tablets (100 mg total) by mouth every 4 (four) hours as needed. 01/11/19  Yes Dettinger, Fransisca Kaufmann, MD  vitamin B-12 (CYANOCOBALAMIN) 500 MCG tablet Take 500 mcg by mouth daily.   Yes [provider]  vitamin C (ASCORBIC ACID) 500 MG tablet Take 1 tablet (500 mg total) by mouth daily. Take with iron for better absorption 12/10/18  Yes Dettinger, Fransisca Kaufmann, MD  diclofenac sodium (VOLTAREN) 1 % GEL Apply 2 g topically 4 (four) times daily. Patient not taking: Reported on 01/29/2019 07/13/18   Dettinger, Fransisca Kaufmann, MD    Family History Family History  Problem Relation Age of Onset  . Heart failure Mother   . Diabetes Father   . Cancer Sister   . Diabetes Brother   . Cancer Sister        ? colon cancer; d. in her 82s  . Muscular dystrophy Sister   . Pneumonia Brother        d. 11 mo  . Lung cancer Nephew     Social History Social History   Tobacco Use  . Smoking status: Former Smoker    Packs/day: 2.50    Years: 3.00    Pack years: 7.50  . Smokeless tobacco: Never Used  Substance Use Topics  . Alcohol use: No    Comment: former drinker 20 years ago  . Drug use: No     Allergies   Patient has no known  allergies.   Review of Systems Review of Systems  Constitutional: Negative for appetite change and fatigue.  HENT: Negative for congestion, ear discharge and sinus pressure.   Eyes: Negative for discharge.  Respiratory: Negative for cough.   Cardiovascular: Negative for chest pain.  Gastrointestinal: Negative for abdominal pain and diarrhea.  Genitourinary: Negative for frequency and hematuria.  Musculoskeletal: Negative for back pain.  Skin: Negative for rash.  Neurological: Positive for weakness. Negative for seizures and headaches.  Psychiatric/Behavioral: Negative for hallucinations.     Physical  Exam Updated Vital Signs BP 133/77 (BP Location: Right Arm)   Pulse 86   Temp 98.2 F (36.8 C) (Oral)   Resp 19   SpO2 98%   Physical Exam Vitals signs and nursing note reviewed.  Constitutional:      Appearance: He is well-developed.  HENT:     Head: Normocephalic.     Nose: Nose normal.  Eyes:     General: No scleral icterus.    Conjunctiva/sclera: Conjunctivae normal.  Neck:     Musculoskeletal: Neck supple.     Thyroid: No thyromegaly.  Cardiovascular:     Rate and Rhythm: Normal rate and regular rhythm.     Heart sounds: No murmur. No friction rub. No gallop.   Pulmonary:     Breath sounds: No stridor. No wheezing or rales.  Chest:     Chest wall: No tenderness.  Abdominal:     General: There is no distension.     Tenderness: There is no abdominal tenderness. There is no rebound.  Musculoskeletal: Normal range of motion.  Lymphadenopathy:     Cervical: No cervical adenopathy.  Skin:    Findings: No erythema or rash.  Neurological:     Mental Status: He is oriented to person, place, and time.     Motor: No abnormal muscle tone.     Coordination: Coordination normal.  Psychiatric:        Behavior: Behavior normal.      ED Treatments / Results  Labs (all labs ordered are listed, but only abnormal results are displayed) Labs Reviewed  CBC WITH  DIFFERENTIAL/PLATELET - Abnormal; Notable for the following components:      Result Value   RBC 2.41 (*)    Hemoglobin 7.2 (*)    HCT 23.7 (*)    RDW 19.5 (*)    nRBC 3.4 (*)    Abs Immature Granulocytes 0.42 (*)    All other components within normal limits  COMPREHENSIVE METABOLIC PANEL - Abnormal; Notable for the following components:   CO2 20 (*)    Glucose, Bld 108 (*)    BUN 25 (*)    Calcium 8.6 (*)    Alkaline Phosphatase 147 (*)    All other components within normal limits  TYPE AND SCREEN  PREPARE RBC (CROSSMATCH)    EKG None  Radiology No results found.  Procedures Procedures (including critical care time)  Medications Ordered in ED Medications  0.9 %  sodium chloride infusion (Manually program via Guardrails IV Fluids) (has no administration in time range)  sodium chloride 0.9 % bolus 500 mL (0 mLs Intravenous Stopped 01/29/19 1503)   CRITICAL CARE Performed by: Milton Ferguson Total critical care time:45 minutes Critical care time was exclusive of separately billable procedures and treating other patients. Critical care was necessary to treat or prevent imminent or life-threatening deterioration. Critical care was time spent personally by me on the following activities: development of treatment plan with patient and/or surrogate as well as nursing, discussions with consultants, evaluation of patient's response to treatment, examination of patient, obtaining history from patient or surrogate, ordering and performing treatments and interventions, ordering and review of laboratory studies, ordering and review of radiographic studies, pulse oximetry and re-evaluation of patient's condition.   Initial Impression / Assessment and Plan / ED Course  I have reviewed the triage vital signs and the nursing notes.  Pertinent labs & imaging results that were available during my care of the patient were reviewed by me and considered in my  medical decision making (see chart for  details).     Patient with weakness and mild to moderate anemia.  I spoke with his oncologist who agrees with transfusing the patient 2 units of packed cells.  He will be admitted to medicine  Final Clinical Impressions(s) / ED Diagnoses   Final diagnoses:  Weakness    ED Discharge Orders    None       Milton Ferguson, MD 01/29/19 1612    Milton Ferguson, MD 02/17/19 1501

## 2019-01-29 NOTE — ED Notes (Signed)
Pt takes a [name] shot and was taking a Chemo pill for treatment.

## 2019-01-29 NOTE — ED Triage Notes (Signed)
Pt is feeling weak and very cold. States he has the same symptoms as last month when he had to have a blood transfusion. Pt is pale and lethargic.

## 2019-01-29 NOTE — H&P (Signed)
History and Physical    [name] NFA:[id number] DOB: 1951/11/15 DOA: 01/29/2019  PCP: Dettinger, Fransisca Kaufmann, MD   Patient coming from: Home  I have personally briefly reviewed patient's old medical records in [place]  Chief Complaint: Weakness  HPI: Walter Boone. is a 68 y.o. male with medical history significant for prostate cancer with metastasis, hypertension, the ED with complaints of generalized weakness over the past few days and feeling like his blood count was low again.  Patient denies chest pain, no cough or difficulty breathing, no fevers or chills, no burning with urination, no diarrhea or vomiting.  No abdominal pain.  He denies change in stool color (was on iron tablets has not taken past few days).  Took 800 mg ibuprofen once yesterday otherwise denies NSAID use over the past several months.  Recent hospital admission for similar 1/17 to 1/18, with hemoglobin 5.2, transfused with 3 units of blood hemoglobin stable at 7.9 on discharge.  Patient elected to continue further work-up as an outpatient.    ED Course: Stable vitals.  Hemoglobin 7.2, unremarkable BMP, ALP chronically elevated 147, EKG unchanged from prior.  EDP talked to patient's oncologist Dr. Delton Coombes, recommended admission and transfusion of 2 units of blood.  Review of Systems: As per HPI all other systems reviewed and negative   Past Medical History:  Diagnosis Date  . Acid reflux   . Alcoholism ([place])   . Arthritis   . Bony metastasis ([place])   . Chronic back pain   . Diverticulosis   . Family history of colon cancer   . Fatty liver   . Gynecomastia, male 01/11/2013   Secondary to prostate ca Tx.   . H/O Bell's palsy   . Hemorrhoids    history  . High cholesterol   . History of back injury 11/19/2013  . Hyperglycemia   . Hyperlipidemia   . Hypertension   . Prostate cancer ([place])   . Prostate cancer ([place]) 06/20/2011  . Prostate carcinoma, recurrent ([place]) 08/10/2012     Past Surgical History:  Procedure Laterality Date  . [place]   left  . COLONOSCOPY  2008  . CYSTOSCOPY WITH INSERTION OF UROLIFT N/A 10/16/2018   Procedure: CYSTOSCOPY WITH INSERTION OF UROLIFT;  Surgeon: Franchot Gallo, MD;  Location: [facility];  Service: Urology;  Laterality: N/A;  . ESOPHAGOGASTRODUODENOSCOPY    . HAND SURGERY     nerve was torn - left hand  . HERNIA REPAIR  2010  . I&D EXTREMITY Left 06/10/2015   Procedure: IRRIGATION AND DEBRIDEMENT EXTREMITY LEFT HAND EXPLORATION, nerve repair;  Surgeon: Charlotte Crumb, MD;  Location: [facility];  Service: Orthopedics;  Laterality: Left;  . KNEE SURGERY     left  . PROSTATE BIOPSY  11/06     reports that he has quit smoking. He has a 7.50 pack-year smoking history. He has never used smokeless tobacco. He reports that he does not drink alcohol or use drugs.  No Known Allergies  Family History  Problem Relation Age of Onset  . Heart failure Mother   . Diabetes Father   . Cancer Sister   . Diabetes Brother   . Cancer Sister        ? colon cancer; d. in her 38s  . Muscular dystrophy Sister   . Pneumonia Brother        d. 11 mo  . Lung cancer Nephew      Prior to Admission medications  Medication Sig Start Date End Date Taking? Authorizing Provider  Calcium-Magnesium-Vitamin D (CALCIUM 1200+D3 PO) Take 1 tablet by mouth daily.    Yes [provider]  Cholecalciferol 50 MCG (2000 UT) CHEW Chew 1 tablet by mouth.   Yes [provider]  denosumab (XGEVA) 120 MG/1.7ML SOLN injection Inject 120 mg into the skin once.   Yes [provider]  ferrous sulfate (FERROUSUL) 325 (65 FE) MG tablet Take 1 tablet (325 mg total) by mouth daily with breakfast. 12/10/18  Yes Dettinger, Fransisca Kaufmann, MD  HYDROcodone-acetaminophen (NORCO) 10-325 MG tablet Take 1 tablet by mouth every 8 (eight) hours as needed. Patient taking differently: Take 1 tablet by mouth every 8 (eight) hours as needed for severe  pain.  12/10/18  Yes Dettinger, Fransisca Kaufmann, MD  Leuprolide Acetate ([name] IJ) Inject 1 application as directed every 6 (six) months.    Yes [provider]  Menthol-Methyl Salicylate (MUSCLE RUB) 10-15 % CREA Apply 1 application topically daily as needed for muscle pain.    Yes [provider]  Nutritional Supplements (EQUATE PLUS) LIQD Take 1 Can by mouth 2 (two) times daily. 12/10/18  Yes Dettinger, Fransisca Kaufmann, MD  OVER THE COUNTER MEDICATION See admin instructions. preggie pop drops - take 1 lozenge as needed.    Yes [provider]  oxyCODONE-acetaminophen (PERCOCET) 10-325 MG tablet Take 1 tablet by mouth every 8 (eight) hours as needed for pain. 01/11/19  Yes Dettinger, Fransisca Kaufmann, MD  promethazine (PHENERGAN) 25 MG tablet Take 1 tablet (25 mg total) by mouth every 6 (six) hours as needed for nausea or vomiting. 11/28/18  Yes Lockamy, Randi L, NP-C  traMADol (ULTRAM) 50 MG tablet Take 2 tablets (100 mg total) by mouth every 4 (four) hours as needed. 01/11/19  Yes Dettinger, Fransisca Kaufmann, MD  vitamin B-12 (CYANOCOBALAMIN) 500 MCG tablet Take 500 mcg by mouth daily.   Yes [provider]  vitamin C (ASCORBIC ACID) 500 MG tablet Take 1 tablet (500 mg total) by mouth daily. Take with iron for better absorption 12/10/18  Yes Dettinger, Fransisca Kaufmann, MD  diclofenac sodium (VOLTAREN) 1 % GEL Apply 2 g topically 4 (four) times daily. Patient not taking: Reported on 01/29/2019 07/13/18   Dettinger, Fransisca Kaufmann, MD    Physical Exam: Vitals:   01/29/19 1338 01/29/19 1430 01/29/19 1500 01/29/19 1530  BP: 132/76 129/71 131/76 137/77  Pulse: 90 87 86 88  Resp: 16 15 18 16   Temp:      TempSrc:      SpO2: 97% 98% 97% 96%    Constitutional: Appears weak, slightly drowsy but arouses to voice answers questions appropriately Vitals:   01/29/19 1338 01/29/19 1430 01/29/19 1500 01/29/19 1530  BP: 132/76 129/71 131/76 137/77  Pulse: 90 87 86 88  Resp: 16 15 18 16   Temp:      TempSrc:       SpO2: 97% 98% 97% 96%   Eyes: PERRL, lids and conjunctivae normal ENMT: Mucous membranes are moist. Posterior pharynx clear of any exudate or lesions. Neck: normal, supple, no masses, no thyromegaly Respiratory: clear to auscultation bilaterally, no wheezing, no crackles. Normal respiratory effort. No accessory muscle use.  Cardiovascular: Regular rate and rhythm, no murmurs / rubs / gallops. No extremity edema. 2+ pedal pulses. No carotid bruits.  Abdomen: no tenderness, no masses palpated. No hepatosplenomegaly. Bowel sounds positive.  Musculoskeletal: no clubbing / cyanosis. No joint deformity upper and lower extremities. Good ROM, no contractures. Normal muscle  tone.  Skin: no rashes, lesions, ulcers. No induration Neurologic: CN 2-12 grossly intact. Strength 5/5 in all 4.  Psychiatric: Normal judgment and insight. Alert and oriented x 3. Normal mood.   Labs on Admission: I have personally reviewed following labs and imaging studies  CBC: Recent Labs  Lab 01/29/19 1338  WBC 5.3  NEUTROABS 3.1  HGB 7.2*  HCT 23.7*  MCV 98.3  PLT 841   Basic Metabolic Panel: Recent Labs  Lab 01/29/19 1338  NA 137  K 3.8  CL 107  CO2 20*  GLUCOSE 108*  BUN 25*  CREATININE 0.82  CALCIUM 8.6*   GFR: CrCl cannot be calculated (Unknown ideal weight.). Liver Function Tests: Recent Labs  Lab 01/29/19 1338  AST 25  ALT 11  ALKPHOS 147*  BILITOT 0.5  PROT 7.4  ALBUMIN 3.8    Radiological Exams on Admission: No results found.  EKG: Independently reviewed.  Sinus rhythm.  QTc 469.  EKG unchanged from prior.  Assessment/Plan Active Problems:   Symptomatic anemia   Symptomatic anemia- Hgb 7.2, with generalized weakness.  History of erosive gastritis on EGD 2011.  Recent admission for similar with hemoglobin 5.2, 3 units PRBC transfused, and 2 units again in December.  Anemia panel does not suggest iron deficiency anemia, no history to suggest apparent GI blood loss.  Anemia  likely from prostate cancer infiltration of bone. -Transfuse 2 units -Stool FOBT -With frequent transfusions, will GI consult, consider ruling out GI pathology - NPO midnight -IV  Famotidine 20mg  BID  Prostate cancer with metastasis to bone-follows with Dr. Delton Coombes. Currently on [name] injections.  HTN- Stable.  Antihypertensives Norvasc recently discontinued   DVT prophylaxis: SCDS Code Status: Full Family Communication: None at bedside Disposition Plan: 1-2 days Consults called: GI Admission status: Obs, Tele    Bethena Roys MD [facility]  01/29/2019, 3:40 PM

## 2019-01-29 NOTE — ED Notes (Signed)
Hospitalist at bedside 

## 2019-01-30 DIAGNOSIS — C61 Malignant neoplasm of prostate: Secondary | ICD-10-CM | POA: Diagnosis not present

## 2019-01-30 DIAGNOSIS — C7951 Secondary malignant neoplasm of bone: Secondary | ICD-10-CM

## 2019-01-30 DIAGNOSIS — D649 Anemia, unspecified: Secondary | ICD-10-CM | POA: Diagnosis not present

## 2019-01-30 DIAGNOSIS — G894 Chronic pain syndrome: Secondary | ICD-10-CM

## 2019-01-30 DIAGNOSIS — D62 Acute posthemorrhagic anemia: Secondary | ICD-10-CM | POA: Diagnosis not present

## 2019-01-30 LAB — CBC
HCT: 28.3 % — ABNORMAL LOW (ref 39.0–52.0)
HEMOGLOBIN: 8.9 g/dL — AB (ref 13.0–17.0)
MCH: 29.4 pg (ref 26.0–34.0)
MCHC: 31.4 g/dL (ref 30.0–36.0)
MCV: 93.4 fL (ref 80.0–100.0)
Platelets: 156 10*3/uL (ref 150–400)
RBC: 3.03 MIL/uL — ABNORMAL LOW (ref 4.22–5.81)
RDW: 18.2 % — ABNORMAL HIGH (ref 11.5–15.5)
WBC: 6.5 10*3/uL (ref 4.0–10.5)
nRBC: 2.9 % — ABNORMAL HIGH (ref 0.0–0.2)

## 2019-01-30 LAB — TYPE AND SCREEN
ABO/RH(D): A NEG
Antibody Screen: NEGATIVE
Unit division: 0
Unit division: 0

## 2019-01-30 LAB — BPAM RBC
BLOOD PRODUCT EXPIRATION DATE: 202002272359
Blood Product Expiration Date: 202003122359
ISSUE DATE / TIME: 202002181556
ISSUE DATE / TIME: 202002181954
Unit Type and Rh: 600
Unit Type and Rh: 600

## 2019-01-30 LAB — HEMOGLOBIN AND HEMATOCRIT, BLOOD
HCT: 28 % — ABNORMAL LOW (ref 39.0–52.0)
Hemoglobin: 8.6 g/dL — ABNORMAL LOW (ref 13.0–17.0)

## 2019-01-30 MED ORDER — SODIUM CHLORIDE 0.9 % IV SOLN
INTRAVENOUS | Status: DC | PRN
  Administered 2019-01-30 (×2): 250 mL via INTRAVENOUS

## 2019-01-30 MED ORDER — TRAMADOL HCL 50 MG PO TABS
100.0000 mg | ORAL_TABLET | Freq: Three times a day (TID) | ORAL | Status: DC | PRN
  Administered 2019-01-30: 100 mg via ORAL
  Filled 2019-01-30: qty 2

## 2019-01-30 MED ORDER — DOCUSATE SODIUM 100 MG PO CAPS
100.0000 mg | ORAL_CAPSULE | Freq: Two times a day (BID) | ORAL | Status: DC
  Administered 2019-01-30: 100 mg via ORAL
  Filled 2019-01-30: qty 1

## 2019-01-30 MED ORDER — OXYCODONE HCL 5 MG PO TABS
10.0000 mg | ORAL_TABLET | Freq: Once | ORAL | Status: DC | PRN
  Filled 2019-01-30: qty 2

## 2019-01-30 NOTE — Discharge Summary (Signed)
Physician Discharge Summary  Walter Boone. XKG:[id number] DOB: 1951-10-12 DOA: 01/29/2019  PCP: Dettinger, Fransisca Kaufmann, MD  Admit date: 01/29/2019 Discharge date: 01/30/2019  Admitted From: home Disposition:  home  Recommendations for Outpatient Follow-up:  1. Follow up with PCP in 1-2 weeks 2. Please obtain BMP/CBC in one week 3. Follow up with oncology in 1 week  Discharge Condition: stable CODE STATUS:full code Diet recommendation: regular diet  Brief/Interim Summary: 68 year old male with a history of metastatic prostate cancer to bone, chronic pain syndrome, presents to the hospital with generalized weakness.  He was found to have significant anemia.  He was noted with a hemoglobin of 7.2.  ER physician discussed with the patient's oncologist who recommended admission for transfusion of 2 units of PRBC.  Patient was admitted to the hospital underwent transfusion with a any complications.  He was significantly improved after transfusion.  GI was consulted to evaluate possible GI loss of blood.  His rectal exam was negative for occult blood.  It was felt unlikely that he had significant blood loss from GI tract since he did not have any melena or hematochezia.  It was felt more probable that his anemia was related to his metastatic prostate cancer which infiltrated his bone marrow.  He will likely need frequent transfusions.  He has been advised to follow-up with oncology in the next 1 to 2 weeks for repeat CBC.  Patient is anxious to discharge home today.  He is feeling improved.  Discharge Diagnoses:  Active Problems:   Symptomatic anemia   Prostate cancer metastatic to bone [facility])   Chronic pain syndrome    Discharge Instructions  Discharge Instructions    Diet - low sodium heart healthy   Complete by:  As directed    Increase activity slowly   Complete by:  As directed      Allergies as of 01/30/2019   No Known Allergies     Medication List    TAKE these  medications   CALCIUM 1200+D3 PO Take 1 tablet by mouth daily.   Cholecalciferol 50 MCG (2000 UT) Chew Chew 1 tablet by mouth.   diclofenac sodium 1 % Gel Commonly known as:  VOLTAREN Apply 2 g topically 4 (four) times daily.   EQUATE PLUS Liqd Take 1 Can by mouth 2 (two) times daily.   ferrous sulfate 325 (65 FE) MG tablet Commonly known as:  FERROUSUL Take 1 tablet (325 mg total) by mouth daily with breakfast.   HYDROcodone-acetaminophen 10-325 MG tablet Commonly known as:  NORCO Take 1 tablet by mouth every 8 (eight) hours as needed. What changed:  reasons to take this   [name] IJ Inject 1 application as directed every 6 (six) months.   MUSCLE RUB 10-15 % Crea Apply 1 application topically daily as needed for muscle pain.   OVER THE COUNTER MEDICATION See admin instructions. preggie pop drops - take 1 lozenge as needed.   oxyCODONE-acetaminophen 10-325 MG tablet Commonly known as:  PERCOCET Take 1 tablet by mouth every 8 (eight) hours as needed for pain.   promethazine 25 MG tablet Commonly known as:  PHENERGAN Take 1 tablet (25 mg total) by mouth every 6 (six) hours as needed for nausea or vomiting.   traMADol 50 MG tablet Commonly known as:  ULTRAM Take 2 tablets (100 mg total) by mouth every 4 (four) hours as needed.   vitamin B-12 500 MCG tablet Commonly known as:  CYANOCOBALAMIN Take 500 mcg by mouth daily.  vitamin C 500 MG tablet Commonly known as:  ASCORBIC ACID Take 1 tablet (500 mg total) by mouth daily. Take with iron for better absorption   XGEVA 120 MG/1.7ML Soln injection Generic drug:  denosumab Inject 120 mg into the skin once.      Follow-up Information    [name], MD. Schedule an appointment as soon as possible for a visit in 1 week(s).   Specialty:  Hematology Contact information: [address] [place] [address] [phone]          No Known Allergies  Consultations:  GI   Procedures/Studies: No  results found.    Subjective: Feeling better after transfusions. More energy, no shortness of breath  Discharge Exam: Vitals:   01/29/19 2142 01/29/19 2320 01/30/19 0721 01/30/19 1312  BP:  134/75 135/83 135/75  Pulse:  87 86 84  Resp:  16 18 18   Temp:  99.4 F (37.4 C) 98.5 F (36.9 C) 98.5 F (36.9 C)  TempSrc:  Oral Oral Oral  SpO2: 95% 98% 98% 97%  Weight:      Height:        General: Pt is alert, awake, not in acute distress Cardiovascular: RRR, S1/S2 +, no rubs, no gallops Respiratory: CTA bilaterally, no wheezing, no rhonchi Abdominal: Soft, NT, ND, bowel sounds + Extremities: no edema, no cyanosis    The results of significant diagnostics from this hospitalization (including imaging, microbiology, ancillary and laboratory) are listed below for reference.     Microbiology: No results found for this or any previous visit (from the past 240 hour(s)).   Labs: BNP (last 3 results) No results for input(s): BNP in the last 8760 hours. Basic Metabolic Panel: Recent Labs  Lab 01/29/19 1338  NA 137  K 3.8  CL 107  CO2 20*  GLUCOSE 108*  BUN 25*  CREATININE 0.82  CALCIUM 8.6*   Liver Function Tests: Recent Labs  Lab 01/29/19 1338  AST 25  ALT 11  ALKPHOS 147*  BILITOT 0.5  PROT 7.4  ALBUMIN 3.8   No results for input(s): LIPASE, AMYLASE in the last 168 hours. No results for input(s): AMMONIA in the last 168 hours. CBC: Recent Labs  Lab 01/29/19 1338 01/30/19 0106 01/30/19 0707  WBC 5.3  --  6.5  NEUTROABS 3.1  --   --   HGB 7.2* 8.6* 8.9*  HCT 23.7* 28.0* 28.3*  MCV 98.3  --  93.4  PLT 176  --  156   Cardiac Enzymes: No results for input(s): CKTOTAL, CKMB, CKMBINDEX, TROPONINI in the last 168 hours. BNP: Invalid input(s): POCBNP CBG: No results for input(s): GLUCAP in the last 168 hours. D-Dimer No results for input(s): DDIMER in the last 72 hours. Hgb A1c No results for input(s): HGBA1C in the last 72 hours. Lipid Profile No  results for input(s): CHOL, HDL, LDLCALC, TRIG, CHOLHDL, LDLDIRECT in the last 72 hours. Thyroid function studies No results for input(s): TSH, T4TOTAL, T3FREE, THYROIDAB in the last 72 hours.  Invalid input(s): FREET3 Anemia work up No results for input(s): VITAMINB12, FOLATE, FERRITIN, TIBC, IRON, RETICCTPCT in the last 72 hours. Urinalysis    Component Value Date/Time   COLORURINE YELLOW 12/28/2018 [place] 12/28/2018 1237   LABSPEC 1.016 12/28/2018 [place] 7.0 12/28/2018 [place] 12/28/2018 1237   HGBUR NEGATIVE 12/28/2018 [place] 12/28/2018 [place] 12/28/2018 [place] 12/28/2018 1237  UROBILINOGEN 0.2 03/12/2014 1025   NITRITE NEGATIVE 12/28/2018 [place] 12/28/2018 1237   Sepsis Labs Invalid input(s): PROCALCITONIN,  WBC,  LACTICIDVEN Microbiology No results found for this or any previous visit (from the past 240 hour(s)).   Time coordinating discharge: 58mins  SIGNED:   Kathie Dike, MD  [facility] 01/30/2019, 6:17 PM   If 7PM-7AM, please contact night-coverage [email or web address]

## 2019-01-30 NOTE — Care Management Obs Status (Signed)
[place] NOTIFICATION   Patient Details  Name: Walter Boone. MRN: [id number] Date of Birth: 1951-06-22   Medicare Observation Status Notification Given:  Yes    Tommy Medal 01/30/2019, 3:38 PM

## 2019-01-30 NOTE — Progress Notes (Signed)
Patient's IV catheter removed and intact. Pt's IV site clean dry and intact. Discharge instructions including medications and follow up appointments were reviewed and discussed with patient. All questions were answered and no further questions at this time. Pt in stable condition and in no acute distress at this time. Pt will be escorted by nurse tech.

## 2019-01-30 NOTE — Progress Notes (Signed)
RN paged Dr. Myna Hidalgo to request some pain medication for patient as it is not time for Hydrocodone, awaiting response.  P.J. Linus Mako, RN

## 2019-01-30 NOTE — Consult Note (Signed)
Referring Provider: Kathie Dike, MD Primary Care Physician:  Dettinger, Fransisca Kaufmann, MD Primary Gastroenterologist:  Dr. Laural Golden  Reason for Consultation:    Anemia.  HPI:   Patient is 68 year old Hispanic male who has a history of metastatic prostate carcinoma which was diagnosed initially in 2005 and treated with various modalities starting with radiation therapy to begin with.  He now has developed evidence of bone mets.  Patient states he was hospitalized 4 weeks ago and received 3 units of PRBCs.  He had transfusion on two occasions prior to hospitalization each time he received received 2 or 3  units of PRBCs. Patient presented to emergency room yesterday afternoon with progressive weakness and he felt cold and tired.  He felt his hemoglobin had dropped again just like few weeks earlier.  His hemoglobin was 7.2 g.  Patient has received 2 units of PRBCs and his hemoglobin this morning was 8.9 g.  He had bone marrow aspiration and biopsy on 10/12/2018 revealing metastatic prostatic adenocarcinoma.  He had absence hematopoiesis. Gastroenterology consultation requested to determine if he would benefit from endoscopic evaluation.  Patient denies abdominal pain melena or rectal bleeding.  He says the only time his stool was black was when he was given iron pills.  On stopping iron pills his stools returned to normal color.  He has been constipated.  He has been taking women's laxative.  He has to take 3 pills at a time.  He denies nausea vomiting or hematemesis.  He also denies dysphagia.  He has back pain and he uses Aleve no more than couple of times a month.  He states he lost close to 70 pounds while he was on chemo which he stopped.  He says his appetite is has improved. He underwent EGD and colonoscopy in December 2011.  He was found to have H. pylori gastritis and was treated. Colonoscopy revealed single small tubular adenoma and 3 hyperplastic polyps.  He was also treated for diverticulitis at  that time.  Also had rectal telangiectasia/radiation proctitis without stigmata of bleeding.  He was supposed to have a follow-up colonoscopy in September 2013 but was canceled.   Past Medical History:  Diagnosis Date  .    .    . Arthritis   . Bony metastasis ([place])   . Chronic back pain   . Diverticulosis   . Family history of colon cancer   . Fatty liver   . Gynecomastia, male 01/11/2013   Secondary to prostate ca Tx.   . H/O Bell's palsy   . Hemorrhoids    history  . High cholesterol   . History of back injury 11/19/2013  . Hyperglycemia   . Hyperlipidemia   . Hypertension   . Prostate cancer ([place])   . Prostate cancer ([place]) 06/20/2011  . Prostate carcinoma, recurrent ([place]) 08/10/2012    Past Surgical History:  Procedure Laterality Date  . [place]   left  . COLONOSCOPY  2008  . CYSTOSCOPY WITH INSERTION OF UROLIFT N/A 10/16/2018   Procedure: CYSTOSCOPY WITH INSERTION OF UROLIFT;  Surgeon: Franchot Gallo, MD;  Location: [facility];  Service: Urology;  Laterality: N/A;  . ESOPHAGOGASTRODUODENOSCOPY    . HAND SURGERY     nerve was torn - left hand  . HERNIA REPAIR  2010  . I&D EXTREMITY Left 06/10/2015   Procedure: IRRIGATION AND DEBRIDEMENT EXTREMITY LEFT HAND EXPLORATION, nerve repair;  Surgeon: Charlotte Crumb, MD;  Location: [facility];  Service: Orthopedics;  Laterality: Left;  .  KNEE SURGERY     left  . PROSTATE BIOPSY  11/06    Prior to Admission medications   Medication Sig Start Date End Date Taking? Authorizing Provider  Calcium-Magnesium-Vitamin D (CALCIUM 1200+D3 PO) Take 1 tablet by mouth daily.    Yes [provider]  Cholecalciferol 50 MCG (2000 UT) CHEW Chew 1 tablet by mouth.   Yes [provider]  denosumab (XGEVA) 120 MG/1.7ML SOLN injection Inject 120 mg into the skin once.   Yes [provider]  ferrous sulfate (FERROUSUL) 325 (65 FE) MG tablet Take 1 tablet (325 mg total) by mouth daily with breakfast. 12/10/18  Yes  Dettinger, Fransisca Kaufmann, MD  HYDROcodone-acetaminophen (NORCO) 10-325 MG tablet Take 1 tablet by mouth every 8 (eight) hours as needed. Patient taking differently: Take 1 tablet by mouth every 8 (eight) hours as needed for severe pain.  12/10/18  Yes Dettinger, Fransisca Kaufmann, MD  Leuprolide Acetate ([name] IJ) Inject 1 application as directed every 6 (six) months.    Yes [provider]  Menthol-Methyl Salicylate (MUSCLE RUB) 10-15 % CREA Apply 1 application topically daily as needed for muscle pain.    Yes [provider]  Nutritional Supplements (EQUATE PLUS) LIQD Take 1 Can by mouth 2 (two) times daily. 12/10/18  Yes Dettinger, Fransisca Kaufmann, MD  OVER THE COUNTER MEDICATION See admin instructions. preggie pop drops - take 1 lozenge as needed.    Yes [provider]  oxyCODONE-acetaminophen (PERCOCET) 10-325 MG tablet Take 1 tablet by mouth every 8 (eight) hours as needed for pain. 01/11/19  Yes Dettinger, Fransisca Kaufmann, MD  promethazine (PHENERGAN) 25 MG tablet Take 1 tablet (25 mg total) by mouth every 6 (six) hours as needed for nausea or vomiting. 11/28/18  Yes Lockamy, Randi L, NP-C  traMADol (ULTRAM) 50 MG tablet Take 2 tablets (100 mg total) by mouth every 4 (four) hours as needed. 01/11/19  Yes Dettinger, Fransisca Kaufmann, MD  vitamin B-12 (CYANOCOBALAMIN) 500 MCG tablet Take 500 mcg by mouth daily.   Yes [provider]  vitamin C (ASCORBIC ACID) 500 MG tablet Take 1 tablet (500 mg total) by mouth daily. Take with iron for better absorption 12/10/18  Yes Dettinger, Fransisca Kaufmann, MD  diclofenac sodium (VOLTAREN) 1 % GEL Apply 2 g topically 4 (four) times daily. Patient not taking: Reported on 01/29/2019 07/13/18   Dettinger, Fransisca Kaufmann, MD    Current Facility-Administered Medications  Medication Dose Route Frequency Provider Last Rate Last Dose  . 0.9 %  sodium chloride infusion   Intravenous PRN Emokpae, Ejiroghene E, MD 10 mL/hr at 01/30/19 0956 250 mL at 01/30/19 0956  . acetaminophen  (TYLENOL) tablet 650 mg  650 mg Oral Q6H PRN Emokpae, Ejiroghene E, MD       Or  . acetaminophen (TYLENOL) suppository 650 mg  650 mg Rectal Q6H PRN Emokpae, Ejiroghene E, MD      . docusate sodium (COLACE) capsule 100 mg  100 mg Oral BID Kathie Dike, MD   100 mg at 01/30/19 1322  . famotidine (PEPCID) IVPB 20 mg premix  20 mg Intravenous Q12H Emokpae, Ejiroghene E, MD 100 mL/hr at 01/30/19 0956 20 mg at 01/30/19 0956  . ferrous sulfate tablet 325 mg  325 mg Oral Q breakfast Emokpae, Ejiroghene E, MD   325 mg at 01/30/19 0756  . HYDROcodone-acetaminophen (NORCO) 10-325 MG per tablet 1 tablet  1 tablet Oral Q8H PRN Emokpae, Ejiroghene E, MD   1 tablet at 01/30/19 0641  .  ondansetron (ZOFRAN) tablet 4 mg  4 mg Oral Q6H PRN Emokpae, Ejiroghene E, MD       Or  . ondansetron (ZOFRAN) injection 4 mg  4 mg Intravenous Q6H PRN Emokpae, Ejiroghene E, MD      . oxyCODONE (Oxy IR/ROXICODONE) immediate release tablet 10 mg  10 mg Oral Once PRN Opyd, Ilene Qua, MD      . polyethylene glycol (MIRALAX / GLYCOLAX) packet 17 g  17 g Oral Daily PRN Emokpae, Ejiroghene E, MD      . traMADol (ULTRAM) tablet 100 mg  100 mg Oral Q8H PRN Kathie Dike, MD   100 mg at 01/30/19 1113    Allergies as of 01/29/2019  . (No Known Allergies)    Family History  Problem Relation Age of Onset  . Heart failure Mother   . Diabetes Father   . Cancer Sister   . Diabetes Brother   . Cancer Sister        ? colon cancer; d. in her 50s  . Muscular dystrophy Sister   . Pneumonia Brother        d. 11 mo  . Lung cancer Nephew     Social History   Socioeconomic History  . Marital status: Married    Spouse name: Not on file  . Number of children: 4  . Years of education: Not on file  . Highest education level: GED or equivalent  Occupational History  . Occupation: Disabled  Social Needs  . Financial resource strain: Not very hard  . Food insecurity:    Worry: Sometimes true    Inability: Sometimes true  .  Transportation needs:    Medical: No    Non-medical: No  Tobacco Use  . Smoking status: Former Smoker    Packs/day: 2.50    Years: 3.00    Pack years: 7.50  . Smokeless tobacco: Never Used  Substance and Sexual Activity  . Alcohol use: No    Comment: former drinker 20 years ago  . Drug use: No  . Sexual activity: Not Currently  Lifestyle  . Physical activity:    Days per week: 0 days    Minutes per session: 0 min  . Stress: Not at all  Relationships  . Social connections:    Talks on phone: More than three times a week    Gets together: More than three times a week    Attends religious service: Not on file    Active member of club or organization: Not on file    Attends meetings of clubs or organizations: Not on file    Relationship status: Married  . Intimate partner violence:    Fear of current or ex partner: No    Emotionally abused: No    Physically abused: No    Forced sexual activity: No  Other Topics Concern  . Not on file  Social History Narrative  . Not on file    Review of Systems: See HPI, otherwise normal ROS  Physical Exam: Temp:  [98.2 F (36.8 C)-99.5 F (37.5 C)] 98.5 F (36.9 C) (02/19 1312) Pulse Rate:  [84-100] 84 (02/19 1312) Resp:  [16-20] 18 (02/19 1312) BP: (131-162)/(74-88) 135/75 (02/19 1312) SpO2:  [95 %-99 %] 97 % (02/19 1312) Weight:  [77.3 kg] 77.3 kg (02/18 1843) Last BM Date: 01/29/19 Patient is alert and in no acute distress. Conjunctiva is pale.  Sclerae nonicteric. Oral pharyngeal mucosa is normal. No neck adenopathy or thyromegaly noted. Neck exam with  regular rhythm normal S1 and S2.  No murmur or gallop noted. Auscultation of lungs reveal vesicular breath sounds bilaterally. Abdomen is symmetrical.  On palpation it is soft and nontender with organomegaly or masses. Rectal examination reveals no external abnormality.  Digital exam reveals soft but formed stool in the rectum and it is guaiac negative. No peripheral edema  or clubbing noted. Lab Results: Recent Labs    01/29/19 1338 01/30/19 0106 01/30/19 0707  WBC 5.3  --  6.5  HGB 7.2* 8.6* 8.9*  HCT 23.7* 28.0* 28.3*  PLT 176  --  156   BMET Recent Labs    01/29/19 1338  NA 137  K 3.8  CL 107  CO2 20*  GLUCOSE 108*  BUN 25*  CREATININE 0.82  CALCIUM 8.6*   LFT Recent Labs    01/29/19 1338  PROT 7.4  ALBUMIN 3.8  AST 25  ALT 11  ALKPHOS 147*  BILITOT 0.5   Iron studies from 09/06/2018 From iron 105, TIBC 303 and saturation 35%. Ferritin 615.  B12 1774 and folate 7.8 on 09/06/2018.  Assessment;  Patient is 68 year old male who presents with recurrent anemia with need for PRBC transfusion.  He has received 2 units of PRBCs during his admission and all in all he has received 10 units October 2019.  Bone marrow examination in November 2019 revealed metastatic prostatic carcinoma.  Iron studies were normal in September 2019.  Stool is guaiac negative today.  Is no history of melena or rectal bleeding. His anemia is not due to GI blood loss.  I am afraid his anemia is due to impaired hematopoiesis because of mets to bone marrow and he may also have shortened red cell lifespan. There is no indication for endoscopic evaluation at this time. Patient is high risk for CRC given family history of carcinoma and one sibling and the fact that he has had tubular adenoma before.  However given metastatic prostate carcinoma colonoscopy would not be recommended unless he has evidence of GI bleed.  Since condition discussed with several family members including his wife and their daughter who are at bedside.  Condition and recommendations discussed with Dr. Roderic Palau.  Recommendations;  Diet advanced to heart healthy diet. Patient stable for discharge from GI standpoint.    LOS: 0 days   Genasis Zingale  01/30/2019, 5:58 PM

## 2019-02-04 ENCOUNTER — Telehealth: Payer: Self-pay | Admitting: Family Medicine

## 2019-02-06 ENCOUNTER — Inpatient Hospital Stay (HOSPITAL_COMMUNITY): Payer: Medicare HMO | Attending: Hematology | Admitting: Hematology

## 2019-02-06 ENCOUNTER — Other Ambulatory Visit: Payer: Self-pay

## 2019-02-06 ENCOUNTER — Encounter (HOSPITAL_COMMUNITY): Payer: Self-pay | Admitting: Hematology

## 2019-02-06 VITALS — BP 128/68 | HR 78 | Temp 97.7°F | Resp 16 | Wt 160.4 lb

## 2019-02-06 DIAGNOSIS — M1990 Unspecified osteoarthritis, unspecified site: Secondary | ICD-10-CM | POA: Diagnosis not present

## 2019-02-06 DIAGNOSIS — Z800 Family history of malignant neoplasm of digestive organs: Secondary | ICD-10-CM | POA: Diagnosis not present

## 2019-02-06 DIAGNOSIS — R110 Nausea: Secondary | ICD-10-CM | POA: Diagnosis not present

## 2019-02-06 DIAGNOSIS — C7951 Secondary malignant neoplasm of bone: Secondary | ICD-10-CM | POA: Insufficient documentation

## 2019-02-06 DIAGNOSIS — Z79818 Long term (current) use of other agents affecting estrogen receptors and estrogen levels: Secondary | ICD-10-CM | POA: Diagnosis not present

## 2019-02-06 DIAGNOSIS — F1011 Alcohol abuse, in remission: Secondary | ICD-10-CM | POA: Insufficient documentation

## 2019-02-06 DIAGNOSIS — Z791 Long term (current) use of non-steroidal anti-inflammatories (NSAID): Secondary | ICD-10-CM | POA: Insufficient documentation

## 2019-02-06 DIAGNOSIS — Z79899 Other long term (current) drug therapy: Secondary | ICD-10-CM | POA: Insufficient documentation

## 2019-02-06 DIAGNOSIS — M8580 Other specified disorders of bone density and structure, unspecified site: Secondary | ICD-10-CM | POA: Diagnosis not present

## 2019-02-06 DIAGNOSIS — Z923 Personal history of irradiation: Secondary | ICD-10-CM | POA: Diagnosis not present

## 2019-02-06 DIAGNOSIS — Z9221 Personal history of antineoplastic chemotherapy: Secondary | ICD-10-CM | POA: Insufficient documentation

## 2019-02-06 DIAGNOSIS — E785 Hyperlipidemia, unspecified: Secondary | ICD-10-CM

## 2019-02-06 DIAGNOSIS — Z87891 Personal history of nicotine dependence: Secondary | ICD-10-CM | POA: Insufficient documentation

## 2019-02-06 DIAGNOSIS — I10 Essential (primary) hypertension: Secondary | ICD-10-CM | POA: Insufficient documentation

## 2019-02-06 DIAGNOSIS — D649 Anemia, unspecified: Secondary | ICD-10-CM

## 2019-02-06 DIAGNOSIS — C61 Malignant neoplasm of prostate: Secondary | ICD-10-CM | POA: Diagnosis not present

## 2019-02-06 DIAGNOSIS — K219 Gastro-esophageal reflux disease without esophagitis: Secondary | ICD-10-CM | POA: Insufficient documentation

## 2019-02-06 DIAGNOSIS — R5383 Other fatigue: Secondary | ICD-10-CM | POA: Diagnosis not present

## 2019-02-06 MED ORDER — DENOSUMAB 120 MG/1.7ML ~~LOC~~ SOLN
120.0000 mg | Freq: Once | SUBCUTANEOUS | Status: DC
  Filled 2019-02-06: qty 1.7

## 2019-02-06 MED ORDER — LEUPROLIDE ACETATE (3 MONTH) 22.5 MG IM KIT
22.5000 mg | PACK | Freq: Once | INTRAMUSCULAR | Status: AC
  Administered 2019-02-06: 22.5 mg via INTRAMUSCULAR

## 2019-02-06 MED ORDER — APALUTAMIDE 60 MG PO TABS: 240.0000 mg | ORAL_TABLET | Freq: Every day | ORAL | 1 refills | Status: AC

## 2019-02-06 MED ORDER — OXYCODONE-ACETAMINOPHEN 10-325 MG PO TABS: 1.0000 | ORAL_TABLET | Freq: Every evening | ORAL | 0 refills | Status: DC | PRN

## 2019-02-06 NOTE — Progress Notes (Signed)
Walter Boone, [address] [address]   CLINIC:  Medical Oncology/Hematology  PCP:  Boone, Walter Kaufmann, MD [place] [address] [phone]   REASON FOR VISIT: Follow-up for metastatic prostate Boone  CURRENT THERAPY: patient stopped therapy  BRIEF ONCOLOGIC HISTORY:    Primary prostate Boone with metastasis from prostate to other site Walter Boone)   Boone Initial Diagnosis    Prostate Boone ([place])    01/12/2016 Imaging    Bone density- This patient is considered osteopenic by World Healh Organization (WHO) Criteria.     03/16/2016 Imaging    CT CAP- No evidence of metastatic disease within the chest, abdomen, or pelvis. No other acute findings identified.    06/17/2016 Imaging    Bone scan- 1. Negative for metastatic pattern.    07/11/2016 Miscellaneous    Prolia every 6 months for osteopenia.    09/12/2016 -  Chemotherapy    Casodex/Depo-[name] every 3 months     09/27/2016 Imaging    CT CAP -No acute findings and no evidence for metastatic disease within the chest, abdomen or pelvis.    11/13/2017 Progression    Bone Scan: Multiple new abnormal sites of increased osseous tracer accumulation are identified as above consistent with osseous metastatic disease.  These include new foci of uptake at the proximal femora bilaterally.    11/16/2017 Progression    CT CAP-IMPRESSION: 1. New enlarged right pelvic sidewall lymph node and borderline left periaortic lymph node worrisome for metastatic adenopathy. 2. Subtle areas of sclerosis within the lumbar spine are more conspicuous than on the previous exam and worrisome for metastatic disease. Overall the areas of bone metastasis are more better demonstrated on bone scan from 11/13/2017 which demonstrate a progression of bone metastasis.      Boone STAGING: Boone Staging Primary prostate Boone with metastasis from prostate to other site Walter Boone) Staging form: Prostate,  AJCC 7th Edition - Clinical: Stage IIC (pT2c, N0, M0) - Signed by Walter Boone    INTERVAL HISTORY:  Walter Boone 68 y.o. male returns for routine follow-up for metastatic prostate Boone. He is here today with his wife. He has been in and out of the hospital for blood transfusions for the past two months. He never went for his [name] injection in October. He stopped the oral medication due to severe fatigue, weakness, and nausea. Denies any vomiting or diarrhea. Denies any new pains. Had not noticed any recent bleeding such as epistaxis, hematuria or hematochezia. Denies recent chest pain on exertion, shortness of breath on minimal exertion, pre-syncopal episodes, or palpitations. Denies any numbness or tingling in hands or feet. Denies any recent fevers, infections, or recent hospitalizations. Patient reports appetite at 25% and energy level at 0%.   REVIEW OF SYSTEMS:  Review of Systems  Constitutional: Positive for fatigue.  Gastrointestinal: Positive for constipation and nausea.  Neurological: Positive for extremity weakness.  All other systems reviewed and are negative.    PAST MEDICAL/SURGICAL HISTORY:  Past Medical History:  Diagnosis Date  . Acid reflux   . Alcoholism ([place])   . Arthritis   . Bony metastasis ([place])   . Chronic back pain   . Diverticulosis   . Family history of colon Boone   . Fatty liver   . Gynecomastia, male 01/11/2013   Secondary to prostate ca Tx.   . H/O Bell's palsy   . Hemorrhoids    history  . High cholesterol   .  History of back injury 11/19/2013  . Hyperglycemia   . Hyperlipidemia   . Hypertension   . Prostate Boone ([place])   . Prostate Boone ([place]) Boone  . Prostate carcinoma, recurrent ([place]) 08/10/2012   Past Surgical History:  Procedure Laterality Date  . [place]   left  . COLONOSCOPY  2008  . CYSTOSCOPY WITH INSERTION OF UROLIFT N/A 10/16/2018   Procedure: CYSTOSCOPY WITH INSERTION OF UROLIFT;   Surgeon: Franchot Gallo, MD;  Location: [facility];  Service: Urology;  Laterality: N/A;  . ESOPHAGOGASTRODUODENOSCOPY    . HAND SURGERY     nerve was torn - left hand  . HERNIA REPAIR  2010  . I&D EXTREMITY Left 06/10/2015   Procedure: IRRIGATION AND DEBRIDEMENT EXTREMITY LEFT HAND EXPLORATION, nerve repair;  Surgeon: Charlotte Crumb, MD;  Location: [place];  Service: Orthopedics;  Laterality: Left;  . KNEE SURGERY     left  . PROSTATE BIOPSY  11/06     SOCIAL HISTORY:  Social History   Socioeconomic History  . Marital status: Married    Spouse name: Not on file  . Number of children: 4  . Years of education: Not on file  . Highest education level: GED or equivalent  Occupational History  . Occupation: Disabled  Social Needs  . Financial resource strain: Not very hard  . Food insecurity:    Worry: Sometimes true    Inability: Sometimes true  . Transportation needs:    Medical: No    Non-medical: No  Tobacco Use  . Smoking status: Former Smoker    Packs/day: 2.50    Years: 3.00    Pack years: 7.50  . Smokeless tobacco: Never Used  Substance and Sexual Activity  . Alcohol use: No    Comment: former drinker 20 years ago  . Drug use: No  . Sexual activity: Not Currently  Lifestyle  . Physical activity:    Days per week: 0 days    Minutes per session: 0 min  . Stress: Not at all  Relationships  . Social connections:    Talks on phone: More than three times a week    Gets together: More than three times a week    Attends religious service: Not on file    Active member of club or organization: Not on file    Attends meetings of clubs or organizations: Not on file    Relationship status: Married  . Intimate partner violence:    Fear of current or ex partner: No    Emotionally abused: No    Physically abused: No    Forced sexual activity: No  Other Topics Concern  . Not on file  Social History Narrative  . Not on file    FAMILY HISTORY:  Family History    Problem Relation Age of Onset  . Heart failure Mother   . Diabetes Father   . Boone Sister   . Diabetes Brother   . Boone Sister        ? colon Boone; d. in her 36s  . Muscular dystrophy Sister   . Pneumonia Brother        d. 11 mo  . Lung Boone Nephew     CURRENT MEDICATIONS:  Outpatient Encounter Medications as of 02/06/2019  Medication Sig Note  . Calcium-Magnesium-Vitamin D (CALCIUM 1200+D3 PO) Take 1 tablet by mouth daily.    . Cholecalciferol 50 MCG (2000 UT) CHEW Chew 1 tablet by mouth.   . denosumab (XGEVA)  120 MG/1.7ML SOLN injection Inject 120 mg into the skin once. 12/28/2018: Next shot due 01/13/2019  . diclofenac sodium (VOLTAREN) 1 % GEL Apply 2 g topically 4 (four) times daily.   . ferrous sulfate (FERROUSUL) 325 (65 FE) MG tablet Take 1 tablet (325 mg total) by mouth daily with breakfast.   . HYDROcodone-acetaminophen (NORCO) 10-325 MG tablet Take 1 tablet by mouth every 8 (eight) hours as needed. (Patient taking differently: Take 1 tablet by mouth every 8 (eight) hours as needed for severe pain. )   . Menthol-Methyl Salicylate (MUSCLE RUB) 10-15 % CREA Apply 1 application topically daily as needed for muscle pain.    . Nutritional Supplements (EQUATE PLUS) LIQD Take 1 Can by mouth 2 (two) times daily.   Marland Kitchen OVER THE COUNTER MEDICATION See admin instructions. preggie pop drops - take 1 lozenge as needed.    Marland Kitchen oxyCODONE-acetaminophen (PERCOCET) 10-325 MG tablet Take 1 tablet by mouth at bedtime as needed for pain.   . promethazine (PHENERGAN) 25 MG tablet Take 1 tablet (25 mg total) by mouth every 6 (six) hours as needed for nausea or vomiting.   . traMADol (ULTRAM) 50 MG tablet Take 2 tablets (100 mg total) by mouth every 4 (four) hours as needed.   . vitamin B-12 (CYANOCOBALAMIN) 500 MCG tablet Take 500 mcg by mouth daily.   . vitamin C (ASCORBIC ACID) 500 MG tablet Take 1 tablet (500 mg total) by mouth daily. Take with iron for better absorption   . [DISCONTINUED]  oxyCODONE-acetaminophen (PERCOCET) 10-325 MG tablet Take 1 tablet by mouth every 8 (eight) hours as needed for pain.   Marland Kitchen apalutamide (ERLEADA) 60 MG tablet Take 4 tablets (240 mg total) by mouth daily. May be taken with or without food. Swallow tablets whole.   Marland Kitchen Leuprolide Acetate ([name] IJ) Inject 1 application as directed every 6 (six) months.     Facility-Administered Encounter Medications as of 02/06/2019  Medication  . denosumab (XGEVA) injection 120 mg  . [COMPLETED] leuprolide ([name]) injection 22.5 mg    ALLERGIES:  No Known Allergies   PHYSICAL EXAM:  ECOG Performance status: 1  Vitals:   02/06/19 1447  BP: 128/68  Pulse: 78  Resp: 16  Temp: 97.7 F (36.5 C)  SpO2: 99%   Filed Weights   02/06/19 1447  Weight: 160 lb 6.4 oz (72.8 kg)    Physical Exam Constitutional:      Appearance: Normal appearance. He is normal weight.  Cardiovascular:     Rate and Rhythm: Normal rate and regular rhythm.     Heart sounds: Normal heart sounds.  Pulmonary:     Effort: Pulmonary effort is normal.     Breath sounds: Normal breath sounds.  Musculoskeletal: Normal range of motion.  Skin:    General: Skin is warm and dry.  Neurological:     Mental Status: He is alert and oriented to person, place, and time. Mental status is at baseline.  Psychiatric:        Mood and Affect: Mood normal.        Behavior: Behavior normal.        Thought Content: Thought content normal.        Judgment: Judgment normal.      LABORATORY DATA:  I have reviewed the labs as listed.  CBC    Component Value Date/Time   WBC 6.5 01/30/2019 0707   RBC 3.03 (L) 01/30/2019 0707   HGB 8.9 (L) 01/30/2019 0707   HGB 8.5 (  LL) 01/11/2019 1437   HCT 28.3 (L) 01/30/2019 0707   HCT 24.4 (L) 01/11/2019 1437   PLT 156 01/30/2019 0707   PLT 201 01/11/2019 1437   MCV 93.4 01/30/2019 0707   MCV 88 01/11/2019 1437   MCH 29.4 01/30/2019 0707   MCHC 31.4 01/30/2019 0707   RDW 18.2 (H) 01/30/2019 0707    RDW 17.1 (H) 01/11/2019 1437   LYMPHSABS 1.1 01/29/2019 1338   LYMPHSABS 1.3 01/11/2019 1437   MONOABS 0.6 01/29/2019 1338   EOSABS 0.1 01/29/2019 1338   EOSABS 0.1 01/11/2019 1437   BASOSABS 0.0 01/29/2019 1338   BASOSABS 0.0 01/11/2019 1437   CMP Latest Ref Rng & Units 01/29/2019 01/04/2019 12/29/2018  Glucose 70 - 99 mg/dL 108(H) 132(H) 146(H)  BUN 8 - 23 mg/dL 25(H) 16 19  Creatinine 0.61 - 1.24 mg/dL 0.82 0.74(L) 0.81  Sodium 135 - 145 mmol/L 137 141 137  Potassium 3.5 - 5.1 mmol/L 3.8 4.3 4.1  Chloride 98 - 111 mmol/L 107 107(H) 107  CO2 22 - 32 mmol/L 20(L) 20 22  Calcium 8.9 - 10.3 mg/dL 8.6(L) 8.1(L) 7.6(L)  Total Protein 6.5 - 8.1 g/dL 7.4 - -  Total Bilirubin 0.3 - 1.2 mg/dL 0.5 - -  Alkaline Phos 38 - 126 U/L 147(H) - -  AST 15 - 41 U/L 25 - -  ALT 0 - 44 U/L 11 - -       DIAGNOSTIC IMAGING:  I have independently reviewed the scans and discussed with the patient.   I have reviewed Francene Finders, NP's note and agree with the documentation.  I personally performed a face-to-face visit, made revisions and my assessment and plan is as follows.    ASSESSMENT & PLAN:   Primary prostate Boone with metastasis from prostate to other site [facility] Boone) 1.  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 - Zytiga and prednisone started in January 2019. -Last bone scan and CT scan on 04/27/2018 shows worsening bone metastasis with no visceral metastasis. - At last visit in July I have recommended him to go back on Abiraterone.  He started back at lower dose of 500 mg daily and discontinued it in August secondary to intolerance from nausea and severe tiredness. -Bone marrow biopsy on 10/11/2018 consistent with metastatic adenocarcinoma infiltration. -UroLift procedure was done on  10/16/2018 which is helping his urine flow.  He does not have to self catheterize anymore. -Bone marrow biopsy on 10/11/2018 shows complete infiltration by prostate Boone cells. -Enzalutamide was started on 10/22/2018, increased to 4 tablets daily, self discontinued in January 2020. - He apparently self discontinued enzalutamide in January 2020 as he is not tolerating it well.  His main complaint was feeling of nausea all the time.  He tried taking Compazine but did not help. -He was hospitalized from 12/28/2018 through 12/29/2018 and received 3 units of blood transfusion for hemoglobin of 5.2. - He was hospitalized again on 01/29/2019 through 01/30/2019 for hemoglobin of 7.2 and received 2 units of PRBC.  Hemoglobin at discharge was 8.9 last week. - His PSA came down from 254 (09/25/2018) to 153 (12/28/2018). - He also did not receive [name] recently.  His last [name] 22.5 mg was on 06/26/2018.  I have talked to him about the importance of continuing [name] injections indefinitely.  We will get him  back on [name] today. - We have talked about other options including chemotherapy.  Patient is reluctant to consider chemotherapy.  I have suggested him an alternative of apalutamide 240 mg which is FDA approved for castration resistant prostate Boone.  We talked about the side effects in detail.  He will start out with 3 tablets daily for 1 week followed by 4 tablets daily with or without food. -I will reevaluate him in 2 weeks and repeat his blood work.  I will also get a PSA at the time.  2.  Bone metastasis: - He was started back on Xgeva on 11/15/2018.  Last dose was on 12/13/2018.  Will resume in [place]. -She will continue calcium and vitamin D supplements.   3.  Family history: - Sister died of metastatic Boone.  Primary is not known to the patient.  Another sister died of colon Boone. -He met with Roma Kayser and genetic testing blood work was sent.  We are awaiting results.    Total time spent is  40 minutes more than 50% of time spent discussing his diagnosis, prognosis, need for adherence to treatment and coordination of care.  Orders placed this encounter:  Orders Placed This Encounter  Procedures  . CBC with Differential/Platelet  . Comprehensive metabolic panel  . PSA  . CBC with Differential/Platelet  . Comprehensive metabolic panel  . [place], [place] [phone]

## 2019-02-06 NOTE — Assessment & Plan Note (Signed)
1.  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 - Zytiga and prednisone started in January 2019. -Last bone scan and CT scan on 04/27/2018 shows worsening bone metastasis with no visceral metastasis. - At last visit in July I have recommended him to go back on Abiraterone.  He started back at lower dose of 500 mg daily and discontinued it in August secondary to intolerance from nausea and severe tiredness. -Bone marrow biopsy on 10/11/2018 consistent with metastatic adenocarcinoma infiltration. -UroLift procedure was done on 10/16/2018 which is helping his urine flow.  He does not have to self catheterize anymore. -Bone marrow biopsy on 10/11/2018 shows complete infiltration by prostate cancer cells. -Enzalutamide was started on 10/22/2018, increased to 4 tablets daily, self discontinued in January 2020. - He apparently self discontinued enzalutamide in January 2020 as he is not tolerating it well.  His main complaint was feeling of nausea all the time.  He tried taking Compazine but did not help. -He was hospitalized from 12/28/2018 through 12/29/2018 and received 3 units of blood transfusion for hemoglobin of 5.2. - He was hospitalized again on 01/29/2019 through 01/30/2019 for hemoglobin of 7.2 and received 2 units of PRBC.  Hemoglobin at discharge was 8.9 last week. - His PSA came down from 254 (09/25/2018) to 153 (12/28/2018). - He also did not receive [name] recently.  His last [name] 22.5 mg was on 06/26/2018.  I have talked to him about the importance of continuing [name] injections indefinitely.  We will get him back on [name] today. - We have talked about other options including chemotherapy.  Patient is reluctant to consider chemotherapy.  I have suggested him an alternative of apalutamide 240  mg which is FDA approved for castration resistant prostate cancer.  We talked about the side effects in detail.  He will start out with 3 tablets daily for 1 week followed by 4 tablets daily with or without food. -I will reevaluate him in 2 weeks and repeat his blood work.  I will also get a PSA at the time.  2.  Bone metastasis: - He was started back on Xgeva on 11/15/2018.  Last dose was on 12/13/2018.  Will resume in [place]. -She will continue calcium and vitamin D supplements.   3.  Family history: - Sister died of metastatic cancer.  Primary is not known to the patient.  Another sister died of colon cancer. -He met with Roma Kayser and genetic testing blood work was sent.  We are awaiting results.

## 2019-02-06 NOTE — Patient Instructions (Signed)
[facility] at [facility] Discharge Instructions     Thank you for choosing [facility] at [facility] to provide your oncology and hematology care.  To afford each patient quality time with our provider, please arrive at least 15 minutes before your scheduled appointment time.   If you have a lab appointment with the Cancer Center please come in thru the  Main Entrance and check in at the main information desk  You need to re-schedule your appointment should you arrive 10 or more minutes late.  We strive to give you quality time with our providers, and arriving late affects you and other patients whose appointments are after yours.  Also, if you no show three or more times for appointments you may be dismissed from the clinic at the providers discretion.     Again, thank you for choosing [facility].  Our hope is that these requests will decrease the amount of time that you wait before being seen by our physicians.       _____________________________________________________________  Should you have questions after your visit to [facility], please contact our office at [phone] between the hours of 8:00 a.m. and 4:30 p.m.  Voicemails left after 4:00 p.m. will not be returned until the following business day.  For prescription refill requests, have your pharmacy contact our office and allow 72 hours.    Cancer Center Support Programs:   > Cancer Support Group  2nd Tuesday of the month 1pm-2pm, Journey Room    

## 2019-02-07 ENCOUNTER — Telehealth: Payer: Self-pay | Admitting: Pharmacy Technician

## 2019-02-07 ENCOUNTER — Telehealth (HOSPITAL_COMMUNITY): Payer: Self-pay | Admitting: Pharmacist

## 2019-02-07 NOTE — Telephone Encounter (Signed)
Oral Oncology Pharmacist Encounter  Received new prescription for Erleada (apalutamdie) for the treatment of metastatic castration refractory prostate cancer in conjunction with [name], planned duration until disease progression or unacceptable drug toxicity.  Prescription dose and frequency assessed. Per provider instruction, patient will start out with 3 tablets daily for 1 week followed by 4 tablets daily with or without food.  Current medication list in Epic reviewed, a few DDIs with Erleada identified: - Erleada may may decrease the serum concentration of oxycodone-APAP, hydrocodone/APAP, and tramadol. Monitor patient for decreased pain control. No baseline dose adjustment needed.    Prescription has been e-scribed to the [facility] for benefits analysis and approval.  Oral Oncology Clinic will continue to follow for insurance authorization, copayment issues, initial counseling and start date.  Darl Pikes, PharmD, BCPS, [facility] Hematology/Oncology Clinical Pharmacist [facility] [place] [facility] [phone]  02/07/2019 11:15 AM

## 2019-02-08 NOTE — Telephone Encounter (Signed)
Scheduled Erleada to be shipped Monday 3/2 for delivery 3/3.

## 2019-02-08 NOTE — Telephone Encounter (Signed)
Oral Oncology Patient Advocate Encounter  Received notification from [facility] that prior authorization for Walter Boone is required.  PA submitted on CoverMyMeds Key [id number] Status is pending  Oral Oncology Clinic will continue to follow.  [name] Patient [place] Phone [phone] Fax [phone] 02/08/2019 8:15 AM

## 2019-02-08 NOTE — Telephone Encounter (Addendum)
Oral Chemotherapy Pharmacist Encounter  Patient Education I spoke with patient for overview of new oral chemotherapy medication: Erleada (apalutamdie) for the treatment of metastatic castration refractory prostate cancer in conjunction with [name], planned duration until disease progression or unacceptable drug toxicity.  Counseled patient on administration, dosing, side effects, monitoring, drug-food interactions, safe handling, storage, and disposal. Patient will take 3 tablets daily for 1 week followed by 4 tablets daily with or without food.  Side effects include but not limited to: decrease in wbc/hgb, fatigue.    Reviewed with patient importance of keeping a medication schedule and plan for any missed doses.  Walter Boone voiced understanding and appreciation. All questions answered. Medication handout placed in the mail.  Provided patient with Oral [place] phone number. Patient knows to call the office with questions or concerns. Oral Chemotherapy Navigation Clinic will continue to follow.  Darl Pikes, PharmD, BCPS, [facility] Hematology/Oncology Clinical Pharmacist [facility] [place] [facility] [phone]  02/08/2019 2:15 PM

## 2019-02-08 NOTE — Telephone Encounter (Signed)
Oral Oncology Patient Advocate Encounter  Prior Authorization for [place] has been approved.    PA# [id number] Effective dates: 02/07/2019 through 08/08/2019  Patients co-pay is $0.00.  Oral Oncology Clinic will continue to follow.   [place] Patient [place] Phone [phone] Fax [phone] 02/08/2019 8:16 AM

## 2019-02-11 NOTE — Telephone Encounter (Signed)
Pharmacy ran rx today and it rejected for patient not covered, eligibility ended 02/09/2019.  I called [employer] to see what the issue was and they stated that the patient must call about the issue.  I called Mr Tomasetti and told him of the issue and to call [employer].  He will call me after talking to insurance to let me know what is going on.

## 2019-02-13 NOTE — Telephone Encounter (Signed)
Called [employer] to check eligibility.  Per the automated line patient is no longer covered by [employer].  I called the patient and asked if he had re-enrolled in a medicare plan in November of last year and he could not remember.  I advised him to call [facility] and speak with some to see what he needs to do.

## 2019-02-14 ENCOUNTER — Inpatient Hospital Stay (HOSPITAL_COMMUNITY): Payer: Medicare Other | Attending: Hematology

## 2019-02-14 ENCOUNTER — Other Ambulatory Visit (HOSPITAL_COMMUNITY): Payer: Self-pay | Admitting: Nurse Practitioner

## 2019-02-14 ENCOUNTER — Other Ambulatory Visit: Payer: Self-pay

## 2019-02-14 DIAGNOSIS — G893 Neoplasm related pain (acute) (chronic): Secondary | ICD-10-CM | POA: Diagnosis not present

## 2019-02-14 DIAGNOSIS — Z9181 History of falling: Secondary | ICD-10-CM | POA: Diagnosis not present

## 2019-02-14 DIAGNOSIS — K760 Fatty (change of) liver, not elsewhere classified: Secondary | ICD-10-CM | POA: Insufficient documentation

## 2019-02-14 DIAGNOSIS — R112 Nausea with vomiting, unspecified: Secondary | ICD-10-CM | POA: Diagnosis not present

## 2019-02-14 DIAGNOSIS — Z79899 Other long term (current) drug therapy: Secondary | ICD-10-CM | POA: Diagnosis not present

## 2019-02-14 DIAGNOSIS — C7951 Secondary malignant neoplasm of bone: Principal | ICD-10-CM

## 2019-02-14 DIAGNOSIS — R51 Headache: Secondary | ICD-10-CM | POA: Insufficient documentation

## 2019-02-14 DIAGNOSIS — R269 Unspecified abnormalities of gait and mobility: Secondary | ICD-10-CM | POA: Insufficient documentation

## 2019-02-14 DIAGNOSIS — F1011 Alcohol abuse, in remission: Secondary | ICD-10-CM | POA: Insufficient documentation

## 2019-02-14 DIAGNOSIS — M8580 Other specified disorders of bone density and structure, unspecified site: Secondary | ICD-10-CM | POA: Diagnosis not present

## 2019-02-14 DIAGNOSIS — I10 Essential (primary) hypertension: Secondary | ICD-10-CM | POA: Insufficient documentation

## 2019-02-14 DIAGNOSIS — C61 Malignant neoplasm of prostate: Secondary | ICD-10-CM | POA: Diagnosis present

## 2019-02-14 DIAGNOSIS — G479 Sleep disorder, unspecified: Secondary | ICD-10-CM | POA: Diagnosis not present

## 2019-02-14 DIAGNOSIS — R200 Anesthesia of skin: Secondary | ICD-10-CM | POA: Diagnosis not present

## 2019-02-14 DIAGNOSIS — Z79818 Long term (current) use of other agents affecting estrogen receptors and estrogen levels: Secondary | ICD-10-CM | POA: Insufficient documentation

## 2019-02-14 DIAGNOSIS — R5383 Other fatigue: Secondary | ICD-10-CM | POA: Diagnosis not present

## 2019-02-14 DIAGNOSIS — M549 Dorsalgia, unspecified: Secondary | ICD-10-CM | POA: Insufficient documentation

## 2019-02-14 DIAGNOSIS — R42 Dizziness and giddiness: Secondary | ICD-10-CM | POA: Diagnosis not present

## 2019-02-14 DIAGNOSIS — E785 Hyperlipidemia, unspecified: Secondary | ICD-10-CM | POA: Diagnosis not present

## 2019-02-14 DIAGNOSIS — M1990 Unspecified osteoarthritis, unspecified site: Secondary | ICD-10-CM | POA: Diagnosis not present

## 2019-02-14 DIAGNOSIS — G8929 Other chronic pain: Secondary | ICD-10-CM | POA: Diagnosis not present

## 2019-02-14 DIAGNOSIS — Z800 Family history of malignant neoplasm of digestive organs: Secondary | ICD-10-CM | POA: Insufficient documentation

## 2019-02-14 DIAGNOSIS — K5900 Constipation, unspecified: Secondary | ICD-10-CM | POA: Diagnosis not present

## 2019-02-14 DIAGNOSIS — Z87891 Personal history of nicotine dependence: Secondary | ICD-10-CM | POA: Diagnosis not present

## 2019-02-14 DIAGNOSIS — Z9221 Personal history of antineoplastic chemotherapy: Secondary | ICD-10-CM | POA: Diagnosis not present

## 2019-02-14 DIAGNOSIS — M546 Pain in thoracic spine: Secondary | ICD-10-CM | POA: Diagnosis not present

## 2019-02-14 LAB — COMPREHENSIVE METABOLIC PANEL
ALBUMIN: 4.1 g/dL (ref 3.5–5.0)
ALT: 13 U/L (ref 0–44)
ANION GAP: 10 (ref 5–15)
AST: 30 U/L (ref 15–41)
Alkaline Phosphatase: 154 U/L — ABNORMAL HIGH (ref 38–126)
BILIRUBIN TOTAL: 0.5 mg/dL (ref 0.3–1.2)
BUN: 29 mg/dL — ABNORMAL HIGH (ref 8–23)
CO2: 21 mmol/L — ABNORMAL LOW (ref 22–32)
Calcium: 8.7 mg/dL — ABNORMAL LOW (ref 8.9–10.3)
Chloride: 106 mmol/L (ref 98–111)
Creatinine, Ser: 0.95 mg/dL (ref 0.61–1.24)
GFR calc Af Amer: >60 mL/min (ref >60)
GFR calc non Af Amer: >60 mL/min (ref >60)
Glucose, Bld: 127 mg/dL — ABNORMAL HIGH (ref 70–99)
Potassium: 3.9 mmol/L (ref 3.5–5.1)
Sodium: 137 mmol/L (ref 135–145)
Total Protein: 7.4 g/dL (ref 6.5–8.1)

## 2019-02-14 LAB — CBC WITH DIFFERENTIAL/PLATELET
Abs Immature Granulocytes: 0.33 10*3/uL — ABNORMAL HIGH (ref 0.00–0.07)
Basophils Absolute: 0 10*3/uL (ref 0.0–0.1)
Basophils Relative: 1 %
Eosinophils Absolute: 0.1 10*3/uL (ref 0.0–0.5)
Eosinophils Relative: 1 %
HCT: 27.2 % — ABNORMAL LOW (ref 39.0–52.0)
HEMOGLOBIN: 8.3 g/dL — AB (ref 13.0–17.0)
Immature Granulocytes: 7 %
Lymphocytes Relative: 25 %
Lymphs Abs: 1.2 10*3/uL (ref 0.7–4.0)
MCH: 29.5 pg (ref 26.0–34.0)
MCHC: 30.5 g/dL (ref 30.0–36.0)
MCV: 96.8 fL (ref 80.0–100.0)
Monocytes Absolute: 0.5 10*3/uL (ref 0.1–1.0)
Monocytes Relative: 10 %
Neutro Abs: 2.7 10*3/uL (ref 1.7–7.7)
Neutrophils Relative %: 56 %
Platelets: 125 10*3/uL — ABNORMAL LOW (ref 150–400)
RBC: 2.81 MIL/uL — ABNORMAL LOW (ref 4.22–5.81)
RDW: 17.2 % — ABNORMAL HIGH (ref 11.5–15.5)
WBC: 4.7 10*3/uL (ref 4.0–10.5)
nRBC: 1.3 % — ABNORMAL HIGH (ref 0.0–0.2)

## 2019-02-14 LAB — SAMPLE TO BLOOD BANK

## 2019-02-14 NOTE — Telephone Encounter (Signed)
Patient has a Medicare rep coming by his house at [place] tomorrow to try and re-instate his insurance.  He is going to come by [place] [place] Friday to pick up a sample of Erleada and sign manufacturer assistance application in case re-instatement of insurance takes extra time.

## 2019-02-15 ENCOUNTER — Telehealth (HOSPITAL_COMMUNITY): Payer: Self-pay | Admitting: Pharmacist

## 2019-02-15 ENCOUNTER — Telehealth (HOSPITAL_COMMUNITY): Payer: Self-pay

## 2019-02-15 ENCOUNTER — Encounter (HOSPITAL_COMMUNITY): Payer: Self-pay

## 2019-02-15 ENCOUNTER — Encounter: Payer: Self-pay | Admitting: Genetic Counselor

## 2019-02-15 ENCOUNTER — Telehealth: Payer: Self-pay | Admitting: Genetic Counselor

## 2019-02-15 ENCOUNTER — Ambulatory Visit: Payer: Self-pay | Admitting: Genetic Counselor

## 2019-02-15 DIAGNOSIS — Z1379 Encounter for other screening for genetic and chromosomal anomalies: Secondary | ICD-10-CM | POA: Insufficient documentation

## 2019-02-15 DIAGNOSIS — C61 Malignant neoplasm of prostate: Secondary | ICD-10-CM

## 2019-02-15 NOTE — Telephone Encounter (Signed)
Nutrition Follow-up:  Patient with prostate cancer with mets to bone.  Patient planning to start apalutamdie for treatment of metastatic prostate cancer.    Called patient for nutrition follow-up.  Patient reports that he is not eating well.  Reports that he can just look at food and his stomach turns and feels nauseated.  Reports this is the biggest barrier to him eating more.  Reports that he is out of his nausea medication but has been taking some pill that pregnant ladies take for nausea.  Reports they are not helping either.  Has just eaten a bowl of cream of wheat today.  Ate a little bit of chicken and dumplings last night for dinner and some cereal.  Reports that he is able to drink oral nutrition supplements.  Likes the chocolate.      Medications: reviewed  Labs: reviewed  Anthropometrics:   Weight 160 lb decreased from 178 lb in Jan 2020   NUTRITION DIAGNOSIS: Inadequate oral intake continues   INTERVENTION:  Encouraged patient to discuss nausea with MD.  Patient planning to follow-up with MD regarding medication for nausea. Encouraged small frequent snacks/meals Encouraged oral nutrition supplements.  Gathered some chocolate shakes and will leave for patient to pick up on 3/10 at next appointment Contact information given and knows to contact RD when needed. RD not in clinic on days when patient has appointments    MONITORING, EVALUATION, GOAL: weight trends, intake   NEXT VISIT: as needed  Jaxxson Cavanah B. Zenia Resides, [place], [place] Registered Dietitian [phone] (pager)

## 2019-02-15 NOTE — Telephone Encounter (Signed)
Revealed negative genetic testing.  Discussed that we do not know why he has prostate cancer or why there is cancer in the family. It could be due to a different gene that we are not testing, or maybe our current technology may not be able to pick something up.  It will be important for him to keep in contact with genetics to keep up with whether additional testing may be needed.   

## 2019-02-15 NOTE — Telephone Encounter (Signed)
Oral Chemotherapy Pharmacist Encounter  Dispensed samples to patient: Patient lost his insurance. Providing patient with samples while he is working to [employer] and while we apply for manufacturer assistance. Samples will be left in infusion pharmacy with Alvester Chou, PharmD.  Medication: Erleada (apalutamide) 60mg  tablets Instructions: Take 3 tablets by mouth daily for 1 week then increase to 4 tablets daily with or without food. Quantity dispensed: 56 tablets Days supply: 15 days Manufacturer: [employer] Overall Lot: [id number] Exp: 11/11/2019  Darl Pikes, PharmD, BCPS, [facility] H[facility]ology/On[facility] Clinical Pharmacist [facility] [phone]  02/15/2019 9:15 AM

## 2019-02-15 NOTE — Progress Notes (Signed)
HPI:  Walter Boone was previously seen in the [place] clinic due to a personal and family history of cancer and concerns regarding a hereditary predisposition to cancer. Please refer to our prior cancer genetics clinic note for more information regarding our discussion, assessment and recommendations, at the time. Walter Boone recent genetic test results were disclosed to him, as were recommendations warranted by these results. These results and recommendations are discussed in more detail below.  CANCER HISTORY:    Primary prostate cancer with metastasis from prostate to other site Walter Boone)   06/20/2011 Initial Diagnosis    Prostate cancer ([place])    01/12/2016 Imaging    Bone density- This patient is considered osteopenic by World Healh Organization (WHO) Criteria.     03/16/2016 Imaging    CT CAP- No evidence of metastatic disease within the chest, abdomen, or pelvis. No other acute findings identified.    06/17/2016 Imaging    Bone scan- 1. Negative for metastatic pattern.    07/11/2016 Miscellaneous    Prolia every 6 months for osteopenia.    09/12/2016 -  Chemotherapy    Casodex/Depo-[name] every 3 months     09/27/2016 Imaging    CT CAP -No acute findings and no evidence for metastatic disease within the chest, abdomen or pelvis.    11/13/2017 Progression    Bone Scan: Multiple new abnormal sites of increased osseous tracer accumulation are identified as above consistent with osseous metastatic disease.  These include new foci of uptake at the proximal femora bilaterally.    11/16/2017 Progression    CT CAP-IMPRESSION: 1. New enlarged right pelvic sidewall lymph node and borderline left periaortic lymph node worrisome for metastatic adenopathy. 2. Subtle areas of sclerosis within the lumbar spine are more conspicuous than on the previous exam and worrisome for metastatic disease. Overall the areas of bone metastasis are more better demonstrated on bone scan  from 11/13/2017 which demonstrate a progression of bone metastasis.    02/08/2019 Genetic Testing    Negative genetic testing on the common hereditary cancer panel.  The Hereditary Gene Panel offered by [facility] includes sequencing and/or deletion duplication testing of the following 47 genes: APC, ATM, AXIN2, BARD1, BMPR1A, BRCA1, BRCA2, BRIP1, CDH1, CDK4, CDKN2A (p14ARF), CDKN2A (p16INK4a), CHEK2, CTNNA1, DICER1, EPCAM (Deletion/duplication testing only), GREM1 (promoter region deletion/duplication testing only), KIT, MEN1, MLH1, MSH2, MSH3, MSH6, MUTYH, NBN, NF1, NHTL1, PALB2, PDGFRA, PMS2, POLD1, POLE, PTEN, RAD50, RAD51C, RAD51D, SDHB, SDHC, SDHD, SMAD4, SMARCA4. STK11, TP53, TSC1, TSC2, and VHL.  The following genes were evaluated for sequence changes only: SDHA and HOXB13 c.251G>A variant only. The report date is February 08, 2019.     FAMILY HISTORY:  We obtained a detailed, 4-generation family history.  Significant diagnoses are listed below: Family History  Problem Relation Age of Onset  . Heart failure Mother   . Diabetes Father   . Cancer Sister   . Diabetes Brother   . Cancer Sister        ? colon cancer; d. in her 62s  . Muscular dystrophy Sister   . Pneumonia Brother        d. 11 mo  . Lung cancer Nephew     The patient has three sons and a daughter who are all cancer free.  He has nine sisters and two brothers.  One sister died of possibly colon cancer in her 33's and another sister may also have colon cancer.  A third sister died of muscular dystrophy, and a  fourth sister has a son with lung cancer.  Both parents are deceased from old age.  The patient's mother had one brother who died in childhood.  Her parents are deceased from non cancer related issues.  The patient's father had five brothers and two sisters.  None had cancer, but he has a cousin with an unknown form of cancer.  There is no other reported family history of cancer.  Walter Boone is unaware of  previous family history of genetic testing for hereditary cancer risks. Patient's maternal ancestors are of [place] descent, and paternal ancestors are of [place] descent. There is no reported Ashkenazi Jewish ancestry. There is no known consanguinity.  GENETIC TEST RESULTS: Genetic testing reported out on February 08, 2019 through the common hereditary cancer panel found no pathogenic mutations. The Hereditary Gene Panel offered by [facility] includes sequencing and/or deletion duplication testing of the following 47 genes: APC, ATM, AXIN2, BARD1, BMPR1A, BRCA1, BRCA2, BRIP1, CDH1, CDK4, CDKN2A (p14ARF), CDKN2A (p16INK4a), CHEK2, CTNNA1, DICER1, EPCAM (Deletion/duplication testing only), GREM1 (promoter region deletion/duplication testing only), KIT, MEN1, MLH1, MSH2, MSH3, MSH6, MUTYH, NBN, NF1, NHTL1, PALB2, PDGFRA, PMS2, POLD1, POLE, PTEN, RAD50, RAD51C, RAD51D, SDHB, SDHC, SDHD, SMAD4, SMARCA4. STK11, TP53, TSC1, TSC2, and VHL.  The following genes were evaluated for sequence changes only: SDHA and HOXB13 c.251G>A variant only. The test report has been scanned into EP[employer] and is located under the Molecular Pathology section of the Results Review tab.  A portion of the result report is included below for reference.    We discussed with Walter Boone that because current genetic testing is not perfect, it is possible there may be a gene mutation in one of these genes that current testing cannot detect, but that chance is small.  We also discussed, that there could be another gene that has not yet been discovered, or that we have not yet tested, that is responsible for the cancer diagnoses in the family. It is also possible there is a hereditary cause for the cancer in the family that Walter Boone did not inherit and therefore was not identified in his testing.  Therefore, it is important to remain in touch with cancer genetics in the future so that we can continue to offer Walter Boone the most up to date genetic  testing.   ADDITIONAL GENETIC TESTING: We discussed with Walter Boone that there are other genes that are associated with increased cancer risk that can be analyzed. Should Walter Boone wish to pursue additional genetic testing, we are happy to discuss and coordinate this testing, at any time.    We discussed with Mr. Vecchio that his genetic testing was fairly extensive.  If there are genes identified to increase cancer risk that can be analyzed in the future, we would be happy to discuss and coordinate this testing at that time.    CANCER SCREENING RECOMMENDATIONS: Mr. Toya test result is considered negative (normal).  This means that we have not identified a hereditary cause for his personal and family history of cancer at this time. Most cancers happen by chance and this negative test suggests that his cancer may fall into this category.    While reassuring, this does not definitively rule out a hereditary predisposition to cancer. It is still possible that there could be genetic mutations that are undetectable by current technology. There could be genetic mutations in genes that have not been tested or identified to increase cancer risk.  Therefore, it is recommended he continue to follow  the cancer management and screening guidelines provided by his oncology and primary healthcare provider.   An individual's cancer risk and medical management are not determined by genetic test results alone. Overall cancer risk assessment incorporates additional factors, including personal medical history, family history, and any available genetic information that may result in a personalized plan for cancer prevention and surveillance  RECOMMENDATIONS FOR FAMILY MEMBERS:  Individuals in this family might be at some increased risk of developing cancer, over the general population risk, simply due to the family history of cancer.  We recommended women in this family have a yearly mammogram beginning at age  78, or 70 years younger than the earliest onset of cancer, an annual clinical breast exam, and perform monthly breast self-exams. Women in this family should also have a gynecological exam as recommended by their primary provider. All family members should have a colonoscopy by age 40.  FOLLOW-UP: Lastly, we discussed with Mr. Hippe that cancer genetics is a rapidly advancing field and it is possible that new genetic tests will be appropriate for him and/or his family members in the future. We encouraged him to remain in contact with cancer genetics on an annual basis so we can update his personal and family histories and let him know of advances in cancer genetics that may benefit this family.   Our contact number was provided. Mr. Epple questions were answered to his satisfaction, and he knows he is welcome to call us at anytime with additional questions or concerns.   Roma Kayser, MS, [facility] Certified Genetic Counselor [email or web address]

## 2019-02-18 NOTE — Telephone Encounter (Signed)
Ran a test claim for Erleada.  Looks like patients insurance will be active starting 03/13/2019.  Will call patient to verify this is correct and schedule medication for delivery 4/2.

## 2019-02-19 ENCOUNTER — Inpatient Hospital Stay (HOSPITAL_COMMUNITY): Payer: Medicare Other

## 2019-02-19 ENCOUNTER — Other Ambulatory Visit (HOSPITAL_COMMUNITY): Payer: Self-pay | Admitting: Nurse Practitioner

## 2019-02-19 ENCOUNTER — Inpatient Hospital Stay (HOSPITAL_BASED_OUTPATIENT_CLINIC_OR_DEPARTMENT_OTHER): Payer: Medicare Other | Admitting: Hematology

## 2019-02-19 ENCOUNTER — Other Ambulatory Visit: Payer: Self-pay

## 2019-02-19 ENCOUNTER — Encounter (HOSPITAL_COMMUNITY): Payer: Self-pay | Admitting: Hematology

## 2019-02-19 ENCOUNTER — Ambulatory Visit (HOSPITAL_COMMUNITY)
Admission: RE | Admit: 2019-02-19 | Discharge: 2019-02-19 | Disposition: A | Discharge status: Home / Self Care | Payer: Medicare Other | Source: Ambulatory Visit | Attending: Nurse Practitioner | Admitting: Nurse Practitioner

## 2019-02-19 VITALS — BP 137/66 | HR 88 | Temp 98.9°F | Resp 16 | Wt 157.0 lb

## 2019-02-19 DIAGNOSIS — Z800 Family history of malignant neoplasm of digestive organs: Secondary | ICD-10-CM

## 2019-02-19 DIAGNOSIS — M549 Dorsalgia, unspecified: Secondary | ICD-10-CM

## 2019-02-19 DIAGNOSIS — R5383 Other fatigue: Secondary | ICD-10-CM

## 2019-02-19 DIAGNOSIS — G479 Sleep disorder, unspecified: Secondary | ICD-10-CM

## 2019-02-19 DIAGNOSIS — G893 Neoplasm related pain (acute) (chronic): Secondary | ICD-10-CM

## 2019-02-19 DIAGNOSIS — F1011 Alcohol abuse, in remission: Secondary | ICD-10-CM

## 2019-02-19 DIAGNOSIS — K5900 Constipation, unspecified: Secondary | ICD-10-CM

## 2019-02-19 DIAGNOSIS — R200 Anesthesia of skin: Secondary | ICD-10-CM

## 2019-02-19 DIAGNOSIS — E785 Hyperlipidemia, unspecified: Secondary | ICD-10-CM

## 2019-02-19 DIAGNOSIS — Z9221 Personal history of antineoplastic chemotherapy: Secondary | ICD-10-CM | POA: Diagnosis not present

## 2019-02-19 DIAGNOSIS — Z87891 Personal history of nicotine dependence: Secondary | ICD-10-CM

## 2019-02-19 DIAGNOSIS — Z79899 Other long term (current) drug therapy: Secondary | ICD-10-CM

## 2019-02-19 DIAGNOSIS — M8580 Other specified disorders of bone density and structure, unspecified site: Secondary | ICD-10-CM

## 2019-02-19 DIAGNOSIS — R112 Nausea with vomiting, unspecified: Secondary | ICD-10-CM

## 2019-02-19 DIAGNOSIS — G8929 Other chronic pain: Secondary | ICD-10-CM

## 2019-02-19 DIAGNOSIS — C61 Malignant neoplasm of prostate: Secondary | ICD-10-CM | POA: Diagnosis not present

## 2019-02-19 DIAGNOSIS — D649 Anemia, unspecified: Secondary | ICD-10-CM

## 2019-02-19 DIAGNOSIS — C7951 Secondary malignant neoplasm of bone: Secondary | ICD-10-CM

## 2019-02-19 LAB — CBC WITH DIFFERENTIAL/PLATELET
Abs Immature Granulocytes: 0.33 10*3/uL — ABNORMAL HIGH (ref 0.00–0.07)
Basophils Absolute: 0 10*3/uL (ref 0.0–0.1)
Basophils Relative: 1 %
EOS ABS: 0.1 10*3/uL (ref 0.0–0.5)
Eosinophils Relative: 1 %
HEMATOCRIT: 24.7 % — AB (ref 39.0–52.0)
Hemoglobin: 7.8 g/dL — ABNORMAL LOW (ref 13.0–17.0)
Immature Granulocytes: 7 %
LYMPHS ABS: 1.3 10*3/uL (ref 0.7–4.0)
Lymphocytes Relative: 25 %
MCH: 30.7 pg (ref 26.0–34.0)
MCHC: 31.6 g/dL (ref 30.0–36.0)
MCV: 97.2 fL (ref 80.0–100.0)
Monocytes Absolute: 0.5 10*3/uL (ref 0.1–1.0)
Monocytes Relative: 10 %
Neutro Abs: 2.8 10*3/uL (ref 1.7–7.7)
Neutrophils Relative %: 56 %
Platelets: 126 10*3/uL — ABNORMAL LOW (ref 150–400)
RBC: 2.54 MIL/uL — ABNORMAL LOW (ref 4.22–5.81)
RDW: 17.2 % — AB (ref 11.5–15.5)
WBC: 5 10*3/uL (ref 4.0–10.5)
nRBC: 2 % — ABNORMAL HIGH (ref 0.0–0.2)

## 2019-02-19 LAB — PREPARE RBC (CROSSMATCH)

## 2019-02-19 LAB — COMPREHENSIVE METABOLIC PANEL
ALT: 11 U/L (ref 0–44)
AST: 32 U/L (ref 15–41)
Albumin: 3.9 g/dL (ref 3.5–5.0)
Alkaline Phosphatase: 142 U/L — ABNORMAL HIGH (ref 38–126)
Anion gap: 8 (ref 5–15)
BUN: 29 mg/dL — ABNORMAL HIGH (ref 8–23)
CO2: 24 mmol/L (ref 22–32)
Calcium: 9.1 mg/dL (ref 8.9–10.3)
Chloride: 104 mmol/L (ref 98–111)
Creatinine, Ser: 0.92 mg/dL (ref 0.61–1.24)
GFR calc Af Amer: >60 mL/min (ref >60)
GFR calc non Af Amer: >60 mL/min (ref >60)
GLUCOSE: 128 mg/dL — AB (ref 70–99)
POTASSIUM: 4.6 mmol/L (ref 3.5–5.1)
Sodium: 136 mmol/L (ref 135–145)
Total Bilirubin: 0.5 mg/dL (ref 0.3–1.2)
Total Protein: 7.2 g/dL (ref 6.5–8.1)

## 2019-02-19 LAB — PSA: Prostatic Specific Antigen: 321 ng/mL — ABNORMAL HIGH (ref 0.00–4.00)

## 2019-02-19 MED ORDER — SCOPOLAMINE 1 MG/3DAYS TD PT72
1.0000 | MEDICATED_PATCH | Freq: Once | TRANSDERMAL | Status: DC
  Administered 2019-02-19: 1.5 mg via TRANSDERMAL
  Filled 2019-02-19: qty 1

## 2019-02-19 MED ORDER — GADOBUTROL 1 MMOL/ML IV SOLN: 7.0000 mL | Freq: Once | INTRAVENOUS | Status: DC | PRN

## 2019-02-19 MED ORDER — OXYCODONE-ACETAMINOPHEN 10-325 MG PO TABS: 1.0000 | ORAL_TABLET | Freq: Four times a day (QID) | ORAL | 0 refills | Status: DC | PRN

## 2019-02-19 NOTE — Progress Notes (Signed)
Walter Boone, [place] [address]   CLINIC:  Medical Oncology/Hematology  PCP:  Dettinger, Fransisca Kaufmann, MD [place] [address] [phone]   REASON FOR VISIT:  Follow-up for metastatic prostate cancer  CURRENT THERAPY:[name] and apalutamide.    BRIEF ONCOLOGIC HISTORY:    Primary prostate cancer with metastasis from prostate to other site [facility])   06/20/2011 Initial Diagnosis    Prostate cancer ([place])    01/12/2016 Imaging    Bone density- This patient is considered osteopenic by World Healh Organization (WHO) Criteria.     03/16/2016 Imaging    CT CAP- No evidence of metastatic disease within the chest, abdomen, or pelvis. No other acute findings identified.    06/17/2016 Imaging    Bone scan- 1. Negative for metastatic pattern.    07/11/2016 Miscellaneous    Prolia every 6 months for osteopenia.    09/12/2016 -  Chemotherapy    Casodex/Depo-[name] every 3 months     09/27/2016 Imaging    CT CAP -No acute findings and no evidence for metastatic disease within the chest, abdomen or pelvis.    11/13/2017 Progression    Bone Scan: Multiple new abnormal sites of increased osseous tracer accumulation are identified as above consistent with osseous metastatic disease.  These include new foci of uptake at the proximal femora bilaterally.    11/16/2017 Progression    CT CAP-IMPRESSION: 1. New enlarged right pelvic sidewall lymph node and borderline left periaortic lymph node worrisome for metastatic adenopathy. 2. Subtle areas of sclerosis within the lumbar spine are more conspicuous than on the previous exam and worrisome for metastatic disease. Overall the areas of bone metastasis are more better demonstrated on bone scan from 11/13/2017 which demonstrate a progression of bone metastasis.    02/08/2019 Genetic Testing    Negative genetic testing on the common hereditary cancer panel.  The Hereditary Gene Panel offered by  [facility] includes sequencing and/or deletion duplication testing of the following 47 genes: APC, ATM, AXIN2, BARD1, BMPR1A, BRCA1, BRCA2, BRIP1, CDH1, CDK4, CDKN2A (p14ARF), CDKN2A (p16INK4a), CHEK2, CTNNA1, DICER1, EPCAM (Deletion/duplication testing only), GREM1 (promoter region deletion/duplication testing only), KIT, MEN1, MLH1, MSH2, MSH3, MSH6, MUTYH, NBN, NF1, NHTL1, PALB2, PDGFRA, PMS2, POLD1, POLE, PTEN, RAD50, RAD51C, RAD51D, SDHB, SDHC, SDHD, SMAD4, SMARCA4. STK11, TP53, TSC1, TSC2, and VHL.  The following genes were evaluated for sequence changes only: SDHA and HOXB13 c.251G>A variant only. The report date is February 08, 2019.      CANCER STAGING: Cancer Staging Primary prostate cancer with metastasis from prostate to other site [facility]) Staging form: Prostate, AJCC 7th Edition - Clinical: Stage IIC (pT2c, N0, M0) - Signed by Baird Cancer, PA on 06/20/2011    INTERVAL HISTORY:  Walter Boone 68 y.o. male returns for routine follow-up of metastatic prostate cancer. He states that he is having bad pain and it's worse at night. His pain medication is helping at times. He states that his lip and jaw/chin area is numb. He states that he has constipation and nausea at times. He states that he is using Ensure at home once or twice a day. Denies any diarrhea.  Had not noticed any recent bleeding such as epistaxis, hematuria or hematochezia. Denies recent chest pain on exertion, shortness of breath on minimal exertion, pre-syncopal episodes, or palpitations. Denies any numbness or tingling in hands or feet. Denies any recent fevers, infections, or recent hospitalizations. Patient reports appetite at 0% and energy level at  0%.   REVIEW OF SYSTEMS:  Review of Systems  Constitutional: Positive for fatigue.  Gastrointestinal: Positive for constipation, nausea and vomiting.  Genitourinary: Positive for frequency.   Musculoskeletal: Positive for back pain.  Psychiatric/Behavioral: Positive for  sleep disturbance.     PAST MEDICAL/SURGICAL HISTORY:  Past Medical History:  Diagnosis Date  . Acid reflux   . Alcoholism ([place])   . Arthritis   . Bony metastasis ([place])   . Chronic back pain   . Diverticulosis   . Family history of colon cancer   . Fatty liver   . Gynecomastia, male 01/11/2013   Secondary to prostate ca Tx.   . H/O Bell's palsy   . Hemorrhoids    history  . High cholesterol   . History of back injury 11/19/2013  . Hyperglycemia   . Hyperlipidemia   . Hypertension   . Prostate cancer ([place])   . Prostate cancer ([place]) 06/20/2011  . Prostate carcinoma, recurrent ([place]) 08/10/2012   Past Surgical History:  Procedure Laterality Date  . Tool   left  . COLONOSCOPY  2008  . CYSTOSCOPY WITH INSERTION OF UROLIFT N/A 10/16/2018   Procedure: CYSTOSCOPY WITH INSERTION OF UROLIFT;  Surgeon: Franchot Gallo, MD;  Location: [facility];  Service: Urology;  Laterality: N/A;  . ESOPHAGOGASTRODUODENOSCOPY    . HAND SURGERY     nerve was torn - left hand  . HERNIA REPAIR  2010  . I&D EXTREMITY Left 06/10/2015   Procedure: IRRIGATION AND DEBRIDEMENT EXTREMITY LEFT HAND EXPLORATION, nerve repair;  Surgeon: Charlotte Crumb, MD;  Location: [place];  Service: Orthopedics;  Laterality: Left;  . KNEE SURGERY     left  . PROSTATE BIOPSY  11/06     SOCIAL HISTORY:  Social History   Socioeconomic History  . Marital status: Married    Spouse name: Not on file  . Number of children: 4  . Years of education: Not on file  . Highest education level: GED or equivalent  Occupational History  . Occupation: Disabled  Social Needs  . Financial resource strain: Not very hard  . Food insecurity:    Worry: Sometimes true    Inability: Sometimes true  . Transportation needs:    Medical: No    Non-medical: No  Tobacco Use  . Smoking status: Former Smoker    Packs/day: 2.50    Years: 3.00    Pack years: 7.50  . Smokeless tobacco: Never Used  Substance and Sexual Activity   . Alcohol use: No    Comment: former drinker 20 years ago  . Drug use: No  . Sexual activity: Not Currently  Lifestyle  . Physical activity:    Days per week: 0 days    Minutes per session: 0 min  . Stress: Not at all  Relationships  . Social connections:    Talks on phone: More than three times a week    Gets together: More than three times a week    Attends religious service: Not on file    Active member of club or organization: Not on file    Attends meetings of clubs or organizations: Not on file    Relationship status: Married  . Intimate partner violence:    Fear of current or ex partner: No    Emotionally abused: No    Physically abused: No    Forced sexual activity: No  Other Topics Concern  . Not on file  Social History Narrative  . Not  on file    FAMILY HISTORY:  Family History  Problem Relation Age of Onset  . Heart failure Mother   . Diabetes Father   . Cancer Sister   . Diabetes Brother   . Cancer Sister        ? colon cancer; d. in her 45s  . Muscular dystrophy Sister   . Pneumonia Brother        d. 11 mo  . Lung cancer Nephew     CURRENT MEDICATIONS:  Outpatient Encounter Medications as of 02/19/2019  Medication Sig Note  . Calcium-Magnesium-Vitamin D (CALCIUM 1200+D3 PO) Take 1 tablet by mouth daily.    . Cholecalciferol 50 MCG (2000 UT) CHEW Chew 1 tablet by mouth.   . denosumab (XGEVA) 120 MG/1.7ML SOLN injection Inject 120 mg into the skin once. 12/28/2018: Next shot due 01/13/2019  . diclofenac sodium (VOLTAREN) 1 % GEL Apply 2 g topically 4 (four) times daily.   Marland Kitchen Leuprolide Acetate ([name] IJ) Inject 1 application as directed every 6 (six) months.    . Menthol-Methyl Salicylate (MUSCLE RUB) 10-15 % CREA Apply 1 application topically daily as needed for muscle pain.    . Nutritional Supplements (EQUATE PLUS) LIQD Take 1 Can by mouth 2 (two) times daily.   Marland Kitchen OVER THE COUNTER MEDICATION See admin instructions. preggie pop drops - take 1 lozenge  as needed.    Marland Kitchen oxyCODONE-acetaminophen (PERCOCET) 10-325 MG tablet Take 1 tablet by mouth at bedtime as needed for pain.   . promethazine (PHENERGAN) 25 MG tablet Take 1 tablet (25 mg total) by mouth every 6 (six) hours as needed for nausea or vomiting.   . traMADol (ULTRAM) 50 MG tablet Take 2 tablets (100 mg total) by mouth every 4 (four) hours as needed.   Marland Kitchen apalutamide (ERLEADA) 60 MG tablet Take 4 tablets (240 mg total) by mouth daily. May be taken with or without food. Swallow tablets whole. (Patient not taking: Reported on 02/19/2019)   . ferrous sulfate (FERROUSUL) 325 (65 FE) MG tablet Take 1 tablet (325 mg total) by mouth daily with breakfast. (Patient not taking: Reported on 02/19/2019)   . HYDROcodone-acetaminophen (NORCO) 10-325 MG tablet Take 1 tablet by mouth every 8 (eight) hours as needed. (Patient not taking: Reported on 02/19/2019)   . [DISCONTINUED] vitamin B-12 (CYANOCOBALAMIN) 500 MCG tablet Take 500 mcg by mouth daily.   . [DISCONTINUED] vitamin C (ASCORBIC ACID) 500 MG tablet Take 1 tablet (500 mg total) by mouth daily. Take with iron for better absorption (Patient not taking: Reported on 02/19/2019)    Facility-Administered Encounter Medications as of 02/19/2019  Medication  . scopolamine (TRANSDERM-SCOP) 1 MG/3DAYS 1.5 mg    ALLERGIES:  No Known Allergies   PHYSICAL EXAM:  ECOG Performance status: 2  Vitals:   02/19/19 1355  BP: 137/66  Pulse: 88  Resp: 16  Temp: 98.9 F (37.2 C)  SpO2: 100%   Filed Weights   02/19/19 1355  Weight: 157 lb (71.2 kg)    Physical Exam Constitutional:      Appearance: Normal appearance.  Cardiovascular:     Rate and Rhythm: Normal rate and regular rhythm.  Pulmonary:     Effort: Pulmonary effort is normal.     Breath sounds: Normal breath sounds.  Abdominal:     General: Bowel sounds are normal.     Palpations: Abdomen is soft.  Musculoskeletal:        General: No swelling.  Skin:    General: Skin is  warm.    Neurological:     Mental Status: He is alert and oriented to person, place, and time.  Psychiatric:        Mood and Affect: Mood normal.        Behavior: Behavior normal.      LABORATORY DATA:  I have reviewed the labs as listed.  CBC    Component Value Date/Time   WBC 5.0 02/19/2019 1330   RBC 2.54 (L) 02/19/2019 1330   HGB 7.8 (L) 02/19/2019 1330   HGB 8.5 (LL) 01/11/2019 1437   HCT 24.7 (L) 02/19/2019 1330   HCT 24.4 (L) 01/11/2019 1437   PLT 126 (L) 02/19/2019 1330   PLT 201 01/11/2019 1437   MCV 97.2 02/19/2019 1330   MCV 88 01/11/2019 1437   MCH 30.7 02/19/2019 1330   MCHC 31.6 02/19/2019 1330   RDW 17.2 (H) 02/19/2019 1330   RDW 17.1 (H) 01/11/2019 1437   LYMPHSABS 1.3 02/19/2019 1330   LYMPHSABS 1.3 01/11/2019 1437   MONOABS 0.5 02/19/2019 1330   EOSABS 0.1 02/19/2019 1330   EOSABS 0.1 01/11/2019 1437   BASOSABS 0.0 02/19/2019 1330   BASOSABS 0.0 01/11/2019 1437   CMP Latest Ref Rng & Units 02/19/2019 02/14/2019 01/29/2019  Glucose 70 - 99 mg/dL 128(H) 127(H) 108(H)  BUN 8 - 23 mg/dL 29(H) 29(H) 25(H)  Creatinine 0.61 - 1.24 mg/dL 0.92 0.95 0.82  Sodium 135 - 145 mmol/L 136 137 137  Potassium 3.5 - 5.1 mmol/L 4.6 3.9 3.8  Chloride 98 - 111 mmol/L 104 106 107  CO2 22 - 32 mmol/L 24 21(L) 20(L)  Calcium 8.9 - 10.3 mg/dL 9.1 8.7(L) 8.6(L)  Total Protein 6.5 - 8.1 g/dL 7.2 7.4 7.4  Total Bilirubin 0.3 - 1.2 mg/dL 0.5 0.5 0.5  Alkaline Phos 38 - 126 U/L 142(H) 154(H) 147(H)  AST 15 - 41 U/L 32 30 25  ALT 0 - 44 U/L 11 13 11        DIAGNOSTIC IMAGING:  I have independently reviewed the scans and discussed with the patient.   I have reviewed Francene Finders, NP's note and agree with the documentation.  I personally performed a face-to-face visit, made revisions and my assessment and plan is as follows.    ASSESSMENT & PLAN:   Primary prostate cancer with metastasis from prostate to other site [facility]) 1.  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 - Zytiga and prednisone started in January 2019. -Last bone scan and CT scan on 04/27/2018 shows worsening bone metastasis with no visceral metastasis. - He self discontinue it secondary to side effects.  He was started back at low dose of 1 mg daily and discontinued it in August 2019 secondary to intolerance from nausea and severe tiredness.  -Bone marrow biopsy on 10/11/2018 consistent with metastatic adenocarcinoma infiltration. -UroLift procedure was done on 10/16/2018 which is helping his urine flow.  He does not have to self catheterize anymore. -Enzalutamide was started on 10/22/2018, increased to 4 tablets daily, self discontinued in January 2020. - He apparently self discontinued enzalutamide in January 2020 as he is not tolerating it well.  His main complaint was feeling of nausea all the time.  He tried taking Compazine but did not help. -He was hospitalized from 12/28/2018 through 12/29/2018 and received 3 units of blood transfusion for  hemoglobin of 5.2. - He was hospitalized again on 01/29/2019 through 01/30/2019 for hemoglobin of 7.2 and received 2 units of PRBC.  Hemoglobin at discharge was 8.9 last week. - His PSA came down from 254 (09/25/2018) to 153 (12/28/2018). - We started him back on [name] 22.5 mg at last visit on 02/06/2019. -I have recommended active treatment for his prostate cancer.  Patient is reluctant to consider chemotherapy.  I have recommended apalutamide  240 mg daily.  We talked about side effects in detail. - We have obtained free medication from the drug company.  I have given the samples to him today.  He will start with 3 pills daily and increase it to 4 pills daily in 1 week. -He is complaining of numbness in the lower lip and submental region for the last 2 to 3 weeks.   I have recommended doing an MRI of the brain with and without gadolinium. -He is also not eating well because of nausea.  I have given a scopolamine patch today in our office.  If it helps, he will call us back and request prescription. - We reviewed his blood work today.  Hemoglobin is 7.8.  This is from bone marrow infiltration by prostate cancer.  He is arranged to get a transfusion tomorrow.  2.  Bone metastasis: - We have resumed his Xgeva on 12/13/2018. -He will continue calcium and vitamin D supplements.    3.  Family history: - Sister died of metastatic cancer.  Primary is unknown to the patient.  Another sister died of colon cancer. -We ordered genetic testing which was negative for common hereditary mutations.  4.  Cancer related pain: -He has pains in the upper back and anterior chest wall. -He is taking Percocet 10 mg at bedtime.  He is also taking tramadol 50 mg 2 tablets at a time as needed. -Oxycodone brings the pain down from 10-4. -I will increase his Percocet 10 mg to every 6 hours as needed.  He was instructed to take stool softener.      Orders placed this encounter:  Orders Placed This Encounter  Procedures  . MR Brain W Wo Contrast  . Practitioner attestation of consent  . Complete patient signature process for consent form  . Care order/instruction  . Type and screen  . Prepare RBC      [name], MD [place] [phone]

## 2019-02-19 NOTE — Patient Instructions (Addendum)
[place] at [facility] Discharge Instructions  You were seen today by Dr. Delton Coombes. He discussed how you've been feeling and what symptoms you are having. He would like for you to keep your appointment tomorrow for your transfusion. We will schedule you for a MRI of the brain and see you back in the clinic to go over results.    Thank you for choosing [facility] at [facility] to provide your oncology and hematology care.  To afford each patient quality time with our provider, please arrive at least 15 minutes before your scheduled appointment time.   If you have a lab appointment with the [facility] please come in thru the  Main Entrance and check in at the main information desk  You need to re-schedule your appointment should you arrive 10 or more minutes late.  We strive to give you quality time with our providers, and arriving late affects you and other patients whose appointments are after yours.  Also, if you no show three or more times for appointments you may be dismissed from the clinic at the providers discretion.     Again, thank you for choosing [facility].  Our hope is that these requests will decrease the amount of time that you wait before being seen by our physicians.       _____________________________________________________________  Should you have questions after your visit to [facility], please contact our office at [phone] between the hours of 8:00 a.m. and 4:30 p.m.  Voicemails left after 4:00 p.m. will not be returned until the following business day.  For prescription refill requests, have your pharmacy contact our office and allow 72 hours.    Cancer Center Support Programs:   > Cancer Support Group  2nd Tuesday of the month 1pm-2pm, Journey Room

## 2019-02-19 NOTE — Addendum Note (Signed)
Addended by: [name] on: 02/19/2019 03:37 PM   Modules accepted: Orders

## 2019-02-19 NOTE — Assessment & Plan Note (Signed)
1.  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 - Zytiga and prednisone started in January 2019. -Last bone scan and CT scan on 04/27/2018 shows worsening bone metastasis with no visceral metastasis. - He self discontinue it secondary to side effects.  He was started back at low dose of 1 mg daily and discontinued it in August 2019 secondary to intolerance from nausea and severe tiredness.  -Bone marrow biopsy on 10/11/2018 consistent with metastatic adenocarcinoma infiltration. -UroLift procedure was done on 10/16/2018 which is helping his urine flow.  He does not have to self catheterize anymore. -Enzalutamide was started on 10/22/2018, increased to 4 tablets daily, self discontinued in January 2020. - He apparently self discontinued enzalutamide in January 2020 as he is not tolerating it well.  His main complaint was feeling of nausea all the time.  He tried taking Compazine but did not help. -He was hospitalized from 12/28/2018 through 12/29/2018 and received 3 units of blood transfusion for hemoglobin of 5.2. - He was hospitalized again on 01/29/2019 through 01/30/2019 for hemoglobin of 7.2 and received 2 units of PRBC.  Hemoglobin at discharge was 8.9 last week. - His PSA came down from 254 (09/25/2018) to 153 (12/28/2018). - We started him back on [name] 22.5 mg at last visit on 02/06/2019. -I have recommended active treatment for his prostate cancer.  Patient is reluctant to consider chemotherapy.  I have recommended apalutamide  240 mg daily.  We talked about side effects in detail. - We have obtained free medication from the drug company.  I have given the samples to him today.  He will start with 3 pills daily and increase it to 4 pills daily in 1 week. -He is complaining of numbness in the  lower lip and submental region for the last 2 to 3 weeks.  I have recommended doing an MRI of the brain with and without gadolinium. -He is also not eating well because of nausea.  I have given a scopolamine patch today in our office.  If it helps, he will call us back and request prescription. - We reviewed his blood work today.  Hemoglobin is 7.8.  This is from bone marrow infiltration by prostate cancer.  He is arranged to get a transfusion tomorrow.  2.  Bone metastasis: - We have resumed his Xgeva on 12/13/2018. -He will continue calcium and vitamin D supplements.    3.  Family history: - Sister died of metastatic cancer.  Primary is unknown to the patient.  Another sister died of colon cancer. -We ordered genetic testing which was negative for common hereditary mutations.  4.  Cancer related pain: -He has pains in the upper back and anterior chest wall. -He is taking Percocet 10 mg at bedtime.  He is also taking tramadol 50 mg 2 tablets at a time as needed. -Oxycodone brings the pain down from 10-4. -I will increase his Percocet 10 mg to every 6 hours as needed.  He was instructed to take stool softener. 

## 2019-02-20 ENCOUNTER — Inpatient Hospital Stay (HOSPITAL_COMMUNITY): Payer: Medicare Other

## 2019-02-20 ENCOUNTER — Encounter (HOSPITAL_COMMUNITY): Payer: Self-pay | Admitting: Lab

## 2019-02-20 ENCOUNTER — Encounter (HOSPITAL_COMMUNITY): Payer: Self-pay

## 2019-02-20 ENCOUNTER — Inpatient Hospital Stay (HOSPITAL_BASED_OUTPATIENT_CLINIC_OR_DEPARTMENT_OTHER): Payer: Medicare Other | Admitting: Hematology

## 2019-02-20 ENCOUNTER — Encounter (HOSPITAL_COMMUNITY): Payer: Self-pay | Admitting: *Deleted

## 2019-02-20 ENCOUNTER — Other Ambulatory Visit: Payer: Self-pay

## 2019-02-20 ENCOUNTER — Encounter (HOSPITAL_COMMUNITY): Payer: Self-pay | Admitting: Hematology

## 2019-02-20 VITALS — BP 119/56 | HR 97 | Temp 98.1°F | Resp 18 | Wt 157.4 lb

## 2019-02-20 DIAGNOSIS — M549 Dorsalgia, unspecified: Secondary | ICD-10-CM

## 2019-02-20 DIAGNOSIS — Z79899 Other long term (current) drug therapy: Secondary | ICD-10-CM

## 2019-02-20 DIAGNOSIS — R112 Nausea with vomiting, unspecified: Secondary | ICD-10-CM

## 2019-02-20 DIAGNOSIS — C7951 Secondary malignant neoplasm of bone: Secondary | ICD-10-CM

## 2019-02-20 DIAGNOSIS — G8929 Other chronic pain: Secondary | ICD-10-CM

## 2019-02-20 DIAGNOSIS — Z800 Family history of malignant neoplasm of digestive organs: Secondary | ICD-10-CM

## 2019-02-20 DIAGNOSIS — R200 Anesthesia of skin: Secondary | ICD-10-CM

## 2019-02-20 DIAGNOSIS — G479 Sleep disorder, unspecified: Secondary | ICD-10-CM

## 2019-02-20 DIAGNOSIS — Z87891 Personal history of nicotine dependence: Secondary | ICD-10-CM

## 2019-02-20 DIAGNOSIS — C61 Malignant neoplasm of prostate: Secondary | ICD-10-CM | POA: Diagnosis not present

## 2019-02-20 DIAGNOSIS — M8580 Other specified disorders of bone density and structure, unspecified site: Secondary | ICD-10-CM

## 2019-02-20 DIAGNOSIS — G893 Neoplasm related pain (acute) (chronic): Secondary | ICD-10-CM

## 2019-02-20 DIAGNOSIS — E785 Hyperlipidemia, unspecified: Secondary | ICD-10-CM

## 2019-02-20 DIAGNOSIS — D649 Anemia, unspecified: Secondary | ICD-10-CM

## 2019-02-20 DIAGNOSIS — F1011 Alcohol abuse, in remission: Secondary | ICD-10-CM

## 2019-02-20 DIAGNOSIS — I10 Essential (primary) hypertension: Secondary | ICD-10-CM

## 2019-02-20 MED ORDER — DIPHENHYDRAMINE HCL 25 MG PO CAPS
25.0000 mg | ORAL_CAPSULE | Freq: Once | ORAL | Status: AC
  Administered 2019-02-20: 25 mg via ORAL
  Filled 2019-02-20: qty 1

## 2019-02-20 MED ORDER — SODIUM CHLORIDE 0.9% FLUSH: 10.0000 mL | INTRAVENOUS | Status: DC | PRN

## 2019-02-20 MED ORDER — SODIUM CHLORIDE 0.9% IV SOLUTION
250.0000 mL | Freq: Once | INTRAVENOUS | Status: AC
  Administered 2019-02-20: 250 mL via INTRAVENOUS

## 2019-02-20 MED ORDER — ACETAMINOPHEN 325 MG PO TABS
650.0000 mg | ORAL_TABLET | Freq: Once | ORAL | Status: AC
  Administered 2019-02-20: 650 mg via ORAL
  Filled 2019-02-20: qty 2

## 2019-02-20 MED ORDER — SCOPOLAMINE 1 MG/3DAYS TD PT72: 1.0000 | MEDICATED_PATCH | TRANSDERMAL | 12 refills | Status: AC

## 2019-02-20 NOTE — Progress Notes (Unsigned)
Referral sent to [facility]. Records faxed on 3/11

## 2019-02-20 NOTE — Assessment & Plan Note (Signed)
1.  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 - Zytiga and prednisone started in January 2019. -Last bone scan and CT scan on 04/27/2018 shows worsening bone metastasis with no visceral metastasis. - He self discontinue it secondary to side effects.  He was started back at low dose of 1 mg daily and discontinued it in August 2019 secondary to intolerance from nausea and severe tiredness.  -Bone marrow biopsy on 10/11/2018 consistent with metastatic adenocarcinoma infiltration. -UroLift procedure was done on 10/16/2018 which is helping his urine flow.  He does not have to self catheterize anymore. -Enzalutamide was started on 10/22/2018, increased to 4 tablets daily, self discontinued in January 2020. - He apparently self discontinued enzalutamide in January 2020 as he is not tolerating it well.  His main complaint was feeling of nausea all the time.  He tried taking Compazine but did not help. -He was hospitalized from 12/28/2018 through 12/29/2018 and received 3 units of blood transfusion for hemoglobin of 5.2. - He was hospitalized again on 01/29/2019 through 01/30/2019 for hemoglobin of 7.2 and received 2 units of PRBC.  Hemoglobin at discharge was 8.9 last week. - His PSA came down from 254 (09/25/2018) to 153 (12/28/2018). - We started him back on [name] 22.5 mg at last visit on 02/06/2019. -I have recommended active treatment for his prostate cancer.  Patient is reluctant to consider chemotherapy.  I have recommended apalutamide  240 mg daily.  We talked about side effects in detail. - He received free samples from the company.  He started taking 3 tablets daily for a week on 02/19/2019. -I have done an MRI of the brain on 02/19/2019.  It was motion degraded, prematurely truncated exam  demonstrating multiple suspected tumor deposits in the epidural space over both cerebral hemispheres.  They could not see the base of the skull. -I have recommended a CT scan of the head with and without contrast. -I will make a referral to Dr.Yanagihara in [place]. -His nausea is better with scopolamine patch.  We will send a prescription for it. -I will see him back in 1 week for follow-up with repeat blood work. - For his anemia he is receiving 1 unit of blood transfusion today.  2.  Bone metastasis: - We have resumed his Xgeva on 12/13/2018. -He will continue calcium and vitamin D supplements.    3.  Family history: - Sister died of metastatic cancer.  Primary is unknown to the patient.  Another sister died of colon cancer. -We ordered genetic testing which was negative for common hereditary mutations.  4.  Cancer related pain: -He has pains in the upper back and anterior chest wall. -I have increased his Percocet to 10 mg every 6 hours.

## 2019-02-20 NOTE — Addendum Note (Signed)
Addended by: Farley Ly on: 02/20/2019 04:07 PM   Modules accepted: Orders

## 2019-02-20 NOTE — Progress Notes (Signed)
Pt presents today for appointment with Dr. Delton Coombes and 1 unit of PRBC's.   1 Unit PRBC's given today per MD orders. Tolerated infusion without adverse affects. Vital signs stable. No complaints at this time. Discharged from clinic ambulatory. F/U with [facility] as scheduled.

## 2019-02-20 NOTE — Patient Instructions (Signed)
[facility] at [facility]  Discharge Instructions:   _______________________________________________________________  Thank you for choosing [facility] at [facility] to provide your oncology and hematology care.  To afford each patient quality time with our providers, please arrive at least 15 minutes before your scheduled appointment.  You need to re-schedule your appointment if you arrive 10 or more minutes late.  We strive to give you quality time with our providers, and arriving late affects you and other patients whose appointments are after yours.  Also, if you no show three or more times for appointments you may be dismissed from the clinic.  Again, thank you for choosing  Can[facility] at [facility]. Our hope is that these requests will allow you access to exceptional care and in a timely manner. _______________________________________________________________  If you have questions after your visit, please contact our office at [phone] between the hours of 8:30 a.m. and 5:00 p.m. Voicemails left after 4:30 p.m. will not be returned until the following business day. _______________________________________________________________  For prescription refill requests, have your pharmacy contact our office. _______________________________________________________________  Recommendations made by the consultant and any test results will be sent to your referring physician. _______________________________________________________________ 

## 2019-02-20 NOTE — Progress Notes (Signed)
I spoke with patient via telephone and advised that we were referring him to radiation oncology in [place] and he should expect a phone call from them.  I also notified him that we are ordering a CT scan of the head since the MRI was not able to be completed.  He verbalizes understanding of the above and will await appointment times.

## 2019-02-20 NOTE — Progress Notes (Signed)
[address] [place], [place] [address]   CLINIC:  Medical Oncology/Hematology  PCP:  Dettinger, Fransisca Kaufmann, MD [place] [id number] [phone]   REASON FOR VISIT:  Follow-up for metastatic prostate cancer  CURRENT THERAPY:[name] and apalutamide.   BRIEF ONCOLOGIC HISTORY:    Primary prostate cancer with metastasis from prostate to other site [facility])   06/20/2011 Initial Diagnosis    Prostate cancer ([place])    01/12/2016 Imaging    Bone density- This patient is considered osteopenic by World Healh Organization (WHO) Criteria.     03/16/2016 Imaging    CT CAP- No evidence of metastatic disease within the chest, abdomen, or pelvis. No other acute findings identified.    06/17/2016 Imaging    Bone scan- 1. Negative for metastatic pattern.    07/11/2016 Miscellaneous    Prolia every 6 months for osteopenia.    09/12/2016 -  Chemotherapy    Casodex/Depo-[name] every 3 months     09/27/2016 Imaging    CT CAP -No acute findings and no evidence for metastatic disease within the chest, abdomen or pelvis.    11/13/2017 Progression    Bone Scan: Multiple new abnormal sites of increased osseous tracer accumulation are identified as above consistent with osseous metastatic disease.  These include new foci of uptake at the proximal femora bilaterally.    11/16/2017 Progression    CT CAP-IMPRESSION: 1. New enlarged right pelvic sidewall lymph node and borderline left periaortic lymph node worrisome for metastatic adenopathy. 2. Subtle areas of sclerosis within the lumbar spine are more conspicuous than on the previous exam and worrisome for metastatic disease. Overall the areas of bone metastasis are more better demonstrated on bone scan from 11/13/2017 which demonstrate a progression of bone metastasis.    02/08/2019 Genetic Testing    Negative genetic testing on the common hereditary cancer panel.  The Hereditary Gene Panel offered by  [facility] includes sequencing and/or deletion duplication testing of the following 47 genes: APC, ATM, AXIN2, BARD1, BMPR1A, BRCA1, BRCA2, BRIP1, CDH1, CDK4, CDKN2A (p14ARF), CDKN2A (p16INK4a), CHEK2, CTNNA1, DICER1, EPCAM (Deletion/duplication testing only), GREM1 (promoter region deletion/duplication testing only), KIT, MEN1, MLH1, MSH2, MSH3, MSH6, MUTYH, NBN, NF1, NHTL1, PALB2, PDGFRA, PMS2, POLD1, POLE, PTEN, RAD50, RAD51C, RAD51D, SDHB, SDHC, SDHD, SMAD4, SMARCA4. STK11, TP53, TSC1, TSC2, and VHL.  The following genes were evaluated for sequence changes only: SDHA and HOXB13 c.251G>A variant only. The report date is February 08, 2019.      CANCER STAGING: Cancer Staging Primary prostate cancer with metastasis from prostate to other site [facility]) Staging form: Prostate, AJCC 7th Edition - Clinical: Stage IIC (pT2c, N0, M0) - Signed by Baird Cancer, PA on 06/20/2011    INTERVAL HISTORY:  Walter Boone 68 y.o. male returns for routine follow-up and consideration for next cycle of chemotherapy. He is here today by himself. He states that he is still experiencing the numbness in is face. He states that his nausea is better.He is here today for blood transfusion.Denies any nausea, vomiting, or diarrhea. Denies any new pains. Had not noticed any recent bleeding such as epistaxis, hematuria or hematochezia. Denies recent chest pain on exertion, shortness of breath on minimal exertion, pre-syncopal episodes, or palpitations. Denies any numbness or tingling in hands or feet. Denies any recent fevers, infections, or recent hospitalizations.       REVIEW OF SYSTEMS:  Review of Systems  Musculoskeletal: Positive for back pain.  Neurological: Positive for numbness.  All other systems reviewed and are negative.    PAST MEDICAL/SURGICAL HISTORY:  Past Medical History:  Diagnosis Date  . Acid reflux   . Alcoholism ([place])   . Arthritis   . Bony metastasis ([place])   . Chronic back pain   .  Diverticulosis   . Family history of colon cancer   . Fatty liver   . Gynecomastia, male 01/11/2013   Secondary to prostate ca Tx.   . H/O Bell's palsy   . Hemorrhoids    history  . High cholesterol   . History of back injury 11/19/2013  . Hyperglycemia   . Hyperlipidemia   . Hypertension   . Prostate cancer ([place])   . Prostate cancer ([place]) 06/20/2011  . Prostate carcinoma, recurrent ([place]) 08/10/2012   Past Surgical History:  Procedure Laterality Date  . [place]   left  . COLONOSCOPY  2008  . CYSTOSCOPY WITH INSERTION OF UROLIFT N/A 10/16/2018   Procedure: CYSTOSCOPY WITH INSERTION OF UROLIFT;  Surgeon: Franchot Gallo, MD;  Location: [facility];  Service: Urology;  Laterality: N/A;  . ESOPHAGOGASTRODUODENOSCOPY    . HAND SURGERY     nerve was torn - left hand  . HERNIA REPAIR  2010  . I&D EXTREMITY Left 06/10/2015   Procedure: IRRIGATION AND DEBRIDEMENT EXTREMITY LEFT HAND EXPLORATION, nerve repair;  Surgeon: Charlotte Crumb, MD;  Location: [place];  Service: Orthopedics;  Laterality: Left;  . KNEE SURGERY     left  . PROSTATE BIOPSY  11/06     SOCIAL HISTORY:  Social History   Socioeconomic History  . Marital status: Married    Spouse name: Not on file  . Number of children: 4  . Years of education: Not on file  . Highest education level: GED or equivalent  Occupational History  . Occupation: Disabled  Social Needs  . Financial resource strain: Not very hard  . Food insecurity:    Worry: Sometimes true    Inability: Sometimes true  . Transportation needs:    Medical: No    Non-medical: No  Tobacco Use  . Smoking status: Former Smoker    Packs/day: 2.50    Years: 3.00    Pack years: 7.50  . Smokeless tobacco: Never Used  Substance and Sexual Activity  . Alcohol use: No    Comment: former drinker 20 years ago  . Drug use: No  . Sexual activity: Not Currently  Lifestyle  . Physical activity:    Days per week: 0 days    Minutes per session: 0 min   . Stress: Not at all  Relationships  . Social connections:    Talks on phone: More than three times a week    Gets together: More than three times a week    Attends religious service: Not on file    Active member of club or organization: Not on file    Attends meetings of clubs or organizations: Not on file    Relationship status: Married  . Intimate partner violence:    Fear of current or ex partner: No    Emotionally abused: No    Physically abused: No    Forced sexual activity: No  Other Topics Concern  . Not on file  Social History Narrative  . Not on file    FAMILY HISTORY:  Family History  Problem Relation Age of Onset  . Heart failure Mother   . Diabetes Father   . Cancer Sister   . Diabetes  Brother   . Cancer Sister        ? colon cancer; d. in her 76s  . Muscular dystrophy Sister   . Pneumonia Brother        d. 11 mo  . Lung cancer Nephew     CURRENT MEDICATIONS:  Outpatient Encounter Medications as of 02/20/2019  Medication Sig Note  . apalutamide (ERLEADA) 60 MG tablet Take 4 tablets (240 mg total) by mouth daily. May be taken with or without food. Swallow tablets whole.   . Calcium-Magnesium-Vitamin D (CALCIUM 1200+D3 PO) Take 1 tablet by mouth daily.    . Cholecalciferol 50 MCG (2000 UT) CHEW Chew 1 tablet by mouth.   . denosumab (XGEVA) 120 MG/1.7ML SOLN injection Inject 120 mg into the skin once. 12/28/2018: Next shot due 01/13/2019  . diclofenac sodium (VOLTAREN) 1 % GEL Apply 2 g topically 4 (four) times daily.   . ferrous sulfate (FERROUSUL) 325 (65 FE) MG tablet Take 1 tablet (325 mg total) by mouth daily with breakfast.   . HYDROcodone-acetaminophen (NORCO) 10-325 MG tablet Take 1 tablet by mouth every 8 (eight) hours as needed.   Marland Kitchen Leuprolide Acetate ([name] IJ) Inject 1 application as directed every 6 (six) months.    . Menthol-Methyl Salicylate (MUSCLE RUB) 10-15 % CREA Apply 1 application topically daily as needed for muscle pain.    . Nutritional  Supplements (EQUATE PLUS) LIQD Take 1 Can by mouth 2 (two) times daily.   Marland Kitchen OVER THE COUNTER MEDICATION See admin instructions. preggie pop drops - take 1 lozenge as needed.    Marland Kitchen oxyCODONE-acetaminophen (PERCOCET) 10-325 MG tablet Take 1 tablet by mouth every 6 (six) hours as needed for pain.   . promethazine (PHENERGAN) 25 MG tablet Take 1 tablet (25 mg total) by mouth every 6 (six) hours as needed for nausea or vomiting.   Marland Kitchen scopolamine (TRANSDERM-SCOP) 1 MG/3DAYS Place 1 patch (1.5 mg total) onto the skin every 3 (three) days.   . traMADol (ULTRAM) 50 MG tablet Take 2 tablets (100 mg total) by mouth every 4 (four) hours as needed.    No facility-administered encounter medications on file as of 02/20/2019.     ALLERGIES:  No Known Allergies   PHYSICAL EXAM:  ECOG Performance status: 1  I have reviewed his vitals.  Blood pressure is 119/56 pulse rate is 97 respirate is 18 temperature is 98.1.  Saturations are 99%.  Physical Exam Constitutional:      Appearance: Normal appearance.  Cardiovascular:     Rate and Rhythm: Normal rate and regular rhythm.  Pulmonary:     Effort: Pulmonary effort is normal.     Breath sounds: Normal breath sounds.  Abdominal:     General: Bowel sounds are normal.     Palpations: Abdomen is soft.  Musculoskeletal:        General: No swelling.  Skin:    General: Skin is warm.  Neurological:     Mental Status: He is alert and oriented to person, place, and time.     Sensory: Sensory deficit present.  Psychiatric:        Mood and Affect: Mood normal.        Behavior: Behavior normal.      LABORATORY DATA:  I have reviewed the labs as listed.  CBC    Component Value Date/Time   WBC 5.0 02/19/2019 1330   RBC 2.54 (L) 02/19/2019 1330   HGB 7.8 (L) 02/19/2019 1330   HGB 8.5 (LL) 01/11/2019  1437   HCT 24.7 (L) 02/19/2019 1330   HCT 24.4 (L) 01/11/2019 1437   PLT 126 (L) 02/19/2019 1330   PLT 201 01/11/2019 1437   MCV 97.2 02/19/2019 1330    MCV 88 01/11/2019 1437   MCH 30.7 02/19/2019 1330   MCHC 31.6 02/19/2019 1330   RDW 17.2 (H) 02/19/2019 1330   RDW 17.1 (H) 01/11/2019 1437   LYMPHSABS 1.3 02/19/2019 1330   LYMPHSABS 1.3 01/11/2019 1437   MONOABS 0.5 02/19/2019 1330   EOSABS 0.1 02/19/2019 1330   EOSABS 0.1 01/11/2019 1437   BASOSABS 0.0 02/19/2019 1330   BASOSABS 0.0 01/11/2019 1437   CMP Latest Ref Rng & Units 02/19/2019 02/14/2019 01/29/2019  Glucose 70 - 99 mg/dL 128(H) 127(H) 108(H)  BUN 8 - 23 mg/dL 29(H) 29(H) 25(H)  Creatinine 0.61 - 1.24 mg/dL 0.92 0.95 0.82  Sodium 135 - 145 mmol/L 136 137 137  Potassium 3.5 - 5.1 mmol/L 4.6 3.9 3.8  Chloride 98 - 111 mmol/L 104 106 107  CO2 22 - 32 mmol/L 24 21(L) 20(L)  Calcium 8.9 - 10.3 mg/dL 9.1 8.7(L) 8.6(L)  Total Protein 6.5 - 8.1 g/dL 7.2 7.4 7.4  Total Bilirubin 0.3 - 1.2 mg/dL 0.5 0.5 0.5  Alkaline Phos 38 - 126 U/L 142(H) 154(H) 147(H)  AST 15 - 41 U/L 32 30 25  ALT 0 - 44 U/L _0 DIAGNOSTIC IMAGING:  I have independently reviewed the scans and discussed with the patient.     ASSESSMENT & PLAN:   Primary prostate cancer with metastasis from prostate to other site [facility]) 1.  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 - Zytiga and prednisone started in January 2019. -Last bone scan and CT scan on 04/27/2018 shows worsening bone metastasis with no visceral metastasis. - He self discontinue it secondary to side effects.  He was started back at low dose of 1 mg daily and discontinued it in August 2019 secondary to intolerance from nausea and severe tiredness.  -Bone marrow biopsy on 10/11/2018 consistent with metastatic adenocarcinoma infiltration. -UroLift procedure was done on 10/16/2018 which is helping his urine flow.  He does not have to  self catheterize anymore. -Enzalutamide was started on 10/22/2018, increased to 4 tablets daily, self discontinued in January 2020. - He apparently self discontinued enzalutamide in January 2020 as he is not tolerating it well.  His main complaint was feeling of nausea all the time.  He tried taking Compazine but did not help. -He was hospitalized from 12/28/2018 through 12/29/2018 and received 3 units of blood transfusion for hemoglobin of 5.2. - He was hospitalized again on 01/29/2019 through 01/30/2019 for hemoglobin of 7.2 and received 2 units of PRBC.  Hemoglobin at discharge was 8.9 last week. - His PSA came down from 254 (09/25/2018) to 153 (12/28/2018). - We started him back on [name] 22.5 mg at last visit on 02/06/2019. -I have recommended active treatment for his prostate cancer.  Patient is reluctant to consider chemotherapy.  I have recommended apalutamide  240 mg daily.  We talked about side effects in detail. - He received free samples from the company.  He started taking 3 tablets daily for a week on 02/19/2019. -I have done an MRI of the brain on 02/19/2019.  It was motion degraded,  prematurely truncated exam demonstrating multiple suspected tumor deposits in the epidural space over both cerebral hemispheres.  They could not see the base of the skull. -I have recommended a CT scan of the head with and without contrast. -I will make a referral to Dr.Yanagihara in [place]. -His nausea is better with scopolamine patch.  We will send a prescription for it. -I will see him back in 1 week for follow-up with repeat blood work. - For his anemia he is receiving 1 unit of blood transfusion today.  2.  Bone metastasis: - We have resumed his Xgeva on 12/13/2018. -He will continue calcium and vitamin D supplements.    3.  Family history: - Sister died of metastatic cancer.  Primary is unknown to the patient.  Another sister died of colon cancer. -We ordered genetic testing which was negative for common  hereditary mutations.  4.  Cancer related pain: -He has pains in the upper back and anterior chest wall. -I have increased his Percocet to 10 mg every 6 hours.      Orders placed this encounter:  Orders Placed This Encounter  Procedures  . CT Head [place], [place] [phone]

## 2019-02-20 NOTE — Patient Instructions (Addendum)
[place] at [facility] Discharge Instructions  You were seen today by Dr. Delton Coombes. He went over your recent test results. He will see you back in 7-10days  for labs and follow up.   Thank you for choosing [place] at [facility] to provide your oncology and hematology care.  To afford each patient quality time with our provider, please arrive at least 15 minutes before your scheduled appointment time.   If you have a lab appointment with the [facility] please come in thru the  Main Entrance and check in at the main information desk  You need to re-schedule your appointment should you arrive 10 or more minutes late.  We strive to give you quality time with our providers, and arriving late affects you and other patients whose appointments are after yours.  Also, if you no show three or more times for appointments you may be dismissed from the clinic at the providers discretion.     Again, thank you for choosing [facility].  Our hope is that these requests will decrease the amount of time that you wait before being seen by our physicians.       _____________________________________________________________  Should you have questions after your visit to [facility], please contact our office at [phone] between the hours of 8:00 a.m. and 4:30 p.m.  Voicemails left after 4:00 p.m. will not be returned until the following business day.  For prescription refill requests, have your pharmacy contact our office and allow 72 hours.    Cancer Center Support Programs:   > Cancer Support Group  2nd Tuesday of the month 1pm-2pm, Journey Room

## 2019-02-21 ENCOUNTER — Ambulatory Visit (HOSPITAL_COMMUNITY)
Admission: RE | Admit: 2019-02-21 | Discharge: 2019-02-21 | Disposition: A | Discharge status: Home / Self Care | Payer: Medicare Other | Source: Ambulatory Visit | Attending: Hematology | Admitting: Hematology

## 2019-02-21 DIAGNOSIS — C61 Malignant neoplasm of prostate: Secondary | ICD-10-CM | POA: Diagnosis present

## 2019-02-21 DIAGNOSIS — R200 Anesthesia of skin: Secondary | ICD-10-CM | POA: Insufficient documentation

## 2019-02-21 LAB — BPAM RBC
BLOOD PRODUCT EXPIRATION DATE: 202003232359
ISSUE DATE / TIME: 202003111108
Unit Type and Rh: 600

## 2019-02-21 LAB — TYPE AND SCREEN
ABO/RH(D): A NEG
Antibody Screen: NEGATIVE
Unit division: 0

## 2019-02-21 MED ORDER — IOHEXOL 300 MG/ML  SOLN
75.0000 mL | Freq: Once | INTRAMUSCULAR | Status: AC | PRN
  Administered 2019-02-21: 75 mL via INTRAVENOUS

## 2019-02-27 ENCOUNTER — Telehealth (HOSPITAL_COMMUNITY): Payer: Self-pay | Admitting: Pharmacist

## 2019-02-27 ENCOUNTER — Other Ambulatory Visit (HOSPITAL_COMMUNITY): Payer: Self-pay

## 2019-02-27 ENCOUNTER — Other Ambulatory Visit: Payer: Self-pay

## 2019-02-27 DIAGNOSIS — C61 Malignant neoplasm of prostate: Secondary | ICD-10-CM

## 2019-02-27 NOTE — Telephone Encounter (Signed)
Oral Chemotherapy Pharmacist Encounter   Discussed with patient on 02/14/2019 the plan for him to stop by to see Angie (financial advocate) during his office visit on 02/19/2019 to sign Erleada assistance paperwork and bring in his tax forms. He has not yet stopped to sign the paperwork. He currently does not have prescription insurance to cover his [place] and completing this application is the only way for him to access [place]. He was give a 24 day sample of Erleada on 02/19/2019 but without applying for manufacture assistance we can not ensure further access to the [place]. Additionally the application can take weeks to process, he may have a break in therapy due to the delay in application signing.  Called Mr. Lamphier to ask/remind him to bring his tax forms with him to his appt tomorrow 02/28/2019 and stop by to see Angie to sign the assistance paperwork. He stated his understanding of the plan. Will continue to follow.   Darl Pikes, PharmD, BCPS, [facility] Hematology/Oncology Clinical Pharmacist [facility] [phone]  02/27/2019 12:01 PM

## 2019-02-28 ENCOUNTER — Inpatient Hospital Stay (HOSPITAL_COMMUNITY): Payer: Medicare Other

## 2019-02-28 ENCOUNTER — Encounter (HOSPITAL_COMMUNITY): Payer: Self-pay | Admitting: Hematology

## 2019-02-28 ENCOUNTER — Inpatient Hospital Stay (HOSPITAL_BASED_OUTPATIENT_CLINIC_OR_DEPARTMENT_OTHER): Payer: Medicare Other | Admitting: Hematology

## 2019-02-28 VITALS — BP 134/64 | HR 86 | Temp 98.2°F | Resp 18 | Wt 157.4 lb

## 2019-02-28 DIAGNOSIS — Z800 Family history of malignant neoplasm of digestive organs: Secondary | ICD-10-CM

## 2019-02-28 DIAGNOSIS — Z9221 Personal history of antineoplastic chemotherapy: Secondary | ICD-10-CM | POA: Diagnosis not present

## 2019-02-28 DIAGNOSIS — C61 Malignant neoplasm of prostate: Secondary | ICD-10-CM

## 2019-02-28 DIAGNOSIS — R5383 Other fatigue: Secondary | ICD-10-CM

## 2019-02-28 DIAGNOSIS — Z79899 Other long term (current) drug therapy: Secondary | ICD-10-CM

## 2019-02-28 DIAGNOSIS — G893 Neoplasm related pain (acute) (chronic): Secondary | ICD-10-CM

## 2019-02-28 DIAGNOSIS — C7951 Secondary malignant neoplasm of bone: Secondary | ICD-10-CM | POA: Diagnosis not present

## 2019-02-28 DIAGNOSIS — M8580 Other specified disorders of bone density and structure, unspecified site: Secondary | ICD-10-CM

## 2019-02-28 DIAGNOSIS — E785 Hyperlipidemia, unspecified: Secondary | ICD-10-CM

## 2019-02-28 DIAGNOSIS — R112 Nausea with vomiting, unspecified: Secondary | ICD-10-CM

## 2019-02-28 DIAGNOSIS — F1011 Alcohol abuse, in remission: Secondary | ICD-10-CM

## 2019-02-28 DIAGNOSIS — R51 Headache: Secondary | ICD-10-CM

## 2019-02-28 DIAGNOSIS — R269 Unspecified abnormalities of gait and mobility: Secondary | ICD-10-CM

## 2019-02-28 DIAGNOSIS — K760 Fatty (change of) liver, not elsewhere classified: Secondary | ICD-10-CM

## 2019-02-28 DIAGNOSIS — Z9181 History of falling: Secondary | ICD-10-CM

## 2019-02-28 DIAGNOSIS — M546 Pain in thoracic spine: Secondary | ICD-10-CM

## 2019-02-28 DIAGNOSIS — Z87891 Personal history of nicotine dependence: Secondary | ICD-10-CM

## 2019-02-28 DIAGNOSIS — I10 Essential (primary) hypertension: Secondary | ICD-10-CM

## 2019-02-28 DIAGNOSIS — M1990 Unspecified osteoarthritis, unspecified site: Secondary | ICD-10-CM

## 2019-02-28 LAB — CBC WITH DIFFERENTIAL/PLATELET
Abs Immature Granulocytes: 0.64 10*3/uL — ABNORMAL HIGH (ref 0.00–0.07)
BASOS PCT: 1 %
Basophils Absolute: 0.1 10*3/uL (ref 0.0–0.1)
Eosinophils Absolute: 0.1 10*3/uL (ref 0.0–0.5)
Eosinophils Relative: 2 %
HCT: 26.1 % — ABNORMAL LOW (ref 39.0–52.0)
Hemoglobin: 8 g/dL — ABNORMAL LOW (ref 13.0–17.0)
Immature Granulocytes: 12 %
Lymphocytes Relative: 26 %
Lymphs Abs: 1.4 10*3/uL (ref 0.7–4.0)
MCH: 30.1 pg (ref 26.0–34.0)
MCHC: 30.7 g/dL (ref 30.0–36.0)
MCV: 98.1 fL (ref 80.0–100.0)
Monocytes Absolute: 0.5 10*3/uL (ref 0.1–1.0)
Monocytes Relative: 8 %
Neutro Abs: 2.7 10*3/uL (ref 1.7–7.7)
Neutrophils Relative %: 51 %
Platelets: 162 10*3/uL (ref 150–400)
RBC: 2.66 MIL/uL — ABNORMAL LOW (ref 4.22–5.81)
RDW: 16.3 % — AB (ref 11.5–15.5)
WBC: 5.3 10*3/uL (ref 4.0–10.5)
nRBC: 2.4 % — ABNORMAL HIGH (ref 0.0–0.2)

## 2019-02-28 LAB — COMPREHENSIVE METABOLIC PANEL
ALT: 16 U/L (ref 0–44)
AST: 31 U/L (ref 15–41)
Albumin: 3.8 g/dL (ref 3.5–5.0)
Alkaline Phosphatase: 128 U/L — ABNORMAL HIGH (ref 38–126)
Anion gap: 11 (ref 5–15)
BUN: 21 mg/dL (ref 8–23)
CO2: 23 mmol/L (ref 22–32)
Calcium: 9.1 mg/dL (ref 8.9–10.3)
Chloride: 106 mmol/L (ref 98–111)
Creatinine, Ser: 0.94 mg/dL (ref 0.61–1.24)
GFR calc Af Amer: >60 mL/min (ref >60)
GFR calc non Af Amer: >60 mL/min (ref >60)
Glucose, Bld: 123 mg/dL — ABNORMAL HIGH (ref 70–99)
Potassium: 3.7 mmol/L (ref 3.5–5.1)
SODIUM: 140 mmol/L (ref 135–145)
Total Bilirubin: 0.7 mg/dL (ref 0.3–1.2)
Total Protein: 7 g/dL (ref 6.5–8.1)

## 2019-02-28 LAB — SAMPLE TO BLOOD BANK

## 2019-02-28 MED ORDER — FENTANYL 25 MCG/HR TD PT72: 1.0000 | MEDICATED_PATCH | TRANSDERMAL | 0 refills | Status: DC

## 2019-02-28 MED ORDER — PROCHLORPERAZINE MALEATE 10 MG PO TABS: 10.0000 mg | ORAL_TABLET | Freq: Four times a day (QID) | ORAL | 0 refills | Status: AC | PRN

## 2019-02-28 NOTE — Progress Notes (Signed)
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     03/16/2016 Imaging    CT CAP- No evidence of metastatic disease within the chest, abdomen, or pelvis. No other acute findings identified.    06/17/2016 Imaging    Bone scan- 1. Negative for metastatic pattern.    07/11/2016 Miscellaneous    Prolia every 6 months for osteopenia.    09/12/2016 -  Chemotherapy    Casodex/Depo-[name] every 3 months     09/27/2016 Imaging    CT CAP -No acute findings and no evidence for metastatic disease within the chest, abdomen or pelvis.    11/13/2017 Progression    Bone Scan: Multiple new abnormal sites of increased osseous tracer accumulation are identified as above consistent with osseous metastatic disease.  These include new foci of uptake at the proximal femora bilaterally.    11/16/2017 Progression    CT CAP-IMPRESSION: 1. New enlarged right pelvic sidewall lymph node and borderline left periaortic lymph node worrisome for metastatic adenopathy. 2. Subtle areas of sclerosis within the lumbar spine are more conspicuous than on the previous exam and worrisome for metastatic disease. Overall the areas of bone metastasis are more better demonstrated on bone scan from 11/13/2017 which demonstrate a progression of bone metastasis.    02/08/2019 Genetic Testing    Negative genetic testing on the common hereditary cancer panel.  The Hereditary Gene Panel offered by [facility]  includes sequencing and/or deletion duplication testing of the following 47 genes: APC, ATM, AXIN2, BARD1, BMPR1A, BRCA1, BRCA2, BRIP1, CDH1, CDK4, CDKN2A (p14ARF), CDKN2A (p16INK4a), CHEK2, CTNNA1, DICER1, EPCAM (Deletion/duplication testing only), GREM1 (promoter region deletion/duplication testing only), KIT, MEN1, MLH1, MSH2, MSH3, MSH6, MUTYH, NBN, NF1, NHTL1, PALB2, PDGFRA, PMS2, POLD1, POLE, PTEN, RAD50, RAD51C, RAD51D, SDHB, SDHC, SDHD, SMAD4, SMARCA4. STK11, TP53, TSC1, TSC2, and VHL.  The following genes were evaluated for sequence changes only: SDHA and HOXB13 c.251G>A variant only. The report date is February 08, 2019.      CANCER STAGING: Cancer Staging Primary prostate cancer with metastasis from prostate to other site [facility]) Staging form: Prostate, AJCC 7th Edition - Clinical: Stage IIC (pT2c, N0, M0) - Signed by Baird Cancer, PA on 06/20/2011    INTERVAL HISTORY:  Mr. Biggins 68 y.o. male returns for routine follow-up. He is here today with his son. He states that he fell twice last night. Pt state that his pain in his upper back is really uncomfortable. His pain medication is not relieving the pain completely. He staets that he has noticed headaches as well. He states that he has the facial numbness and tingling down his leg. He states that he has not eaten solid food in 5 days. Denies any diarrhea. Denies any new pains. Had not noticed any recent bleeding such as epistaxis, hematuria or hematochezia. Denies recent chest pain on exertion, shortness of breath on minimal exertion, pre-syncopal episodes, or palpitations. Denies any numbness or tingling in hands or feet. Denies any recent  fevers, infections, or recent hospitalizations. Patient reports appetite at 0% and energy level at 0%.    REVIEW OF SYSTEMS:  Review of Systems  Constitutional: Positive for fatigue.  Gastrointestinal: Positive for nausea and vomiting.  Musculoskeletal: Positive for gait problem.    Neurological: Positive for gait problem.     PAST MEDICAL/SURGICAL HISTORY:  Past Medical History:  Diagnosis Date   Acid reflux    Alcoholism ([place])    Arthritis    Bony metastasis ([facility])    Chronic back pain    Diverticulosis    Family history of colon cancer    Fatty liver    Gynecomastia, male 01/11/2013   Secondary to prostate ca Tx.    H/O Bell's palsy    Hemorrhoids    history   High cholesterol    History of back injury 11/19/2013   Hyperglycemia    Hyperlipidemia    Hypertension    Prostate cancer [facility])    Prostate cancer ([place]) 06/20/2011   Prostate carcinoma, recurrent ([place]) 08/10/2012   Past Surgical History:  Procedure Laterality Date   ANKLE SURGERY  1987   left   COLONOSCOPY  2008   CYSTOSCOPY WITH INSERTION OF UROLIFT N/A 10/16/2018   Procedure: CYSTOSCOPY WITH INSERTION OF UROLIFT;  Surgeon: Franchot Gallo, MD;  Location: [facility];  Service: Urology;  Laterality: N/A;   ESOPHAGOGASTRODUODENOSCOPY     HAND SURGERY     nerve was torn - left hand   HERNIA REPAIR  2010   I&D EXTREMITY Left 06/10/2015   Procedure: IRRIGATION AND DEBRIDEMENT EXTREMITY LEFT HAND EXPLORATION, nerve repair;  Surgeon: Charlotte Crumb, MD;  Location: [place];  Service: Orthopedics;  Laterality: Left;   KNEE SURGERY     left   PROSTATE BIOPSY  11/06     SOCIAL HISTORY:  Social History   Socioeconomic History   Marital status: Married    Spouse name: Not on file   Number of children: 4   Years of education: Not on file   Highest education level: GED or equivalent  Occupational History   Occupation: Disabled  Scientist, product/process development strain: Not very hard   Food insecurity:    Worry: Sometimes true    Inability: Sometimes true   Transportation needs:    Medical: No    Non-medical: No  Tobacco Use   Smoking status: Former Smoker    Packs/day: 2.50    Years: 3.00    Pack years: 7.50   Smokeless tobacco: Never Used   Substance and Sexual Activity   Alcohol use: No    Comment: former drinker 20 years ago   Drug use: No   Sexual activity: Not Currently  Lifestyle   Physical activity:    Days per week: 0 days    Minutes per session: 0 min   Stress: Not at all  Relationships   Social connections:    Talks on phone: More than three times a week    Gets together: More than three times a week    Attends religious service: Not on file    Active member of club or organization: Not on file    Attends meetings of clubs or organizations: Not on file    Relationship status: Married   Intimate partner violence:    Fear of current or ex partner: No    Emotionally abused: No    Physically abused: No    Forced sexual activity: No  Other Topics Concern   Not  on file  Social History Narrative   Not on file    FAMILY HISTORY:  Family History  Problem Relation Age of Onset   Heart failure Mother    Diabetes Father    Cancer Sister    Diabetes Brother    Cancer Sister        ? colon cancer; d. in her 79s   Muscular dystrophy Sister    Pneumonia Brother        d. 69 mo   Lung cancer Nephew     CURRENT MEDICATIONS:  Outpatient Encounter Medications as of 02/28/2019  Medication Sig Note   apalutamide (ERLEADA) 60 MG tablet Take 4 tablets (240 mg total) by mouth daily. May be taken with or without food. Swallow tablets whole.    Calcium-Magnesium-Vitamin D (CALCIUM 1200+D3 PO) Take 1 tablet by mouth daily.     Cholecalciferol 50 MCG (2000 UT) CHEW Chew 1 tablet by mouth.    denosumab (XGEVA) 120 MG/1.7ML SOLN injection Inject 120 mg into the skin once. 12/28/2018: Next shot due 01/13/2019   diclofenac sodium (VOLTAREN) 1 % GEL Apply 2 g topically 4 (four) times daily.    ferrous sulfate (FERROUSUL) 325 (65 FE) MG tablet Take 1 tablet (325 mg total) by mouth daily with breakfast.    HYDROcodone-acetaminophen (NORCO) 10-325 MG tablet Take 1 tablet by mouth every 8 (eight) hours as  needed.    Leuprolide Acetate ([name] IJ) Inject 1 application as directed every 6 (six) months.     Menthol-Methyl Salicylate (MUSCLE RUB) 10-15 % CREA Apply 1 application topically daily as needed for muscle pain.     Nutritional Supplements (EQUATE PLUS) LIQD Take 1 Can by mouth 2 (two) times daily.    OVER THE COUNTER MEDICATION See admin instructions. preggie pop drops - take 1 lozenge as needed.     oxyCODONE-acetaminophen (PERCOCET) 10-325 MG tablet Take 1 tablet by mouth every 6 (six) hours as needed for pain.    promethazine (PHENERGAN) 25 MG tablet Take 1 tablet (25 mg total) by mouth every 6 (six) hours as needed for nausea or vomiting.    scopolamine (TRANSDERM-SCOP) 1 MG/3DAYS Place 1 patch (1.5 mg total) onto the skin every 3 (three) days.    traMADol (ULTRAM) 50 MG tablet Take 2 tablets (100 mg total) by mouth every 4 (four) hours as needed.    fentaNYL (DURAGESIC) 25 MCG/HR Place 1 patch onto the skin every 3 (three) days.    prochlorperazine (COMPAZINE) 10 MG tablet Take 1 tablet (10 mg total) by mouth every 6 (six) hours as needed for nausea or vomiting.    No facility-administered encounter medications on file as of 02/28/2019.     ALLERGIES:  No Known Allergies   PHYSICAL EXAM:  ECOG Performance status: 3  Vitals:   02/28/19 1053  BP: 134/64  Pulse: 86  Resp: 18  Temp: 98.2 F (36.8 C)  SpO2: 100%   Filed Weights   02/28/19 1053  Weight: 157 lb 6.4 oz (71.4 kg)    Physical Exam Constitutional:      Appearance: Normal appearance.  Cardiovascular:     Rate and Rhythm: Normal rate and regular rhythm.     Heart sounds: Normal heart sounds.  Pulmonary:     Effort: Pulmonary effort is normal.     Breath sounds: Normal breath sounds.  Abdominal:     Palpations: Abdomen is soft. There is no mass.  Musculoskeletal:     Left lower leg: No edema.  Skin:    General: Skin is warm.  Neurological:     General: No focal deficit present.     Mental  Status: He is alert and oriented to person, place, and time.  Psychiatric:        Mood and Affect: Mood normal.        Behavior: Behavior normal.      LABORATORY DATA:  I have reviewed the labs as listed.  CBC    Component Value Date/Time   WBC 5.3 02/28/2019 0945   RBC 2.66 (L) 02/28/2019 0945   HGB 8.0 (L) 02/28/2019 0945   HGB 8.5 (LL) 01/11/2019 1437   HCT 26.1 (L) 02/28/2019 0945   HCT 24.4 (L) 01/11/2019 1437   PLT 162 02/28/2019 0945   PLT 201 01/11/2019 1437   MCV 98.1 02/28/2019 0945   MCV 88 01/11/2019 1437   MCH 30.1 02/28/2019 0945   MCHC 30.7 02/28/2019 0945   RDW 16.3 (H) 02/28/2019 0945   RDW 17.1 (H) 01/11/2019 1437   LYMPHSABS 1.4 02/28/2019 0945   LYMPHSABS 1.3 01/11/2019 1437   MONOABS 0.5 02/28/2019 0945   EOSABS 0.1 02/28/2019 0945   EOSABS 0.1 01/11/2019 1437   BASOSABS 0.1 02/28/2019 0945   BASOSABS 0.0 01/11/2019 1437   CMP Latest Ref Rng & Units 02/28/2019 02/19/2019 02/14/2019  Glucose 70 - 99 mg/dL 123(H) 128(H) 127(H)  BUN 8 - 23 mg/dL 21 29(H) 29(H)  Creatinine 0.61 - 1.24 mg/dL 0.94 0.92 0.95  Sodium 135 - 145 mmol/L 140 136 137  Potassium 3.5 - 5.1 mmol/L 3.7 4.6 3.9  Chloride 98 - 111 mmol/L 106 104 106  CO2 22 - 32 mmol/L 23 24 21(L)  Calcium 8.9 - 10.3 mg/dL 9.1 9.1 8.7(L)  Total Protein 6.5 - 8.1 g/dL 7.0 7.2 7.4  Total Bilirubin 0.3 - 1.2 mg/dL 0.7 0.5 0.5  Alkaline Phos 38 - 126 U/L 128(H) 142(H) 154(H)  AST 15 - 41 U/L 31 32 30  ALT 0 - 44 U/L 16 11 13        DIAGNOSTIC IMAGING:  I have independently reviewed the scans and discussed with the patient.   I have reviewed Venita Lick LPN's note and agree with the documentation.  I personally performed a face-to-face visit, made revisions and my assessment and plan is as follows.    ASSESSMENT & PLAN:   Primary prostate cancer with metastasis from prostate to other site [facility]) 1.  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 - Zytiga and prednisone started in January 2019. -Last bone scan and CT scan on 04/27/2018 shows worsening bone metastasis with no visceral metastasis. - He self discontinue it secondary to side effects.  He was started back at low dose of 1 mg daily and discontinued it in August 2019 secondary to intolerance from nausea and severe tiredness.  -Bone marrow biopsy on 10/11/2018 consistent with metastatic adenocarcinoma infiltration. -UroLift procedure was done on 10/16/2018 which is helping his urine flow.  He does not have to self catheterize anymore. -Enzalutamide was started on 10/22/2018, increased to 4 tablets daily, self discontinued in January 2020. - He apparently self discontinued enzalutamide in January 2020 as he is not tolerating it well.  His main complaint was feeling of nausea all the time.  He tried taking Compazine but did not help. -He  was hospitalized from 12/28/2018 through 12/29/2018 and received 3 units of blood transfusion for hemoglobin of 5.2. - He was hospitalized again on 01/29/2019 through 01/30/2019 for hemoglobin of 7.2 and received 2 units of PRBC.  Hemoglobin at discharge was 8.9 last week. - His PSA came down from 254 (09/25/2018) to 153 (12/28/2018). - We started him back on [name] 22.5 mg at last visit on 02/06/2019. -I have recommended active treatment for his prostate cancer.  Patient is reluctant to consider chemotherapy.  I have recommended apalutamide  240 mg daily.  We talked about side effects in detail. -MRI done on 02/19/2019 was motion degraded, prematurely truncated demonstrating multiple suspected tumor deposits in the epidural space over both cerebral hemispheres.  Base of skull was not visualized. - He was started on apalutamide on 02/19/2019. -He reports falls couple of times yesterday. - I have told him  to discontinue apalutamide at this time as it can cause falls. - CT of the head with and without contrast on 02/21/2019 shows multiple enhancing extra-axial masses bilaterally surrounding both cerebral hemispheres in the frontal and parietal and temporal lobes compatible with metastatic disease to the bone and dura.  Largest lesion in the left frontal lobe measures 20 x 19 mm.  No significant brain edema. -He has an appointment to see Dr.Yanagihara tomorrow for consideration of radiation.  He does report some headaches lately. -I have reviewed his blood work today.  Hemoglobin is 8.  He does not require any transfusion. - His functional status is declining lately.  We will consider hospice evaluation pending radiation consultation.  2.  Bone metastasis: - We have resumed his Xgeva on 12/13/2018. -He will continue calcium and vitamin D supplements.    3.  Family history: - Sister died of metastatic cancer.  Primary is unknown to the patient.  Another sister died of colon cancer. -Testing for common hereditary mutations was negative.  4.  Cancer related pain: -He complains of pain in the upper back mostly towards the left side. -He is taking Percocet 10 mg every 4 hours which is only helping for 2 hours. -I will start him on fentanyl 25 mcg patch.  He will continue Percocet as needed for breakthrough pain.  We will also start him on Compazine as needed for nausea.  He is already on scopolamine patch.   Total time spent is 40 minutes with more than 50% of the time spent face-to-face discussing scan results, lab results, further plan of action and coordination of care.    Orders placed this encounter:  Orders Placed This Encounter  Procedures   NM Bone Scan Whole Body      [name], MD [place] [phone]

## 2019-02-28 NOTE — Telephone Encounter (Signed)
Per email from Lendell Caprice, patient will not be taking Erleada.

## 2019-02-28 NOTE — Patient Instructions (Addendum)
[place] at [facility] Discharge Instructions  You were seen today by Dr. Delton Coombes. He went over your recent lab results. He wants you to stop taking the Erleada. He will change your pain medication to a Fentanyl patch it will need to be changed every 3 days. Use the percocet as needed for breakthrough pain. He will see you back for a bone scan and then after for labs and follow up.   Thank you for choosing [place] at [facility] to provide your oncology and hematology care.  To afford each patient quality time with our provider, please arrive at least 15 minutes before your scheduled appointment time.   If you have a lab appointment with the [place] please come in thru the  Main Entrance and check in at the main information desk  You need to re-schedule your appointment should you arrive 10 or more minutes late.  We strive to give you quality time with our providers, and arriving late affects you and other patients whose appointments are after yours.  Also, if you no show three or more times for appointments you may be dismissed from the clinic at the providers discretion.     Again, thank you for choosing [facility].  Our hope is that these requests will decrease the amount of time that you wait before being seen by our physicians.       _____________________________________________________________  Should you have questions after your visit to [facility], please contact our office at [phone] between the hours of 8:00 a.m. and 4:30 p.m.  Voicemails left after 4:00 p.m. will not be returned until the following business day.  For prescription refill requests, have your pharmacy contact our office and allow 72 hours.    Cancer Center Support Programs:   > Cancer Support Group  2nd Tuesday of the month 1pm-2pm, Journey Room

## 2019-02-28 NOTE — Assessment & Plan Note (Signed)
1.  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 - Zytiga and prednisone started in January 2019. -Last bone scan and CT scan on 04/27/2018 shows worsening bone metastasis with no visceral metastasis. - He self discontinue it secondary to side effects.  He was started back at low dose of 1 mg daily and discontinued it in August 2019 secondary to intolerance from nausea and severe tiredness.  -Bone marrow biopsy on 10/11/2018 consistent with metastatic adenocarcinoma infiltration. -UroLift procedure was done on 10/16/2018 which is helping his urine flow.  He does not have to self catheterize anymore. -Enzalutamide was started on 10/22/2018, increased to 4 tablets daily, self discontinued in January 2020. - He apparently self discontinued enzalutamide in January 2020 as he is not tolerating it well.  His main complaint was feeling of nausea all the time.  He tried taking Compazine but did not help. -He was hospitalized from 12/28/2018 through 12/29/2018 and received 3 units of blood transfusion for hemoglobin of 5.2. - He was hospitalized again on 01/29/2019 through 01/30/2019 for hemoglobin of 7.2 and received 2 units of PRBC.  Hemoglobin at discharge was 8.9 last week. - His PSA came down from 254 (09/25/2018) to 153 (12/28/2018). - We started him back on [name] 22.5 mg at last visit on 02/06/2019. -I have recommended active treatment for his prostate cancer.  Patient is reluctant to consider chemotherapy.  I have recommended apalutamide  240 mg daily.  We talked about side effects in detail. -MRI done on 02/19/2019 was motion degraded, prematurely truncated demonstrating multiple suspected tumor deposits in the epidural space over both cerebral hemispheres.  Base of skull was not visualized. - He was  started on apalutamide on 02/19/2019. -He reports falls couple of times yesterday. - I have told him to discontinue apalutamide at this time as it can cause falls. - CT of the head with and without contrast on 02/21/2019 shows multiple enhancing extra-axial masses bilaterally surrounding both cerebral hemispheres in the frontal and parietal and temporal lobes compatible with metastatic disease to the bone and dura.  Largest lesion in the left frontal lobe measures 20 x 19 mm.  No significant brain edema. -He has an appointment to see Dr.Yanagihara tomorrow for consideration of radiation.  He does report some headaches lately. -I have reviewed his blood work today.  Hemoglobin is 8.  He does not require any transfusion. - His functional status is declining lately.  We will consider hospice evaluation pending radiation consultation.  2.  Bone metastasis: - We have resumed his Xgeva on 12/13/2018. -He will continue calcium and vitamin D supplements.    3.  Family history: - Sister died of metastatic cancer.  Primary is unknown to the patient.  Another sister died of colon cancer. -Testing for common hereditary mutations was negative.  4.  Cancer related pain: -He complains of pain in the upper back mostly towards the left side. -He is taking Percocet 10 mg every 4 hours which is only helping for 2 hours. -I will start him on fentanyl 25 mcg patch.  He will continue Percocet as needed for breakthrough pain.  We will also start him on Compazine as needed for nausea.  He is already on scopolamine patch.

## 2019-03-01 NOTE — Telephone Encounter (Signed)
Walter Boone was discontinued.

## 2019-03-04 ENCOUNTER — Encounter (HOSPITAL_COMMUNITY): Payer: Medicare Other

## 2019-03-04 ENCOUNTER — Other Ambulatory Visit (HOSPITAL_COMMUNITY): Payer: Self-pay

## 2019-03-04 DIAGNOSIS — C7951 Secondary malignant neoplasm of bone: Secondary | ICD-10-CM

## 2019-03-04 DIAGNOSIS — C61 Malignant neoplasm of prostate: Secondary | ICD-10-CM

## 2019-03-05 ENCOUNTER — Other Ambulatory Visit (HOSPITAL_COMMUNITY): Payer: Self-pay

## 2019-03-05 ENCOUNTER — Inpatient Hospital Stay (HOSPITAL_COMMUNITY): Payer: Medicare Other | Admitting: Hematology

## 2019-03-08 ENCOUNTER — Encounter (HOSPITAL_COMMUNITY)
Admission: RE | Admit: 2019-03-08 | Discharge: 2019-03-08 | Disposition: A | Discharge status: Home / Self Care | Payer: Medicare Other | Source: Ambulatory Visit | Attending: Hematology | Admitting: Hematology

## 2019-03-08 ENCOUNTER — Other Ambulatory Visit: Payer: Self-pay

## 2019-03-08 DIAGNOSIS — C61 Malignant neoplasm of prostate: Secondary | ICD-10-CM | POA: Insufficient documentation

## 2019-03-08 MED ORDER — TECHNETIUM TC 99M MEDRONATE IV KIT
20.0000 | PACK | Freq: Once | INTRAVENOUS | Status: AC | PRN
  Administered 2019-03-08: 20.5 via INTRAVENOUS

## 2019-03-11 ENCOUNTER — Other Ambulatory Visit: Payer: Self-pay

## 2019-03-12 ENCOUNTER — Other Ambulatory Visit (HOSPITAL_COMMUNITY): Payer: Self-pay

## 2019-03-12 ENCOUNTER — Other Ambulatory Visit: Payer: Self-pay

## 2019-03-12 ENCOUNTER — Encounter (HOSPITAL_COMMUNITY): Payer: Self-pay | Admitting: Hematology

## 2019-03-12 ENCOUNTER — Inpatient Hospital Stay (HOSPITAL_COMMUNITY): Payer: Medicare Other

## 2019-03-12 ENCOUNTER — Inpatient Hospital Stay (HOSPITAL_BASED_OUTPATIENT_CLINIC_OR_DEPARTMENT_OTHER): Payer: Medicare Other | Admitting: Hematology

## 2019-03-12 DIAGNOSIS — Z79818 Long term (current) use of other agents affecting estrogen receptors and estrogen levels: Secondary | ICD-10-CM

## 2019-03-12 DIAGNOSIS — C61 Malignant neoplasm of prostate: Secondary | ICD-10-CM

## 2019-03-12 DIAGNOSIS — M1990 Unspecified osteoarthritis, unspecified site: Secondary | ICD-10-CM

## 2019-03-12 DIAGNOSIS — Z9221 Personal history of antineoplastic chemotherapy: Secondary | ICD-10-CM

## 2019-03-12 DIAGNOSIS — D649 Anemia, unspecified: Secondary | ICD-10-CM

## 2019-03-12 DIAGNOSIS — Z79899 Other long term (current) drug therapy: Secondary | ICD-10-CM

## 2019-03-12 DIAGNOSIS — Z800 Family history of malignant neoplasm of digestive organs: Secondary | ICD-10-CM

## 2019-03-12 DIAGNOSIS — F1011 Alcohol abuse, in remission: Secondary | ICD-10-CM

## 2019-03-12 DIAGNOSIS — Z87891 Personal history of nicotine dependence: Secondary | ICD-10-CM

## 2019-03-12 DIAGNOSIS — C7951 Secondary malignant neoplasm of bone: Secondary | ICD-10-CM

## 2019-03-12 DIAGNOSIS — R112 Nausea with vomiting, unspecified: Secondary | ICD-10-CM

## 2019-03-12 DIAGNOSIS — G893 Neoplasm related pain (acute) (chronic): Secondary | ICD-10-CM

## 2019-03-12 DIAGNOSIS — R5383 Other fatigue: Secondary | ICD-10-CM

## 2019-03-12 DIAGNOSIS — E785 Hyperlipidemia, unspecified: Secondary | ICD-10-CM

## 2019-03-12 DIAGNOSIS — Z9181 History of falling: Secondary | ICD-10-CM

## 2019-03-12 DIAGNOSIS — G8929 Other chronic pain: Secondary | ICD-10-CM

## 2019-03-12 DIAGNOSIS — K760 Fatty (change of) liver, not elsewhere classified: Secondary | ICD-10-CM

## 2019-03-12 DIAGNOSIS — R269 Unspecified abnormalities of gait and mobility: Secondary | ICD-10-CM

## 2019-03-12 DIAGNOSIS — R42 Dizziness and giddiness: Secondary | ICD-10-CM

## 2019-03-12 DIAGNOSIS — R200 Anesthesia of skin: Secondary | ICD-10-CM

## 2019-03-12 DIAGNOSIS — I10 Essential (primary) hypertension: Secondary | ICD-10-CM

## 2019-03-12 DIAGNOSIS — K5900 Constipation, unspecified: Secondary | ICD-10-CM

## 2019-03-12 LAB — COMPREHENSIVE METABOLIC PANEL
ALT: 11 U/L (ref 0–44)
AST: 23 U/L (ref 15–41)
Albumin: 3.6 g/dL (ref 3.5–5.0)
Alkaline Phosphatase: 150 U/L — ABNORMAL HIGH (ref 38–126)
Anion gap: 10 (ref 5–15)
BUN: 27 mg/dL — ABNORMAL HIGH (ref 8–23)
CHLORIDE: 106 mmol/L (ref 98–111)
CO2: 23 mmol/L (ref 22–32)
Calcium: 9 mg/dL (ref 8.9–10.3)
Creatinine, Ser: 1.1 mg/dL (ref 0.61–1.24)
GFR calc Af Amer: >60 mL/min (ref >60)
Glucose, Bld: 117 mg/dL — ABNORMAL HIGH (ref 70–99)
Potassium: 3.9 mmol/L (ref 3.5–5.1)
Sodium: 139 mmol/L (ref 135–145)
Total Bilirubin: 0.7 mg/dL (ref 0.3–1.2)
Total Protein: 6.9 g/dL (ref 6.5–8.1)

## 2019-03-12 LAB — CBC WITH DIFFERENTIAL/PLATELET
Abs Immature Granulocytes: 0.68 10*3/uL — ABNORMAL HIGH (ref 0.00–0.07)
Basophils Absolute: 0 10*3/uL (ref 0.0–0.1)
Basophils Relative: 1 %
Eosinophils Absolute: 0.1 10*3/uL (ref 0.0–0.5)
Eosinophils Relative: 1 %
HCT: 22.7 % — ABNORMAL LOW (ref 39.0–52.0)
Hemoglobin: 7.1 g/dL — ABNORMAL LOW (ref 13.0–17.0)
Immature Granulocytes: 13 %
LYMPHS PCT: 18 %
Lymphs Abs: 1 10*3/uL (ref 0.7–4.0)
MCH: 30.9 pg (ref 26.0–34.0)
MCHC: 31.3 g/dL (ref 30.0–36.0)
MCV: 98.7 fL (ref 80.0–100.0)
Monocytes Absolute: 0.5 10*3/uL (ref 0.1–1.0)
Monocytes Relative: 10 %
NRBC: 4.3 % — AB (ref 0.0–0.2)
Neutro Abs: 3.1 10*3/uL (ref 1.7–7.7)
Neutrophils Relative %: 57 %
PLATELETS: 168 10*3/uL (ref 150–400)
RBC: 2.3 MIL/uL — ABNORMAL LOW (ref 4.22–5.81)
RDW: 17.2 % — ABNORMAL HIGH (ref 11.5–15.5)
WBC: 5.3 10*3/uL (ref 4.0–10.5)

## 2019-03-12 LAB — SAMPLE TO BLOOD BANK

## 2019-03-12 NOTE — Patient Instructions (Addendum)
[place] at [facility] Discharge Instructions  You were seen today by Dr. Delton Coombes. He went over your recent lab results, your hemoglobin is 7.1 today. He will schedule you for a blood transfusion. He discussed your options moving forward, and stopping the pills, he discussed not being able to treat your cancer anymore with chemotherapy. He discussed Hospice with you and offered to refer you there. He will see you back in 2 weeks for labs and follow up.   Thank you for choosing [place] at [facility] to provide your oncology and hematology care.  To afford each patient quality time with our provider, please arrive at least 15 minutes before your scheduled appointment time.   If you have a lab appointment with the [place] please come in thru the  Main Entrance and check in at the main information desk  You need to re-schedule your appointment should you arrive 10 or more minutes late.  We strive to give you quality time with our providers, and arriving late affects you and other patients whose appointments are after yours.  Also, if you no show three or more times for appointments you may be dismissed from the clinic at the providers discretion.     Again, thank you for choosing [facility].  Our hope is that these requests will decrease the amount of time that you wait before being seen by our physicians.       _____________________________________________________________  Should you have questions after your visit to [facility], please contact our office at [phone] between the hours of 8:00 a.m. and 4:30 p.m.  Voicemails left after 4:00 p.m. will not be returned until the following business day.  For prescription refill requests, have your pharmacy contact our office and allow 72 hours.    Cancer Center Support Programs:   > Cancer Support Group  2nd Tuesday of the month 1pm-2pm, Journey Room

## 2019-03-12 NOTE — Progress Notes (Signed)
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     03/16/2016 Imaging    CT CAP- No evidence of metastatic disease within the chest, abdomen, or pelvis. No other acute findings identified.    06/17/2016 Imaging    Bone scan- 1. Negative for metastatic pattern.    07/11/2016 Miscellaneous    Prolia every 6 months for osteopenia.    09/12/2016 -  Chemotherapy    Casodex/Depo-[name] every 3 months     09/27/2016 Imaging    CT CAP -No acute findings and no evidence for metastatic disease within the chest, abdomen or pelvis.    11/13/2017 Progression    Bone Scan: Multiple new abnormal sites of increased osseous tracer accumulation are identified as above consistent with osseous metastatic disease.  These include new foci of uptake at the proximal femora bilaterally.    11/16/2017 Progression    CT CAP-IMPRESSION: 1. New enlarged right pelvic sidewall lymph node and borderline left periaortic lymph node worrisome for metastatic adenopathy. 2. Subtle areas of sclerosis within the lumbar spine are more conspicuous than on the previous exam and worrisome for metastatic disease. Overall the areas of bone metastasis are more better demonstrated on bone scan from 11/13/2017 which demonstrate a progression of bone metastasis.    02/08/2019 Genetic Testing    Negative genetic testing on the common hereditary cancer panel.  The Hereditary Gene Panel offered by [facility]  includes sequencing and/or deletion duplication testing of the following 47 genes: APC, ATM, AXIN2, BARD1, BMPR1A, BRCA1, BRCA2, BRIP1, CDH1, CDK4, CDKN2A (p14ARF), CDKN2A (p16INK4a), CHEK2, CTNNA1, DICER1, EPCAM (Deletion/duplication testing only), GREM1 (promoter region deletion/duplication testing only), KIT, MEN1, MLH1, MSH2, MSH3, MSH6, MUTYH, NBN, NF1, NHTL1, PALB2, PDGFRA, PMS2, POLD1, POLE, PTEN, RAD50, RAD51C, RAD51D, SDHB, SDHC, SDHD, SMAD4, SMARCA4. STK11, TP53, TSC1, TSC2, and VHL.  The following genes were evaluated for sequence changes only: SDHA and HOXB13 c.251G>A variant only. The report date is February 08, 2019.      CANCER STAGING: Cancer Staging Primary prostate cancer with metastasis from prostate to other site [facility]) Staging form: Prostate, AJCC 7th Edition - Clinical: Stage IIC (pT2c, N0, M0) - Signed by Baird Cancer, PA on 06/20/2011    INTERVAL HISTORY:  Mr. Walter Boone 68 y.o. male returns for routine follow-up. He is here today alone. He states that he has constipation ans nausea. He states that he is still having pain in his left shoulder. He stats that he feels really weak. He states that the numbnees in his face has gotten a little better. Denies any nausea, vomiting, or diarrhea. Denies any new pains. Had not noticed any recent bleeding such as epistaxis, hematuria or hematochezia. Denies recent chest pain on exertion, shortness of breath on minimal exertion, pre-syncopal episodes, or palpitations. Denies any numbness or tingling in hands or feet. Denies any recent fevers, infections, or recent hospitalizations. Patient reports appetite at 0% and energy level at 0%.  REVIEW OF SYSTEMS:  Review of  Systems  Gastrointestinal: Positive for constipation and nausea.  Musculoskeletal: Positive for back pain.  All other systems reviewed and are negative.    PAST MEDICAL/SURGICAL HISTORY:  Past Medical History:  Diagnosis Date  . Acid reflux   . Alcoholism ([place])   .  Arthritis   . Bony metastasis ([place])   . Chronic back pain   . Diverticulosis   . Family history of colon cancer   . Fatty liver   . Gynecomastia, male 01/11/2013   Secondary to prostate ca Tx.   . H/O Bell's palsy   . Hemorrhoids    history  . High cholesterol   . History of back injury 11/19/2013  . Hyperglycemia   . Hyperlipidemia   . Hypertension   . Prostate cancer ([place])   . Prostate cancer ([place]) 06/20/2011  . Prostate carcinoma, recurrent ([place]) 08/10/2012   Past Surgical History:  Procedure Laterality Date  . [place]   left  . COLONOSCOPY  2008  . CYSTOSCOPY WITH INSERTION OF UROLIFT N/A 10/16/2018   Procedure: CYSTOSCOPY WITH INSERTION OF UROLIFT;  Surgeon: Franchot Gallo, MD;  Location: [facility];  Service: Urology;  Laterality: N/A;  . ESOPHAGOGASTRODUODENOSCOPY    . HAND SURGERY     nerve was torn - left hand  . HERNIA REPAIR  2010  . I&D EXTREMITY Left 06/10/2015   Procedure: IRRIGATION AND DEBRIDEMENT EXTREMITY LEFT HAND EXPLORATION, nerve repair;  Surgeon: Charlotte Crumb, MD;  Location: [facility];  Service: Orthopedics;  Laterality: Left;  . KNEE SURGERY     left  . PROSTATE BIOPSY  11/06     SOCIAL HISTORY:  Social History   Socioeconomic History  . Marital status: Married    Spouse name: Not on file  . Number of children: 4  . Years of education: Not on file  . Highest education level: GED or equivalent  Occupational History  . Occupation: Disabled  Social Needs  . Financial resource strain: Not very hard  . Food insecurity:    Worry: Sometimes true    Inability: Sometimes true  . Transportation needs:    Medical: No    Non-medical: No  Tobacco Use  . Smoking status: Former Smoker    Packs/day: 2.50    Years: 3.00    Pack years: 7.50  . Smokeless tobacco: Never Used  Substance and Sexual Activity  . Alcohol use: No    Comment: former drinker 20 years ago  . Drug use: No  . Sexual activity: Not Currently  Lifestyle  . Physical  activity:    Days per week: 0 days    Minutes per session: 0 min  . Stress: Not at all  Relationships  . Social connections:    Talks on phone: More than three times a week    Gets together: More than three times a week    Attends religious service: Not on file    Active member of club or organization: Not on file    Attends meetings of clubs or organizations: Not on file    Relationship status: Married  . Intimate partner violence:    Fear of current or ex partner: No    Emotionally abused: No    Physically abused: No    Forced sexual activity: No  Other Topics Concern  . Not on file  Social History Narrative  . Not on file    FAMILY HISTORY:  Family History  Problem Relation Age of Onset  . Heart failure  Mother   . Diabetes Father   . Cancer Sister   . Diabetes Brother   . Cancer Sister        ? colon cancer; d. in her 31s  . Muscular dystrophy Sister   . Pneumonia Brother        d. 11 mo  . Lung cancer Nephew     CURRENT MEDICATIONS:  Outpatient Encounter Medications as of 03/12/2019  Medication Sig Note  . apalutamide (ERLEADA) 60 MG tablet Take 4 tablets (240 mg total) by mouth daily. May be taken with or without food. Swallow tablets whole.   . Calcium-Magnesium-Vitamin D (CALCIUM 1200+D3 PO) Take 1 tablet by mouth daily.    . Cholecalciferol 50 MCG (2000 UT) CHEW Chew 1 tablet by mouth.   . diclofenac sodium (VOLTAREN) 1 % GEL Apply 2 g topically 4 (four) times daily.   . fentaNYL (DURAGESIC) 25 MCG/HR Place 1 patch onto the skin every 3 (three) days.   . fentaNYL (DURAGESIC) 25 MCG/HR Place onto the skin.   . ferrous sulfate (FERROUSUL) 325 (65 FE) MG tablet Take 1 tablet (325 mg total) by mouth daily with breakfast.   . HYDROcodone-acetaminophen (NORCO) 10-325 MG tablet Take 1 tablet by mouth every 8 (eight) hours as needed.   Marland Kitchen HYDROcodone-acetaminophen (NORCO) 10-325 MG tablet Take by mouth.   . Menthol-Methyl Salicylate (MUSCLE RUB) 10-15 % CREA Apply 1  application topically daily as needed for muscle pain.    . Nutritional Supplements (EQUATE PLUS) LIQD Take 1 Can by mouth 2 (two) times daily.   Marland Kitchen OVER THE COUNTER MEDICATION See admin instructions. preggie pop drops - take 1 lozenge as needed.    Marland Kitchen oxyCODONE-acetaminophen (PERCOCET) 10-325 MG tablet Take 1 tablet by mouth every 6 (six) hours as needed for pain.   Marland Kitchen oxyCODONE-acetaminophen (PERCOCET) 10-325 MG tablet Take by mouth.   . prochlorperazine (COMPAZINE) 10 MG tablet Take 1 tablet (10 mg total) by mouth every 6 (six) hours as needed for nausea or vomiting.   . prochlorperazine (COMPAZINE) 10 MG tablet Take by mouth.   . promethazine (PHENERGAN) 25 MG tablet Take 1 tablet (25 mg total) by mouth every 6 (six) hours as needed for nausea or vomiting.   . promethazine (PHENERGAN) 25 MG tablet Take by mouth.   Marland Kitchen scopolamine (TRANSDERM-SCOP) 1 MG/3DAYS Place 1 patch (1.5 mg total) onto the skin every 3 (three) days.   Marland Kitchen scopolamine (TRANSDERM-SCOP) 1 MG/3DAYS Place onto the skin.   Marland Kitchen traMADol (ULTRAM) 50 MG tablet Take 2 tablets (100 mg total) by mouth every 4 (four) hours as needed.   Marland Kitchen denosumab (XGEVA) 120 MG/1.7ML SOLN injection Inject 120 mg into the skin once. 12/28/2018: Next shot due 01/13/2019  . Leuprolide Acetate ([name] IJ) Inject 1 application as directed every 6 (six) months.    . Menthol-Methyl Salicylate (CALYPXO) 8-92 % CREA Apply topically.    No facility-administered encounter medications on file as of 03/12/2019.     ALLERGIES:  No Known Allergies   PHYSICAL EXAM:  ECOG Performance status: 3  Vitals:   03/12/19 1047  BP: (!) 116/58  Pulse: 82  Resp: 18  Temp: 98.5 F (36.9 C)  SpO2: 98%   Filed Weights   03/12/19 1047  Weight: 149 lb 4.8 oz (67.7 kg)    Physical Exam Constitutional:      Appearance: Normal appearance.  Cardiovascular:     Rate and Rhythm: Normal rate and regular rhythm.     Heart sounds:  Normal heart sounds.  Pulmonary:     Effort:  Pulmonary effort is normal.     Breath sounds: Normal breath sounds.  Abdominal:     General: There is no distension.     Palpations: Abdomen is soft.  Musculoskeletal:        General: No swelling.  Skin:    General: Skin is warm.  Neurological:     General: No focal deficit present.     Mental Status: He is alert and oriented to person, place, and time.  Psychiatric:        Mood and Affect: Mood normal.        Behavior: Behavior normal.      LABORATORY DATA:  I have reviewed the labs as listed.  CBC    Component Value Date/Time   WBC 5.3 03/12/2019 0947   RBC 2.30 (L) 03/12/2019 0947   HGB 7.1 (L) 03/12/2019 0947   HGB 8.5 (LL) 01/11/2019 1437   HCT 22.7 (L) 03/12/2019 0947   HCT 24.4 (L) 01/11/2019 1437   PLT 168 03/12/2019 0947   PLT 201 01/11/2019 1437   MCV 98.7 03/12/2019 0947   MCV 88 01/11/2019 1437   MCH 30.9 03/12/2019 0947   MCHC 31.3 03/12/2019 0947   RDW 17.2 (H) 03/12/2019 0947   RDW 17.1 (H) 01/11/2019 1437   LYMPHSABS 1.0 03/12/2019 0947   LYMPHSABS 1.3 01/11/2019 1437   MONOABS 0.5 03/12/2019 0947   EOSABS 0.1 03/12/2019 0947   EOSABS 0.1 01/11/2019 1437   BASOSABS 0.0 03/12/2019 0947   BASOSABS 0.0 01/11/2019 1437   CMP Latest Ref Rng & Units 03/12/2019 02/28/2019 02/19/2019  Glucose 70 - 99 mg/dL 117(H) 123(H) 128(H)  BUN 8 - 23 mg/dL 27(H) 21 29(H)  Creatinine 0.61 - 1.24 mg/dL 1.10 0.94 0.92  Sodium 135 - 145 mmol/L 139 140 136  Potassium 3.5 - 5.1 mmol/L 3.9 3.7 4.6  Chloride 98 - 111 mmol/L 106 106 104  CO2 22 - 32 mmol/L 23 23 24   Calcium 8.9 - 10.3 mg/dL 9.0 9.1 9.1  Total Protein 6.5 - 8.1 g/dL 6.9 7.0 7.2  Total Bilirubin 0.3 - 1.2 mg/dL 0.7 0.7 0.5  Alkaline Phos 38 - 126 U/L 150(H) 128(H) 142(H)  AST 15 - 41 U/L 23 31 32  ALT 0 - 44 U/L 11 16 11        DIAGNOSTIC IMAGING:  I have independently reviewed the scans and discussed with the patient.   I have reviewed Venita Lick LPN's note and agree with the documentation.  I  personally performed a face-to-face visit, made revisions and my assessment and plan is as follows.    ASSESSMENT & PLAN:   Primary prostate cancer with metastasis from prostate to other site [facility]) 1.  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 - Zytiga and prednisone started in January 2019. -Last bone scan and CT scan on 04/27/2018 shows worsening bone metastasis with no visceral metastasis. - He self discontinue it secondary to side effects.  He was started back at low dose of 1 mg daily and discontinued it in August 2019 secondary to intolerance from nausea and severe tiredness.  -Bone marrow biopsy on 10/11/2018 consistent with metastatic adenocarcinoma infiltration. -UroLift procedure was done on 10/16/2018 which is helping his urine flow.  He does not have to  self catheterize anymore. -Enzalutamide was started on 10/22/2018, increased to 4 tablets daily, self discontinued in January 2020. - He apparently self discontinued enzalutamide in January 2020 as he is not tolerating it well.  His main complaint was feeling of nausea all the time.  He tried taking Compazine but did not help. -He was hospitalized from 12/28/2018 through 12/29/2018 and received 3 units of blood transfusion for hemoglobin of 5.2. - He was hospitalized again on 01/29/2019 through 01/30/2019 for hemoglobin of 7.2 and received 2 units of PRBC.  Hemoglobin at discharge was 8.9 last week. - His PSA came down from 254 (09/25/2018) to 153 (12/28/2018). - We started him back on [name] 22.5 mg at last visit on 02/06/2019. -MRI done on 02/19/2019 was motion degraded, prematurely truncated demonstrating multiple suspected tumor deposits in the epidural space over both cerebral hemispheres.  Base of skull was not visualized. -  Apalutamide was started on 02/19/2019 and discontinued on 02/28/2019 secondary to falls and severe weakness.  - CT of the head with and without contrast on 02/21/2019 shows multiple enhancing extra-axial masses bilaterally surrounding both cerebral hemispheres in the frontal and parietal and temporal lobes compatible with metastatic disease to the bone and dura.  Largest lesion in the left frontal lobe measures 20 x 19 mm.  No significant brain edema. -I discussed the results of the bone scan dated 03/08/2019 which showed widespread osseous metastatic disease involving axial and appendicular skeleton. - I had a prolonged discussion with the patient about his prognosis.  He is not able to take apalutamide.  He could not tolerate many of medicines. - He is complaining of lightheadedness when standing.  He has fallen one time since last visit.  His hemoglobin is 7.1.  Hence I would give him 1 unit of blood transfusion. - I talked to him at length about option of hospice.  He is agreeable.  We will call [facility] hospice. - I will see him back in 2 weeks for follow-up.  2.  Bone metastasis: -He received denosumab on 12/13/2018. -She will continue calcium and vitamin D supplements.   3.  Family history: - Sister died of metastatic cancer.  Primary is unknown to the patient.  Another sister died of colon cancer. -Testing for common hereditary mutations was negative.  4.  Cancer related pain: -He started palliative radiation to the left shoulder area on 03/08/2019. -He is taking Percocet 10 mg twice daily which is helping.  He is also wearing fentanyl 25 mcg patch.  He does report some constipation. - He is also taking dexamethasone 4 mg daily.    Total time spent is 40 minutes with more than 50% of the time spent face-to-face discussing scan results, prognosis, hospice options and coordination of care.    Orders placed this encounter:  No orders of the defined types were placed in this encounter.      [name], MD [place] [phone]

## 2019-03-12 NOTE — Assessment & Plan Note (Signed)
1.  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 - Zytiga and prednisone started in January 2019. -Last bone scan and CT scan on 04/27/2018 shows worsening bone metastasis with no visceral metastasis. - He self discontinue it secondary to side effects.  He was started back at low dose of 1 mg daily and discontinued it in August 2019 secondary to intolerance from nausea and severe tiredness.  -Bone marrow biopsy on 10/11/2018 consistent with metastatic adenocarcinoma infiltration. -UroLift procedure was done on 10/16/2018 which is helping his urine flow.  He does not have to self catheterize anymore. -Enzalutamide was started on 10/22/2018, increased to 4 tablets daily, self discontinued in January 2020. - He apparently self discontinued enzalutamide in January 2020 as he is not tolerating it well.  His main complaint was feeling of nausea all the time.  He tried taking Compazine but did not help. -He was hospitalized from 12/28/2018 through 12/29/2018 and received 3 units of blood transfusion for hemoglobin of 5.2. - He was hospitalized again on 01/29/2019 through 01/30/2019 for hemoglobin of 7.2 and received 2 units of PRBC.  Hemoglobin at discharge was 8.9 last week. - His PSA came down from 254 (09/25/2018) to 153 (12/28/2018). - We started him back on [name] 22.5 mg at last visit on 02/06/2019. -MRI done on 02/19/2019 was motion degraded, prematurely truncated demonstrating multiple suspected tumor deposits in the epidural space over both cerebral hemispheres.  Base of skull was not visualized. - Apalutamide was started on 02/19/2019 and discontinued on 02/28/2019 secondary to falls and severe weakness.  - CT of the head with and without contrast on 02/21/2019 shows multiple enhancing extra-axial masses  bilaterally surrounding both cerebral hemispheres in the frontal and parietal and temporal lobes compatible with metastatic disease to the bone and dura.  Largest lesion in the left frontal lobe measures 20 x 19 mm.  No significant brain edema. -I discussed the results of the bone scan dated 03/08/2019 which showed widespread osseous metastatic disease involving axial and appendicular skeleton. - I had a prolonged discussion with the patient about his prognosis.  He is not able to take apalutamide.  He could not tolerate many of medicines. - He is complaining of lightheadedness when standing.  He has fallen one time since last visit.  His hemoglobin is 7.1.  Hence I would give him 1 unit of blood transfusion. - I talked to him at length about option of hospice.  He is agreeable.  We will call [facility]. - I will see him back in 2 weeks for follow-up.  2.  Bone metastasis: -He received denosumab on 12/13/2018. -She will continue calcium and vitamin D supplements.   3.  Family history: - Sister died of metastatic cancer.  Primary is unknown to the patient.  Another sister died of colon cancer. -Testing for common hereditary mutations was negative.  4.  Cancer related pain: -He started palliative radiation to the left shoulder area on 03/08/2019. -He is taking Percocet 10 mg twice daily which is helping.  He is also wearing fentanyl 25 mcg patch.  He does report some constipation. - He is also taking dexamethasone 4 mg daily.   

## 2019-03-12 NOTE — Progress Notes (Signed)
sam

## 2019-03-13 ENCOUNTER — Encounter (HOSPITAL_COMMUNITY): Payer: Self-pay | Admitting: Lab

## 2019-03-13 DIAGNOSIS — C7951 Secondary malignant neoplasm of bone: Secondary | ICD-10-CM | POA: Diagnosis not present

## 2019-03-13 DIAGNOSIS — C61 Malignant neoplasm of prostate: Secondary | ICD-10-CM | POA: Diagnosis not present

## 2019-03-13 DIAGNOSIS — C7949 Secondary malignant neoplasm of other parts of nervous system: Secondary | ICD-10-CM | POA: Diagnosis not present

## 2019-03-13 DIAGNOSIS — Z510 Encounter for antineoplastic radiation therapy: Secondary | ICD-10-CM | POA: Diagnosis not present

## 2019-03-13 LAB — PREPARE RBC (CROSSMATCH)

## 2019-03-13 NOTE — Progress Notes (Unsigned)
Referral sent to Hospice.  Records faxed on 4/1 

## 2019-03-14 ENCOUNTER — Encounter (HOSPITAL_COMMUNITY): Payer: Self-pay

## 2019-03-14 ENCOUNTER — Inpatient Hospital Stay (HOSPITAL_COMMUNITY): Payer: Self-pay | Attending: Hematology

## 2019-03-14 ENCOUNTER — Other Ambulatory Visit: Payer: Self-pay

## 2019-03-14 DIAGNOSIS — C7951 Secondary malignant neoplasm of bone: Secondary | ICD-10-CM | POA: Diagnosis not present

## 2019-03-14 DIAGNOSIS — C7949 Secondary malignant neoplasm of other parts of nervous system: Secondary | ICD-10-CM | POA: Diagnosis not present

## 2019-03-14 DIAGNOSIS — C7931 Secondary malignant neoplasm of brain: Secondary | ICD-10-CM | POA: Diagnosis not present

## 2019-03-14 DIAGNOSIS — Z9221 Personal history of antineoplastic chemotherapy: Secondary | ICD-10-CM | POA: Insufficient documentation

## 2019-03-14 DIAGNOSIS — C61 Malignant neoplasm of prostate: Secondary | ICD-10-CM | POA: Diagnosis not present

## 2019-03-14 DIAGNOSIS — Z510 Encounter for antineoplastic radiation therapy: Secondary | ICD-10-CM | POA: Diagnosis not present

## 2019-03-14 DIAGNOSIS — D649 Anemia, unspecified: Secondary | ICD-10-CM

## 2019-03-14 DIAGNOSIS — Z79818 Long term (current) use of other agents affecting estrogen receptors and estrogen levels: Secondary | ICD-10-CM | POA: Insufficient documentation

## 2019-03-14 MED ORDER — SODIUM CHLORIDE 0.9% FLUSH: 10.0000 mL | INTRAVENOUS | Status: DC | PRN

## 2019-03-14 MED ORDER — DIPHENHYDRAMINE HCL 25 MG PO CAPS
25.0000 mg | ORAL_CAPSULE | Freq: Once | ORAL | Status: AC
  Administered 2019-03-14: 25 mg via ORAL
  Filled 2019-03-14: qty 1

## 2019-03-14 MED ORDER — ACETAMINOPHEN 325 MG PO TABS
650.0000 mg | ORAL_TABLET | Freq: Once | ORAL | Status: AC
  Administered 2019-03-14: 650 mg via ORAL
  Filled 2019-03-14: qty 2

## 2019-03-14 MED ORDER — SODIUM CHLORIDE 0.9% IV SOLUTION: 250.0000 mL | Freq: Once | INTRAVENOUS | Status: DC

## 2019-03-14 NOTE — Progress Notes (Signed)
Patient here today for blood transfusion. No new issues reported by patient today.   Treatment given per orders. Patient tolerated it well without problems. Vitals stable and discharged home from clinic ambulatory. Follow up as scheduled.

## 2019-03-15 LAB — TYPE AND SCREEN
ABO/RH(D): A NEG
Antibody Screen: NEGATIVE
Unit division: 0

## 2019-03-15 LAB — BPAM RBC
Blood Product Expiration Date: 202004132359
ISSUE DATE / TIME: 202004020856
Unit Type and Rh: 600

## 2019-03-18 ENCOUNTER — Ambulatory Visit (INDEPENDENT_AMBULATORY_CARE_PROVIDER_SITE_OTHER): Payer: Medicare HMO | Admitting: Family Medicine

## 2019-03-18 ENCOUNTER — Other Ambulatory Visit: Payer: Self-pay

## 2019-03-18 ENCOUNTER — Encounter: Payer: Self-pay | Admitting: Family Medicine

## 2019-03-18 DIAGNOSIS — C61 Malignant neoplasm of prostate: Secondary | ICD-10-CM

## 2019-03-18 DIAGNOSIS — C7951 Secondary malignant neoplasm of bone: Secondary | ICD-10-CM

## 2019-03-18 DIAGNOSIS — Z510 Encounter for antineoplastic radiation therapy: Secondary | ICD-10-CM | POA: Diagnosis not present

## 2019-03-18 DIAGNOSIS — C7949 Secondary malignant neoplasm of other parts of nervous system: Secondary | ICD-10-CM | POA: Diagnosis not present

## 2019-03-18 MED ORDER — OXYCODONE-ACETAMINOPHEN 10-325 MG PO TABS: 1.0000 | ORAL_TABLET | Freq: Four times a day (QID) | ORAL | 0 refills | Status: AC | PRN

## 2019-03-18 NOTE — Progress Notes (Signed)
Virtual Visit via telephone Note  I connected with Walter Boone. on 03/18/19 at 1546 by telephone and verified that I am speaking with the correct person using two identifiers. Walter Boone. is currently located at home and no other people are currently with her during visit. The provider, Fransisca Kaufmann Dettinger, MD is located in their office at time of visit.  Call ended at 1554  I discussed the limitations, risks, security and privacy concerns of performing an evaluation and management service by telephone and the availability of in person appointments. I also discussed with the patient that there may be a patient responsible charge related to this service. The patient expressed understanding and agreed to proceed.   History and Present Illness: Pain assessment: Cause of pain- bone pain in shoulders, ribs and spine Pain location- metastatic prostate cancer Pain on scale of 1-10- 2 Frequency- daily, worse at night What increases pain-running out  What makes pain Better-radiation treatments that he is going through and the oxycodone Effects on ADL -limits his sleep and sometimes his mobility because of the bone pain Any change in general medical condition-prostate cancer continues to spread although with radiation he thinks they have said it stalled it at least for now  Current opioids rx-oxycodone 10-3 25 mg every 6 hours as needed # meds rx-90 Effectiveness of current meds-helps him sleep and rest a lot better than the hydrocodone previously Adverse reactions form pain meds-none Morphine equivalent- 45, it appears patient was also given a fentanyl patch from his oncologist but only 5 they must be using it during the treatments  Pill count performed-No Last drug screen -not recently, cancer patient ( high risk q46m, moderate risk q59m, low risk yearly ) Urine drug screen today- No Was the [facility] reviewed-yes  If yes were their any concerning findings? -Patient has  gotten a prescription from his oncologist and from [place] but he seems to be not mixing them and we have discontinued some of the previous ones because they were not working  No flowsheet data found.   Pain contract signed on:   No diagnosis found.  Outpatient Encounter Medications as of 03/18/2019  Medication Sig  . apalutamide (ERLEADA) 60 MG tablet Take 4 tablets (240 mg total) by mouth daily. May be taken with or without food. Swallow tablets whole.  . Calcium-Magnesium-Vitamin D (CALCIUM 1200+D3 PO) Take 1 tablet by mouth daily.   . Cholecalciferol 50 MCG (2000 UT) CHEW Chew 1 tablet by mouth.  . denosumab (XGEVA) 120 MG/1.7ML SOLN injection Inject 120 mg into the skin once.  . diclofenac sodium (VOLTAREN) 1 % GEL Apply 2 g topically 4 (four) times daily.  . fentaNYL (DURAGESIC) 25 MCG/HR Place 1 patch onto the skin every 3 (three) days.  . fentaNYL (DURAGESIC) 25 MCG/HR Place onto the skin.  . ferrous sulfate (FERROUSUL) 325 (65 FE) MG tablet Take 1 tablet (325 mg total) by mouth daily with breakfast.  . HYDROcodone-acetaminophen (NORCO) 10-325 MG tablet Take 1 tablet by mouth every 8 (eight) hours as needed.  Marland Kitchen HYDROcodone-acetaminophen (NORCO) 10-325 MG tablet Take by mouth.  . Leuprolide Acetate ([name] IJ) Inject 1 application as directed every 6 (six) months.   . Menthol-Methyl Salicylate (CALYPXO) 1-74 % CREA Apply topically.  . Menthol-Methyl Salicylate (MUSCLE RUB) 10-15 % CREA Apply 1 application topically daily as needed for muscle pain.   . Nutritional Supplements (EQUATE PLUS) LIQD Take 1 Can by mouth 2 (two) times daily.  Marland Kitchen OVER  THE COUNTER MEDICATION See admin instructions. preggie pop drops - take 1 lozenge as needed.   Marland Kitchen oxyCODONE-acetaminophen (PERCOCET) 10-325 MG tablet Take 1 tablet by mouth every 6 (six) hours as needed for pain.  Marland Kitchen oxyCODONE-acetaminophen (PERCOCET) 10-325 MG tablet Take by mouth.  . prochlorperazine (COMPAZINE) 10 MG tablet Take 1 tablet (10 mg  total) by mouth every 6 (six) hours as needed for nausea or vomiting.  . prochlorperazine (COMPAZINE) 10 MG tablet Take by mouth.  . promethazine (PHENERGAN) 25 MG tablet Take 1 tablet (25 mg total) by mouth every 6 (six) hours as needed for nausea or vomiting.  . promethazine (PHENERGAN) 25 MG tablet Take by mouth.  Marland Kitchen scopolamine (TRANSDERM-SCOP) 1 MG/3DAYS Place 1 patch (1.5 mg total) onto the skin every 3 (three) days.  Marland Kitchen scopolamine (TRANSDERM-SCOP) 1 MG/3DAYS Place onto the skin.  Marland Kitchen traMADol (ULTRAM) 50 MG tablet Take 2 tablets (100 mg total) by mouth every 4 (four) hours as needed.   No facility-administered encounter medications on file as of 03/18/2019.     Review of Systems  Constitutional: Negative for chills and fever.  Respiratory: Negative for shortness of breath and wheezing.   Cardiovascular: Negative for chest pain and leg swelling.  Musculoskeletal: Positive for arthralgias, back pain and myalgias. Negative for gait problem.  Skin: Negative for color change and rash.  All other systems reviewed and are negative.   Observations/Objective: Patient sounds comfortable on the phone and not in any acute distress  Assessment and Plan: Problem List Items Addressed This Visit      Musculoskeletal and Integument   Prostate cancer metastatic to bone ([place]) - Primary   Relevant Medications   oxyCODONE-acetaminophen (PERCOCET) 10-325 MG tablet    Other Visit Diagnoses    Bony metastasis ([facility])       Relevant Medications   oxyCODONE-acetaminophen (PERCOCET) 10-325 MG tablet       Follow Up Instructions:  Follow-up in 4 weeks if needed for medication refill, because patient is a cancer patient and has metastatic bone cancer is a good candidate for having higher doses of the medication.   I discussed the assessment and treatment plan with the patient. The patient was provided an opportunity to ask questions and all were answered. The patient agreed with the plan and  demonstrated an understanding of the instructions.   The patient was advised to call back or seek an in-person evaluation if the symptoms worsen or if the condition fails to improve as anticipated.  The above assessment and management plan was discussed with the patient. The patient verbalized understanding of and has agreed to the management plan. Patient is aware to call the clinic if symptoms persist or worsen. Patient is aware when to return to the clinic for a follow-up visit. Patient educated on when it is appropriate to go to the emergency department.    I provided 8 minutes of non-face-to-face time during this encounter.    Worthy Rancher, MD

## 2019-03-19 DIAGNOSIS — C61 Malignant neoplasm of prostate: Secondary | ICD-10-CM | POA: Diagnosis not present

## 2019-03-19 DIAGNOSIS — C7951 Secondary malignant neoplasm of bone: Secondary | ICD-10-CM | POA: Diagnosis not present

## 2019-03-19 DIAGNOSIS — C7949 Secondary malignant neoplasm of other parts of nervous system: Secondary | ICD-10-CM | POA: Diagnosis not present

## 2019-03-19 DIAGNOSIS — Z510 Encounter for antineoplastic radiation therapy: Secondary | ICD-10-CM | POA: Diagnosis not present

## 2019-03-20 MED ORDER — FENTANYL 25 MCG/HR TD PT72: 1.0000 | MEDICATED_PATCH | TRANSDERMAL | 0 refills | Status: AC

## 2019-03-20 NOTE — Addendum Note (Signed)
Addended by: Glennie Isle on: 03/20/2019 02:07 PM   Modules accepted: Orders

## 2019-03-21 DIAGNOSIS — C61 Malignant neoplasm of prostate: Secondary | ICD-10-CM | POA: Diagnosis not present

## 2019-03-21 DIAGNOSIS — C7951 Secondary malignant neoplasm of bone: Secondary | ICD-10-CM | POA: Diagnosis not present

## 2019-03-21 DIAGNOSIS — C7949 Secondary malignant neoplasm of other parts of nervous system: Secondary | ICD-10-CM | POA: Diagnosis not present

## 2019-03-21 DIAGNOSIS — Z510 Encounter for antineoplastic radiation therapy: Secondary | ICD-10-CM | POA: Diagnosis not present

## 2019-03-29 ENCOUNTER — Other Ambulatory Visit: Payer: Self-pay | Admitting: Family Medicine

## 2019-04-01 ENCOUNTER — Other Ambulatory Visit (HOSPITAL_COMMUNITY): Payer: Self-pay

## 2019-04-01 DIAGNOSIS — C61 Malignant neoplasm of prostate: Secondary | ICD-10-CM

## 2019-04-02 ENCOUNTER — Ambulatory Visit (HOSPITAL_COMMUNITY): Payer: Self-pay | Admitting: Hematology

## 2019-04-02 ENCOUNTER — Encounter (HOSPITAL_COMMUNITY): Payer: Self-pay | Admitting: Hematology

## 2019-04-02 ENCOUNTER — Other Ambulatory Visit (HOSPITAL_COMMUNITY): Payer: Self-pay

## 2019-04-12 DEATH — deceased

## 2019-05-07 ENCOUNTER — Ambulatory Visit (HOSPITAL_COMMUNITY): Payer: Self-pay

## 2019-10-02 NOTE — Progress Notes (Signed)
This encounter was created in error - please disregard.

## 2020-10-02 IMAGING — DX DG CHEST 2V
2 series · 2 of 2 positions shown · non-contrast
Comparison: 04/27/2018 bone scan. 04/26/2018 CT chest. 01/28/2018
chest radiograph.

CLINICAL DATA: 67 y/o M; nausea and weakness. History of prostate
cancer.

EXAM:
CHEST - 2 VIEW

[chest pa]
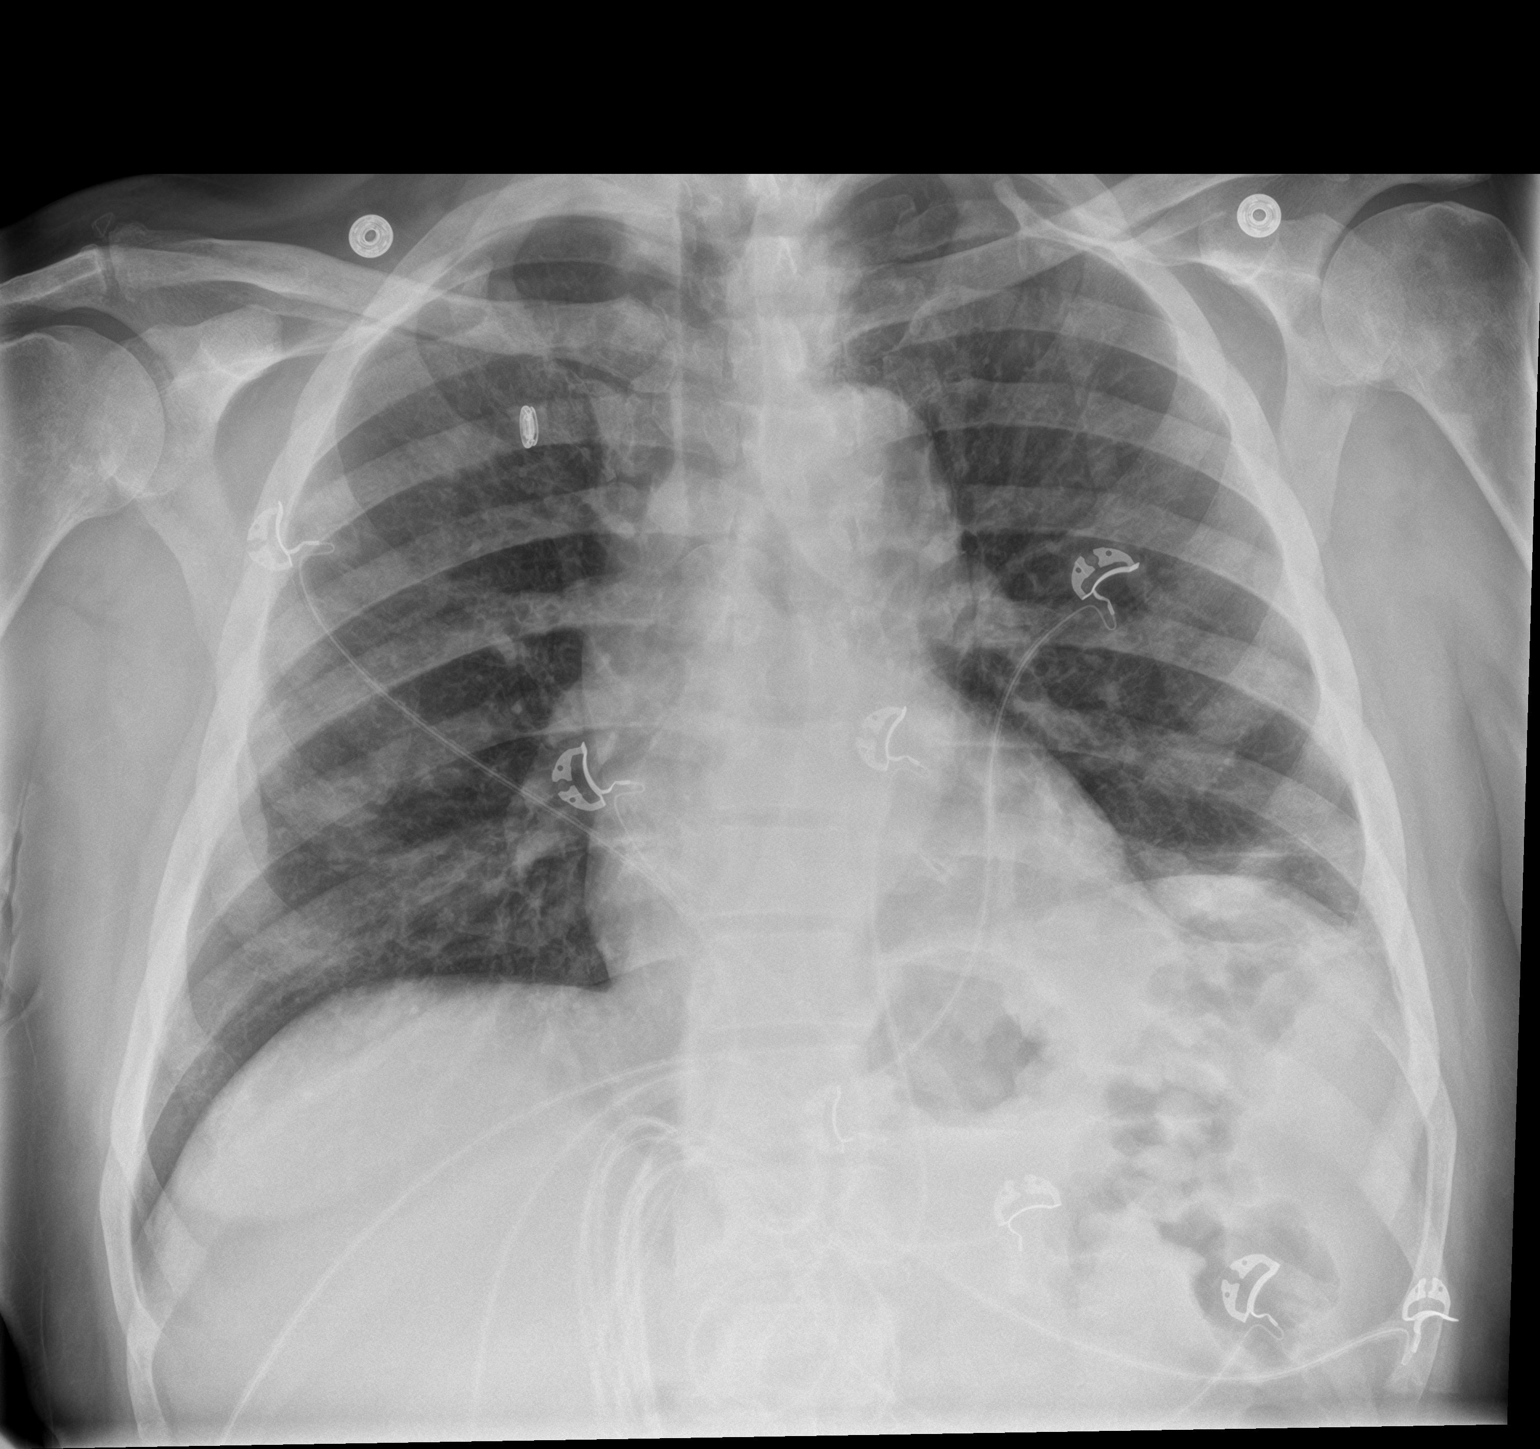

[chest lat]
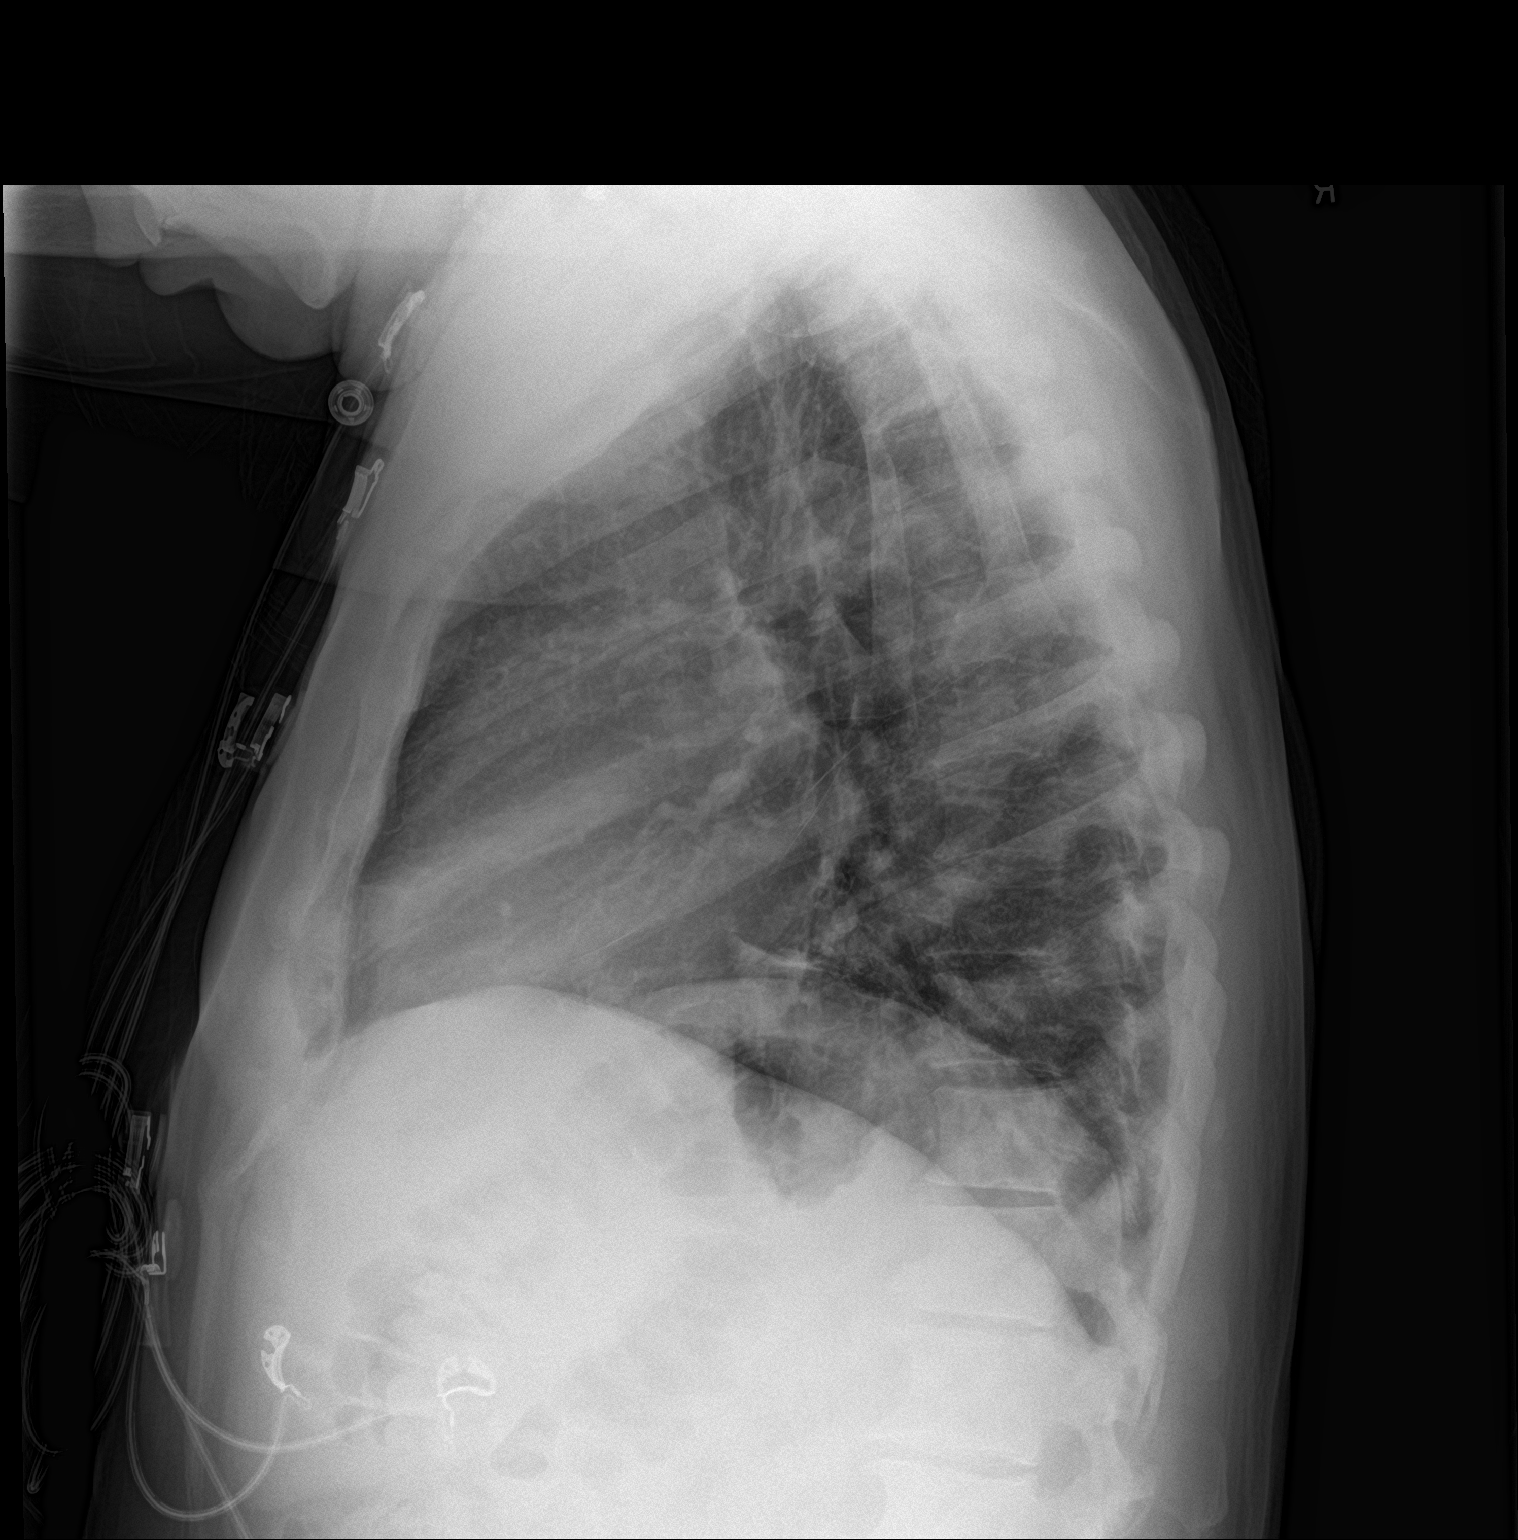

[2 of 2 positions shown; findings below may reference images not displayed]

FINDINGS: Stable normal cardiac silhouette. Clear lungs. No pleural effusion
or pneumothorax. No acute osseous abnormality is evident. Diffuse
sclerotic bony metastasis.
IMPRESSION: No acute pulmonary process identified.
